# Patient Record
Sex: Female | Born: 1960
Health system: Southern US, Community
[De-identification: ages and names within clinical notes are randomized; demographics above are authoritative.]

## PROBLEM LIST (undated history)

## (undated) DIAGNOSIS — Z87898 Personal history of other specified conditions: Secondary | ICD-10-CM

## (undated) DIAGNOSIS — I10 Essential (primary) hypertension: Secondary | ICD-10-CM

## (undated) DIAGNOSIS — F32A Depression, unspecified: Secondary | ICD-10-CM

## (undated) DIAGNOSIS — F329 Major depressive disorder, single episode, unspecified: Secondary | ICD-10-CM

## (undated) DIAGNOSIS — D649 Anemia, unspecified: Secondary | ICD-10-CM

## (undated) DIAGNOSIS — Y843 Shock therapy as the cause of abnormal reaction of the patient, or of later complication, without mention of misadventure at the time of the procedure: Secondary | ICD-10-CM

## (undated) DIAGNOSIS — T7840XA Allergy, unspecified, initial encounter: Secondary | ICD-10-CM

## (undated) DIAGNOSIS — G709 Myoneural disorder, unspecified: Secondary | ICD-10-CM

## (undated) DIAGNOSIS — F419 Anxiety disorder, unspecified: Secondary | ICD-10-CM

## (undated) DIAGNOSIS — K219 Gastro-esophageal reflux disease without esophagitis: Secondary | ICD-10-CM

## (undated) DIAGNOSIS — J45909 Unspecified asthma, uncomplicated: Secondary | ICD-10-CM

## (undated) DIAGNOSIS — E785 Hyperlipidemia, unspecified: Secondary | ICD-10-CM

## (undated) DIAGNOSIS — F4310 Post-traumatic stress disorder, unspecified: Secondary | ICD-10-CM

## (undated) DIAGNOSIS — K5792 Diverticulitis of intestine, part unspecified, without perforation or abscess without bleeding: Secondary | ICD-10-CM

## (undated) HISTORY — PX: OTHER SURGICAL HISTORY: SHX169

## (undated) HISTORY — DX: Essential (primary) hypertension: I10

## (undated) HISTORY — DX: Myoneural disorder, unspecified: G70.9

## (undated) HISTORY — DX: Personal history of other specified conditions: Z87.898

## (undated) HISTORY — DX: Diverticulitis of intestine, part unspecified, without perforation or abscess without bleeding: K57.92

## (undated) HISTORY — PX: CHOLECYSTECTOMY: SHX55

## (undated) HISTORY — DX: Anemia, unspecified: D64.9

## (undated) HISTORY — DX: Gastro-esophageal reflux disease without esophagitis: K21.9

## (undated) HISTORY — DX: Depression, unspecified: F32.A

## (undated) HISTORY — PX: ABDOMINAL HYSTERECTOMY: SHX81

## (undated) HISTORY — PX: COLONOSCOPY: SHX174

## (undated) HISTORY — DX: Hyperlipidemia, unspecified: E78.5

## (undated) HISTORY — DX: Anxiety disorder, unspecified: F41.9

## (undated) HISTORY — DX: Shock therapy as the cause of abnormal reaction of the patient, or of later complication, without mention of misadventure at the time of the procedure: Y84.3

## (undated) HISTORY — PX: UTERINE FIBROID SURGERY: SHX826

## (undated) HISTORY — PX: COLECTOMY: SHX59

## (undated) HISTORY — DX: Post-traumatic stress disorder, unspecified: F43.10

## (undated) HISTORY — DX: Unspecified asthma, uncomplicated: J45.909

## (undated) HISTORY — DX: Allergy, unspecified, initial encounter: T78.40XA

## (undated) HISTORY — DX: Major depressive disorder, single episode, unspecified: F32.9

## (undated) HISTORY — PX: UPPER GASTROINTESTINAL ENDOSCOPY: SHX188

## (undated) HISTORY — PX: TUBAL LIGATION: SHX77

---

## 1995-07-30 ENCOUNTER — Encounter: Payer: Self-pay | Admitting: Gastroenterology

## 1995-08-02 ENCOUNTER — Encounter: Payer: Self-pay | Admitting: Internal Medicine

## 1997-12-28 ENCOUNTER — Other Ambulatory Visit: Admission: RE | Admit: 1997-12-28 | Discharge: 1997-12-28 | Payer: Self-pay | Admitting: Obstetrics and Gynecology

## 1998-02-16 ENCOUNTER — Other Ambulatory Visit: Admission: RE | Admit: 1998-02-16 | Discharge: 1998-02-16 | Payer: Self-pay | Admitting: *Deleted

## 1998-06-21 ENCOUNTER — Other Ambulatory Visit: Admission: RE | Admit: 1998-06-21 | Discharge: 1998-06-21 | Payer: Self-pay | Admitting: Obstetrics and Gynecology

## 1999-04-18 ENCOUNTER — Encounter: Payer: Self-pay | Admitting: Emergency Medicine

## 1999-04-18 ENCOUNTER — Emergency Department (HOSPITAL_COMMUNITY): Admission: EM | Admit: 1999-04-18 | Discharge: 1999-04-18 | Payer: Self-pay | Admitting: Emergency Medicine

## 1999-08-14 ENCOUNTER — Encounter: Payer: Self-pay | Admitting: Internal Medicine

## 1999-08-31 ENCOUNTER — Encounter: Payer: Self-pay | Admitting: Internal Medicine

## 1999-08-31 ENCOUNTER — Ambulatory Visit (HOSPITAL_COMMUNITY): Admission: RE | Admit: 1999-08-31 | Discharge: 1999-08-31 | Payer: Self-pay | Admitting: Internal Medicine

## 1999-11-06 ENCOUNTER — Encounter: Payer: Self-pay | Admitting: Internal Medicine

## 1999-11-24 ENCOUNTER — Other Ambulatory Visit: Admission: RE | Admit: 1999-11-24 | Discharge: 1999-11-24 | Payer: Self-pay | Admitting: Obstetrics and Gynecology

## 2000-04-22 ENCOUNTER — Inpatient Hospital Stay (HOSPITAL_COMMUNITY): Admission: EM | Admit: 2000-04-22 | Discharge: 2000-04-24 | Payer: Self-pay | Admitting: *Deleted

## 2000-12-19 ENCOUNTER — Other Ambulatory Visit: Admission: RE | Admit: 2000-12-19 | Discharge: 2000-12-19 | Payer: Self-pay | Admitting: *Deleted

## 2001-04-23 ENCOUNTER — Ambulatory Visit (HOSPITAL_COMMUNITY): Admission: RE | Admit: 2001-04-23 | Discharge: 2001-04-23 | Payer: Self-pay

## 2002-09-24 ENCOUNTER — Encounter: Payer: Self-pay | Admitting: Internal Medicine

## 2002-10-19 ENCOUNTER — Encounter: Payer: Self-pay | Admitting: Obstetrics and Gynecology

## 2002-10-19 ENCOUNTER — Encounter: Admission: RE | Admit: 2002-10-19 | Discharge: 2002-10-19 | Payer: Self-pay | Admitting: Obstetrics and Gynecology

## 2002-10-19 ENCOUNTER — Other Ambulatory Visit: Admission: RE | Admit: 2002-10-19 | Discharge: 2002-10-19 | Payer: Self-pay | Admitting: Obstetrics and Gynecology

## 2002-10-27 ENCOUNTER — Encounter: Payer: Self-pay | Admitting: Internal Medicine

## 2003-01-13 ENCOUNTER — Emergency Department (HOSPITAL_COMMUNITY): Admission: EM | Admit: 2003-01-13 | Discharge: 2003-01-13 | Payer: Self-pay | Admitting: Emergency Medicine

## 2003-02-17 ENCOUNTER — Inpatient Hospital Stay (HOSPITAL_COMMUNITY): Admission: AD | Admit: 2003-02-17 | Discharge: 2003-02-19 | Payer: Self-pay | Admitting: Obstetrics and Gynecology

## 2003-02-17 ENCOUNTER — Encounter (INDEPENDENT_AMBULATORY_CARE_PROVIDER_SITE_OTHER): Payer: Self-pay

## 2003-07-05 ENCOUNTER — Ambulatory Visit (HOSPITAL_COMMUNITY): Admission: RE | Admit: 2003-07-05 | Discharge: 2003-07-05 | Payer: Self-pay | Admitting: Internal Medicine

## 2003-08-12 ENCOUNTER — Encounter (INDEPENDENT_AMBULATORY_CARE_PROVIDER_SITE_OTHER): Payer: Self-pay | Admitting: *Deleted

## 2003-08-12 ENCOUNTER — Ambulatory Visit (HOSPITAL_COMMUNITY): Admission: RE | Admit: 2003-08-12 | Discharge: 2003-08-12 | Payer: Self-pay | Admitting: Internal Medicine

## 2003-08-14 ENCOUNTER — Emergency Department (HOSPITAL_COMMUNITY): Admission: EM | Admit: 2003-08-14 | Discharge: 2003-08-15 | Payer: Self-pay | Admitting: Emergency Medicine

## 2003-11-17 ENCOUNTER — Encounter: Payer: Self-pay | Admitting: Internal Medicine

## 2003-12-31 ENCOUNTER — Encounter: Admission: RE | Admit: 2003-12-31 | Discharge: 2003-12-31 | Payer: Self-pay | Admitting: Obstetrics and Gynecology

## 2004-08-09 ENCOUNTER — Ambulatory Visit: Payer: Self-pay | Admitting: Internal Medicine

## 2004-09-27 ENCOUNTER — Ambulatory Visit: Payer: Self-pay | Admitting: Internal Medicine

## 2004-10-04 ENCOUNTER — Ambulatory Visit: Payer: Self-pay | Admitting: Endocrinology

## 2004-10-16 ENCOUNTER — Ambulatory Visit: Payer: Self-pay | Admitting: Endocrinology

## 2004-11-11 ENCOUNTER — Ambulatory Visit: Payer: Self-pay | Admitting: Internal Medicine

## 2004-11-11 ENCOUNTER — Observation Stay (HOSPITAL_COMMUNITY): Admission: EM | Admit: 2004-11-11 | Discharge: 2004-11-12 | Payer: Self-pay | Admitting: Emergency Medicine

## 2004-11-12 ENCOUNTER — Ambulatory Visit: Payer: Self-pay | Admitting: Cardiology

## 2004-11-29 ENCOUNTER — Ambulatory Visit: Payer: Self-pay | Admitting: Endocrinology

## 2004-12-04 ENCOUNTER — Ambulatory Visit: Payer: Self-pay | Admitting: Endocrinology

## 2004-12-08 ENCOUNTER — Ambulatory Visit (HOSPITAL_COMMUNITY): Admission: RE | Admit: 2004-12-08 | Discharge: 2004-12-08 | Payer: Self-pay | Admitting: Endocrinology

## 2005-04-13 ENCOUNTER — Encounter: Admission: RE | Admit: 2005-04-13 | Discharge: 2005-04-13 | Payer: Self-pay | Admitting: Obstetrics and Gynecology

## 2005-07-23 ENCOUNTER — Ambulatory Visit: Payer: Self-pay | Admitting: Endocrinology

## 2005-10-12 ENCOUNTER — Encounter (INDEPENDENT_AMBULATORY_CARE_PROVIDER_SITE_OTHER): Payer: Self-pay | Admitting: *Deleted

## 2005-10-12 ENCOUNTER — Ambulatory Visit: Payer: Self-pay | Admitting: Cardiology

## 2005-10-12 ENCOUNTER — Ambulatory Visit: Payer: Self-pay | Admitting: Endocrinology

## 2006-05-07 ENCOUNTER — Ambulatory Visit: Payer: Self-pay | Admitting: *Deleted

## 2006-05-07 ENCOUNTER — Ambulatory Visit: Payer: Self-pay | Admitting: Endocrinology

## 2006-08-26 ENCOUNTER — Ambulatory Visit: Payer: Self-pay | Admitting: Endocrinology

## 2006-08-26 LAB — CONVERTED CEMR LAB
AST: 21 units/L (ref 0–37)
Albumin: 3.8 g/dL (ref 3.5–5.2)
Basophils Absolute: 0.1 10*3/uL (ref 0.0–0.1)
Bilirubin, Direct: 0.1 mg/dL (ref 0.0–0.3)
Calcium: 10.2 mg/dL (ref 8.4–10.5)
Chloride: 102 meq/L (ref 96–112)
Cholesterol: 254 mg/dL (ref 0–200)
Direct LDL: 125.9 mg/dL
Eosinophils Absolute: 0.4 10*3/uL (ref 0.0–0.6)
GFR calc Af Amer: 77 mL/min
GFR calc non Af Amer: 64 mL/min
Glucose, Bld: 96 mg/dL (ref 70–99)
HDL: 45.6 mg/dL (ref >39.0)
Hemoglobin, Urine: NEGATIVE
Ketones, ur: NEGATIVE mg/dL
Lymphocytes Relative: 27.1 % (ref 12.0–46.0)
MCHC: 35.7 g/dL (ref 30.0–36.0)
MCV: 82.2 fL (ref 78.0–100.0)
Neutro Abs: 6.6 10*3/uL (ref 1.4–7.7)
Neutrophils Relative %: 62.6 % (ref 43.0–77.0)
Platelets: 321 10*3/uL (ref 150–400)
RBC: 4.65 M/uL (ref 3.87–5.11)
Sodium: 141 meq/L (ref 135–145)
Total CHOL/HDL Ratio: 5.6
Triglycerides: 566 mg/dL (ref 0–149)
Urine Glucose: NEGATIVE mg/dL

## 2007-01-07 ENCOUNTER — Ambulatory Visit (HOSPITAL_COMMUNITY): Admission: RE | Admit: 2007-01-07 | Discharge: 2007-01-07 | Payer: Self-pay | Admitting: Obstetrics and Gynecology

## 2007-01-13 ENCOUNTER — Encounter: Admission: RE | Admit: 2007-01-13 | Discharge: 2007-01-13 | Payer: Self-pay | Admitting: Obstetrics and Gynecology

## 2007-02-04 ENCOUNTER — Ambulatory Visit: Payer: Self-pay | Admitting: Endocrinology

## 2007-02-10 ENCOUNTER — Ambulatory Visit: Payer: Self-pay | Admitting: Endocrinology

## 2007-02-10 ENCOUNTER — Encounter: Admission: RE | Admit: 2007-02-10 | Discharge: 2007-02-10 | Payer: Self-pay | Admitting: Endocrinology

## 2007-02-10 LAB — CONVERTED CEMR LAB
BUN: 11 mg/dL (ref 6–23)
Creatinine, Ser: 1 mg/dL (ref 0.4–1.2)

## 2007-02-12 ENCOUNTER — Encounter: Admission: RE | Admit: 2007-02-12 | Discharge: 2007-02-12 | Payer: Self-pay | Admitting: Endocrinology

## 2007-02-13 ENCOUNTER — Ambulatory Visit: Payer: Self-pay | Admitting: Endocrinology

## 2007-02-14 ENCOUNTER — Encounter: Payer: Self-pay | Admitting: Endocrinology

## 2007-07-22 ENCOUNTER — Encounter: Admission: RE | Admit: 2007-07-22 | Discharge: 2007-07-22 | Payer: Self-pay | Admitting: Obstetrics and Gynecology

## 2007-08-27 ENCOUNTER — Other Ambulatory Visit (HOSPITAL_COMMUNITY): Admission: RE | Admit: 2007-08-27 | Discharge: 2007-09-12 | Payer: Self-pay | Admitting: Psychiatry

## 2007-08-28 ENCOUNTER — Ambulatory Visit: Payer: Self-pay | Admitting: Psychiatry

## 2007-09-17 ENCOUNTER — Ambulatory Visit: Payer: Self-pay | Admitting: Endocrinology

## 2007-09-17 DIAGNOSIS — F329 Major depressive disorder, single episode, unspecified: Secondary | ICD-10-CM

## 2007-09-17 DIAGNOSIS — K219 Gastro-esophageal reflux disease without esophagitis: Secondary | ICD-10-CM

## 2007-09-17 DIAGNOSIS — I10 Essential (primary) hypertension: Secondary | ICD-10-CM | POA: Insufficient documentation

## 2007-09-17 DIAGNOSIS — E669 Obesity, unspecified: Secondary | ICD-10-CM

## 2007-09-17 DIAGNOSIS — F419 Anxiety disorder, unspecified: Secondary | ICD-10-CM

## 2007-09-17 DIAGNOSIS — L708 Other acne: Secondary | ICD-10-CM

## 2007-09-17 DIAGNOSIS — E785 Hyperlipidemia, unspecified: Secondary | ICD-10-CM | POA: Insufficient documentation

## 2007-09-17 LAB — CONVERTED CEMR LAB
AST: 23 units/L (ref 0–37)
Basophils Absolute: 0.1 10*3/uL (ref 0.0–0.1)
Bilirubin, Direct: 0.2 mg/dL (ref 0.0–0.3)
Chloride: 105 meq/L (ref 96–112)
Cholesterol: 181 mg/dL (ref 0–200)
Creatinine, Ser: 0.9 mg/dL (ref 0.4–1.2)
Direct LDL: 76.1 mg/dL
Eosinophils Absolute: 0.3 10*3/uL (ref 0.0–0.6)
Eosinophils Relative: 3.7 % (ref 0.0–5.0)
GFR calc non Af Amer: 72 mL/min
Glucose, Bld: 98 mg/dL (ref 70–99)
HCT: 38.1 % (ref 36.0–46.0)
Hemoglobin, Urine: NEGATIVE
Hemoglobin: 13 g/dL (ref 12.0–15.0)
Ketones, ur: NEGATIVE mg/dL
Lymphocytes Relative: 33.5 % (ref 12.0–46.0)
MCHC: 34 g/dL (ref 30.0–36.0)
MCV: 84.1 fL (ref 78.0–100.0)
Monocytes Absolute: 0.4 10*3/uL (ref 0.2–0.7)
Mucus, UA: NEGATIVE
Neutrophils Relative %: 56.5 % (ref 43.0–77.0)
Nitrite: NEGATIVE
Potassium: 4.1 meq/L (ref 3.5–5.1)
RBC: 4.54 M/uL (ref 3.87–5.11)
Sodium: 139 meq/L (ref 135–145)
TSH: 0.65 microintl units/mL (ref 0.35–5.50)
Total Bilirubin: 1.5 mg/dL — ABNORMAL HIGH (ref 0.3–1.2)
Total CHOL/HDL Ratio: 4.9
Urobilinogen, UA: 0.2 (ref 0.0–1.0)
WBC: 8.3 10*3/uL (ref 4.5–10.5)

## 2007-09-26 ENCOUNTER — Encounter: Payer: Self-pay | Admitting: Endocrinology

## 2007-10-03 ENCOUNTER — Ambulatory Visit: Payer: Self-pay | Admitting: Endocrinology

## 2007-11-05 ENCOUNTER — Telehealth (INDEPENDENT_AMBULATORY_CARE_PROVIDER_SITE_OTHER): Payer: Self-pay | Admitting: *Deleted

## 2007-11-06 ENCOUNTER — Encounter: Payer: Self-pay | Admitting: Endocrinology

## 2008-03-24 ENCOUNTER — Telehealth: Payer: Self-pay | Admitting: Internal Medicine

## 2008-04-14 ENCOUNTER — Ambulatory Visit: Payer: Self-pay | Admitting: Internal Medicine

## 2008-04-14 DIAGNOSIS — J309 Allergic rhinitis, unspecified: Secondary | ICD-10-CM | POA: Insufficient documentation

## 2008-04-14 DIAGNOSIS — H9190 Unspecified hearing loss, unspecified ear: Secondary | ICD-10-CM | POA: Insufficient documentation

## 2008-04-20 ENCOUNTER — Encounter: Admission: RE | Admit: 2008-04-20 | Discharge: 2008-04-20 | Payer: Self-pay | Admitting: Obstetrics and Gynecology

## 2008-05-03 ENCOUNTER — Telehealth: Payer: Self-pay | Admitting: Internal Medicine

## 2008-08-23 ENCOUNTER — Telehealth (INDEPENDENT_AMBULATORY_CARE_PROVIDER_SITE_OTHER): Payer: Self-pay | Admitting: *Deleted

## 2008-10-14 ENCOUNTER — Telehealth: Payer: Self-pay | Admitting: Endocrinology

## 2008-11-18 ENCOUNTER — Ambulatory Visit: Payer: Self-pay | Admitting: Endocrinology

## 2008-11-23 ENCOUNTER — Ambulatory Visit: Payer: Self-pay | Admitting: Endocrinology

## 2008-12-07 LAB — CONVERTED CEMR LAB
AST: 31 units/L (ref 0–37)
Albumin: 4.1 g/dL (ref 3.5–5.2)
BUN: 13 mg/dL (ref 6–23)
Basophils Absolute: 0.1 10*3/uL (ref 0.0–0.1)
CO2: 29 meq/L (ref 19–32)
Direct LDL: 80.2 mg/dL
Eosinophils Absolute: 0.5 10*3/uL (ref 0.0–0.7)
Glucose, Bld: 101 mg/dL — ABNORMAL HIGH (ref 70–99)
HCT: 37.5 % (ref 36.0–46.0)
Hemoglobin: 13.1 g/dL (ref 12.0–15.0)
Lymphs Abs: 3.2 10*3/uL (ref 0.7–4.0)
MCHC: 34.9 g/dL (ref 30.0–36.0)
MCV: 87.6 fL (ref 78.0–100.0)
Monocytes Absolute: 0.7 10*3/uL (ref 0.1–1.0)
Neutro Abs: 6.3 10*3/uL (ref 1.4–7.7)
Potassium: 3.2 meq/L — ABNORMAL LOW (ref 3.5–5.1)
RDW: 12.6 % (ref 11.5–14.6)
Sodium: 139 meq/L (ref 135–145)
TSH: 2.27 microintl units/mL (ref 0.35–5.50)

## 2008-12-11 LAB — CONVERTED CEMR LAB
Hemoglobin, Urine: NEGATIVE
Urine Glucose: NEGATIVE mg/dL
Urobilinogen, UA: 0.2 (ref 0.0–1.0)

## 2008-12-27 ENCOUNTER — Ambulatory Visit: Payer: Self-pay | Admitting: Endocrinology

## 2009-03-21 ENCOUNTER — Telehealth: Payer: Self-pay | Admitting: Endocrinology

## 2009-07-07 ENCOUNTER — Telehealth: Payer: Self-pay | Admitting: Internal Medicine

## 2009-10-06 ENCOUNTER — Telehealth: Payer: Self-pay | Admitting: Internal Medicine

## 2009-12-02 ENCOUNTER — Encounter: Payer: Self-pay | Admitting: Endocrinology

## 2009-12-20 ENCOUNTER — Telehealth: Payer: Self-pay | Admitting: Internal Medicine

## 2009-12-29 ENCOUNTER — Encounter: Admission: RE | Admit: 2009-12-29 | Discharge: 2009-12-29 | Payer: Self-pay | Admitting: Obstetrics and Gynecology

## 2010-01-12 ENCOUNTER — Ambulatory Visit: Payer: Self-pay | Admitting: Endocrinology

## 2010-02-16 ENCOUNTER — Ambulatory Visit: Payer: Self-pay | Admitting: Internal Medicine

## 2010-02-16 ENCOUNTER — Telehealth: Payer: Self-pay | Admitting: Internal Medicine

## 2010-02-16 LAB — CONVERTED CEMR LAB
Alkaline Phosphatase: 33 units/L — ABNORMAL LOW (ref 39–117)
BUN: 14 mg/dL (ref 6–23)
Basophils Absolute: 0.1 10*3/uL (ref 0.0–0.1)
Bilirubin, Direct: 0.3 mg/dL (ref 0.0–0.3)
CO2: 28 meq/L (ref 19–32)
Calcium: 11.6 mg/dL — ABNORMAL HIGH (ref 8.4–10.5)
Chloride: 100 meq/L (ref 96–112)
Creatinine, Ser: 1.3 mg/dL — ABNORMAL HIGH (ref 0.4–1.2)
Eosinophils Absolute: 0.2 10*3/uL (ref 0.0–0.7)
Glucose, Bld: 104 mg/dL — ABNORMAL HIGH (ref 70–99)
Hemoglobin, Urine: NEGATIVE
Lymphocytes Relative: 22.9 % (ref 12.0–46.0)
MCHC: 34.5 g/dL (ref 30.0–36.0)
MCV: 87.8 fL (ref 78.0–100.0)
Monocytes Absolute: 1 10*3/uL (ref 0.1–1.0)
Neutrophils Relative %: 69.5 % (ref 43.0–77.0)
Nitrite: NEGATIVE
Platelets: 465 10*3/uL — ABNORMAL HIGH (ref 150.0–400.0)
RDW: 13.4 % (ref 11.5–14.6)
Specific Gravity, Urine: 1.02 (ref 1.000–1.030)
Total Protein: 8 g/dL (ref 6.0–8.3)
Urine Glucose: NEGATIVE mg/dL
Urobilinogen, UA: 0.2 (ref 0.0–1.0)

## 2010-03-13 ENCOUNTER — Ambulatory Visit: Payer: Self-pay | Admitting: Endocrinology

## 2010-03-13 DIAGNOSIS — M25519 Pain in unspecified shoulder: Secondary | ICD-10-CM | POA: Insufficient documentation

## 2010-03-13 LAB — CONVERTED CEMR LAB
Basophils Absolute: 0.2 10*3/uL — ABNORMAL HIGH (ref 0.0–0.1)
Eosinophils Absolute: 0.4 10*3/uL (ref 0.0–0.7)
HCT: 37.8 % (ref 36.0–46.0)
Hemoglobin: 12.9 g/dL (ref 12.0–15.0)
Lymphs Abs: 2.1 10*3/uL (ref 0.7–4.0)
MCHC: 34.3 g/dL (ref 30.0–36.0)
Monocytes Absolute: 0.7 10*3/uL (ref 0.1–1.0)
Neutro Abs: 9 10*3/uL — ABNORMAL HIGH (ref 1.4–7.7)
Platelets: 407 10*3/uL — ABNORMAL HIGH (ref 150.0–400.0)
RDW: 12.8 % (ref 11.5–14.6)
Sed Rate: 10 mm/hr (ref 0–22)
Total CK: 61 units/L (ref 7–177)

## 2010-04-10 ENCOUNTER — Ambulatory Visit: Payer: Self-pay | Admitting: Endocrinology

## 2010-04-11 ENCOUNTER — Ambulatory Visit: Payer: Self-pay | Admitting: Endocrinology

## 2010-04-12 LAB — CONVERTED CEMR LAB
BUN: 18 mg/dL (ref 6–23)
CO2: 28 meq/L (ref 19–32)
Calcium: 10.1 mg/dL (ref 8.4–10.5)
Creatinine, Ser: 1.4 mg/dL — ABNORMAL HIGH (ref 0.4–1.2)
GFR calc non Af Amer: 43 mL/min (ref >60)
Glucose, Bld: 101 mg/dL — ABNORMAL HIGH (ref 70–99)

## 2010-04-24 ENCOUNTER — Ambulatory Visit: Payer: Self-pay | Admitting: Endocrinology

## 2010-04-28 ENCOUNTER — Telehealth: Payer: Self-pay | Admitting: Internal Medicine

## 2010-05-12 ENCOUNTER — Telehealth: Payer: Self-pay | Admitting: Endocrinology

## 2010-05-29 ENCOUNTER — Telehealth: Payer: Self-pay | Admitting: Internal Medicine

## 2010-05-29 ENCOUNTER — Encounter (INDEPENDENT_AMBULATORY_CARE_PROVIDER_SITE_OTHER): Payer: Self-pay | Admitting: *Deleted

## 2010-05-30 ENCOUNTER — Telehealth (INDEPENDENT_AMBULATORY_CARE_PROVIDER_SITE_OTHER): Payer: Self-pay | Admitting: *Deleted

## 2010-05-30 ENCOUNTER — Ambulatory Visit: Payer: Self-pay | Admitting: Gastroenterology

## 2010-05-30 DIAGNOSIS — K644 Residual hemorrhoidal skin tags: Secondary | ICD-10-CM | POA: Insufficient documentation

## 2010-05-31 ENCOUNTER — Telehealth: Payer: Self-pay | Admitting: Nurse Practitioner

## 2010-06-06 ENCOUNTER — Telehealth (INDEPENDENT_AMBULATORY_CARE_PROVIDER_SITE_OTHER): Payer: Self-pay | Admitting: *Deleted

## 2010-06-18 ENCOUNTER — Encounter: Payer: Self-pay | Admitting: Endocrinology

## 2010-06-23 ENCOUNTER — Telehealth: Payer: Self-pay | Admitting: Internal Medicine

## 2010-06-28 ENCOUNTER — Encounter (INDEPENDENT_AMBULATORY_CARE_PROVIDER_SITE_OTHER): Payer: Self-pay | Admitting: *Deleted

## 2010-06-29 ENCOUNTER — Telehealth: Payer: Self-pay | Admitting: Endocrinology

## 2010-07-05 ENCOUNTER — Telehealth: Payer: Self-pay | Admitting: Internal Medicine

## 2010-07-06 ENCOUNTER — Telehealth: Payer: Self-pay | Admitting: Internal Medicine

## 2010-07-22 ENCOUNTER — Encounter: Payer: Self-pay | Admitting: Internal Medicine

## 2010-07-23 ENCOUNTER — Encounter: Payer: Self-pay | Admitting: Endocrinology

## 2010-07-30 LAB — CONVERTED CEMR LAB
BUN: 16 mg/dL (ref 6–23)
Basophils Relative: 0.8 % (ref 0.0–3.0)
Bilirubin, Direct: 0.2 mg/dL (ref 0.0–0.3)
CO2: 28 meq/L (ref 19–32)
Chloride: 102 meq/L (ref 96–112)
Cholesterol: 218 mg/dL — ABNORMAL HIGH (ref 0–200)
Creatinine, Ser: 1.1 mg/dL (ref 0.4–1.2)
Direct LDL: 94.2 mg/dL
Eosinophils Absolute: 0.5 10*3/uL (ref 0.0–0.7)
Hemoglobin, Urine: NEGATIVE
Lymphs Abs: 3.4 10*3/uL (ref 0.7–4.0)
MCHC: 34.9 g/dL (ref 30.0–36.0)
MCV: 85.9 fL (ref 78.0–100.0)
Monocytes Absolute: 0.7 10*3/uL (ref 0.1–1.0)
Neutrophils Relative %: 59.3 % (ref 43.0–77.0)
Nitrite: NEGATIVE
Platelets: 352 10*3/uL (ref 150.0–400.0)
TSH: 1.46 microintl units/mL (ref 0.35–5.50)
Total Protein, Urine: NEGATIVE mg/dL
Total Protein: 7.5 g/dL (ref 6.0–8.3)
Urobilinogen, UA: 0.2 (ref 0.0–1.0)

## 2010-08-01 ENCOUNTER — Telehealth: Payer: Self-pay | Admitting: Endocrinology

## 2010-08-01 ENCOUNTER — Ambulatory Visit
Admission: RE | Admit: 2010-08-01 | Discharge: 2010-08-01 | Payer: Self-pay | Source: Home / Self Care | Attending: Endocrinology | Admitting: Endocrinology

## 2010-08-01 DIAGNOSIS — J45909 Unspecified asthma, uncomplicated: Secondary | ICD-10-CM | POA: Insufficient documentation

## 2010-08-01 NOTE — Assessment & Plan Note (Signed)
Summary: COLD / SAE / CD   Vital Signs:  Patient profile:   50 year old female Height:      64 inches (162.56 cm) Weight:      169.50 pounds (77.05 kg) BMI:     29.20 O2 Sat:      99 % on Room air Temp:     98.7 degrees F (37.06 degrees C) oral Pulse rate:   103 / minute BP sitting:   122 / 84  (left arm) Cuff size:   large  Vitals Entered By: Brenton Grills MA (February 16, 2010 10:56 AM)  O2 Flow:  Room air CC: runny nose/nausea, vomiting/aj, URI symptoms Comments Pt is no longer tkaing Nortriptyline   Primary Care Provider:  Corwin Levins MD  CC:  runny nose/nausea, vomiting/aj, and URI symptoms.  History of Present Illness:  URI Symptoms      This is a 50 year old woman who presents with URI symptoms.  The symptoms began 12-24 hrs ago.  The severity is described as severe.  c/o severe nausea and vomitting x 24h - no abd pain, chest pain or headache - preceeded by runny nose and nasal congestion x 12h.  The patient reports nasal congestion and clear nasal discharge, but denies sore throat, dry cough, productive cough, earache, and sick contacts.  Associated symptoms include subjective fever, dyspnea, and vomiting (but no vomitting in last 4 hours).  The patient denies rash and diarrhea.  The patient also reports itchy watery eyes and severe fatigue.  The patient denies headache and muscle aches.  Patient concerned symptoms may be side effect of new depression med started 4 days ago (stopped yest pm at advice of on-call psyc doc)  Current Medications (verified): 1)  Nexium 40 Mg  Cpdr (Esomeprazole Magnesium) .... Take 1 By Mouth Qd 2)  Restoril 15 Mg  Caps (Temazepam) .... Take 1 By Mouth At Bedtime Prn 3)  Nortriptyline Hcl 75 Mg  Caps (Nortriptyline Hcl) .... Take 1 By Mouth Qd 4)  Doxycycline Hyclate 100 Mg  Tabs (Doxycycline Hyclate) .Marland Kitchen.. 1 Bid 5)  Xanax 1 Mg Tabs (Alprazolam) .Marland Kitchen.. 1 By Mouth 2-3 Times Daily 6)  Proair Hfa 108 (90 Base) Mcg/act Aers (Albuterol Sulfate) ....  Use As Directed 7)  Buspar 10 Mg Tabs (Buspirone Hcl) .Marland Kitchen.. 1 Q Am 8)  Nefazodone Hcl 100 Mg Tabs (Nefazodone Hcl) .... 4 Qpm 9)  Triamterene-Hctz 37.5-25 Mg Tabs (Triamterene-Hctz) .Marland Kitchen.. 1po Once Daily 10)  Klor-Con M10 10 Meq Cr-Tabs (Potassium Chloride Crys Cr) .Marland Kitchen.. 1 Qd 11)  Lithium Carbonate 300 Mg Cr-Tabs (Lithium Carbonate) .Marland Kitchen.. 1 Tablet Two Times A Day 12)  Perphenazine 4 Mg Tabs (Perphenazine) .Marland Kitchen.. 1 Once Daily 13)  Fenofibrate 54 Mg Tabs (Fenofibrate) .Marland Kitchen.. 1 Once Daily 14)  Phenelzine Sulfate 15 Mg Tabs (Phenelzine Sulfate) .Marland Kitchen.. 1 Tablet Three Times A Day  Allergies (verified): 1)  ! Penicillin 2)  ! * Ivp Dye 3)  ! Sulfa 4)  ! Darvon  Past History:  Past Medical History: Anxiety Depression GERD Hyperlipidemia Hypertension  Allergic rhinitis  MD roster: psyc - plovsky  Review of Systems       The patient complains of weight loss.  The patient denies hoarseness, chest pain, syncope, headaches, and melena.    Physical Exam  General:  alert, well-developed, well-nourished, and cooperative to examination.  looks mild-mod ill  Eyes:  vision grossly intact; pupils equal, round and reactive to light. mild conjunctivitis with tearing but lids normal.  Ears:  normal pinnae bilaterally, without erythema, swelling, or tenderness to palpation. TMs clear, without effusion, or cerumen impaction. Hearing grossly normal bilaterally  Mouth:  teeth and gums in good repair; mucous membranes moist, without lesions or ulcers. oropharynx clear without exudate, no erythema.  Neck:  supple, full ROM, no masses, no thyromegaly; no thyroid nodules or tenderness. no JVD or carotid bruits.   Lungs:  normal respiratory effort, no intercostal retractions or use of accessory muscles; normal breath sounds bilaterally - no crackles and no wheezes.    Heart:  normal rate, regular rhythm, no murmur, and no rub. BLE without edema. normal DP pulses and normal cap refill in all 4 extremities      Abdomen:  soft, non-tender, normal bowel sounds, no distention; no masses and no appreciable hepatomegaly or splenomegaly.   Neurologic:  alert & oriented X3 and cranial nerves II-XII symetrically intact.  strength normal in all extremities, sensation intact to light touch, and gait normal. speech fluent without dysarthria or aphasia; follows commands with good comprehension.  Skin:  no rashes, vesicles, ulcers, or erythema. No nodules or irregularity to palpation.  Psych:  Oriented X3, memory intact for recent and remote, normally interactive, good eye contact, not anxious appearing, not depressed appearing, and not agitated.      Impression & Recommendations:  Problem # 1:  NAUSEA AND VOMITING (ICD-787.01) unclear trigger - ?viral vs med effect - no pain, afeb and HD stable - exam with normal BS, no rebound treat symptoms of nausea (IM now and by mouth med as needed - erx done) check labs r/o infx - hold emperic abx unless infx identified stop psyc med (done last 12h, not to resume at this time) push clears to avoid dehydration - to go to ER or ROV if worse or not improved next 48h - pt understands and agrees to same Orders: Promethazine up to 50mg  (J2550) Admin of Therapeutic Inj  intramuscular or subcutaneous (16109) TLB-BMP (Basic Metabolic Panel-BMET) (80048-METABOL) TLB-CBC Platelet - w/Differential (85025-CBCD) TLB-Hepatic/Liver Function Pnl (80076-HEPATIC) TLB-TSH (Thyroid Stimulating Hormone) (84443-TSH) TLB-Udip w/ Micro (81001-URINE)  Problem # 2:  WEIGHT LOSS (ICD-783.21) pt noted signif unintentional weight loss (>10lbs) since last OV (just over 1 month ag)o - ?dehydration vs other - labs to look for med illness further eval once acute issues resolved or as indicated by labs - to f/u next 2 weeks with PCP on same Orders: TLB-BMP (Basic Metabolic Panel-BMET) (80048-METABOL) TLB-CBC Platelet - w/Differential (85025-CBCD) TLB-Hepatic/Liver Function Pnl  (80076-HEPATIC) TLB-TSH (Thyroid Stimulating Hormone) (84443-TSH) TLB-Udip w/ Micro (81001-URINE)  Complete Medication List: 1)  Nexium 40 Mg Cpdr (Esomeprazole magnesium) .... Take 1 by mouth qd 2)  Restoril 15 Mg Caps (Temazepam) .... Take 1 by mouth at bedtime prn 3)  Levaquin 500 Mg Tabs (Levofloxacin) .Marland Kitchen.. 1 by mouth once daily 4)  Xanax 1 Mg Tabs (Alprazolam) .Marland Kitchen.. 1 by mouth 2-3 times daily 5)  Proair Hfa 108 (90 Base) Mcg/act Aers (Albuterol sulfate) .... Use as directed 6)  Buspar 10 Mg Tabs (Buspirone hcl) .Marland Kitchen.. 1 q am 7)  Nefazodone Hcl 100 Mg Tabs (Nefazodone hcl) .... 4 qpm 8)  Triamterene-hctz 37.5-25 Mg Tabs (Triamterene-hctz) .Marland Kitchen.. 1po once daily 9)  Klor-con M10 10 Meq Cr-tabs (Potassium chloride crys cr) .Marland Kitchen.. 1 qd 10)  Lithium Carbonate 300 Mg Cr-tabs (Lithium carbonate) .Marland Kitchen.. 1 tablet two times a day 11)  Perphenazine 4 Mg Tabs (Perphenazine) .Marland Kitchen.. 1 once daily 12)  Fenofibrate 54 Mg Tabs (Fenofibrate) .Marland Kitchen.. 1 once  daily 13)  Phenelzine Sulfate 15 Mg Tabs (Phenelzine sulfate) .Marland Kitchen.. 1 tablet three times a day - on hold 8/17 14)  Promethazine Hcl 25 Mg Tabs (Promethazine hcl) .Marland Kitchen.. 1 by mouth every 4 hours as needed for nausea  Patient Instructions: 1)  it was good to see you today. 2)  phenergan - shot today and your prescription has been electronically submitted to your pharmacy. Please take as directed. Contact our office if you believe you're having problems with the medication(s).  3)  stay off phenelzine until further notice 4)  push clear liquids like gatorade and water or ginger ale/sprite until you are feeling better 5)  test(s) ordered today - your results will be posted on the phone tree for review in 48-72 hours from the time of test completion; call [phone] and enter your 9 digit MRN (listed above on this page, just below your name); if any changes need to be made or there are abnormal results, you will be contacted directly.  6)  if your symptoms continue to  worsen (pain, fever, etc), or if you are unable take anything by mouth (pills, fluids, etc), you should go to the emergency room for further evaluation and treatment.  Prescriptions: PROMETHAZINE HCL 25 MG TABS (PROMETHAZINE HCL) 1 by mouth every 4 hours as needed for nausea  #30 x 1   Entered and Authorized by:   Newt Lukes MD   Signed by:   Newt Lukes MD on 02/16/2010   Method used:   Electronically to        [facility] [id number]* (retail)       [address] [place], [place]  [address]       Ph: [phone]       Fax: [phone]   RxID:   [id number]    Medication Administration  Injection # 1:    Medication: Promethazine up to 50mg     Diagnosis: NAUSEA AND VOMITING (ICD-787.01)    Route: IM    Site: RUOQ gluteus    Exp Date: 05/2011    Lot #: [id number]    Mfr: Baxter    Comments: gave 25mg /6ml     Patient tolerated injection without complications    Given by: Brenton Grills MA (February 16, 2010 11:25 AM)  Orders Added: 1)  Promethazine up to 50mg  [J2550] 2)  Admin of Therapeutic Inj  intramuscular or subcutaneous [96372] 3)  TLB-BMP (Basic Metabolic Panel-BMET) [80048-METABOL] 4)  TLB-CBC Platelet - w/Differential [85025-CBCD] 5)  TLB-Hepatic/Liver Function Pnl [80076-HEPATIC] 6)  TLB-TSH (Thyroid Stimulating Hormone) [84443-TSH] 7)  TLB-Udip w/ Micro [81001-URINE] 8)  Est. Patient Level IV [67341]

## 2010-08-01 NOTE — Progress Notes (Signed)
Summary: Hemorrhoids   Phone Note Call from Patient Call back at Home Phone [phone]   Caller: Patient Call For: Dr. Marina Goodell Reason for Call: Talk to Nurse Summary of Call: would like to be advised about hemorrhoids while waiting for sch'ed ov Initial call taken by: Vallarie Mare,  May 29, 2010 10:44 AM  Follow-up for Phone Call        Pt. having problem with hemorrhoids.Her gyne. dr. had given her rectal cream  and advised Sitz bath and tucks and she recentlygot her mom's rx filled forAnusol [facility].C.Given appt. with the N.P. for a.m. Follow-up by: Teryl Lucy RN,  May 29, 2010 1:58 PM

## 2010-08-01 NOTE — Assessment & Plan Note (Signed)
Summary: RASH/ ALSO A REFERRAL /NWS   Vital Signs:  Patient profile:   50 year old female Height:      64 inches (162.56 cm) Weight:      172 pounds (78.18 kg) BMI:     29.63 O2 Sat:      97 % on Room air Temp:     99.2 degrees F (37.33 degrees C) oral Pulse rate:   112 / minute BP sitting:   122 / 84  (left arm) Cuff size:   large  Vitals Entered By: Brenton Grills MA (March 13, 2010 2:10 PM)  O2 Flow:  Room air CC: rash on right arm/pain in shoulders/referral to PT/pt is no longer taking Promethazine or Levaquin/aj   Primary Provider:  Corwin Levins MD  CC:  rash on right arm/pain in shoulders/referral to PT/pt is no longer taking Promethazine or Levaquin/aj.  History of Present Illness: pt states few mos of slight irritation of the skin of the right forearm (in the context of using an abrasive cloth).   she has associated itching now, x only 1 week.   hypercalcemia pt states 6 weeks of pain at the shoulders, and proximal arms, but no assoc numbness.    Current Medications (verified): 1)  Nexium 40 Mg  Cpdr (Esomeprazole Magnesium) .... Take 1 By Mouth Qd 2)  Restoril 15 Mg  Caps (Temazepam) .... Take 2 By Mouth At Bedtime Prn 3)  Levaquin 500 Mg Tabs (Levofloxacin) .Marland Kitchen.. 1 By Mouth Once Daily 4)  Xanax 1 Mg Tabs (Alprazolam) .Marland Kitchen.. 1 By Mouth 2-3 Times Daily 5)  Proair Hfa 108 (90 Base) Mcg/act Aers (Albuterol Sulfate) .... Use As Directed 6)  Buspar 10 Mg Tabs (Buspirone Hcl) .Marland Kitchen.. 1 Q Am 7)  Nefazodone Hcl 100 Mg Tabs (Nefazodone Hcl) .... 4 Qpm 8)  Triamterene-Hctz 37.5-25 Mg Tabs (Triamterene-Hctz) .Marland Kitchen.. 1po Once Daily 9)  Klor-Con M10 10 Meq Cr-Tabs (Potassium Chloride Crys Cr) .Marland Kitchen.. 1 Qd 10)  Lithium Carbonate 300 Mg Cr-Tabs (Lithium Carbonate) .Marland Kitchen.. 1 Tablet Two Times A Day 11)  Perphenazine 4 Mg Tabs (Perphenazine) .Marland Kitchen.. 1 Once Daily 12)  Fenofibrate 54 Mg Tabs (Fenofibrate) .Marland Kitchen.. 1 Once Daily 13)  Phenelzine Sulfate 15 Mg Tabs (Phenelzine Sulfate) .Marland Kitchen.. 1 Tablet Three  Times A Day - On Hold 8/17 14)  Promethazine Hcl 25 Mg Tabs (Promethazine Hcl) .Marland Kitchen.. 1 By Mouth Every 4 Hours As Needed For Nausea 15)  Xanax Xr 1 Mg Xr24h-Tab (Alprazolam) .... 3 Tablets By Mouth At Bedtime  Allergies (verified): 1)  ! Penicillin 2)  ! * Ivp Dye 3)  ! Sulfa 4)  ! Darvon  Past History:  Past Medical History: Last updated: 02/16/2010 Anxiety Depression GERD Hyperlipidemia Hypertension  Allergic rhinitis  MD roster: psyc - plovsky  Review of Systems  The patient denies fever.         denies n/v  Physical Exam  General:  normal appearance.   Msk:  strength at the upper arms is limited by pain, but there is no muscle tenderness. Skin:  right forearm: has moderate eczematous rash.   Additional Exam:  Parathyroid Hormone       20.7 pg/mL                  14.0-72.0   Calcium              [H]  11.8 mg/dL    Sed Rate  10 mm/hr                    0-22  Tests: (2) CBC Platelet w/Diff (CBCD)   White Cell Count     [H]  12.4 K/uL                   4.5-10.5   Red Cell Count            4.34 Mil/uL                 3.87-5.11   Hemoglobin                12.9 g/dL                   09.8-11.9   Hematocrit                37.8 %      (i discussed with dr Donell Beers)   Impression & Recommendations:  Problem # 1:  rash uncertain etiology  Problem # 2:  HYPERCALCEMIA (ICD-275.42) due to lithium  Problem # 3:  SHOULDER PAIN, BILATERAL (ICD-719.41) uncertain etiology  Medications Added to Medication List This Visit: 1)  Restoril 15 Mg Caps (Temazepam) .... Take 2 by mouth at bedtime prn 2)  Xanax Xr 1 Mg Xr24h-tab (Alprazolam) .... 3 tablets by mouth at bedtime 3)  Triamcinolone Acetonide 0.1 % Crea (Triamcinolone acetonide) .... Three times a day as needed for itching  Other Orders: T-Parathyroid Hormone, Intact w/ Calcium (14782-95621) TLB-Sedimentation Rate (ESR) (85652-ESR) TLB-CBC Platelet - w/Differential (85025-CBCD) TLB-CK Total  Only(Creatine Kinase/CPK) (82550-CK) Est. Patient Level IV (30865)  Patient Instructions: 1)  triamcinolone cream three times a day as needed for itching.   2)  blood tests are being ordered for you today.  please call [phone] to hear your test results. 3)  stop pravastatin on a trial basis. 4)  if no help, call us, so i can request a test of the muscle which is done at a neurologist's office. 5)  (update: i called pt:  i left message on ans machine.  i have discussed with dr Donell Beers.  reduce lithium to 300 mg once daily.  go to lab in 1-2 weeks for lithium level (311), and bmet (275.42).  call dr Donell Beers if you feel worse on the reduced lithium dosage). Prescriptions: TRIAMCINOLONE ACETONIDE 0.1 % CREA (TRIAMCINOLONE ACETONIDE) three times a day as needed for itching  #1 med tube x 1   Entered and Authorized by:   Minus Breeding MD   Signed by:   Minus Breeding MD on 03/13/2010   Method used:   Electronically to        [facility] [id number]* (retail)       [address] [place], [place]  [address]       Ph: [phone]       Fax: [phone]   RxID:   [id number]

## 2010-08-01 NOTE — Assessment & Plan Note (Signed)
Summary: discuss supplements/#/cd   Vital Signs:  Patient profile:   50 year old female Height:      64 inches (162.56 cm) Weight:      172 pounds (78.18 kg) BMI:     29.63 O2 Sat:      97 % on Room air Temp:     99.1 degrees F (37.28 degrees C) oral Pulse rate:   84 / minute BP sitting:   138 / 96  (left arm) Cuff size:   large  Vitals Entered By: Brenton Grills MA (April 24, 2010 2:05 PM)  O2 Flow:  Room air CC: Pt here to discuss medications, discuss reoccuring bumps on legs/aj Is Patient Diabetic? No Comments Pt is no longer taking Nefazodone, Postassium Chloride, Perphenazine, Phenelzine, or using Triamcinolone Cream/aj   Primary Provider:  Corwin Levins MD  CC:  Pt here to discuss medications and discuss reoccuring bumps on legs/aj.  History of Present Illness: pt says she wants to take a different type of med for her cholesterol.   pt states maxzide causes excessive urination.     Current Medications (verified): 1)  Nexium 40 Mg  Cpdr (Esomeprazole Magnesium) .... Take 1 By Mouth Qd 2)  Restoril 15 Mg  Caps (Temazepam) .... Take 3 By Mouth At Bedtime As Needed 3)  Xanax 1 Mg Tabs (Alprazolam) .Marland Kitchen.. 1 By Mouth Once Daily 4)  Proair Hfa 108 (90 Base) Mcg/act Aers (Albuterol Sulfate) .... Use As Directed 5)  Buspirone Hcl 10 Mg Tabs (Buspirone Hcl) .... 2 Tablets By Mouth in The Am 2 Tablets By Mouth At Night 6)  Nefazodone Hcl 100 Mg Tabs (Nefazodone Hcl) .... 4 Qpm 7)  Triamterene-Hctz 37.5-25 Mg Tabs (Triamterene-Hctz) .Marland Kitchen.. 1po Once Daily 8)  Klor-Con M10 10 Meq Cr-Tabs (Potassium Chloride Crys Cr) .Marland Kitchen.. 1 Qd 9)  Lithium Carbonate 300 Mg Cr-Tabs (Lithium Carbonate) .Marland Kitchen.. 1 Tablet By Mouth Once Daily 10)  Perphenazine 4 Mg Tabs (Perphenazine) .Marland Kitchen.. 1 Once Daily 11)  Fenofibrate 54 Mg Tabs (Fenofibrate) .Marland Kitchen.. 1 Once Daily 12)  Phenelzine Sulfate 15 Mg Tabs (Phenelzine Sulfate) .Marland Kitchen.. 1 Tablet Three Times A Day - On Hold 8/17 13)  Xanax Xr 1 Mg Xr24h-Tab (Alprazolam) .... 3  Tablets By Mouth At Bedtime 14)  Triamcinolone Acetonide 0.1 % Crea (Triamcinolone Acetonide) .... Three Times A Day As Needed For Itching 15)  Doxycycline Hyclate 100 Mg Caps (Doxycycline Hyclate) .Marland Kitchen.. 1 By Mouth in Am and 1 By Mouth in Pm 16)  Viibryd 40 Mg Tabs (Vilazodone Hcl) .Marland Kitchen.. 1 By Mouth Once Daily  Allergies (verified): 1)  ! Penicillin 2)  ! * Ivp Dye 3)  ! Sulfa 4)  ! Darvon  Past History:  Past Medical History: Last updated: 02/16/2010 Anxiety Depression GERD Hyperlipidemia Hypertension  Allergic rhinitis  MD roster: psyc - plovsky  Review of Systems       right shoulder pain is improved, off the pravachol  Physical Exam  General:  obese.  no distress  Extremities:  no edema   Impression & Recommendations:  Problem # 1:  HYPERTENSION (ICD-401.9) needs increased rx  Problem # 2:  HYPERLIPIDEMIA (ICD-272.4) needs increased rx therapy is limited by perceived drug intolerance  Medications Added to Medication List This Visit: 1)  Restoril 15 Mg Caps (Temazepam) .... Take 3 by mouth at bedtime as needed 2)  Xanax 1 Mg Tabs (Alprazolam) .Marland Kitchen.. 1 by mouth once daily 3)  Buspirone Hcl 10 Mg Tabs (Buspirone hcl) .... 2 tablets by  mouth in the am 2 tablets by mouth at night 4)  Lithium Carbonate 300 Mg Cr-tabs (Lithium carbonate) .Marland Kitchen.. 1 tablet by mouth once daily 5)  Doxycycline Hyclate 100 Mg Caps (Doxycycline hyclate) .Marland Kitchen.. 1 by mouth in am and 1 by mouth in pm 6)  Viibryd 40 Mg Tabs (Vilazodone hcl) .Marland Kitchen.. 1 by mouth once daily 7)  Colestipol Hcl 1 Gm Tabs (Colestipol hcl) .... 5 tabs once daily. 8)  Losartan Potassium-hctz 50-12.5 Mg Tabs (Losartan potassium-hctz) .Marland Kitchen.. 1 tab once daily  Other Orders: Est. Patient Level III (16109)  Patient Instructions: 1)  take colestipol 5x1 gram once daily (with a meal, but not along with any other medication). 2)  change triamterene/hctz, to hyzaar, 1/day 3)  come in for a "wellness" appointment in approx 3 mos.    Prescriptions: COLESTIPOL HCL 1 GM TABS (COLESTIPOL HCL) 5 tabs once daily.  #150 x 11   Entered and Authorized by:   Minus Breeding MD   Signed by:   Minus Breeding MD on 04/24/2010   Method used:   Electronically to        [facility] [address] [id number]* (retail)       [address] [place], [place]  [address]       Ph: [phone]       Fax: [phone]   RxID:   [id number]    Orders Added: 1)  Est. Patient Level III [27253]

## 2010-08-01 NOTE — Progress Notes (Signed)
Summary: REQ FOR RX  Phone Note Call from Patient Call back at [facility] Phone [phone]   Summary of Call: Patient is requesting rx for muscle relaxer. Pt thinks she strained a muscle in her lower back while moving things in the trunk of her car.  Initial call taken by: Lamar Sprinkles, CMA,  April 28, 2010 9:25 AM  Follow-up for Phone Call        i sent rx Follow-up by: Minus Breeding MD,  April 28, 2010 10:40 AM  Additional Follow-up for Phone Call Additional follow up Details #1::        Pt informed Additional Follow-up by: Margaret Pyle, CMA,  April 28, 2010 10:47 AM    New/Updated Medications: CHLORZOXAZONE 500 MG TABS (CHLORZOXAZONE) 1 tab three times a day as needed for muscle spasms Prescriptions: CHLORZOXAZONE 500 MG TABS (CHLORZOXAZONE) 1 tab three times a day as needed for muscle spasms  #50 x 1   Entered and Authorized by:   Minus Breeding MD   Signed by:   Minus Breeding MD on 04/28/2010   Method used:   Electronically to        [facility] [id number]* (retail)       [address] [place], [place]  [address]       Ph: [phone]       Fax: [phone]   RxID:   [id number]

## 2010-08-01 NOTE — Progress Notes (Signed)
Summary: Req for change  Phone Note From Pharmacy   Caller: [facility] [phone] Summary of Call: [facility] refill of triam/hctz to be changed to tablets b/c caps are not available. [facility].  Initial call taken by: Lamar Sprinkles, CMA,  December 20, 2009 4:14 PM  Follow-up for Phone Call        ok for tabs - done escript Follow-up by: Corwin Levins MD,  December 20, 2009 4:16 PM    New/Updated Medications: TRIAMTERENE-HCTZ 37.5-25 MG TABS (TRIAMTERENE-HCTZ) 1po once daily Prescriptions: TRIAMTERENE-HCTZ 37.5-25 MG TABS (TRIAMTERENE-HCTZ) 1po once daily  #90 x 3   Entered and Authorized by:   Corwin Levins MD   Signed by:   Corwin Levins MD on 12/20/2009   Method used:   Electronically to        [facility] [address] [id number]* (retail)       [address] [place], [place]  [address]       Ph: [phone]       Fax: [phone]   RxID:   [id number]

## 2010-08-01 NOTE — Letter (Signed)
Summary: [facility]  [facility]   Imported By: Sherian Rein 12/09/2009 13:34:12  _____________________________________________________________________  External Attachment:    Type:   Image     Comment:   External Document

## 2010-08-01 NOTE — Assessment & Plan Note (Signed)
Summary: flu shot/sae/cd   Nurse Visit   Allergies: 1)  ! Penicillin 2)  ! * Ivp Dye 3)  ! Sulfa 4)  ! Darvon  Orders Added: 1)  Admin 1st Vaccine [90471] 2)  Flu Vaccine 60yrs + [16010] Flu Vaccine Consent Questions     Do you have a history of severe allergic reactions to this vaccine? no    Any prior history of allergic reactions to egg and/or gelatin? no    Do you have a sensitivity to the preservative Thimersol? no    Do you have a past history of Guillan-Barre Syndrome? no    Do you currently have an acute febrile illness? no    Have you ever had a severe reaction to latex? no    Vaccine information given and explained to patient? yes    Are you currently pregnant? no    Lot Number:[id number]   Exp Date:12/30/2010   Site Given  Left Deltoid IMu Vaccine 90yrs + [93235] .lbflu

## 2010-08-01 NOTE — Progress Notes (Signed)
Summary: Questions   Phone Note Call from Patient Call back at Home Phone [phone]   Caller: Patient Call For: Gunnar Fusi Reason for Call: Talk to Nurse Summary of Call: Pt is calling back about her hemorrhoids and the possiblity of them being banded Initial call taken by: [place] Johnson,  May 31, 2010 12:43 PM  Follow-up for Phone Call        Pt is calling back again today about her hemorrhoids Follow-up by: [place] Johnson,  June 01, 2010 9:15 AM  Additional Follow-up for Phone Call Additional follow up Details #1::        Pt just wanted [place] to know that she is going to use the Lidocaine jelly and Proctosol Creme.  She is hoping the hemorrhoids will shrink enough that she will not have to have anything done surgically. Additional Follow-up by: Joselyn Glassman,  June 01, 2010 12:02 PM     Appended Document: Questions Faxed Paula's office note to [facility] for Dr. Mignon Pine.  Pt has appt on 06-16-10.

## 2010-08-01 NOTE — Progress Notes (Signed)
Summary: rx chg  Phone Note Refill Request Message from:  Patient on May 12, 2010 3:21 PM  Refills Requested: Medication #1:  LOSARTAN POTASSIUM-HCTZ 50-12.5 MG TABS 1 tab once daily   Dosage confirmed as above?Dosage Confirmed pt called to have refill of Losartan sent to [facility] on [address]. Would like 3 mth supply.  She also wants to know if she can change from Doxycycline to Minocin because it's cheaper.  Initial call taken by: Alysia Penna,  May 12, 2010 3:23 PM  Follow-up for Phone Call        to robin Follow-up by: Corwin Levins MD,  May 12, 2010 3:33 PM  Additional Follow-up for Phone Call Additional follow up Details #1::        Need dosage insttrucitons on Minocin and how to take Additional Follow-up by: Robin Ewing CMA Duncan Dull),  May 12, 2010 3:53 PM    Additional Follow-up for Phone Call Additional follow up Details #2::    I decline as I have not seen pt since oct 2009  I believe Dr Everardo All is actually her PCP  please ask Dr Everardo All - and change PCP  Follow-up by: Corwin Levins MD,  May 12, 2010 4:15 PM  Prescriptions: Claris Gladden POTASSIUM-HCTZ 50-12.5 MG TABS (LOSARTAN POTASSIUM-HCTZ) 1 tab once daily  #90 x 1   Entered by:   Alysia Penna   Authorized by:   Corwin Levins MD   Signed by:   Alysia Penna on 05/12/2010   Method used:   Electronically to        [facility] [address] #[id number]* (retail)       [address] [place], [place]  [address]       Ph: [phone]       Fax: [phone]   RxID:   [id number]

## 2010-08-01 NOTE — Procedures (Signed)
Summary: [facility] COLON   Colonoscopy  Procedure date:  10/27/2002  Findings:      Location:  [facility].   Patient Name: Tamara, [name] MRN:  Procedure Procedures: Colonoscopy CPT: 32951.  Personnel: Endoscopist: Wilhemina Bonito. Marina Goodell, MD.  Exam Location: Exam performed in Outpatient Clinic. Outpatient  Patient Consent: Procedure, Alternatives, Risks and Benefits discussed, consent obtained, from patient. Consent was obtained by the RN.  Indications  Evaluation of: Anemia with low iron saturation. Microcytic.  History  Pre-Exam Physical: Performed Oct 27, 2002. Entire physical exam was normal.  Exam Exam: Extent of exam reached: Terminal Ileum, extent intended: Terminal Ileum.  The cecum was identified by appendiceal orifice and IC valve. Patient position: on left side. Colon retroflexion performed. Images taken. ASA Classification: II. Tolerance: excellent.  Monitoring: Pulse and BP monitoring, Oximetry used. Supplemental O2 given.  Colon Prep Used Golytely for colon prep. Prep results: excellent.  Sedation Meds: Patient assessed and found to be appropriate for moderate (conscious) sedation. Residual sedation present from prior procedure today.  Fentanyl 100 mcg. given IV. Versed 10 mg. given IV.  Findings NORMAL EXAM: Terminal Ileum to Rectum. Comments: internal hemorrhoids present.   Assessment Normal examination.  Events  Unplanned Interventions: No intervention was required.  Unplanned Events: There were no complications. Plans Medication Plan: Iron: 324mg  TID,   Disposition: After procedure patient sent to recovery. After recovery patient sent home.  Comments: Return to the care of Dr. Everardo All who will monitor blood counts  cc: Sean A. Everardo All  This report was created from the original endoscopy report, which was reviewed and signed by the above listed endoscopist.

## 2010-08-01 NOTE — Progress Notes (Signed)
Summary: Appt w/ [facility], Dr. Harvie Junior   Phone Note Outgoing Call   Call placed by: Joselyn Glassman,  May 30, 2010 11:22 AM Call placed to: Specialist Summary of Call: Called [facility] and spoke to [place].  Made pt Tamara [name] appt to see Dr. Harvie Junior on 06-16-2010. She is arrive at 9Am for a 9:20 Appt.  Called pt at [phone] to inform her.  Judy at [facility] also was not sure if the pt's ins company, [employer], is in Dietitian for [facility].  I advised pt to call her ins company to ask them. Initial call taken by: Joselyn Glassman,  May 30, 2010 11:25 AM

## 2010-08-01 NOTE — Letter (Signed)
Summary: New Patient letter  [facility]  [address] [place], [place] [address]   Phone: [phone]  Fax: [phone]       05/29/2010 MRN: [id number]  Tamara [name] [address] [place], [place]  [address]  Dear Ms. Heather,  Welcome to the Gastroenterology Division at [facility].    You are scheduled to see Dr. Marina Goodell on 07/11/2010 at 11:00AM on the 3rd floor at [facility], [address].  We ask that you try to arrive at our office 15 minutes prior to your appointment time to allow for check-in.  We would like you to complete the enclosed self-administered evaluation form prior to your visit and bring it with you on the day of your appointment.  We will review it with you.  Also, please bring a complete list of all your medications or, if you prefer, bring the medication bottles and we will list them.  Please bring your insurance card so that we may make a copy of it.  If your insurance requires a referral to see a specialist, please bring your referral form from your primary care physician.  Co-payments are due at the time of your visit and may be paid by cash, check or credit card.     Your office visit will consist of a consult with your physician (includes a physical exam), any laboratory testing he/she may order, scheduling of any necessary diagnostic testing (e.g. x-ray, ultrasound, CT-scan), and scheduling of a procedure (e.g. Endoscopy, Colonoscopy) if required.  Please allow enough time on your schedule to allow for any/all of these possibilities.    If you cannot keep your appointment, please call [phone] to cancel or reschedule prior to your appointment date.  This allows [place] the opportunity to schedule an appointment for another patient in need of care.  If you do not cancel or reschedule by 5 p.m. the business day prior to your appointment date, you will be charged a $50.00 late cancellation/no-show fee.    Thank you for choosing [facility] for your medical needs.  We appreciate the opportunity to care for you.  Please visit [place] at our website  to learn more about our practice.                     Sincerely,                                                             The Gastroenterology Division

## 2010-08-01 NOTE — Assessment & Plan Note (Signed)
Summary: hemorrhoids...as./Dr.Perry pt.    History of Present Illness Visit Type: Initial Visit Primary GI MD: Yancey Flemings MD Primary Provider: Corwin Levins MD Chief Complaint: hemorrhoids History of Present Illness:   Patient seen several years ago by Dr. Marina Goodell for history of GERD / esophageal stricture and anemia workup. Her GERD symptoms have been controlled on Nexium.     Patient is here today for evaluation of hemorrhoids. On Thanksgiving day patient, after several hours of standing patient developed rectal pain and subsequently noticed that she had a hemorrhoid. Bowel movements had been completely normal. No rectal bleeding. Has used Proctosol (mother had extra prescription) for 4 days,Tucks and taking sitz baths. She hasn't had any improvement despite these measures.    GI Review of Systems      Denies abdominal pain, acid reflux, belching, bloating, chest pain, dysphagia with liquids, dysphagia with solids, heartburn, loss of appetite, nausea, vomiting, vomiting blood, weight loss, and  weight gain.      Reports hemorrhoids and  rectal pain.     Denies anal fissure, black tarry stools, change in bowel habit, constipation, diarrhea, diverticulosis, fecal incontinence, heme positive stool, irritable bowel syndrome, jaundice, light color stool, liver problems, and  rectal bleeding.    Current Medications (verified): 1)  Nexium 40 Mg  Cpdr (Esomeprazole Magnesium) .... Take 1 By Mouth Qd 2)  Restoril 15 Mg  Caps (Temazepam) .... Take 3 By Mouth At Bedtime As Needed 3)  Xanax 1 Mg Tabs (Alprazolam) .Marland Kitchen.. 1 By Mouth Once Daily 4)  Proair Hfa 108 (90 Base) Mcg/act Aers (Albuterol Sulfate) .... Use As Directed 5)  Buspirone Hcl 10 Mg Tabs (Buspirone Hcl) .... 2 Tablets By Mouth in The Am 2 Tablets By Mouth At Night 6)  Lithium Carbonate 300 Mg Cr-Tabs (Lithium Carbonate) .Marland Kitchen.. 1 Tablet By Mouth Once Daily 7)  Fenofibrate 54 Mg Tabs (Fenofibrate) .Marland Kitchen.. 1 Once Daily 8)  Xanax Xr 1 Mg  Xr24h-Tab (Alprazolam) .... 3 Tablets By Mouth At Bedtime 9)  Doxycycline Hyclate 100 Mg Caps (Doxycycline Hyclate) .Marland Kitchen.. 1 By Mouth in Am and 1 By Mouth in Pm 10)  Viibryd 40 Mg Tabs (Vilazodone Hcl) .Marland Kitchen.. 1 By Mouth Once Daily 11)  Colestipol Hcl 1 Gm Tabs (Colestipol Hcl) .... 5 Tabs Once Daily. 12)  Losartan Potassium-Hctz 50-12.5 Mg Tabs (Losartan Potassium-Hctz) .Marland Kitchen.. 1 Tab Once Daily 13)  Chlorzoxazone 500 Mg Tabs (Chlorzoxazone) .Marland Kitchen.. 1 Tab Three Times A Day As Needed For Muscle Spasms  Allergies (verified): 1)  ! Penicillin 2)  ! * Ivp Dye 3)  ! Sulfa 4)  ! Darvon  Past History:  Past Medical History: Reviewed history from 02/16/2010 and no changes required. Anxiety Depression GERD Hyperlipidemia Hypertension  Allergic rhinitis  MD roster: psyc - plovsky  Past Surgical History: Reviewed history from 09/17/2007 and no changes required. Cholecystectomy Hysterectomy Tubal ligation  Family History: Reviewed history from 10/03/2007 and no changes required. no cancer  Social History: Reviewed history from 01/12/2010 and no changes required. disabled divorced lives with boyfriend (since 2002)  Review of Systems  The patient denies allergy/sinus, anemia, anxiety-new, arthritis/joint pain, back pain, blood in urine, breast changes/lumps, change in vision, confusion, cough, coughing up blood, depression-new, fainting, fatigue, fever, headaches-new, hearing problems, heart murmur, heart rhythm changes, itching, menstrual pain, muscle pains/cramps, night sweats, nosebleeds, pregnancy symptoms, shortness of breath, skin rash, sleeping problems, sore throat, swelling of feet/legs, swollen lymph glands, thirst - excessive , urination - excessive , urination changes/pain, urine leakage,  vision changes, and voice change.    Vital Signs:  Patient profile:   50 year old female Height:      64 inches Weight:      175 pounds BMI:     30.15 Pulse rate:   70 / minute Pulse rhythm:    regular BP sitting:   118 / 70  (left arm)  Vitals Entered By: Chales Abrahams CMA Duncan Dull) (May 30, 2010 9:04 AM)  Physical Exam  General:  Well developed, well nourished, no acute distress. Head:  Normocephalic and atraumatic. Eyes:  Conjunctiva pink, no icterus.  Lungs:  Clear throughout to auscultation. Heart:  Regular rate and rhythm; no murmurs, rubs,  or bruits. Abdomen:  Abdomen soft, nontender, nondistended. No obvious masses or hepatomegaly.Normal bowel sounds.  Rectal:  Large hemorrhoid, irreducible. It is firm but not hard.  Not blue to suggest thrombosis.  Msk:  Symmetrical with no gross deformities. Normal posture. Neurologic:  Alert and  oriented x4;  grossly normal neurologically. Skin:  Intact without significant lesions or rashes. Psych:  Alert and cooperative. Normal mood and affect.   Impression & Recommendations:  Problem # 1:  HEMORRHOIDS-EXTERNAL (ICD-455.3) Assessment New Large, irreducible (Grade IV) internal hemorrhoid. Patient has self-treated for 4 days with no improvement.  Grade IV hemorrhoids not amenable to banding. She isn't interested in surgery but agrees to surgical evaluation to discuss options.  For now she should then continue sitz baths and Proctosol. Will try Xylocaine jelly for discomfort. Constipation has not been an issue (despite psychiatric medications and Colestid) but patient understands it is a risk factor for hemorrhoids.    Patient Instructions: 1)  We sent prescriptions for Proctosol cream and zylocaine (Lidocaine) jelly for rectal pain. 2)  We will call [facility] and make you an appointment to be seen and will call you with the appointment. 3)  Continue the Sitz baths daily. 4)  Try to avoid constipation and you can use stool softners.  5)  Copy sent to : Corwin Levins, MD 6)  The medication list was reviewed and reconciled.  All changed / newly prescribed medications were explained.  A complete medication list was  provided to the patient / caregiver. Prescriptions: PROCTOSOL HC 2.5 % CREA (HYDROCORTISONE) Use rectally for rectal pain for hemorrhoids  #1 tube x 0   Entered by:   Lowry Ram NCMA   Authorized by:   Willette Cluster NP   Signed by:   Lowry Ram NCMA on 05/30/2010   Method used:   Electronically to        [facility] [address] #[id number]* (retail)       [address] [place], [place]  [address]       Ph: [phone]       Fax: [phone]   RxID:   [id number] XYLOCAINE JELLY 2 % GEL (LIDOCAINE HCL) Use rectally 3 times daily  #30 cc x 1   Entered by:   Lowry Ram NCMA   Authorized by:   Willette Cluster NP   Signed by:   Lowry Ram NCMA on 05/30/2010   Method used:   Electronically to        [facility] [address] #[id number]* (retail)       [address] [place], [place]  [address]       Ph: [phone]       Fax: [phone]   RxID:  [id number]  

## 2010-08-01 NOTE — Progress Notes (Signed)
----   Converted from flag ---- ---- 05/30/2010 11:19 AM, Lowry Ram NCMA wrote: Fax notes to Dr Mignon Pine at [facility] for Tamara [name]. Her appt with him is 06-16-10. ------------------------------  Faxed notes to Dr. Mignon Pine on 06-02-2010.

## 2010-08-01 NOTE — Progress Notes (Signed)
  Phone Note Other Incoming   Caller: dr Donell Beers Summary of Call: interaction between zocor and serzone  Follow-up for Phone Call        please call patient: stop zocor (simvastatin) needs f/u appt < 30 days Follow-up by: Minus Breeding MD,  October 06, 2009 1:18 PM  Additional Follow-up for Phone Call Additional follow up Details #1::        Pharmacy advised and message left on pt's home vm  with above information. pt asked to contact office. Additional Follow-up by: Margaret Pyle, CMA,  October 06, 2009 1:40 PM    Additional Follow-up for Phone Call Additional follow up Details #2::    pt informed of interaction Follow-up by: Margaret Pyle, CMA,  October 06, 2009 4:14 PM

## 2010-08-01 NOTE — Progress Notes (Signed)
Summary: refill   Phone Note Call from Patient Call back at Home Phone [phone]   Caller: Patient Summary of Call: pt called requesting refills of Nexium to [facility] Initial call taken by: Margaret Pyle, CMA,  July 07, 2009 11:59 AM    Prescriptions: NEXIUM 40 MG  CPDR (ESOMEPRAZOLE MAGNESIUM) TAKE 1 by mouth QD  #90 x 0   Entered by:   Margaret Pyle, CMA   Authorized by:   Minus Breeding MD   Signed by:   Margaret Pyle, CMA on 07/07/2009   Method used:   Electronically to        [facility]* (mail-order)             ,          Ph: [phone]       Fax: [phone]   RxID:   [id number]

## 2010-08-01 NOTE — Assessment & Plan Note (Signed)
Summary: f/u appt/#/cd   Vital Signs:  Patient profile:   50 year old female Height:      64 inches (162.56 cm) Weight:      185 pounds (84.09 kg) BMI:     31.87 O2 Sat:      98 % on Room air Temp:     98.2 degrees F (36.78 degrees C) oral Pulse rate:   102 / minute BP sitting:   112 / 84  (left arm) Cuff size:   large  Vitals Entered By: Brenton Grills MA (January 12, 2010 11:37 AM)  O2 Flow:  Room air CC: F/U per pt/Refiil request on Nexium and Doxcycline/pt is taking Pezphenazine 7mg  1 tablet at noon/aj Comments Pt is no longer taking Xyzal, Gabapentin or Neurontin, Simvastatin, or using the Triaminolone cream   CC:  F/U per pt/Refiil request on Nexium and Doxcycline/pt is taking Pezphenazine 7mg  1 tablet at noon/aj.  History of Present Illness: pt says her depression is only slightly better since her ect at [employer].  zocor had to be stopped due to nefazodone. here for regular wellness examination.  she's feeling pretty well in general, and does not drink or smoke.   Current Medications (verified): 1)  Nexium 40 Mg  Cpdr (Esomeprazole Magnesium) .... Take 1 By Mouth Qd 2)  Restoril 15 Mg  Caps (Temazepam) .... Take 1 By Mouth At Bedtime Prn 3)  Nortriptyline Hcl 75 Mg  Caps (Nortriptyline Hcl) .... Take 1 By Mouth Qd 4)  Neurontin 300 Mg  Caps (Gabapentin) .... Qhs 5)  Doxycycline Hyclate 100 Mg  Tabs (Doxycycline Hyclate) .Marland Kitchen.. 1 Bid 6)  Xanax 1 Mg Tabs (Alprazolam) .Marland Kitchen.. 1 By Mouth 2-3 Times Daily 7)  Xyzal 5 Mg Tabs (Levocetirizine Dihydrochloride) .Marland Kitchen.. 1po Once Daily As Needed 8)  Proair Hfa 108 (90 Base) Mcg/act Aers (Albuterol Sulfate) .... Use As Directed 9)  Buspar 10 Mg Tabs (Buspirone Hcl) .Marland Kitchen.. 1 Q Am 10)  Gabapentin 100 Mg Caps (Gabapentin) .... 2 Qam and 2 Qpm 11)  Nefazodone Hcl 100 Mg Tabs (Nefazodone Hcl) .... 4 Qpm 12)  Simvastatin 80 Mg Tabs (Simvastatin) .Marland Kitchen.. 1 Tab Qd 13)  Triamterene-Hctz 37.5-25 Mg Tabs (Triamterene-Hctz) .Marland Kitchen.. 1po Once Daily 14)  Klor-Con  M10 10 Meq Cr-Tabs (Potassium Chloride Crys Cr) .Marland Kitchen.. 1 Qd 15)  Triamcinolone Acetonide 0.025 % Crea (Triamcinolone Acetonide) .... Three Times A Day As Needed Itching 16)  Lithium Carbonate 300 Mg Cr-Tabs (Lithium Carbonate) .Marland Kitchen.. 1 Tablet Two Times A Day  Allergies (verified): 1)  ! Penicillin 2)  ! * Ivp Dye 3)  ! Sulfa 4)  ! Darvon  Past History:  Past Medical History: Last updated: 04/14/2008 Anxiety Depression GERD Hyperlipidemia Hypertension Allergic rhinitis  Family History: Reviewed history from 10/03/2007 and no changes required. no cancer  Social History: Reviewed history from 12/27/2008 and no changes required. disabled divorced lives with boyfriend (since 2002)  Review of Systems  The patient denies fever, weight loss, weight gain, decreased hearing, chest pain, syncope, dyspnea on exertion, prolonged cough, headaches, abdominal pain, melena, hematochezia, severe indigestion/heartburn, hematuria, and suspicious skin lesions.    Physical Exam  General:  obese.  no distress  Head:  head: no deformity eyes: no periorbital swelling, no proptosis external nose and ears are normal mouth: no lesion seen Neck:  Supple without thyroid enlargement or tenderness.  Breasts:  sees gyn  Lungs:  Clear to auscultation bilaterally. Normal respiratory effort.  Heart:  Regular rate and rhythm without murmurs or  gallops noted. Normal S1,S2.   Abdomen:  abdomen is soft, nontender.  no hepatosplenomegaly.   not distended.  no hernia  Rectal:  sees gyn  Genitalia:  sees gyn  Msk:  muscle bulk and strength are grossly normal.  no obvious joint swelling.  gait is normal and steady  Pulses:  dorsalis pedis intact bilat.  no carotid bruit  Extremities:  no deformity.  no ulcer on the feet.  feet are of normal color and temp.  no edema  Neurologic:  cn 2-12 grossly intact.   readily moves all 4's.   sensation is intact to touch on the feet  Skin:  normal texture and  temp.  no rash.  not diaphoretic  Cervical Nodes:  No significant adenopathy.  Psych:  Alert and cooperative; normal mood and affect; normal attention span and concentration.     Impression & Recommendations:  Problem # 1:  ROUTINE GENERAL MEDICAL EXAM@HEALTH  CARE FACL (ICD-V70.0)  Problem # 2:  HYPERLIPIDEMIA (ICD-272.4) needs increased rx  Medications Added to Medication List This Visit: 1)  Lithium Carbonate 300 Mg Cr-tabs (Lithium carbonate) .Marland Kitchen.. 1 tablet two times a day 2)  Perphenazine 4 Mg Tabs (Perphenazine) .Marland Kitchen.. 1 once daily 3)  Pravastatin Sodium 80 Mg Tabs (Pravastatin sodium) .Marland Kitchen.. 1 at bedtime 4)  Fenofibrate 54 Mg Tabs (Fenofibrate) .Marland Kitchen.. 1 once daily  Other Orders: EKG w/ Interpretation (93000) TLB-Lipid Panel (80061-LIPID) TLB-BMP (Basic Metabolic Panel-BMET) (80048-METABOL) TLB-CBC Platelet - w/Differential (85025-CBCD) TLB-Hepatic/Liver Function Pnl (80076-HEPATIC) TLB-TSH (Thyroid Stimulating Hormone) (84443-TSH) TLB-Udip w/ Micro (81001-URINE) Est. Patient 40-64 years (38182)  Preventive Care Screening     gyn is dr Rosalio Macadamia   Patient Instructions: 1)  please consider these measures for your health:  minimize alcohol.  do not use tobacco products.  have a colonoscopy at least every 10 years from age 82.  keep firearms safely stored.  always use seat belts.  have working smoke alarms in your home.  see the dentist regularly.  never drive under the influence of alcohol or drugs (including prescription drugs).  those with fair skin should take precautions against the sun. 2)  it is critically important to prevent falling down (keep floor areas well-lit, dry, and free of loose objects). 3)  blood tests are being ordered for you today.  please call [phone] to hear your test results. 4)  take pravastatin 80 mg at bedtime. 5)  in 4-6 weeks, go to lab for repeat tsh v58.69, lipid 272.0, and liver v58.69. 6)  (update: i left message on phone-tree:  change pravachol  to fenofibrate 54 mg once daily, and do labs as above). Prescriptions: FENOFIBRATE 54 MG TABS (FENOFIBRATE) 1 once daily  #30 x 11   Entered and Authorized by:   Minus Breeding MD   Signed by:   Minus Breeding MD on 01/12/2010   Method used:   Electronically to        [facility] [address] #[id number]* (retail)       [address] [place], [place]  [address]       Ph: [phone]       Fax: [phone]   RxID:   [id number] DOXYCYCLINE HYCLATE 100 MG  TABS (DOXYCYCLINE HYCLATE) 1 bid  #180 x 3   Entered and Authorized by:   Minus Breeding MD   Signed by:   Minus Breeding MD on 01/12/2010   Method used:   Electronically to  [facility] [address] #[id number]* (retail)       [address] [place], [place]  [address]       Ph: [phone]       Fax: [phone]   RxID:   [id number] NEXIUM 40 MG  CPDR (ESOMEPRAZOLE MAGNESIUM) TAKE 1 by mouth QD  #90 x 3   Entered and Authorized by:   Minus Breeding MD   Signed by:   Minus Breeding MD on 01/12/2010   Method used:   Electronically to        [facility] [address] #[id number]* (retail)       [address] [place], [place]  [address]       Ph: [phone]       Fax: [phone]   RxID:   [id number] PRAVASTATIN SODIUM 80 MG TABS (PRAVASTATIN SODIUM) 1 at bedtime  #30 x 11   Entered and Authorized by:   Minus Breeding MD   Signed by:   Minus Breeding MD on 01/12/2010   Method used:   Electronically to        [facility] #[id number]* (retail)       [address] [place], [place]  [address]       Ph: [phone]       Fax: [phone]   RxID:   [id number] PRAVASTATIN SODIUM 80 MG TABS (PRAVASTATIN SODIUM) 1 at bedtime  #30 x 11   Entered and Authorized by:   Minus Breeding MD   Signed by:   Minus Breeding MD on 01/12/2010   Method used:   Electronically to        [facility] [phone]* (retail)       [address] [place], [place]  [address]       Ph:  [phone]       Fax: [phone]   RxID:   [id number]

## 2010-08-01 NOTE — Progress Notes (Signed)
Summary: Pharmacy Change  Phone Note Call from Patient Call back at Home Phone [phone]   Initial call taken by: Margaret Pyle, CMA,  February 16, 2010 4:51 PM    Prescriptions: PROMETHAZINE HCL 25 MG TABS (PROMETHAZINE HCL) 1 by mouth every 4 hours as needed for nausea  #30 x 1   Entered by:   Margaret Pyle, CMA   Authorized by:   Newt Lukes MD   Signed by:   Margaret Pyle, CMA on 02/16/2010   Method used:   Electronically to        [id number]* (retail)       [address] [place], [place]  [address]       Ph: [phone]       Fax: [phone]   RxID:   [id number]

## 2010-08-01 NOTE — Procedures (Signed)
Summary: [facility] EGD   EGD  Procedure date:  10/27/2002  Findings:      Location: [facility]   Patient Name: Tamara [name], Tamara [name] MRN:  Procedure Procedures: Panendoscopy (EGD) CPT: 43235.    with biopsy(s)/brushing(s). CPT: D1846139.  Personnel: Endoscopist: Wilhemina Bonito. Marina Goodell, MD.  Referred By: Cleophas Dunker Everardo All, MD.  Exam Location: Exam performed in Outpatient Clinic. Outpatient  Patient Consent: Procedure, Alternatives, Risks and Benefits discussed, consent obtained, from patient. Consent was obtained by the RN.  Indications  Evaluation of: Anemia,  with low iron saturation. Microcytic.  History  Pre-Exam Physical: Performed Oct 27, 2002  Entire physical exam was normal.  Exam Exam Info: Maximum depth of insertion Duodenum, intended Duodenum. Patient position: on left side. Vocal cords visualized. Gastric retroflexion performed. Images taken. ASA Classification: II. Tolerance: excellent.  Sedation Meds: Patient assessed and found to be appropriate for moderate (conscious) sedation. Fentanyl 100 mcg. given IV. Versed 10 mg. given IV.  Monitoring: BP and pulse monitoring done. Oximetry used. Supplemental O2 given  Findings STRICTURE / STENOSIS: Stricture in Distal Esophagus.  Etiology: benign due to reflux. 37 cm from mouth. ICD9: Esophageal Stricture: 530.3.  Normal: Cardia to Duodenal 2nd Portion. Comments: Bx post bulbar duodenum taken to r/o sprue.   Assessment Abnormal examination, see findings above.  Diagnoses: 530.3: Esophageal Stricture.   Events  Unplanned Intervention: No unplanned interventions were required.  Unplanned Events: There were no complications. Plans Disposition: After procedure patient sent to recovery. After recovery patient sent home.  Comments: Proceed to colonoscopy  cc: Sean A. Everardo All  This report was created from the original endoscopy report, which was reviewed and signed by the above listed endoscopist.

## 2010-08-03 NOTE — Progress Notes (Signed)
Summary: Rx req  Phone Note Call from Patient Call back at Home Phone [phone]   Caller: Patient Summary of Call: Pt requests new Rx for Doxy. Rx written today was for #90 x 3 but pt takes medicine two times a day. Please advise Initial call taken by: Margaret Pyle, CMA,  July 06, 2010 2:10 PM  Follow-up for Phone Call        i printed Follow-up by: Minus Breeding MD,  July 06, 2010 3:07 PM  Additional Follow-up for Phone Call Additional follow up Details #1::        Rx in cabinet for pt pick up Additional Follow-up by: Margaret Pyle, CMA,  July 06, 2010 3:59 PM    Prescriptions: DOXYCYCLINE HYCLATE 100 MG CAPS (DOXYCYCLINE HYCLATE) 1 by mouth in AM and 1 by mouth in PM  #180 x 3   Entered and Authorized by:   Minus Breeding MD   Signed by:   Minus Breeding MD on 07/06/2010   Method used:   Print then Give to Patient   RxID:   [id number]

## 2010-08-03 NOTE — Progress Notes (Signed)
Summary: RFs- Mail order   Phone Note Call from Patient Call back at Home Phone [phone]   Summary of Call: Patient is requesting 90 day supply of all meds - needs hard copies to pick up.  Initial call taken by: Lamar Sprinkles, CMA,  June 23, 2010 4:52 PM  Follow-up for Phone Call        ok - to [facility] for routine Follow-up by: Corwin Levins MD,  June 23, 2010 4:58 PM  Additional Follow-up for Phone Call Additional follow up Details #1::        okay to fill Xanax, restoril and Lithium? Additional Follow-up by: Margaret Pyle, CMA,  June 27, 2010 10:08 AM    Additional Follow-up for Phone Call Additional follow up Details #2::    no, I dont see where we have done these meds here in the past, and I believe she has a psychiatrist  you may want to verify with the pt Follow-up by: Corwin Levins MD,  June 27, 2010 12:53 PM  Additional Follow-up for Phone Call Additional follow up Details #3:: Details for Additional Follow-up Action Taken: called pt informed prescriptions are ready for pickup. Patient does get other prescriptions through her psychiatrist. Additional Follow-up by: Robin Ewing CMA Duncan Dull),  June 27, 2010 4:06 PM  Prescriptions: FENOFIBRATE 54 MG TABS (FENOFIBRATE) 1 once daily  #90 x 0   Entered by:   Margaret Pyle, CMA   Authorized by:   Corwin Levins MD   Signed by:   Margaret Pyle, CMA on 06/27/2010   Method used:   Print then Give to Patient   RxID:   [id number] COLESTIPOL HCL 1 GM TABS (COLESTIPOL HCL) 5 tabs once daily.  #150 x 3   Entered by:   Margaret Pyle, CMA   Authorized by:   Corwin Levins MD   Signed by:   Margaret Pyle, CMA on 06/27/2010   Method used:   Print then Give to Patient   RxID:   [id number] LOSARTAN POTASSIUM-HCTZ 50-12.5 MG TABS (LOSARTAN POTASSIUM-HCTZ) 1 tab once daily  #90 x 0   Entered by:   Margaret Pyle, CMA   Authorized by:   Corwin Levins MD  Signed by:   Margaret Pyle, CMA on 06/27/2010   Method used:   Print then Give to Patient   RxID:   [id number] BUSPIRONE HCL 10 MG TABS (BUSPIRONE HCL) 2 tablets by mouth in the AM 2 tablets by mouth at night  #0 x 0   Entered by:   Margaret Pyle, CMA   Authorized by:   Corwin Levins MD   Signed by:   Margaret Pyle, CMA on 06/27/2010   Method used:   Print then Give to Patient   RxID:   [id number] PROAIR HFA 108 (90 BASE) MCG/ACT AERS (ALBUTEROL SULFATE) use as directed  #3 x 0   Entered by:   Margaret Pyle, CMA   Authorized by:   Corwin Levins MD   Signed by:   Margaret Pyle, CMA on 06/27/2010   Method used:   Print then Give to Patient   RxID:   [id number] NEXIUM 40 MG  CPDR (ESOMEPRAZOLE MAGNESIUM) TAKE 1 by mouth QD  #90 x 0   Entered by:   Margaret Pyle, CMA   Authorized by:   Corwin Levins MD   Signed by:   Margaret Pyle, CMA on 06/27/2010   Method used:   Print then Give to Patient  RxID:   [id number]

## 2010-08-03 NOTE — Progress Notes (Signed)
Summary: REFILL REQ/SAE PT  Phone Note Refill Request Message from:  Patient on June 23, 2010 11:27 AM  Refills Requested: Medication #1:  CHLORZOXAZONE 500 MG TABS 1 tab three times a day as needed for muscle spasms   Dosage confirmed as above?Dosage Confirmed   Supply Requested: 1 month  Method Requested: Electronic Initial call taken by: Margaret Pyle, CMA,  June 23, 2010 11:28 AM  Follow-up for Phone Call        done per emr Follow-up by: Corwin Levins MD,  June 23, 2010 11:35 AM  Additional Follow-up for Phone Call Additional follow up Details #1::        pt advised Additional Follow-up by: Margaret Pyle, CMA,  June 23, 2010 12:45 PM    New/Updated Medications: CHLORZOXAZONE 500 MG TABS (CHLORZOXAZONE) 1 tab three times a day as needed for muscle spasms Prescriptions: CHLORZOXAZONE 500 MG TABS (CHLORZOXAZONE) 1 tab three times a day as needed for muscle spasms  #50 x 0   Entered and Authorized by:   Corwin Levins MD   Signed by:   Corwin Levins MD on 06/23/2010   Method used:   Electronically to        [facility] [address] [id number] (retail)       [address] [place], [place]  [address]       Ph: [phone]       Fax: [phone]   RxID:   [id number]

## 2010-08-03 NOTE — Letter (Signed)
Summary: New Patient letter  [facility]  [address] [place], [place] [address]   Phone: [phone]  Fax: [phone]       06/28/2010 MRN: [id number]  [facility] [address] [place], [place]  [address]  Dear Tamara [name],  Welcome to the Gastroenterology Division at [facility].    You are scheduled to see Dr.  Marina Goodell on 08-08-10 at 3:30p.m. on the 3rd floor at [facility], [address]  We ask that you try to arrive at our office 15 minutes prior to your appointment time to allow for check-in.  We would like you to complete the enclosed self-administered evaluation form prior to your visit and bring it with you on the day of your appointment.  We will review it with you.  Also, please bring a complete list of all your medications or, if you prefer, bring the medication bottles and we will list them.  Please bring your insurance card so that we may make a copy of it.  If your insurance requires a referral to see a specialist, please bring your referral form from your primary care physician.  Co-payments are due at the time of your visit and may be paid by cash, check or credit card.     Your office visit will consist of a consult with your physician (includes a physical exam), any laboratory testing he/she may order, scheduling of any necessary diagnostic testing (e.g. x-ray, ultrasound, CT-scan), and scheduling of a procedure (e.g. Endoscopy, Colonoscopy) if required.  Please allow enough time on your schedule to allow for any/all of these possibilities.    If you cannot keep your appointment, please call [phone] to cancel or reschedule prior to your appointment date.  This allows [place] the opportunity to schedule an appointment for another patient in need of care.  If you do not cancel or reschedule by 5 p.m. the business day prior to your appointment date, you will be charged a $50.00 late cancellation/no-show fee.    Thank you for choosing [facility] for your medical needs.  We appreciate the opportunity to care for you.  Please visit [place] at our website  to learn more about our practice.                     Sincerely,                                                             The Gastroenterology Division

## 2010-08-03 NOTE — Progress Notes (Signed)
Summary: Rx req  Phone Note Call from Patient Call back at Home Phone [phone]   Caller: Patient VM OK Summary of Call: Pt called requesting hard copy RX for Doxy to pick up, okay to print Rx? Initial call taken by: Margaret Pyle, CMA,  June 29, 2010 8:23 AM  Follow-up for Phone Call        I'm not sure about this, as I'm not sure if Dr Everardo All does this rx, or her dermatologist, or whether it is for the acne , or even if it should be once vs twice per day  I would hold for now on the rx, and ask Dr Everardo All if we are not sure Follow-up by: Corwin Levins MD,  June 29, 2010 12:53 PM  Additional Follow-up for Phone Call Additional follow up Details #1::        Pt advised via VM, will call back once SAE has returned to office Additional Follow-up by: Margaret Pyle, CMA,  June 29, 2010 2:44 PM    Additional Follow-up for Phone Call Additional follow up Details #2::    please call patient: it looks like dermatologist rxs this.  do you need f/u there? Follow-up by: Minus Breeding MD,  July 01, 2010 10:40 AM  Additional Follow-up for Phone Call Additional follow up Details #3:: Details for Additional Follow-up Action Taken: SAE last wrote Rx for Doxy #180 x # 01/12/2010 (First Rx was 10/09/2007). Pt is requesting Rx to send to mail order company per new [employer]. Margaret Pyle, CMA  July 05, 2010 10:38 AM   Prescriptions: DOXYCYCLINE HYCLATE 100 MG CAPS (DOXYCYCLINE HYCLATE) 1 by mouth in AM and 1 by mouth in PM  #90 x 3   Entered and Authorized by:   Minus Breeding MD   Signed by:   Minus Breeding MD on 07/05/2010   Method used:   Print then Give to Patient   RxID:   [id number]  what dx is this for? i need to know if dermatologist recommended this.  does she stilll see the dermatologist?  Per pt, Dermatologist recommended this for severe acne. Pt states she has not seen Derm since SAE started Rx'ing ABX. Margaret Pyle, CMA  July 05, 2010 1:03 PM   i printed  Pt informed via VM, Rx in cabinet for pt pick up Margaret Pyle, CMA  July 06, 2010 8:08 AM

## 2010-08-03 NOTE — Progress Notes (Signed)
Summary: medication clarification  Phone Note Other Incoming   Caller: [employer] Summary of Call: The patient has indicated they may be allergic to the medication Losartan HCT. Do you want to continue to dispense this medication?  fax response to [phone] Initial call taken by: Robin Ewing CMA Duncan Dull),  July 05, 2010 3:36 PM  Follow-up for Phone Call        no hx allergy by chart and has been stable for some time; ok to cont as is for now, unless she knows of specific allergy symptoms going on at this time Follow-up by: Corwin Levins MD,  July 05, 2010 3:51 PM  Additional Follow-up for Phone Call Additional follow up Details #1::        Informed [employer] of JWJ instructions. Additional Follow-up by: Robin Ewing CMA Duncan Dull),  July 05, 2010 4:19 PM

## 2010-08-08 ENCOUNTER — Telehealth: Payer: Self-pay | Admitting: Internal Medicine

## 2010-08-08 ENCOUNTER — Ambulatory Visit: Payer: Self-pay | Admitting: Internal Medicine

## 2010-08-09 NOTE — Progress Notes (Signed)
Summary: pt?  Phone Note Call from Patient Call back at [facility] Phone [phone]   Caller: Patient Summary of Call: Pt called stating she was advised to switch from Nexium 80mg  to Omeprazole 40. Pt is concerned that 40mg  of Omeprazole will not work as well as 80mg  of Nexium, pt is requesting to take omeprazole two times a day or 2 tabs once daily, please advise. Initial call taken by: Margaret Pyle, CMA,  August 01, 2010 9:44 AM  Follow-up for Phone Call        if you require that much, you should consider seeing a specialist. Follow-up by: Minus Breeding MD,  August 01, 2010 9:58 AM  Additional Follow-up for Phone Call Additional follow up Details #1::        Pt advised Additional Follow-up by: Margaret Pyle, CMA,  August 01, 2010 10:35 AM

## 2010-08-10 ENCOUNTER — Ambulatory Visit (INDEPENDENT_AMBULATORY_CARE_PROVIDER_SITE_OTHER): Payer: Medicare Other | Admitting: Internal Medicine

## 2010-08-10 ENCOUNTER — Encounter: Payer: Self-pay | Admitting: Internal Medicine

## 2010-08-10 DIAGNOSIS — F411 Generalized anxiety disorder: Secondary | ICD-10-CM

## 2010-08-10 DIAGNOSIS — K219 Gastro-esophageal reflux disease without esophagitis: Secondary | ICD-10-CM

## 2010-08-15 ENCOUNTER — Ambulatory Visit: Payer: Self-pay | Admitting: Endocrinology

## 2010-08-17 NOTE — Letter (Signed)
Summary: EGD Instructions  [facility]  [address] [place], [place] [address]   Phone: [phone]  Fax: [phone]       Jaelene Buttram    1961/05/28    MRN: [id number]       Procedure Day /Date:Friday, 08/25/10     Arrival Time: 3:00 pm      Procedure Time:4:00 pm     Location of Procedure:                    x [facility] (4th Floor)   PREPARATION FOR ENDOSCOPY with Propoful   On Friday, 08/25/10 THE DAY OF THE PROCEDURE:  1.   No solid foods, milk or milk products are allowed after midnight the night before your procedure.  2.   Do not drink anything colored red or purple.  Avoid juices with pulp.  No orange juice.  3.  You may drink clear liquids until 2:00 pm, which is 2 hours before your procedure.                                                                                                CLEAR LIQUIDS INCLUDE: Water Jello Ice Popsicles Tea (sugar ok, no milk/cream) Powdered fruit flavored drinks Coffee (sugar ok, no milk/cream) Gatorade Juice: apple, white grape, white cranberry  Lemonade Clear bullion, consomm, broth Carbonated beverages (any kind) Strained chicken noodle soup Hard Candy   MEDICATION INSTRUCTIONS  Unless otherwise instructed, you should take regular prescription medications with a small sip of water as early as possible the morning of your procedure.         OTHER INSTRUCTIONS  You will need a responsible adult at least 49 years of age to accompany you and drive you home.   This person must remain in the waiting room during your procedure.  Wear loose fitting clothing that is easily removed.  Leave jewelry and other valuables at home.  However, you may wish to bring a book to read or an iPod/MP3 player to listen to music as you wait for your procedure to start.  Remove all body piercing jewelry and leave at home.  Total time from sign-in until discharge is approximately 2-3 hours.  You should go  home directly after your procedure and rest.  You can resume normal activities the day after your procedure.  The day of your procedure you should not:   Drive   Make legal decisions   Operate machinery   Drink alcohol   Return to work  You will receive specific instructions about eating, activities and medications before you leave.    The above instructions have been reviewed and explained to me by   _______________________    I fully understand and can verbalize these instructions _____________________________ Date _________

## 2010-08-17 NOTE — Letter (Signed)
Summary: [employer], [employer]   Imported By: Sherian Rein 08/11/2010 08:45:20  _____________________________________________________________________  External Attachment:    Type:   Image     Comment:   External Document

## 2010-08-17 NOTE — Progress Notes (Signed)
Summary: [employer] [employer]   Imported By: Sherian Rein 08/11/2010 08:42:48  _____________________________________________________________________  External Attachment:    Type:   Image     Comment:   External Document

## 2010-08-17 NOTE — Procedures (Signed)
Summary: [employer]   Imported By: Sherian Rein 08/11/2010 08:35:04  _____________________________________________________________________  External Attachment:    Type:   Image     Comment:   External Document

## 2010-08-17 NOTE — Assessment & Plan Note (Signed)
Summary: WELCOME TO MEDICARE WELLNESS--STC   Vital Signs:  Patient profile:   50 year old female Height:      64 inches (162.56 cm) Weight:      166.50 pounds (75.68 kg) BMI:     28.68 O2 Sat:      99 % on Room air Temp:     98.5 degrees F (36.94 degrees C) oral Pulse rate:   83 / minute Pulse rhythm:   regular BP sitting:   10 / 80  (left arm) Cuff size:   large  Vitals Entered By: Brenton Grills CMA (AAMA) (August 01, 2010 8:03 AM)  O2 Flow:  Room air CC: Welcome to [employer] Wellness/? about Proair/aj, Lipid Management Is Patient Diabetic? No   Primary Provider:  Corwin Levins MD  CC:  Welcome to Medicare Wellness/? about Proair/aj and Lipid Management.  History of Present Illness: the status of at least 3 ongoing medical problems is addressed today: acne: pt says doxycycline, retin-a, and bactroban work well.   asthma:  pt says is worse recently. she was seen at urgent care.  she is improved, but wheezing persists gerd: pt says nexuim works well, but is too expensive.  Lipid Management History:      Positive NCEP/ATP III risk factors include hypertension.  Negative NCEP/ATP III risk factors include female age less than 61 years old.    Current Medications (verified): 1)  Nexium 40 Mg  Cpdr (Esomeprazole Magnesium) .... Take 1 By Mouth Qd 2)  Restoril 15 Mg  Caps (Temazepam) .... Take 3 By Mouth At Bedtime As Needed 3)  Xanax 1 Mg Tabs (Alprazolam) .Marland Kitchen.. 1 By Mouth Once Daily 4)  Proair Hfa 108 (90 Base) Mcg/act Aers (Albuterol Sulfate) .... Use As Directed 5)  Buspirone Hcl 10 Mg Tabs (Buspirone Hcl) .... 2 Tablets By Mouth in The Am 2 Tablets By Mouth At Night 6)  Lithium Carbonate 300 Mg Cr-Tabs (Lithium Carbonate) .Marland Kitchen.. 1 Tablet By Mouth Once Daily 7)  Fenofibrate 54 Mg Tabs (Fenofibrate) .Marland Kitchen.. 1 Once Daily 8)  Xanax Xr 1 Mg Xr24h-Tab (Alprazolam) .... 3 Tablets By Mouth At Bedtime 9)  Doxycycline Hyclate 100 Mg Caps (Doxycycline Hyclate) .Marland Kitchen.. 1 By Mouth in Am and 1 By  Mouth in Pm 10)  Viibryd 40 Mg Tabs (Vilazodone Hcl) .... 1/2 Tablet By Mouth Once Daily (20mg  11)  Colestipol Hcl 1 Gm Tabs (Colestipol Hcl) .... 5 Tabs Once Daily. 12)  Losartan Potassium-Hctz 50-12.5 Mg Tabs (Losartan Potassium-Hctz) .Marland Kitchen.. 1 Tab Once Daily 13)  Chlorzoxazone 500 Mg Tabs (Chlorzoxazone) .Marland Kitchen.. 1 Tab Three Times A Day As Needed For Muscle Spasms 14)  Xylocaine Jelly 2 % Gel (Lidocaine Hcl) .... Use Rectally 3 Times Daily 15)  Proctosol Hc 2.5 % Crea (Hydrocortisone) .... Use Rectally For Rectal Pain For Hemorrhoids  Allergies (verified): 1)  ! Penicillin 2)  ! * Ivp Dye 3)  ! Sulfa 4)  ! Darvon  Past History:  Past Medical History: Last updated: 02/16/2010 Anxiety Depression GERD Hyperlipidemia Hypertension  Allergic rhinitis  MD roster: psyc - plovsky  Review of Systems  The patient denies dyspnea on exertion.         she also has a dry cough.    Physical Exam  General:  obese.  no distress  Head:  head: no deformity eyes: no periorbital swelling, no proptosis external nose and ears are normal mouth: no lesion seen Lungs:  Clear to auscultation bilaterally. Normal respiratory effort.    Impression &  Recommendations:  Problem # 1:  GERD (ICD-530.81) well-controlled, but nexium is too expensive.  Problem # 2:  ASTHMA (ICD-493.90) Assessment: Deteriorated  Problem # 3:  ACNE, MILD (ICD-706.1) well-controlled  Medications Added to Medication List This Visit: 1)  Viibryd 40 Mg Tabs (Vilazodone hcl) .... 1/2 tablet by mouth once daily (20mg  2)  Benzonatate 200 Mg Caps (Benzonatate) .Marland Kitchen.. 1 tab three times a day as needed for cough 3)  Omeprazole 40 Mg Cpdr (Omeprazole) .Marland Kitchen.. 1 tab once daily 4)  Bactroban 2 % Oint (Mupirocin) .... Three times a day as needed for acne 5)  Advair Diskus 100-50 Mcg/dose Aepb (Fluticasone-salmeterol) .Marland Kitchen.. 1 puff two times a day  Other Orders: Est. Patient Level IV ([address]) Est. Patient Level IV ([address])  Lipid  Assessment/Plan:      Based on NCEP/ATP III, the patient's risk factor category is "0-1 risk factors".  The patient's lipid goals are as follows: Total cholesterol goal is 200; LDL cholesterol goal is 160; HDL cholesterol goal is 40; Triglyceride goal is 150.     Patient Instructions: 1)  change cough syrup to benzonatate 200 mg three times a day as needed for cough.   2)  change nexium to omeprazole 40 mg once daily. 3)  add advair-100, 1 puff two times a day.  rinse mouth after using.   4)  you should see your dermatologist for the retin-a product.   5)  please schedule a "medicare wellness" visit.   Prescriptions: ADVAIR DISKUS 100-50 MCG/DOSE AEPB (FLUTICASONE-SALMETEROL) 1 puff two times a day  #1 device x 2   Entered and Authorized by:   Minus Breeding MD   Signed by:   Minus Breeding MD on 08/01/2010   Method used:   Electronically to        [facility] [address] #[facility]* (retail)       [address] [place], [place]  [address]       Ph: [phone]       Fax: [phone]   RxID:   [id number] LOSARTAN POTASSIUM-HCTZ 50-12.5 MG TABS (LOSARTAN POTASSIUM-HCTZ) 1 tab once daily  #90 x 3   Entered and Authorized by:   Minus Breeding MD   Signed by:   Minus Breeding MD on 08/01/2010   Method used:   Print then Give to Patient   RxID:   [id number] OMEPRAZOLE 40 MG CPDR (OMEPRAZOLE) 1 tab once daily  #30 x 11   Entered and Authorized by:   Minus Breeding MD   Signed by:   Minus Breeding MD on 08/01/2010   Method used:   Print then Give to Patient   RxID:   [id number] BACTROBAN 2 % OINT (MUPIROCIN) three times a day as needed for acne  #1 med tube x 11   Entered and Authorized by:   Minus Breeding MD   Signed by:   Minus Breeding MD on 08/01/2010   Method used:   Print then Give to Patient   RxID:   [id number] BACTROBAN 2 % OINT (MUPIROCIN) three times a day as needed for acne  #1 med tube x 11   Entered and Authorized by:   Minus Breeding MD   Signed  by:   Minus Breeding MD on 08/01/2010   Method used:   Print then Give to Patient   RxID:   [id number] LOSARTAN POTASSIUM-HCTZ 50-12.5 MG TABS (LOSARTAN POTASSIUM-HCTZ) 1 tab once  daily  #90 x 3   Entered and Authorized by:   Minus Breeding MD   Signed by:   Minus Breeding MD on 08/01/2010   Method used:   Electronically to        [facility] #[id number]* (retail)       [address] [place], [place]  [address]       Ph: [phone]       Fax: [phone]   RxID:   [id number] FENOFIBRATE 54 MG TABS (FENOFIBRATE) 1 once daily  #90 x 3   Entered and Authorized by:   Minus Breeding MD   Signed by:   Minus Breeding MD on 08/01/2010   Method used:   Electronically to        [facility] #[facility]* (retail)       [address] [place], [place]  [address]       Ph: [phone]       Fax: [phone]   RxID:   [id number] OMEPRAZOLE 40 MG CPDR (OMEPRAZOLE) 1 tab once daily  #30 x 11   Entered and Authorized by:   Minus Breeding MD   Signed by:   Minus Breeding MD on 08/01/2010   Method used:   Electronically to        [facility] #[facility]* (retail)       [address] [place], [place]  [address]       Ph: [phone]       Fax: [phone]   RxID:   [id number] DOXYCYCLINE HYCLATE 100 MG CAPS (DOXYCYCLINE HYCLATE) 1 by mouth in AM and 1 by mouth in PM  #180 x 3   Entered and Authorized by:   Minus Breeding MD   Signed by:   Minus Breeding MD on 08/01/2010   Method used:   Faxed to ...       [facility] (mail-order)             , [place]         Ph: [phone]       Fax: [phone]   RxID:   [id number] OMEPRAZOLE 40 MG CPDR (OMEPRAZOLE) 1 tab once daily  #90 x 3   Entered and Authorized by:   Minus Breeding MD   Signed by:   Minus Breeding MD on 08/01/2010   Method used:   Faxed to ...       [facility] (mail-[facility])             , [place]         Ph: [phone]       Fax: [phone]   RxID:   [id number] LOSARTAN  POTASSIUM-HCTZ 50-12.5 MG TABS (LOSARTAN POTASSIUM-HCTZ) 1 tab once daily  #90 x 3   Entered and Authorized by:   Minus Breeding MD   Signed by:   Minus Breeding MD on 08/01/2010   Method used:   Faxed to ...       [facility] (mail-order)             , [place]         Ph: [phone]       Fax: [phone]   RxID:   [id number] COLESTIPOL HCL 1 GM TABS (COLESTIPOL HCL) 5 tabs once daily.  #450 x 3   Entered  and Authorized by:   Minus Breeding MD   Signed by:   Minus Breeding MD on 08/01/2010   Method used:   Faxed to ...       [facility] (mail-order)             , [place]         Ph: [phone]       Fax: [phone]   RxID:   [id number] BENZONATATE 200 MG CAPS (BENZONATATE) 1 tab three times a day as needed for cough  #30 x 0   Entered and Authorized by:   Minus Breeding MD   Signed by:   Minus Breeding MD on 08/01/2010   Method used:   Electronically to        [facility]* (retail)       [address] [place], [place]  [address]       Ph: [phone]       Fax: [phone]   RxID:   [id number]    Orders Added: 1)  Est. Patient Level IV [10932] 2)  Est. Patient Level IV [35[address]]

## 2010-08-17 NOTE — Consult Note (Signed)
Summary[facility]   Imported By: Sherian Rein 08/11/2010 08:43:59  _____________________________________________________________________  External Attachment:    Type:   Image     Comment:   External Document

## 2010-08-17 NOTE — Progress Notes (Signed)
Summary: [facility]   Imported By: Sherian Rein 08/11/2010 08:48:39  _____________________________________________________________________  External Attachment:    Type:   Image     Comment:   External Document

## 2010-08-17 NOTE — Letter (Signed)
Summary: [facility]   Imported By: Sherian Rein 08/11/2010 08:46:50  _____________________________________________________________________  External Attachment:    Type:   Image     Comment:   External Document

## 2010-08-17 NOTE — Progress Notes (Signed)
Summary: Triage   Phone Note Call from Patient Call back at Home Phone [phone]   Caller: Patient Call For: Dr. Marina Goodell Reason for Call: Talk to Nurse Summary of Call: pt. had an appt. today for 2:30 and the letter she received said 3:30. I spoke w/Dr. Marina Goodell and was told to r/s her appt. I r/s until 09-07-10 and she is requesting sooner appt. She is having problems w/her esophagus Initial call taken by: Karna Christmas,  August 08, 2010 2:49 PM  Follow-up for Phone Call        Spoke with patient and she is having some burning in her esophagus. Appointment made for patient to see Dr. Marina Goodell on 08/10/10@9 :45am. Patient verbalized understanding. Follow-up by: Selinda Michaels RN,  August 08, 2010 3:53 PM

## 2010-08-17 NOTE — Progress Notes (Signed)
Summary: [facility]   Imported By: Sherian Rein 08/11/2010 08:39:12  _____________________________________________________________________  External Attachment:    Type:   Image     Comment:   External Document

## 2010-08-17 NOTE — Assessment & Plan Note (Signed)
Summary: "Burning in her throat"    History of Present Illness Visit Type: Initial Consult Primary GI MD: Yancey Flemings MD Primary Provider: Elvina Sidle, M.D. Requesting Provider: Bayard Males Chief Complaint: Acid Reflux History of Present Illness:   50 year old female with anxiety/depression status post electroconvulsive therapy, hypertension, hyperlipidemia, obesity, GERD, peptic stricture, and prior cholecystectomy. Has not been seen by GI since 2006. Followed previously for bloating and constipation as well as GERD. Complete colonoscopy in 2004 was normal. Upper endoscopy in 2000 for normal except for benign distal esophageal stricture. Is maintained on PPI therapy for control of GERD symptoms. Nexium works well. Other PPIs less well. Severe pyrosis off medication. No dysphagia since previous dilation. Chief complaint today is that of dry irritated throat as well as some discomfort in the throat occasionally with meals. This has been going on for months. Next, rare episodes of nocturnal regurgitation with associated pharyngeal burning, coughing, choking. Last episode in December. Less problematic when adhering to reflux precautions including elevation of head of bed and avoiding large and or late-night meals. Has had weight loss related to her psychiatric meds. No lower GI complaints. She is accompanied by her daughter. Seen a few months ago by GI regarding hemorrhoids. Problem resolved after a hemorrhoid procedure elsewhere   GI Review of Systems    Reports acid reflux, bloating, chest pain, heartburn, and  weight loss.      Denies abdominal pain, belching, dysphagia with liquids, dysphagia with solids, loss of appetite, nausea, vomiting, vomiting blood, and  weight gain.      Reports hemorrhoids.     Denies anal fissure, black tarry stools, change in bowel habit, constipation, diarrhea, diverticulosis, fecal incontinence, heme positive stool, irritable bowel syndrome, jaundice, light  color stool, liver problems, rectal bleeding, and  rectal pain. Preventive Screening-Counseling & Management  Alcohol-Tobacco     Smoking Status: never      Drug Use:  no.      Current Medications (verified): 1)  Restoril 15 Mg  Caps (Temazepam) .... Take 3 By Mouth At Bedtime As Needed 2)  Xanax 1 Mg Tabs (Alprazolam) .Marland Kitchen.. 1 By Mouth Once Daily As Needed 3)  Proair Hfa 108 (90 Base) Mcg/act Aers (Albuterol Sulfate) .... Use As Directed 4)  Buspirone Hcl 10 Mg Tabs (Buspirone Hcl) .... 2 Tablets By Mouth in The Am 2 Tablets By Mouth At Night 5)  Lithium Carbonate 300 Mg Cr-Tabs (Lithium Carbonate) .Marland Kitchen.. 1 Tablet By Mouth Once Daily 6)  Fenofibrate 54 Mg Tabs (Fenofibrate) .Marland Kitchen.. 1 Once Daily 7)  Xanax Xr 1 Mg Xr24h-Tab (Alprazolam) .... 3 Tablets By Mouth At Bedtime 8)  Doxycycline Hyclate 100 Mg Caps (Doxycycline Hyclate) .Marland Kitchen.. 1 By Mouth in Am and 1 By Mouth in Pm 9)  Viibryd 40 Mg Tabs (Vilazodone Hcl) .... 1/2 Tablet By Mouth Once Daily (20mg  10)  Colestipol Hcl 1 Gm Tabs (Colestipol Hcl) .... 5 Tabs Once Daily. 11)  Losartan Potassium-Hctz 50-12.5 Mg Tabs (Losartan Potassium-Hctz) .Marland Kitchen.. 1 Tab Once Daily 12)  Chlorzoxazone 500 Mg Tabs (Chlorzoxazone) .Marland Kitchen.. 1 Tab Three Times A Day As Needed For Muscle Spasms 13)  Xylocaine Jelly 2 % Gel (Lidocaine Hcl) .... Use Rectally 3 Times Daily 14)  Proctosol Hc 2.5 % Crea (Hydrocortisone) .... Use Rectally For Rectal Pain For Hemorrhoids 15)  Benzonatate 200 Mg Caps (Benzonatate) .Marland Kitchen.. 1 Tab Three Times A Day As Needed For Cough 16)  Nexium 40 Mg Cpdr (Esomeprazole Magnesium) .Marland Kitchen.. 1 By Mouth Once Daily 17)  Bactroban 2 % Oint (Mupirocin) .... Three Times A Day As Needed For Acne 18)  Advair Diskus 100-50 Mcg/dose Aepb (Fluticasone-Salmeterol) .Marland Kitchen.. 1 Puff Two Times A Day 19)  Dexilant 60 Mg Cpdr (Dexlansoprazole) .Marland Kitchen.. 1 By Mouth Once Daily For 2 Days Then The 3rd Day Take Nexium  Allergies (verified): 1)  ! Penicillin 2)  ! * Ivp Dye 3)  !  Sulfa 4)  ! Darvon  Past History:  Past Medical History: Anxiety Depression GERD Hyperlipidemia Hypertension  Allergic rhinitis MD roster: psyc - plovsky Anemia Chronic Headaches Congestive Heart Failure Esophageal Stricture Kidney Stones Obesity Hx of Gallstones  Past Surgical History: Reviewed history from 09/17/2007 and no changes required. Cholecystectomy Hysterectomy Tubal ligation  Family History: Family History of Colon Cancer: Uncle Family History of Breast Cancer:GGrandmother lung Cancer:GGrandmother Family History of Diabetes: Type 1 Grandmother Family History of Heart Disease: mothers siblings  Social History: disabled divorced lives with boyfriend (since 2002) 3 children Patient has never smoked.  Alcohol Use - no Illicit Drug Use - no Smoking Status:  never Drug Use:  no  Review of Systems       The patient complains of allergy/sinus, anxiety-new, confusion, cough, depression-new, headaches-new, shortness of breath, sleeping problems, and vision changes.  The patient denies anemia, arthritis/joint pain, back pain, blood in urine, breast changes/lumps, change in vision, coughing up blood, fainting, fatigue, fever, hearing problems, heart murmur, heart rhythm changes, itching, menstrual pain, muscle pains/cramps, night sweats, nosebleeds, pregnancy symptoms, skin rash, sore throat, swelling of feet/legs, swollen lymph glands, thirst - excessive , urination - excessive , urination changes/pain, urine leakage, and voice change.    Vital Signs:  Patient profile:   50 year old female Height:      64 inches Weight:      167 pounds BMI:     28.77 BSA:     1.81 Pulse rate:   80 / minute Pulse rhythm:   regular BP sitting:   108 / 82  (left arm)  Vitals Entered By: Merri Ray CMA Duncan Dull) (August 10, 2010 9:59 AM)  Physical Exam  General:  Well developed, well nourished, no acute distress. Head:  Normocephalic and atraumatic. Eyes:  PERRLA,  no icterus. Ears:  Normal auditory acuity. Nose:  No deformity, discharge,  or lesions. Mouth:  No deformity or lesions. Neck:  Supple; no masses or thyromegaly. Lungs:  Clear throughout to auscultation. Heart:  Regular rate and rhythm; no murmurs, rubs,  or bruits. Abdomen:  Soft, obese,nontender and nondistended. No masses, hepatosplenomegaly or hernias noted. Normal bowel sounds. Prior surgical incision well healed. Msk:  Symmetrical with no gross deformities. Normal posture. Pulses:  Normal pulses noted. Extremities:  No clubbing, cyanosis, edema or deformities noted. Neurologic:  Alert and  oriented x4;  grossly normal neurologically. Skin:  Intact without significant lesions or rashes. Psych:  Alert and cooperative. Normal mood and affect.   Impression & Recommendations:  Problem # 1:  GERD (ICD-23.8) 50 year old female with a history of GERD and peptic stricture. Occasional significant nocturnal regurgitation. Requiring PPI for control of pyrosis. No esophageal dysphagia. Chief complaint is chronic dryness or irritation in the throat region. Not clear to me that this is directly related to GERD. Maybe allergies, nasal regurgitation, or medications. Has not had an ENT evaluation.  Plan: #1. Continue least expensive PPI that controls symptoms #2. Reflux precautions with attention to elevation of head of bed at night, avoiding large and/or late meals, and ongoing weight loss #3. Diagnostic  upper endoscopy. The patient will need propofol sedation due to chronic benzodiazepine use #4. The pharyngeal symptoms persist post EGD, consider ENT evaluation. I will leave this to her primary provider #5. Literature on EGD provided  Problem # 2:  HEMORRHOIDS-EXTERNAL (ICD-455.3) Assessment: Improved  Problem # 3:  SCREENING COLORECTAL-CANCER (ICD-V76.51) due for routine followup in 2014  Other Orders: EGD (EGD)  Patient Instructions: 1)  EGD with [facility] 08/25/10 4:00 pm arrive at  3:00 pm on 4th floor 2)  Upper Endoscopy brochure given.  3)  Nexium samples given to take 1 by mouth once daily  4)  Copy sent to : Elnoria Howard, M.D.; Bayard Males 5)  The medication list was reviewed and reconciled.  All changed / newly prescribed medications were explained.  A complete medication list was provided to the patient / caregiver.

## 2010-08-17 NOTE — Procedures (Signed)
Summary: EGD/[place]  EGD/[place]   Imported By: Sherian Rein 08/11/2010 08:37:22  _____________________________________________________________________  External Attachment:    Type:   Image     Comment:   External Document

## 2010-08-17 NOTE — Consult Note (Signed)
Summary: [facility]   Imported By: Sherian Rein 08/11/2010 08:41:15  _____________________________________________________________________  External Attachment:    Type:   Image     Comment:   External Document

## 2010-08-25 ENCOUNTER — Encounter (AMBULATORY_SURGERY_CENTER): Payer: Medicare Other | Admitting: Internal Medicine

## 2010-08-25 ENCOUNTER — Encounter: Payer: Self-pay | Admitting: Internal Medicine

## 2010-08-25 DIAGNOSIS — K222 Esophageal obstruction: Secondary | ICD-10-CM

## 2010-08-25 DIAGNOSIS — K219 Gastro-esophageal reflux disease without esophagitis: Secondary | ICD-10-CM

## 2010-08-29 NOTE — Procedures (Addendum)
Summary: Upper Endoscopy  Patient: [name] Note: All result statuses are Final unless otherwise noted.  Tests: (1) Upper Endoscopy (EGD)   EGD Upper Endoscopy       DONE     [facility]     [address]     [place], [place]  [address]           ENDOSCOPY PROCEDURE REPORT           PATIENT:  Tamara [name], Tamara [name]  MR#:  [id number]     BIRTHDATE:  1960-10-30, 49 yrs. old  GENDER:  female           ENDOSCOPIST:  Wilhemina Bonito. Eda Keys, MD     Referred by:  Office           PROCEDURE DATE:  08/25/2010     PROCEDURE:  EGD, diagnostic 30865     ASA CLASS:  Class II     INDICATIONS:  GERD ; PHARYGEAL IRRITATION           MEDICATIONS:   MAC sedation, administered by CRNA, propofol     (Diprivan) 150 mg IV     TOPICAL ANESTHETIC:           DESCRIPTION OF PROCEDURE:   After the risks benefits and     alternatives of the procedure were thoroughly explained, informed     consent was obtained.  The [facility] [id number] endoscope was     introduced through the mouth and advanced to the second portion of     the duodenum, without limitations.  The instrument was slowly     withdrawn as the mucosa was fully examined.     <<PROCEDUREIMAGES>>           A large caliber esophageal ring was found in the distal esophagus.     Otherwise normal esophagus.  The stomach was entered and closely     examined. The antrum, angularis, and lesser curvature were well     visualized, including a retroflexed view of the cardia and fundus.     The stomach wall was normally distensable. The scope passed easily     through the pylorus into the duodenum.  The duodenal bulb was     normal in appearance, as was the postbulbar duodenum.     Retroflexed views revealed a small hiatal hernia.    The scope was     then withdrawn from the patient and the procedure completed.           COMPLICATIONS:  None           ENDOSCOPIC IMPRESSION:     1) Incidental Ring in the distal esophagus     2) Otherwise normal  esophagus     3) Normal stomach     4) Normal duodenum     5) A small hiatal hernia     6) NO FINDINGS TO EXPLAIN SYMPTOMS           RECOMMENDATIONS:     1) Anti-reflux regimen to be followed     2) Continue reflux medication to control symptoms     3) Return to primary provider. Consider ENT evaluation           ______________________________     Wilhemina Bonito. Eda Keys, MD           CC:  Elvina Sidle, MD; The Patient           n.     eSIGNED:  Wilhemina Bonito. Eda Keys at 08/25/2010 03:35 PM           Ivor Reining, [id number]  Note: An exclamation mark (!) indicates a result that was not dispersed into the flowsheet. Document Creation Date: 08/25/2010 3:35 PM _______________________________________________________________________  (1) Order result status: Final Collection or observation date-time: 08/25/2010 15:26 Requested date-time:  Receipt date-time:  Reported date-time:  Referring Physician:   Ordering Physician: Fransico Setters [phone]) Specimen Source:  Source: Tamara [name] Order Number: [id number] Lab site:

## 2010-09-07 ENCOUNTER — Ambulatory Visit: Payer: Self-pay | Admitting: Internal Medicine

## 2010-09-26 ENCOUNTER — Other Ambulatory Visit: Payer: Self-pay | Admitting: Neurology

## 2010-09-26 DIAGNOSIS — R413 Other amnesia: Secondary | ICD-10-CM

## 2010-09-26 DIAGNOSIS — G43009 Migraine without aura, not intractable, without status migrainosus: Secondary | ICD-10-CM

## 2010-09-26 DIAGNOSIS — I10 Essential (primary) hypertension: Secondary | ICD-10-CM

## 2010-09-26 DIAGNOSIS — F411 Generalized anxiety disorder: Secondary | ICD-10-CM

## 2010-09-26 DIAGNOSIS — F3289 Other specified depressive episodes: Secondary | ICD-10-CM

## 2010-09-26 DIAGNOSIS — O99810 Abnormal glucose complicating pregnancy: Secondary | ICD-10-CM

## 2010-09-26 DIAGNOSIS — F329 Major depressive disorder, single episode, unspecified: Secondary | ICD-10-CM

## 2010-09-26 DIAGNOSIS — E785 Hyperlipidemia, unspecified: Secondary | ICD-10-CM

## 2010-09-26 DIAGNOSIS — J45909 Unspecified asthma, uncomplicated: Secondary | ICD-10-CM

## 2010-09-28 ENCOUNTER — Ambulatory Visit
Admission: RE | Admit: 2010-09-28 | Discharge: 2010-09-28 | Disposition: A | Discharge status: Home / Self Care | Payer: Medicare Other | Source: Ambulatory Visit | Attending: Neurology | Admitting: Neurology

## 2010-09-28 DIAGNOSIS — F411 Generalized anxiety disorder: Secondary | ICD-10-CM

## 2010-09-28 DIAGNOSIS — F3289 Other specified depressive episodes: Secondary | ICD-10-CM

## 2010-09-28 DIAGNOSIS — O99810 Abnormal glucose complicating pregnancy: Secondary | ICD-10-CM

## 2010-09-28 DIAGNOSIS — R413 Other amnesia: Secondary | ICD-10-CM

## 2010-09-28 DIAGNOSIS — I10 Essential (primary) hypertension: Secondary | ICD-10-CM

## 2010-09-28 DIAGNOSIS — G43009 Migraine without aura, not intractable, without status migrainosus: Secondary | ICD-10-CM

## 2010-09-28 DIAGNOSIS — E785 Hyperlipidemia, unspecified: Secondary | ICD-10-CM

## 2010-09-28 DIAGNOSIS — F329 Major depressive disorder, single episode, unspecified: Secondary | ICD-10-CM

## 2010-09-28 DIAGNOSIS — J45909 Unspecified asthma, uncomplicated: Secondary | ICD-10-CM

## 2010-11-17 NOTE — Discharge Summary (Signed)
   NAME:  Tamara [name], Tamara [name]                       ACCOUNT NO.:  [id number]   MEDICAL RECORD NO.:  [id number]                   PATIENT TYPE:  INP   LOCATION:  9326                                 FACILITY:  [facility]   PHYSICIAN:  Sherry A. Rosalio Macadamia, M.D.           DATE OF BIRTH:  1961/04/16   DATE OF ADMISSION:  02/17/2003  DATE OF DISCHARGE:  02/19/2003                                 DISCHARGE SUMMARY   PROBLEM:  Fibroid uterus.   SUBJECTIVE:  The patient is a 50 year old, G3, P3-0-0-3 woman with  excessively heavy menstrual period causing significant anemia.  The patient  had an ultrasound performed showing fibroid uterus with a very thin  endometrial cavity, therefore, having no polyp or submucosal fibroid causing  her heavy bleeding.  The patient was given a choice of endometrial ablation  LAVH, or abdominal  hysterectomy.  The patient requested abdominal  hysterectomy to maintain her cervix for maximizing her sexual activity after  surgery.  Therefore, the patient is admitted for a supracervical  hysterectomy.   OBJECTIVE:   PHYSICAL EXAMINATION:  HEENT:  Within normal limits.  NECK:  Without lymphadenopathy.  Thyroid without nodule.  CHEST:  Clear to auscultation.  HEART:  Regular rhythm without murmur.  BREASTS:  Normal breast exam April 2004.  ABDOMEN:  Obese, soft, nontender, without mass.  PELVIC:  External genitalia within normal limits.  Cervix within normal  limits.  Uterus anteflexed to irregular, tender, 11-week size.  Adnexa  without mass.   HOSPITAL COURSE:  The patient was admitted and brought to the operating room  where a supracervical abdominal hysterectomy was performed for 10-week size  fibroid uterus.  There were no surgical complications.  Postoperatively, the  patient did well.  She remained afebrile.  Her vital signs remained stable.  Preoperative hematocrit was 38.0; postoperative hematocrit on first  postoperative day was 31.9.   The patient was  discharged to home on her second postoperative day.   ASSESSMENT:  Stable status post supracervical hysterectomy.   PLAN:  Follow up in the office in three weeks.  The patient will call if she  has a temperature greater than 101, heavy bleeding, or severe pain.   DISCHARGE MEDICATIONS:  1. Vicodin 1 to 2 q.4h. p.r.n.  2. Advil or Aleve.     SAD/MEDQ  D:  03/19/2003  T:  03/20/2003  Job:  [id number]

## 2010-11-17 NOTE — H&P (Signed)
NAME:  TAMIAH, DYSART NO.:  [id number]   MEDICAL RECORD NO.:  [id number]          PATIENT TYPE:  EMS   LOCATION:  MAJO                         FACILITY:  [facility]   PHYSICIAN:  Wanda Plump, MD [facility]    DATE OF BIRTH:  1961-03-20   DATE OF ADMISSION:  11/10/2004  DATE OF DISCHARGE:                                HISTORY & PHYSICAL   PRIMARY CARE PHYSICIAN:  Sean A. Everardo All, M.D. [facility]   CHIEF COMPLAINT:  Chest pain.   HISTORY OF PRESENT ILLNESS:  Ms. Magill is a 50 year old white female who  developed chest pain around 9 a.m. tonight. The patient was at home sitting  in front of the computer. The pain is described as a push in the middle of  the chest with radiation to the left shoulder and left arm. There was no  associated nausea, but she feels somehow short of breath and slightly  diaphoretic. With these symptoms she presented to the emergency room where  she received morphine and nitroglycerin with not sharp, but gradual  improvement of her pain, and at this point she is basically pain free.   PAST MEDICAL HISTORY:  1.  Hypertension.  2.  Depression and anxiety with an admission for suicidal ideation in 2001.  3.  Supracervical hysterectomy secondary fibroid tumors and anemia.  4.  Asthma.  5.  Migraines.  6.  Acne.   SOCIAL HISTORY:  Denies any alcohol or tobacco.   FAMILY HISTORY:  Positive for hypertension in father and mother, but no  coronary artery disease that she knows of.   REVIEW OF SYSTEMS:  She denies any fever, chills, or cough. Her heartburn is  well controlled with Nexium. No abdominal pain. She denies any lower  extremity edema, but admits to some occasional left leg pain. She has  headaches on and off, and they are slightly more consistent in the last two  weeks.   MEDICATIONS:  1.  Diovan 80 mg one p.o. daily.  2.  HCTZ 12.5 mg one p.o. daily.  3.  Nexium.  4.  Minocycline.  5.  Nortriptyline 75 one p.o. daily.  6.  Ativan 1 mg one  p.o. t.i.d. p.r.n.  7.  Crestor dose unknown for the last couple of weeks.   ALLERGIES:  1.  PENICILLIN.  2.  SULFA.  3.  DARVON.  4.  IVP DYE.   PHYSICAL EXAMINATION:  GENERAL: The patient is alert and oriented, and in no  apparent distress.  VITAL SIGNS: She is afebrile. Heart rate around 100, blood pressure 124/94,  respirations 20.  NECK: Normal carotid pulses.  LUNGS: Clear to auscultation bilaterally.  CARDIOVASCULAR: Regular rate and rhythm without murmur. The chest wall is  nontender to palpation.  ABDOMEN: Nondistended, soft with good bowel sounds and no organomegaly.  EXTREMITIES: No peripheral edema and the calves are symmetric.  NEUROLOGIC: Speech and motor are intact.   LABORATORY AND X-RAYS:  White count 8.9, 12.8, platelet count 325,000.  Potassium 3.2, BUN 9, creatinine 1.0, blood sugar 120. LFTs are normal.  Calcium normal. PT-INR  normal. One set of cardiac enzymes are negative.  Chest x-ray is negative.   ASSESSMENT/PLAN:  Ms. Nonaka has been admitted to my service with chest  pain. The cardiovascular risk factors include hypertension,  hypercholesterolemia, and moderate obesity. She will be admitted to the  telemetry bed. Will rule out with serial enzymes and EKGs. Will also check a  D-dimer and start her on beta blockers as well as aspirin and Lovenox.  Further plans with lab results and clinical status.      JEP/MEDQ  D:  11/11/2004  T:  11/11/2004  Job:  [id number]

## 2010-11-17 NOTE — Discharge Summary (Signed)
[facility]  Patient:    Tamara [name], Tamara [name]                          MRN: [id number] Adm. Date:  [id number] Disc. Date: [id number] Attending:  Otilio Saber                           Discharge Summary  BRIEF HISTORY:  Ms. Zachow is a 50 year old  separated white female admitted with history of depression and suicide attempt.  She had become increasingly depressed and suicidal for two weeks.  She had been summoned for divorce papers the day prior to admission and had become quite distraught. She had also lost her job in May and had marked financial difficulties.  She took five Xanax and several Tranxene along with one Effexor in a suicide attempt. She reported sleeping well but reported markedly decreased appetite.  The patient was being followed at [facility] and also saw Gypsy Balsam, M.D., for medical management.  She had a six year history of depression. She had been on several antidepressants in the past including Zoloft, Celexa, Paxil and Effexor.  She had recently been prescribed Effexor 150 mg q.d.  She was followed medically by Justine Null, M.D., in [place], [place].  She had a history of hypertension, asthma and migraines.  At the time of admission she was on Diovan, albuterol inhaler, Tranxene and Prilosec in addition to her Effexor.  ALLERGIES:  She reported being allergic to PENICILLIN and SULFA.  PHYSICAL EXAMINATION:  This was performed at [facility] Emergency Room with no significant findings.  MENTAL STATUS EXAMINATION:  This revealed a middle-age white female.  Speech was normal.  Thought processes were logical and coherent without evidence of psychosis.  She had suicidal ideation.  Mood was depressed.  Affect was flat. Oriented x 3.  Cognitive functioning intact.  ADMISSION DIAGNOSES: AXIS I.   Major depression, severe. AXIS II.  Deferred. AXIS III. 1. Hypertension.           2. Asthma.           3.  Migraines. AXIS IV.  Psychosocial stressors severe. AXIS V.   Global Assessment of Functioning current was 20, highest in the           past year was 60.  LABORATORY FINDINGS:  Admission thyroid panel was normal.  Urine pregnancy test was negative.  HOSPITAL COURSE:  The patient was admitted to the [facility] for treatment of her depression.  She was continued on Effexor.  We elected to try her on Klonopin to help with her sleep. She talked about her difficulty coping with issues at home.  Her suicidal thoughts had decreased and she felt better able to cope.  She stated that the "shock" had worn off from her recent difficulties. She was sleeping and eating and her affect was brighter.  She denied any suicidal thoughts and it was thought she could be managed on an outpatient basis.  CONDITION ON DISCHARGE:  The patient was discharged in improved condition with improvement in mood, sleep and appetite and alleviation of her suicidal ideation.  DISPOSITION:  The patient was discharged home.  She was to follow up with [facility] and was to see Dr. Donell Beers on May 08, 2000.  DISCHARGE MEDICATIONS: 1. Effexor 150 mg XR q.a.m. 2. Klonopin 0.5 mg q.h.s. 3. Diovan-hydrochlorothiazide 80/12.5 q.d. 4. Prilosec  20 mg q.d.  FINAL DIAGNOSES: AXIS I.   Major depression, recurrent, severe. AXIS II.  No diagnosis. AXIS III. 1. Hypertension.           2. Asthma.           3. Migraines. AXIS IV.  Psychosocial stressors severe. AXIS V.   Global Assessment of Functioning current was 50, highest in the           past year was 60. DD:  05/28/00 TD:  05/28/00 Job: [id number]

## 2010-11-17 NOTE — Discharge Summary (Signed)
NAME:  Tamara, [name] NO.:  [id number]   MEDICAL RECORD NO.:  [id number]          PATIENT TYPE:  INP   LOCATION:  4730                         FACILITY:  [facility]   PHYSICIAN:  Wanda Plump, MD [facility]    DATE OF BIRTH:  14-Aug-1960   DATE OF ADMISSION:  11/10/2004  DATE OF DISCHARGE:  11/12/2004                                 DISCHARGE SUMMARY   ADMITTING DIAGNOSIS:  Chest pain.   DISCHARGE DIAGNOSES:  1.  Chest pain with a negative Cardiolite.  2.  Hypertension.  3.  Headache with history of migraine.  We did a head CT during this      admission that showed no acute changes, but was not completely normal;      please see below for details.  The patient may need an MRI for further      evaluation.   BRIEF HISTORY AND PHYSICAL:  Tamara [name] is a 50 year old white female who  was admitted with atypical chest pain.  The review of systems was negative  for fever, cough, heartburn or abdominal pain.   On physical exam, her heart rate was around 100, blood pressure 124/94.  She  was afebrile.  Lungs were clear to auscultation bilaterally.  Cardiovascular  was regular rate and rhythm without a murmur.  Abdomen was benign.   LABORATORY AND X-RAYS:  White count was 8.9 with a hemoglobin of 12.8,  potassium 3.3, BUN 10, creatinine 0.8, blood sugar 116.  Liver enzymes were  normal except for a bilirubin of 1.6, which is slightly elevated.  Cardiac  enzymes were negative.  D-dimer was negative.   Chest x-ray showed no acute changes.   HOSPITAL COURSE:  PROBLEM #1 - CARDIAC:  The patient was admitted to the  hospital for further evaluation of her chest pain.  Her EKGs remained normal  x2.  We did a Cardiolite, paced, and it was negative; in light of that, her  chest pain is most likely noncardiac and she can be followed up as an  outpatient.   PROBLEM #2 - NEUROLOGICAL:  The patient has a history of migraines and while  in the hospital, she developed severe headache which  was mostly left-sided  and associated with nausea.  The neurological exam was within normal limits.  This prompted a CT of the head which is read as nonacute.  The radiologist,  however, is concerned about empty sella and also there is a caution about  low-lying cerebellar tonsils.  They suggest further evaluation if there is a  concern of a pseudotumor cerebri.  I will defer further evaluation to the  patient's primary care, Dr. Everardo All, who will get a copy of this dictation.   PROBLEM #3 - Other than that, the patient was stable while in the hospital  and she will be discharged on her routine home medicines and she already has  an appointment to see Dr. Everardo All within a few days.      JEP/MEDQ  D:  11/12/2004  T:  11/13/2004  Job:  [id number]

## 2010-11-17 NOTE — H&P (Signed)
[facility]  Patient:    Tamara [name], Tamara [name]                          MRN: [id number] Adm. Date:  [id number] Attending:  Otilio Saber Dictator:   Candi Leash. Theressa Stamps, N.P.                   Psychiatric Admission Assessment  IDENTIFYING INFORMATION:  This is a 50 year old white separated female, voluntarily admitted to [facility] on April 21, 2000 for suicide attempt, depression.  HISTORY OF PRESENT ILLNESS:  The patient reports increasing depression and suicidal ideation for the past two weeks.  She was summoned with divorce papers yesterday and it was just more than she could take.  She also lost her job in May.  She is having financial difficulties. Children are in two different households.  She started taking Xanax 5 and then a couple Tranxene at home with one Effexor as a suicide attempt.  She said she has been sleeping okay. Her appetite has been nil, although she has not lost any weight, presently feeling numb.  She denies any auditory or visual hallucinations.  PAST PSYCHIATRIC HISTORY:  She sees a Veterinary surgeon at [facility]. Her name is Britta Mccreedy.  She also see Dr. Donell Beers for medical management.  She has had a history of depression for about the past six years, has been on several antidepressants, Zoloft, Celexa, Paxil and Effexor.  She has recently been prescribed Celexa for the past few weeks 150 mg every day, has been of minor benefit so far.  SOCIAL HISTORY:  A 50 year old, married for the past eleven years.  This is her second marriage.  She says she is soon to be divorced.  She has three children, 51, 62 and 64-year-old children.  The 29 and 33-year-old are from this marriage.  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  She has been doing that since July and states she enjoys her work.  She has completed 12th grade with one year of college.  As stated earlier, she is  having some financial difficulties.  FAMILY HISTORY:  None that she is aware of.  ALCOHOL AND DRUG HISTORY:  She is a nonsmoker, no alcohol habits, denies any street drug use.  PAST MEDICAL HISTORY:  Her primary care Sosaia Pittinger is Dr. Everardo All in [place]. Medical problems include hypertension, asthma and migraines.  MEDICATIONS:  Diovan, albuterol inhaler, Tranxene, unsure of dosage, has been taking for about two weeks for sleep and Prilosec 1 q.d.  ALLERGIES:  She is allergic to PENICILLIN and SULFA.  She gets a rash.  DARVON - she hallucinates and with IVP DYE she gets swelling.  PHYSICAL EXAMINATION:  Physical examination was performed at [facility] Emergency Department on April 21, 2000.  Her urine drug screen was negative. Her alcohol level was less than 5.  Her vital signs are stable.  MENTAL STATUS EXAMINATION:  Appearance and Behavior:  An alert, middle-aged overweight white female.  She is in her pajamas.  She is cooperative, has fair eye contact.  Her speech is normal and relevant.  Her mood is depressed.  Her affect is depressed and flat.  Thought processes are intact.  There is no evidence of psychosis.  Negative auditory or visual hallucinations.  Negative delusions, negative flight f ideas.  Cognitive functioning is intact.  Memory is good.  Judgment is  poor, insight is fair, impulse control is poor.  DIAGNOSES: Axis I:    Major depressive disorder. Axis II:   Deferred. Axis III:  1. Hypertension.            2. Asthma.            3. Migraines. Axis IV:   Severe with problems related to primary support groups,.            occupational problems and economic problems.  Other psychosocial            problems related to divorce. Axis V:    Current global assessment of functioning 20.  PLAN:  Voluntary admission to [facility] for depression and suicide attempt.  Contract for safety, check every 15 minutes.  The patient is in agreement.  Will resume her  routine medications.  Goal is to have the patients depressive signs and symptoms to stabilize where she can safely be discharged and she can demonstrate an improved mood and absence of suicidal ideation.  In addition, the patient will be able to discuss options for the future, beyond her feelings of hopelessness and helplessness and the patient will be able to identify life situations that contribute to her depressed mood.  TENTATIVE LENGTH OF STAY:  Approximately three days. DD:  04/22/00 TD:  04/23/00 Job: [id number]

## 2010-11-17 NOTE — Op Note (Signed)
[facility]  Patient:    Tamara [name], Tamara [name] Visit Number: [id number] MRN: [id number]          Service Type: DSU Location: [facility] Attending Physician:  Morene Antu Dictated by:   Sherry A. Rosalio Macadamia, M.D. Proc. Date: 04/23/01 Admit Date:  04/23/2001                             Operative Report  PREOPERATIVE DIAGNOSIS:       Desire for sterilization.  POSTOPERATIVE DIAGNOSIS:      Desire for sterilization.  OPERATION:                    Open laparoscopic tubal cautery.  SURGEON:                      Sherry A. Rosalio Macadamia, M.D.  ANESTHESIA:                   General.  ESTIMATED BLOOD LOSS:         Less than 5 cc.  INDICATIONS:                  This is a 50 year old G3, P3-0-0-3 woman who requests permanent sterilization procedure. She understands the risks involved as well as the failure rate and the alternatives to the procedure.  INTRAOPERATIVE FINDINGS:      Globular 7-8 weeks sized anteflexed uterus. Left ovary with probable follicular cyst -- entire ovary approximately 5 cm in size; right tube and ovary within normal limits.  DESCRIPTION OF PROCEDURE:     The patient was brought into the operating room and given adequate general anesthesia. She was placed in the dorsal lithotomy position and her abdomen and vagina were washed with Betadine. The bladder was in and out catheterized. A speculum was placed within the vagina. The cervix was grasped with a Hulka tenaculum in an anteflexed fashion; the speculum was removed. The surgeons gown and gloves were changed and the patient was draped in a sterile fashion. The subumbilical area was infiltrated with 0.25% Marcaine. An incision was made and S retractors were placed in the incision. The subcutaneous tissue was dissected down to the fascia. The fascia was grasped with two Kocher clamps; this was incised. The fascial edges were grasped with #0 Vicryl sutures. The peritoneum was  identified with blunt dissection and it was entered bluntly. The Hasson sleeve was introduced into the abdominal cavity. It was cinched down with the #0 Vicryl stay sutures. Carbon dioxide was insufflated. A suprapubic incision was made after infiltrating with 0.25% Marcaine. Under direct visualization a suprapubic trocar was placed. The pelvis was inspected. The left fallopian tube was grasped in its isthmic ampullary portion and cauterized over approximately 3 cm. There was 1-2 cm of normal fallopian tube because the cauterized portion of the cornua. The same procedure was then performed on the right. The entire pelvis was inspected with no other abnormalities seen. The upper abdomen was inspected. There were some adhesions of the omentum to the anterior abdominal wall. The appendix was completely normal. All carbon dioxide was then allowed to escape. The subumbilical area was closed with the #0 Vicryl stay sutures. Extra suturing in a running locked stitch was taken to completely close the fascia; adequate hemostasis was present. The incisions were closed with Dermabond. The Hulka tenaculum was removed from the vagina. The patient was taken out of the dorsal  lithotomy position, she was awakened, she was extubated and she was removed from the operating room table to the stretcher in stable condition. Complications were none. Estimated blood loss was less than 5 cc. Dictated by:   Sherry A. Rosalio Macadamia, M.D. Attending Physician:  Morene Antu DD:  04/23/01 TD:  04/24/01 Job: [id number]

## 2010-11-17 NOTE — Op Note (Signed)
NAME:  Tamara [name], INTRIERI                       ACCOUNT NO.:  [id number]   MEDICAL RECORD NO.:  [id number]                   PATIENT TYPE:  INP   LOCATION:  9326                                 FACILITY:  [facility]   PHYSICIAN:  Sherry A. Rosalio Macadamia, M.D.           DATE OF BIRTH:  30-Jun-1961   DATE OF PROCEDURE:  02/17/2003  DATE OF DISCHARGE:                                 OPERATIVE REPORT   PREOPERATIVE DIAGNOSES:  1. Fibroid uterus.  2. Menorrhagia.   POSTOPERATIVE DIAGNOSES:  1. Fibroid uterus.  2. Menorrhagia.   PROCEDURE:  Supracervical hysterectomy.   SURGEON:  Sherry A. Rosalio Macadamia, M.D. and Genia Del, M.D.   ANESTHESIA:  Epidural.   INDICATIONS FOR PROCEDURE:  This is a 50 year old G3, P3-0-0-3, woman with  excessively heavy menstrual periods causing anemia. The patient had  ultrasound performed showing fibroid uterus with a very thin endometrial  cavity.  The patient was given option of endometrial ablation, LAVH, or  abdominal hysterectomy.  The patient requests abdominal hysterectomy with  supracervical procedure for maximizing her sexual activity after surgery.  Because of this, the patient was brought to the operating room for abdominal  hysterectomy, supracervical.   FINDINGS:  A 10 weeks' size anteflexed uterus.  Normal fallopian tubes and  ovaries.  Small adhesions of right ovary to side wall.   SURGICAL SPECIMEN:  Uterus.   PROCEDURE:  The patient was brought into the operating room, given adequate  epidural anesthesia.  She was placed in the frogleg position.  Her abdomen  and vagina were washed with Hibiclens.  A Foley catheter was inserted in the  bladder.  The patient was taken out of the frogleg position.  She was draped  in sterile fashion.  A Pfannenstiel incision was made and brought down  sharply to the fascia.  Bleeders were cauterized.  The fascia was incised  sharply.  The fascia was elevated off of the rectus muscles with blunt and  sharp  dissection.  The peritoneum was identified and entered bluntly.  Peritoneal incision was extended superiorly and inferiorly under direct  visualization.  A Balfour retractor was placed within the abdominal cavity.  Bowel was packed off with lap pads.  Long Kelly clamps were placed on either  side of the uterus.  The left round ligament was identified using the  LigaSure, was cauterized x3, and cut.  The utero-ovarian ligaments were  cauterized x2 in each place and cut down to just above the uterine arteries.  The same procedure was performed on the right.  The bladder flap was  developed with sharp and then blunt dissection.  Small bleeders were  cauterized.  The uterine arteries were clamped and cauterized x3.  They were  also clamped, cut, and suture ligated with 0 Vicryl ligatures.  Adequate  hemostasis was present.  The uterus was then dissected free from the cervix  with a scalpel.  The stump was grasped with  the Kocher clamps and a wedge  resection of the center was removed to assure all lower uterine segment to  be removed.  The cervical stump was then closed with 0 Vicryl in figure-of-  eight stitches at approximately the posterior portion of the cervix was  removed with this wedge resection and the vaginal edge was identified.  The  vaginal edge was closed against the cervical stump with 0 Vicryl figure-of-  eight stitches.  Once the stump was closed in this fashion and adequate  hemostasis was present, the bladder peritoneum was brought over the stump  and using 0 Vicryl mattress type stitch was attached to the posterior  peritoneum between the uterosacral ligaments.  This was done to minimize  adhesions after surgery.  Each of the utero-ovarian pedicles were sutured to  the round ligament pedicle to bring the ovaries out of the pelvis.  The  abdomen was irrigated with large amounts of warm saline.  The pelvis was  inspected and adequate hemostasis was present.  The right ovary had  to be  dissected off the right peritoneal side wall with some blunt dissection and  the edge of the ovary was cauterized as well at the base near the peritoneal  dissection area.  Adequate hemostasis was present.  All packs removed from  the abdomen.  The peritoneum and muscles were inspected for any bleeders and  any bleeders were cauterized.  The fascia was then closed with 0 Vicryl in  two running stitches laterally to the midline.  The incision was irrigated.  Bleeders were cauterized.  Skin was infiltrated with 0.5% Marcaine.  The  skin was then closed with 4-0 Monocryl in subcuticular running stitch.  Bandage was placed over the wound.  The patient was awakened.  She was  removed from the operating table to a stretcher in stable condition.  Complications were none.  Estimated blood loss less than 50mL.                                               Sherry A. Rosalio Macadamia, M.D.    SAD/MEDQ  D:  02/17/2003  T:  02/17/2003  Job:  [id number]

## 2010-12-05 ENCOUNTER — Ambulatory Visit: Payer: Self-pay

## 2010-12-05 ENCOUNTER — Ambulatory Visit: Payer: Medicare Other | Attending: Neurology

## 2010-12-05 DIAGNOSIS — R4189 Other symptoms and signs involving cognitive functions and awareness: Secondary | ICD-10-CM | POA: Insufficient documentation

## 2010-12-05 DIAGNOSIS — IMO0001 Reserved for inherently not codable concepts without codable children: Secondary | ICD-10-CM | POA: Insufficient documentation

## 2010-12-05 DIAGNOSIS — R4789 Other speech disturbances: Secondary | ICD-10-CM | POA: Insufficient documentation

## 2010-12-05 DIAGNOSIS — R413 Other amnesia: Secondary | ICD-10-CM | POA: Insufficient documentation

## 2010-12-05 DIAGNOSIS — R41840 Attention and concentration deficit: Secondary | ICD-10-CM | POA: Insufficient documentation

## 2010-12-14 ENCOUNTER — Ambulatory Visit: Payer: Medicare Other

## 2010-12-20 ENCOUNTER — Ambulatory Visit: Payer: Medicare Other

## 2010-12-22 ENCOUNTER — Ambulatory Visit: Payer: Medicare Other

## 2010-12-29 ENCOUNTER — Ambulatory Visit: Payer: Medicare Other

## 2011-01-08 ENCOUNTER — Ambulatory Visit: Payer: Medicare Other | Attending: Neurology

## 2011-01-08 DIAGNOSIS — IMO0001 Reserved for inherently not codable concepts without codable children: Secondary | ICD-10-CM | POA: Insufficient documentation

## 2011-01-08 DIAGNOSIS — R4189 Other symptoms and signs involving cognitive functions and awareness: Secondary | ICD-10-CM | POA: Insufficient documentation

## 2011-01-08 DIAGNOSIS — R41840 Attention and concentration deficit: Secondary | ICD-10-CM | POA: Insufficient documentation

## 2011-01-08 DIAGNOSIS — R4789 Other speech disturbances: Secondary | ICD-10-CM | POA: Insufficient documentation

## 2011-01-08 DIAGNOSIS — R413 Other amnesia: Secondary | ICD-10-CM | POA: Insufficient documentation

## 2011-01-11 ENCOUNTER — Ambulatory Visit: Payer: Medicare Other

## 2011-04-04 ENCOUNTER — Other Ambulatory Visit: Payer: Self-pay | Admitting: Obstetrics

## 2011-04-04 DIAGNOSIS — Z1231 Encounter for screening mammogram for malignant neoplasm of breast: Secondary | ICD-10-CM

## 2011-04-19 ENCOUNTER — Ambulatory Visit
Admission: RE | Admit: 2011-04-19 | Discharge: 2011-04-19 | Disposition: A | Discharge status: Home / Self Care | Payer: Medicare Other | Source: Ambulatory Visit | Attending: Obstetrics | Admitting: Obstetrics

## 2011-04-19 DIAGNOSIS — Z1231 Encounter for screening mammogram for malignant neoplasm of breast: Secondary | ICD-10-CM

## 2011-04-25 ENCOUNTER — Other Ambulatory Visit: Payer: Self-pay | Admitting: Obstetrics

## 2011-04-25 DIAGNOSIS — R928 Other abnormal and inconclusive findings on diagnostic imaging of breast: Secondary | ICD-10-CM

## 2011-05-03 ENCOUNTER — Ambulatory Visit
Admission: RE | Admit: 2011-05-03 | Discharge: 2011-05-03 | Disposition: A | Discharge status: Home / Self Care | Payer: Medicare Other | Source: Ambulatory Visit | Attending: Obstetrics | Admitting: Obstetrics

## 2011-05-03 DIAGNOSIS — R928 Other abnormal and inconclusive findings on diagnostic imaging of breast: Secondary | ICD-10-CM

## 2011-05-09 ENCOUNTER — Other Ambulatory Visit: Payer: Medicare Other

## 2011-07-05 DIAGNOSIS — F331 Major depressive disorder, recurrent, moderate: Secondary | ICD-10-CM | POA: Diagnosis not present

## 2011-07-09 DIAGNOSIS — F331 Major depressive disorder, recurrent, moderate: Secondary | ICD-10-CM | POA: Diagnosis not present

## 2011-07-10 DIAGNOSIS — N951 Menopausal and female climacteric states: Secondary | ICD-10-CM | POA: Diagnosis not present

## 2011-07-10 DIAGNOSIS — R454 Irritability and anger: Secondary | ICD-10-CM | POA: Diagnosis not present

## 2011-07-10 DIAGNOSIS — F39 Unspecified mood [affective] disorder: Secondary | ICD-10-CM | POA: Diagnosis not present

## 2011-07-11 DIAGNOSIS — F329 Major depressive disorder, single episode, unspecified: Secondary | ICD-10-CM | POA: Diagnosis not present

## 2011-07-12 DIAGNOSIS — F331 Major depressive disorder, recurrent, moderate: Secondary | ICD-10-CM | POA: Diagnosis not present

## 2011-07-16 DIAGNOSIS — F331 Major depressive disorder, recurrent, moderate: Secondary | ICD-10-CM | POA: Diagnosis not present

## 2011-07-18 DIAGNOSIS — F331 Major depressive disorder, recurrent, moderate: Secondary | ICD-10-CM | POA: Diagnosis not present

## 2011-07-24 DIAGNOSIS — F331 Major depressive disorder, recurrent, moderate: Secondary | ICD-10-CM | POA: Diagnosis not present

## 2011-07-26 DIAGNOSIS — F331 Major depressive disorder, recurrent, moderate: Secondary | ICD-10-CM | POA: Diagnosis not present

## 2011-07-30 DIAGNOSIS — F331 Major depressive disorder, recurrent, moderate: Secondary | ICD-10-CM | POA: Diagnosis not present

## 2011-08-01 DIAGNOSIS — F331 Major depressive disorder, recurrent, moderate: Secondary | ICD-10-CM | POA: Diagnosis not present

## 2011-08-02 DIAGNOSIS — F331 Major depressive disorder, recurrent, moderate: Secondary | ICD-10-CM | POA: Diagnosis not present

## 2011-08-06 ENCOUNTER — Telehealth: Payer: Self-pay

## 2011-08-06 DIAGNOSIS — F331 Major depressive disorder, recurrent, moderate: Secondary | ICD-10-CM | POA: Diagnosis not present

## 2011-08-06 NOTE — Telephone Encounter (Signed)
Spoke with Tamara [name] about needing OV. Tamara [name] states she has appt for CPE on 08/24/11. D/w Tamara [name] possibility that Dr. Elbert Ewings might change her RX and Tamara [name] decided best to just call in #[facility] to [facility] [address] Called in RF of Fenofibrate 160 mg. 1 tab QD #30, 0 RF.

## 2011-08-06 NOTE — Telephone Encounter (Signed)
.  [facility] PT STATES SHE NEED A REFILL ON HER FENOSIBRATE 160MG S AND IT NEED TO GO TO [email or web address] AND THE PHONE NUMBER IS [phone] WITH THE ACCT AT [phone] YOU MAY REACH PT AT [phone] IF QUESTIONS

## 2011-08-06 NOTE — Telephone Encounter (Signed)
Left message with boyfriend to have pt CB. Pt is due for CPE - hasn't been seen since Feb/2012. What is plan for f/up?

## 2011-08-09 DIAGNOSIS — F0390 Unspecified dementia without behavioral disturbance: Secondary | ICD-10-CM | POA: Diagnosis not present

## 2011-08-09 DIAGNOSIS — F05 Delirium due to known physiological condition: Secondary | ICD-10-CM | POA: Diagnosis not present

## 2011-08-09 DIAGNOSIS — F333 Major depressive disorder, recurrent, severe with psychotic symptoms: Secondary | ICD-10-CM | POA: Diagnosis not present

## 2011-08-09 DIAGNOSIS — Z79899 Other long term (current) drug therapy: Secondary | ICD-10-CM | POA: Diagnosis not present

## 2011-08-09 DIAGNOSIS — F329 Major depressive disorder, single episode, unspecified: Secondary | ICD-10-CM | POA: Diagnosis not present

## 2011-08-10 DIAGNOSIS — F331 Major depressive disorder, recurrent, moderate: Secondary | ICD-10-CM | POA: Diagnosis not present

## 2011-08-13 DIAGNOSIS — F331 Major depressive disorder, recurrent, moderate: Secondary | ICD-10-CM | POA: Diagnosis not present

## 2011-08-17 ENCOUNTER — Telehealth: Payer: Self-pay

## 2011-08-17 NOTE — Telephone Encounter (Signed)
PT IS REQUESTING MED REFILLS 60MG  [facility] [address]

## 2011-08-18 MED ORDER — DEXLANSOPRAZOLE 60 MG PO CPDR: 60.0000 mg | DELAYED_RELEASE_CAPSULE | Freq: Every day | ORAL | Status: DC

## 2011-08-18 NOTE — Telephone Encounter (Signed)
Spoke with pt she need Dexilant 60 mg. Her appt is Feb 28 with Dr Milus Glazier and she doesn't have enough until then. Can we refill?

## 2011-08-18 NOTE — Telephone Encounter (Signed)
LMOM to CB. 

## 2011-08-18 NOTE — Telephone Encounter (Signed)
clarify

## 2011-08-20 DIAGNOSIS — F331 Major depressive disorder, recurrent, moderate: Secondary | ICD-10-CM | POA: Diagnosis not present

## 2011-08-23 DIAGNOSIS — F331 Major depressive disorder, recurrent, moderate: Secondary | ICD-10-CM | POA: Diagnosis not present

## 2011-08-27 DIAGNOSIS — F331 Major depressive disorder, recurrent, moderate: Secondary | ICD-10-CM | POA: Diagnosis not present

## 2011-08-30 ENCOUNTER — Encounter: Payer: Self-pay | Admitting: Family Medicine

## 2011-08-30 ENCOUNTER — Ambulatory Visit (INDEPENDENT_AMBULATORY_CARE_PROVIDER_SITE_OTHER): Payer: No Typology Code available for payment source | Admitting: Family Medicine

## 2011-08-30 VITALS — BP 114/81 | HR 89 | Temp 98.6°F | Resp 16 | Ht 62.0 in | Wt 189.0 lb

## 2011-08-30 DIAGNOSIS — Z0000 Encounter for general adult medical examination without abnormal findings: Secondary | ICD-10-CM

## 2011-08-30 DIAGNOSIS — R079 Chest pain, unspecified: Secondary | ICD-10-CM

## 2011-08-30 DIAGNOSIS — E785 Hyperlipidemia, unspecified: Secondary | ICD-10-CM | POA: Diagnosis not present

## 2011-08-30 DIAGNOSIS — F4310 Post-traumatic stress disorder, unspecified: Secondary | ICD-10-CM | POA: Diagnosis not present

## 2011-08-30 DIAGNOSIS — R5383 Other fatigue: Secondary | ICD-10-CM

## 2011-08-30 DIAGNOSIS — M791 Myalgia, unspecified site: Secondary | ICD-10-CM

## 2011-08-30 DIAGNOSIS — F331 Major depressive disorder, recurrent, moderate: Secondary | ICD-10-CM | POA: Diagnosis not present

## 2011-08-30 DIAGNOSIS — IMO0001 Reserved for inherently not codable concepts without codable children: Secondary | ICD-10-CM | POA: Diagnosis not present

## 2011-08-30 LAB — COMPREHENSIVE METABOLIC PANEL
ALT: 27 U/L (ref 0–35)
AST: 23 U/L (ref 0–37)
Albumin: 4.4 g/dL (ref 3.5–5.2)
Alkaline Phosphatase: 31 U/L — ABNORMAL LOW (ref 39–117)
BUN: 20 mg/dL (ref 6–23)
CO2: 24 mEq/L (ref 19–32)
Calcium: 10.1 mg/dL (ref 8.4–10.5)
Chloride: 105 mEq/L (ref 96–112)
Creat: 1.08 mg/dL (ref 0.50–1.10)
Glucose, Bld: 89 mg/dL (ref 70–99)
Potassium: 3.9 mEq/L (ref 3.5–5.3)
Sodium: 138 mEq/L (ref 135–145)
Total Bilirubin: 0.6 mg/dL (ref 0.3–1.2)
Total Protein: 7.3 g/dL (ref 6.0–8.3)

## 2011-08-30 LAB — CBC
HCT: 39.4 % (ref 36.0–46.0)
Hemoglobin: 13.5 g/dL (ref 12.0–15.0)
MCH: 29.7 pg (ref 26.0–34.0)
MCHC: 34.3 g/dL (ref 30.0–36.0)
MCV: 86.6 fL (ref 78.0–100.0)
Platelets: 357 10*3/uL (ref 150–400)
RBC: 4.55 MIL/uL (ref 3.87–5.11)
RDW: 12.8 % (ref 11.5–15.5)
WBC: 6.4 10*3/uL (ref 4.0–10.5)

## 2011-08-30 LAB — LIPID PANEL
Cholesterol: 268 mg/dL — ABNORMAL HIGH (ref 0–200)
HDL: 46 mg/dL (ref >39)
LDL Cholesterol: 171 mg/dL — ABNORMAL HIGH (ref 0–99)
Total CHOL/HDL Ratio: 5.8 Ratio
Triglycerides: 256 mg/dL — ABNORMAL HIGH (ref <150)
VLDL: 51 mg/dL — ABNORMAL HIGH (ref 0–40)

## 2011-08-30 LAB — TSH: TSH: 0.132 u[IU]/mL — ABNORMAL LOW (ref 0.350–4.500)

## 2011-08-30 LAB — POCT URINALYSIS DIPSTICK
Bilirubin, UA: NEGATIVE
Glucose, UA: NEGATIVE
Ketones, UA: NEGATIVE
Leukocytes, UA: NEGATIVE
Nitrite, UA: NEGATIVE
Protein, UA: NEGATIVE
Spec Grav, UA: 1.025
Urobilinogen, UA: 0.2
pH, UA: 6

## 2011-08-30 LAB — CK: Total CK: 38 U/L (ref 7–177)

## 2011-08-30 MED ORDER — PNEUMOCOCCAL VAC POLYVALENT 25 MCG/0.5ML IJ INJ: 0.5000 mL | INJECTION | INTRAMUSCULAR | Status: AC

## 2011-08-30 NOTE — Progress Notes (Signed)
This 51 year old woman comes in for complete physical today. Has many complaints but the most overriding problem is stress and depression. Stress comes from having a 19 year old daughter who is dysfunctional. She left him today which should relieve some of the stress. She also is a 54 year old cousin named Rinaldo Ratel at the home. She's noticed that she's been missing some of her Xanax and suspects that Rinaldo Ratel is responsible. Trey Paula, doses of her medicines but he's also stressed and is having more panic attacks. Trey Paula has multiple sclerosis. Christy Sartorius is worried about Trey Paula.  Diane's been having more insomnia. She is embarking on a 12 week program at [facility] in the dialectic psychological program to improve her speech. She sees a therapist named Hilda Lias and is seeing Dr. Brendia Sacks a regular basis. She notes she freezes verbally under pressure. She's also been having left chest pains, shortness of breath and headache daily and this is been increasing in frequency over the last 6 weeks. She's also noticed increase in reflux, tearfulness, and painful thighs hips and calves. She is to see a chiropractor for the painful legs but has not seen him lately. In addition she's having soreness in each of her outside of her breasts. She had a mammogram a couple months ago and is due for followup study 7.  She had a stress test and nuclear NGO 5 years ago which were negative.  Immunization review shows that she had a DPT at [facility] in the past but she's not sure when.  Objective:: Tearful obese woman in moderate distress talking about her problems. She's alert and cooperative.  Skin warm and dry no acne at the present time  HEENT: Unremarkable  Eyes: Normal funduscopic EOM intact, pupils equal and reactive.  Chest: Clear  Heart: Regular no murmur no gallop  Abdomen: Soft nontender no HSM or masses  Extremities: No abnormalities good range of motion normal reflexes  Neurological: Back depression score 45, patient  tearful but appropriate  Patient occasionally has trouble verbalizing her feelings what's happening.  Cranial nerves III through XII intact  Patient moves his all 4 extremities  EKG normal sinus with some T wave changes in the anterior leads  Assessment: Patient seems to be decompensating a bit with respect to depression. She has made some changes which should help but very concerned about her. I will be calling Dr. Brendia Sacks.  Plan: Discussed depression without [place], we'll run laboratory testing thyroid muscle soreness  Cardiology evaluation for abnormal EKG

## 2011-08-31 ENCOUNTER — Encounter: Payer: Self-pay | Admitting: Cardiology

## 2011-08-31 ENCOUNTER — Ambulatory Visit (INDEPENDENT_AMBULATORY_CARE_PROVIDER_SITE_OTHER): Payer: Medicare Other | Admitting: Cardiology

## 2011-08-31 DIAGNOSIS — R9431 Abnormal electrocardiogram [ECG] [EKG]: Secondary | ICD-10-CM | POA: Insufficient documentation

## 2011-08-31 DIAGNOSIS — E785 Hyperlipidemia, unspecified: Secondary | ICD-10-CM | POA: Diagnosis not present

## 2011-08-31 LAB — SEDIMENTATION RATE: Sed Rate: 12 mm/hr (ref 0–22)

## 2011-08-31 NOTE — Assessment & Plan Note (Signed)
Her LDL done yesterday was 171. She's been sensitive to statins in the past and there are multiple potential for drug interactions. She needs to have this treated but complicated so I would like to send her to our lipid clinic. She consents to this.

## 2011-08-31 NOTE — Assessment & Plan Note (Signed)
The blood pressure is at target. No change in medications is indicated. We will continue with therapeutic lifestyle changes (TLC).  

## 2011-08-31 NOTE — Assessment & Plan Note (Signed)
Her EKG is as described. Symptoms are somewhat vague and however, she has significant cardiovascular risk factors. She needs to be screened with a stress test would not be a little walk on the treadmill show she will have a [employer].

## 2011-08-31 NOTE — Progress Notes (Signed)
HPI The patient presents for evaluation of multiple symptoms such as chest pressure. She has a long history of depression and panic. However, she says her usual panic attacks seem to have changed over the last couple of months and she's had problems discerning these from her previous symptoms.  I She has not been particularly active as she's been more exhausted recently. She says her symptoms have been randomly. She feels "waves" washing across her. She might have some palpitations. She intermittently gets chest pressure which may or may not be associated with these episodes. She has a constant headache which she didn't have before.  She has increasing shortness of breath and has used her inhaler more. He saw her primary provider for followup and was noted to have new T-wave inversions on her EKG in the anterior leads. She was referred here for further evaluation.  Allergies  Allergen Reactions  . Penicillins   . Propoxyphene Hcl   . Sulfonamide Derivatives     Current Outpatient Prescriptions  Medication Sig Dispense Refill  . ALPRAZolam (XANAX) 1 MG tablet Take 1 mg by mouth continuous as needed.      . ALPRAZOLAM XR 1 MG 24 hr tablet       . dexlansoprazole (DEXILANT) 60 MG capsule Take 1 capsule (60 mg total) by mouth daily.  30 capsule  0  . doxycycline (DORYX) 100 MG DR capsule Take 100 mg by mouth 2 (two) times daily.      . fenofibrate 160 MG tablet       . liothyronine (CYTOMEL) 25 MCG tablet       . losartan-hydrochlorothiazide (HYZAAR) 50-12.5 MG per tablet Take 1 tablet by mouth daily.      . NON FORMULARY Take 4 mg by mouth as needed. Trilofan 4 mg      . Oxcarbazepine (TRILEPTAL) 300 MG tablet Take 300 mg by mouth 2 (two) times daily.      . temazepam (RESTORIL) 15 MG capsule       . tretinoin (RETIN-A) 0.025 % gel Apply topically at bedtime.      . VENTOLIN HFA 108 (90 BASE) MCG/ACT inhaler       . Vilazodone HCl (VIIBRYD) 20 MG TABS Take by mouth daily.       Current  Facility-Administered Medications  Medication Dose Route Frequency Provider Last Rate Last Dose  . pneumococcal 23 valent vaccine (PNU-IMMUNE) injection 0.5 mL  0.5 mL Intramuscular Tomorrow-1000 Elvina Sidle, MD        Past Medical History  Diagnosis Date  . Depression   . HTN (hypertension)   . Hyperlipemia     Past Surgical History  Procedure Date  . Cholecystectomy   . Abdominal hysterectomy     Family History  Problem Relation Age of Onset  . Arrhythmia Father   . Coronary artery disease Maternal Grandfather 26    Died 20    History   Social History  . Marital Status: Divorced    Spouse Name: N/A    Number of Children: N/A  . Years of Education: N/A   Occupational History  . Not on file.   Social History Main Topics  . Smoking status: Never Smoker   . Smokeless tobacco: Not on file  . Alcohol Use: No  . Drug Use: No  . Sexually Active: Not on file   Other Topics Concern  . Not on file   Social History Narrative   Lives with boyfriend.    ROS:  Positive for headaches and dizziness, dyspnea, reflux, shoulder pain. Otherwise as stated in the HPI and negative for all other systems.  PHYSICAL EXAM BP 125/80  Pulse 82  Ht 5\' 2"  (1.575 m)  Wt 189 lb (85.73 kg)  BMI 34.57 kg/m2 GENERAL:  Well appearing HEENT:  Pupils equal round and reactive, fundi not visualized, oral mucosa unremarkable NECK:  No jugular venous distention, waveform within normal limits, carotid upstroke brisk and symmetric, no bruits, no thyromegaly LYMPHATICS:  No cervical, inguinal adenopathy LUNGS:  Clear to auscultation bilaterally BACK:  No CVA tenderness CHEST:  Unremarkable HEART:  PMI not displaced or sustained,S1 and S2 within normal limits, no S3, no S4, no clicks, no rubs, no murmurs ABD:  Flat, positive bowel sounds normal in frequency in pitch, no bruits, no rebound, no guarding, no midline pulsatile mass, no hepatomegaly, no splenomegaly EXT:  2 plus pulses  throughout, no edema, no cyanosis no clubbing SKIN:  No rashes no nodules NEURO:  Cranial nerves II through XII grossly intact, motor grossly intact throughout PSYCH:  Cognitively intact, oriented to person place and time  EKG:   08/30/11    NSR with T wave inversion in the anterior leads new compared with a 2010 EKG.     ASSESSMENT AND PLAN

## 2011-08-31 NOTE — Patient Instructions (Signed)
Your physician has requested that you have a lexiscan myoview. For further information please visit [email or web address] Please follow instruction sheet, as given.  You have been referred to the lipid clinic.  The current medical regimen is effective;  continue present plan and medications.

## 2011-09-03 ENCOUNTER — Ambulatory Visit (INDEPENDENT_AMBULATORY_CARE_PROVIDER_SITE_OTHER): Payer: Medicare Other | Admitting: Pharmacist

## 2011-09-03 ENCOUNTER — Ambulatory Visit (HOSPITAL_COMMUNITY): Payer: Medicare Other | Attending: Internal Medicine | Admitting: Radiology

## 2011-09-03 VITALS — BP 118/87 | Ht 62.0 in | Wt 188.0 lb

## 2011-09-03 DIAGNOSIS — R0989 Other specified symptoms and signs involving the circulatory and respiratory systems: Secondary | ICD-10-CM | POA: Diagnosis not present

## 2011-09-03 DIAGNOSIS — R5381 Other malaise: Secondary | ICD-10-CM | POA: Diagnosis not present

## 2011-09-03 DIAGNOSIS — R42 Dizziness and giddiness: Secondary | ICD-10-CM | POA: Diagnosis not present

## 2011-09-03 DIAGNOSIS — R0602 Shortness of breath: Secondary | ICD-10-CM | POA: Diagnosis not present

## 2011-09-03 DIAGNOSIS — R9431 Abnormal electrocardiogram [ECG] [EKG]: Secondary | ICD-10-CM | POA: Insufficient documentation

## 2011-09-03 DIAGNOSIS — R0789 Other chest pain: Secondary | ICD-10-CM | POA: Diagnosis not present

## 2011-09-03 DIAGNOSIS — E785 Hyperlipidemia, unspecified: Secondary | ICD-10-CM | POA: Insufficient documentation

## 2011-09-03 DIAGNOSIS — J45909 Unspecified asthma, uncomplicated: Secondary | ICD-10-CM | POA: Insufficient documentation

## 2011-09-03 DIAGNOSIS — E7800 Pure hypercholesterolemia, unspecified: Secondary | ICD-10-CM

## 2011-09-03 DIAGNOSIS — E669 Obesity, unspecified: Secondary | ICD-10-CM | POA: Diagnosis not present

## 2011-09-03 DIAGNOSIS — R079 Chest pain, unspecified: Secondary | ICD-10-CM

## 2011-09-03 DIAGNOSIS — R5383 Other fatigue: Secondary | ICD-10-CM | POA: Insufficient documentation

## 2011-09-03 DIAGNOSIS — I10 Essential (primary) hypertension: Secondary | ICD-10-CM | POA: Insufficient documentation

## 2011-09-03 DIAGNOSIS — R0609 Other forms of dyspnea: Secondary | ICD-10-CM | POA: Diagnosis not present

## 2011-09-03 MED ORDER — TECHNETIUM TC 99M TETROFOSMIN IV KIT
32.9000 | PACK | Freq: Once | INTRAVENOUS | Status: AC | PRN
  Administered 2011-09-03: 32.9 via INTRAVENOUS

## 2011-09-03 MED ORDER — AMINOPHYLLINE 25 MG/ML IV SOLN
75.0000 mg | Freq: Once | INTRAVENOUS | Status: AC
  Administered 2011-09-03: 75 mg via INTRAVENOUS

## 2011-09-03 MED ORDER — REGADENOSON 0.4 MG/5ML IV SOLN
0.4000 mg | Freq: Once | INTRAVENOUS | Status: AC
  Administered 2011-09-03: 0.4 mg via INTRAVENOUS

## 2011-09-03 MED ORDER — TECHNETIUM TC 99M TETROFOSMIN IV KIT
11.0000 | PACK | Freq: Once | INTRAVENOUS | Status: AC | PRN
  Administered 2011-09-03: 11 via INTRAVENOUS

## 2011-09-03 MED ORDER — PRAVASTATIN SODIUM 40 MG PO TABS: 40.0000 mg | ORAL_TABLET | Freq: Every evening | ORAL | Status: DC

## 2011-09-03 NOTE — Patient Instructions (Addendum)
Start pravastatin 40mg  once daily.  If you have any problems please call Kennon Rounds at [phone]  Continue to eat lean meats and fish and limit red meats   Try to walk up the hill by your house at least 5 days per week.   Recheck labs in 2 months.  We will call you for an appt.

## 2011-09-03 NOTE — Progress Notes (Signed)
50yoF presented to the clinic as a new patient for Lipid management. She reports previously trying simvastatin 80mg  (LDL at that time was ~80) which was discontinued due to drug interactions with her psychiatric medications and muscle aches. She has also previously used colestipol.  Currently, she is only taking fenofibrate 160mg  daily. Underwent stress test today, where she became symptomatic and showed some EKG changes (but no picture changes). Patient has medicare prefers generic medications to avoid the "donut hole."  Review of Exercise: Currently not exercising other than 5-10 minutes of relaxation yoga prior to bedtime. Previously enjoyed going to the gym, doing water aerobics, fitness classes; however has fallen out of routine and reports it has been hard to get back to exercise due to mood/psych issues.  Review of Diet: Avoids processed "chemical" foods. Drinks a lot of water and decaffeinated drinks; does not use artificial sweeteners. Tries to purchase hormone-free foods. Says she uses real butter, but not in excess. She denies eating sweets because they make her feel sick while on her current antipsychotics. Complains of feeling hungry every 2-3 hours for the last 3-6 months and is not sure why this is occuring.  Breakfast: 1-2 scrambled eggs with toast, oatmeal, or occasional biscuit. Lunch: PBJ sandwich with glass of milk; occasional egg salad. Dinner: grilled pork chops, beef, chicken, fish with veggies like green beans, peas, brussel sprouts, carrots. Snacks: 1/2 PBJ sandwich with milk.  Assessment/Plan: 50yoF with uncontrolled hyperlipidemia based on TChol 268 (goal <200), Trig 256 (goal < 150), LDL 171 (goal <70), HDL 46 (goal 45). Will start Pravastatin 40mg  daily in addition to current fenofibrate 160mg  daily. Recommended patient to continue to choose lean meats and healthier food options. Recommended patient to increase walking as able. Follow-up in 2 months with fasting labs and clinic  visit.

## 2011-09-03 NOTE — Progress Notes (Signed)
[facility] SITE 3 NUCLEAR MED [address] [place] [address] [phone]  Cardiology Nuclear Med Study  Tamara [name] is a 51 y.o. female [id number] Apr 21, 1961   Nuclear Med Background Indication for Stress Test:  Evaluation for Ischemia and Abnormal EKG History:  Asthma and '06 Myocardial Perfusion Study:Normal, EF=87% Cardiac Risk Factors: Hypertension, Lipids and Obesity  Symptoms:  Chest Pressure.  (last episode of chest discomfort was while in waiting area today, 2/10; none now), Dizziness, DOE/SOB and Fatigue    Nuclear Pre-Procedure Caffeine/Decaff Intake:  None NPO After: 5:00am   Lungs:  Clear.  O2 SAT 98% on RA. IV 0.9% NS with Angio Cath:  20g  IV Site: R Antecubital  IV Started by:  Stanton Kidney, EMT-P  Chest Size (in):  36 Cup Size: D  Height: 5\' 2"  (1.575 m)  Weight:  188 lb (85.276 kg)  BMI:  Body mass index is 34.39 kg/(m^2). Tech Comments:  NA    Nuclear Med Study 1 or 2 day study: 1 day  Stress Test Type:  Lexiscan  Reading MD: Dietrich Pates, MD  Order Authorizing Provider:  Rollene Rotunda, MD  Resting Radionuclide: Technetium 59m Tetrofosmin  Resting Radionuclide Dose: 11.0 mCi   Stress Radionuclide:  Technetium 52m Tetrofosmin  Stress Radionuclide Dose: 32.9 mCi           Stress Protocol Rest HR: 79 Stress HR: 118  Rest BP: 118/87 Stress BP: 126/94  Exercise Time (min): n/a METS: n/a   Predicted Max HR: 171 bpm % Max HR: 69.01 bpm Rate Pressure Product: 44034   Dose of Adenosine (mg):  n/a Dose of Lexiscan: 0.4 mg Dose of Aminophylline:75 mg  Dose of Atropine (mg): n/a Dose of Dobutamine: n/a mcg/kg/min (at max HR)  Stress Test Technologist: Smiley Houseman, CMA-N  Nuclear Technologist:  Domenic Polite, CNMT     Rest Procedure:  Myocardial perfusion imaging was performed at rest 45 minutes following the intravenous administration of Technetium 33m Tetrofosmin.  Rest ECG: Nonspecific T-wave changes.  Stress Procedure:   The patient received IV Lexiscan 0.4 mg over 15-seconds.  Technetium 4m Tetrofosmin injected at 30-seconds.  There were marked T-wave changes with Lexiscan.  She c/o chest, neck and jaw tightness with Lexiscan.  She was given NTG without relief and then Aminophylline 75 mg IV with some relief.  Quantitative spect images were obtained after a 45 minute delay.  EKG's, images and symptoms were discussed with Dr. Antoine Poche  and he felt it was safe for the patient to be discharged.  Stress ECG: No significant change from baseline ECG  QPS Raw Data Images:  Soft tissue (diaphragm, breast) surround heart. Stress Images:  Normal homogeneous uptake in all areas of the myocardium. Rest Images:  Normal homogeneous uptake in all areas of the myocardium. Subtraction (SDS):  No evidence of ischemia. Transient Ischemic Dilatation (Normal <1.22):  1.02 Lung/Heart Ratio (Normal <0.45):  0.29  Quantitative Gated Spect Images QGS EDV:  42 ml QGS ESV:  5 ml QGS cine images:  NL LV Function; NL Wall Motion QGS EF: 89%  Impression Exercise Capacity:  Lexiscan with no exercise. BP Response:  Normal blood pressure response. Clinical Symptoms:  Patient with significant jaw, neck, L arm and chest pain.  Given NTG and aminophylline with relief.  CLinically positive. ECG Impression:  No significant ST segment change suggestive of ischemia. Comparison with Prior Nuclear Study: No significant change from previous perfusion report.  Overall Impression:  CLinically positive, electrically  negative.  Myoview with normal perfusion.  LV appears small.  LVEF greater than 80%

## 2011-09-06 ENCOUNTER — Telehealth: Payer: Self-pay | Admitting: Cardiology

## 2011-09-06 DIAGNOSIS — F331 Major depressive disorder, recurrent, moderate: Secondary | ICD-10-CM | POA: Diagnosis not present

## 2011-09-06 NOTE — Telephone Encounter (Signed)
Reviewed further the results of pts stress testing.  She was concerned because when she was in the lipid clinic she was told that her pain "was cardiac related".  Nuclear stress testing demonstrates normal EF and no ischemia.  Pt was referred back to her primary care for further evaluation.

## 2011-09-06 NOTE — Telephone Encounter (Signed)
Fu call Pt had some more questions about stress test she had done

## 2011-09-07 ENCOUNTER — Telehealth: Payer: Self-pay

## 2011-09-07 NOTE — Telephone Encounter (Signed)
Vikki Ports from [employer] order calling to ask questions about patients doxy rx please call and clarify rx

## 2011-09-11 DIAGNOSIS — F331 Major depressive disorder, recurrent, moderate: Secondary | ICD-10-CM | POA: Diagnosis not present

## 2011-09-11 NOTE — Telephone Encounter (Signed)
LMOM for Tamara [name] to [facility] with her ? About Rx

## 2011-09-12 NOTE — Telephone Encounter (Signed)
Gaye from [employer] called and LM on my VM asking for [place] to call and do prior Arth for pts Dexilant. Call [employer] options [phone]

## 2011-09-13 DIAGNOSIS — F331 Major depressive disorder, recurrent, moderate: Secondary | ICD-10-CM | POA: Diagnosis not present

## 2011-09-14 DIAGNOSIS — M9981 Other biomechanical lesions of cervical region: Secondary | ICD-10-CM | POA: Diagnosis not present

## 2011-09-14 DIAGNOSIS — S139XXA Sprain of joints and ligaments of unspecified parts of neck, initial encounter: Secondary | ICD-10-CM | POA: Diagnosis not present

## 2011-09-14 NOTE — Telephone Encounter (Signed)
Called ins back and told them we have not received fax from yesterday and asked for re-fax of form. Was told they had not gotten paperwork done yet to send, but operator I was speaking to verified info and said she would do it herself and fax form

## 2011-09-14 NOTE — Telephone Encounter (Signed)
Prior Auth form for Doxy (for pt's cystic acne) was received and filled out, faxing back.

## 2011-09-24 DIAGNOSIS — F331 Major depressive disorder, recurrent, moderate: Secondary | ICD-10-CM | POA: Diagnosis not present

## 2011-09-25 ENCOUNTER — Other Ambulatory Visit: Payer: Self-pay | Admitting: Obstetrics & Gynecology

## 2011-09-25 DIAGNOSIS — N63 Unspecified lump in unspecified breast: Secondary | ICD-10-CM

## 2011-09-26 DIAGNOSIS — F331 Major depressive disorder, recurrent, moderate: Secondary | ICD-10-CM | POA: Diagnosis not present

## 2011-09-27 DIAGNOSIS — F331 Major depressive disorder, recurrent, moderate: Secondary | ICD-10-CM | POA: Diagnosis not present

## 2011-10-01 DIAGNOSIS — F331 Major depressive disorder, recurrent, moderate: Secondary | ICD-10-CM | POA: Diagnosis not present

## 2011-10-04 ENCOUNTER — Encounter: Payer: Self-pay | Admitting: Family Medicine

## 2011-10-04 ENCOUNTER — Ambulatory Visit (INDEPENDENT_AMBULATORY_CARE_PROVIDER_SITE_OTHER): Payer: No Typology Code available for payment source | Admitting: Family Medicine

## 2011-10-04 VITALS — BP 129/90 | HR 102 | Temp 98.8°F | Resp 16 | Ht 62.0 in | Wt 189.0 lb

## 2011-10-04 DIAGNOSIS — IMO0001 Reserved for inherently not codable concepts without codable children: Secondary | ICD-10-CM

## 2011-10-04 DIAGNOSIS — E789 Disorder of lipoprotein metabolism, unspecified: Secondary | ICD-10-CM | POA: Diagnosis not present

## 2011-10-04 DIAGNOSIS — E039 Hypothyroidism, unspecified: Secondary | ICD-10-CM

## 2011-10-04 DIAGNOSIS — M791 Myalgia, unspecified site: Secondary | ICD-10-CM

## 2011-10-04 DIAGNOSIS — F331 Major depressive disorder, recurrent, moderate: Secondary | ICD-10-CM | POA: Diagnosis not present

## 2011-10-04 DIAGNOSIS — E041 Nontoxic single thyroid nodule: Secondary | ICD-10-CM | POA: Diagnosis not present

## 2011-10-04 DIAGNOSIS — E785 Hyperlipidemia, unspecified: Secondary | ICD-10-CM | POA: Diagnosis not present

## 2011-10-04 DIAGNOSIS — I10 Essential (primary) hypertension: Secondary | ICD-10-CM

## 2011-10-04 LAB — LIPID PANEL
Cholesterol: 209 mg/dL — ABNORMAL HIGH (ref 0–200)
HDL: 46 mg/dL (ref >39)
LDL Cholesterol: 120 mg/dL — ABNORMAL HIGH (ref 0–99)
Total CHOL/HDL Ratio: 4.5 Ratio
Triglycerides: 217 mg/dL — ABNORMAL HIGH (ref <150)
VLDL: 43 mg/dL — ABNORMAL HIGH (ref 0–40)

## 2011-10-04 LAB — COMPREHENSIVE METABOLIC PANEL
ALT: 35 U/L (ref 0–35)
AST: 33 U/L (ref 0–37)
Albumin: 4.5 g/dL (ref 3.5–5.2)
Alkaline Phosphatase: 31 U/L — ABNORMAL LOW (ref 39–117)
BUN: 20 mg/dL (ref 6–23)
CO2: 26 mEq/L (ref 19–32)
Calcium: 9.9 mg/dL (ref 8.4–10.5)
Chloride: 104 mEq/L (ref 96–112)
Creat: 1.04 mg/dL (ref 0.50–1.10)
Glucose, Bld: 113 mg/dL — ABNORMAL HIGH (ref 70–99)
Potassium: 3.7 mEq/L (ref 3.5–5.3)
Sodium: 139 mEq/L (ref 135–145)
Total Bilirubin: 0.7 mg/dL (ref 0.3–1.2)
Total Protein: 7.1 g/dL (ref 6.0–8.3)

## 2011-10-04 LAB — T4, FREE: Free T4: 1.09 ng/dL (ref 0.80–1.80)

## 2011-10-04 LAB — POCT SEDIMENTATION RATE: POCT SED RATE: 14 mm/hr (ref 0–22)

## 2011-10-04 LAB — CK: Total CK: 40 U/L (ref 7–177)

## 2011-10-04 LAB — TSH: TSH: 0.316 u[IU]/mL — ABNORMAL LOW (ref 0.350–4.500)

## 2011-10-04 MED ORDER — LOSARTAN POTASSIUM-HCTZ 50-12.5 MG PO TABS: 1.0000 | ORAL_TABLET | Freq: Every day | ORAL | Status: DC

## 2011-10-04 MED ORDER — LEVOTHYROXINE SODIUM 25 MCG PO TABS: 25.0000 ug | ORAL_TABLET | Freq: Every day | ORAL | Status: DC

## 2011-10-04 MED ORDER — GEMFIBROZIL 600 MG PO TABS: ORAL_TABLET | ORAL | Status: DC

## 2011-10-04 NOTE — Progress Notes (Signed)
51 yo woman who returns from cardiologist.  She has some questions about having been given nitroglycerine during stress test.  Also, she was confused about her cholesterol results.  On pravastatin since March 1st. Needs to change meds due to expense Persistent thigh and now hip aching in the morning Getting dialectical training at [facility] and seeing Dr. Donell Beers Doing yoga with medication at hs. No new supplements. Whitney had moved out, had episode of DKA which landed her in ED.  Diane took her back home.  Whitney went back to [place] to work things out with her husband.  Now Alphonzo Lemmings is calling for help.  O:  BP 130/90 Results for orders placed in visit on 08/30/11  COMPREHENSIVE METABOLIC PANEL      Component Value Range   Sodium 138  135 - 145 (mEq/L)   Potassium 3.9  3.5 - 5.3 (mEq/L)   Chloride 105  96 - 112 (mEq/L)   CO2 24  19 - 32 (mEq/L)   Glucose, Bld 89  70 - 99 (mg/dL)   BUN 20  6 - 23 (mg/dL)   Creat 0.98  1.19 - 1.47 (mg/dL)   Total Bilirubin 0.6  0.3 - 1.2 (mg/dL)   Alkaline Phosphatase 31 (*) 39 - 117 (U/L)   AST 23  0 - 37 (U/L)   ALT 27  0 - 35 (U/L)   Total Protein 7.3  6.0 - 8.3 (g/dL)   Albumin 4.4  3.5 - 5.2 (g/dL)   Calcium 82.9  8.4 - 10.5 (mg/dL)  TSH      Component Value Range   TSH 0.132 (*) 0.350 - 4.500 (uIU/mL)  LIPID PANEL      Component Value Range   Cholesterol 268 (*) 0 - 200 (mg/dL)   Triglycerides 562 (*) <150 (mg/dL)   HDL 46  >13 (mg/dL)   Total CHOL/HDL Ratio 5.8     VLDL 51 (*) 0 - 40 (mg/dL)   LDL Cholesterol 086 (*) 0 - 99 (mg/dL)  CBC      Component Value Range   WBC 6.4  4.0 - 10.5 (K/uL)   RBC 4.55  3.87 - 5.11 (MIL/uL)   Hemoglobin 13.5  12.0 - 15.0 (g/dL)   HCT 57.8  46.9 - 62.9 (%)   MCV 86.6  78.0 - 100.0 (fL)   MCH 29.7  26.0 - 34.0 (pg)   MCHC 34.3  30.0 - 36.0 (g/dL)   RDW 52.8  41.3 - 24.4 (%)   Platelets 357  150 - 400 (K/uL)  SEDIMENTATION RATE      Component Value Range   Sed Rate 12  0 - 22 (mm/hr)  CK   Component Value Range   Total CK 38  7 - 177 (U/L)  POCT URINALYSIS DIPSTICK      Component Value Range   Color, UA yellow     Clarity, UA clear     Glucose, UA neg     Bilirubin, UA neg     Ketones, UA neg     Spec Grav, UA 1.025     Blood, UA trace     pH, UA 6.0     Protein, UA neg     Urobilinogen, UA 0.2     Nitrite, UA neg     Leukocytes, UA Negative     Neck: barely palpable thyroid fullness  Chest:  Clear Heart:  Reg, no gallop I spent 45 minutes with patient listening to her many problems:  Confusion about heart,  myalgias, stress with Whitney, unaffordability of meds, possible thyroid nodule, inability to lose weight despite nutrition counseling. A:  Hyperlipidemia; unexplained chest pains;  Leg aching which is unexplained; need to change meds because of unaffordability of meds (doughnut hole).   P:  Check cpk, lipids I will discuss situation with her specialists I changed her more expensive medicine to generic I will order a thyroid u/s.

## 2011-10-06 ENCOUNTER — Other Ambulatory Visit: Payer: Self-pay | Admitting: Family Medicine

## 2011-10-06 MED ORDER — ALBUTEROL SULFATE HFA 108 (90 BASE) MCG/ACT IN AERS: 1.0000 | INHALATION_SPRAY | Freq: Four times a day (QID) | RESPIRATORY_TRACT | Status: DC | PRN

## 2011-10-08 ENCOUNTER — Telehealth: Payer: Self-pay

## 2011-10-08 DIAGNOSIS — F331 Major depressive disorder, recurrent, moderate: Secondary | ICD-10-CM | POA: Diagnosis not present

## 2011-10-08 DIAGNOSIS — I10 Essential (primary) hypertension: Secondary | ICD-10-CM

## 2011-10-08 DIAGNOSIS — E785 Hyperlipidemia, unspecified: Secondary | ICD-10-CM

## 2011-10-08 DIAGNOSIS — E039 Hypothyroidism, unspecified: Secondary | ICD-10-CM

## 2011-10-08 MED ORDER — GEMFIBROZIL 600 MG PO TABS: 600.0000 mg | ORAL_TABLET | Freq: Every day | ORAL | Status: DC

## 2011-10-08 MED ORDER — DOXYCYCLINE HYCLATE 100 MG PO CAPS: 100.0000 mg | ORAL_CAPSULE | Freq: Two times a day (BID) | ORAL | Status: AC

## 2011-10-08 MED ORDER — LEVOTHYROXINE SODIUM 25 MCG PO TABS: 25.0000 ug | ORAL_TABLET | Freq: Every day | ORAL | Status: DC

## 2011-10-08 MED ORDER — LOSARTAN POTASSIUM-HCTZ 50-12.5 MG PO TABS: 1.0000 | ORAL_TABLET | Freq: Every day | ORAL | Status: DC

## 2011-10-08 NOTE — Telephone Encounter (Signed)
Pt called to make sure that the Rxs Dr L had sent in for her at 10/04/11 OV went to [facility] mail order. Also talked with [facility] bc of having to keep cost down (pt in donut hole) about her Doxy Rx and was told that the capsule costs a lot less than the tablet that Rx was written and PA had been done for. Pt stated that she has always taken the capsule anyway. Can we change this Rx to capsule? Checked Rx sent on 4/4 and they were sent to the local pharmacy. I will d/c Rxs locally and ERx all 4 prescriptions to [facility]

## 2011-10-09 ENCOUNTER — Other Ambulatory Visit: Payer: Self-pay | Admitting: Family Medicine

## 2011-10-09 NOTE — Telephone Encounter (Signed)
Dr. Milus Glazier - in OV you stated that you wanted to speak with the cardiologist before you changed her medications - so could you please decide about this medication.

## 2011-10-10 ENCOUNTER — Ambulatory Visit
Admission: RE | Admit: 2011-10-10 | Discharge: 2011-10-10 | Disposition: A | Discharge status: Home / Self Care | Payer: Medicare Other | Source: Ambulatory Visit | Attending: Family Medicine | Admitting: Family Medicine

## 2011-10-10 DIAGNOSIS — M9981 Other biomechanical lesions of cervical region: Secondary | ICD-10-CM | POA: Diagnosis not present

## 2011-10-10 DIAGNOSIS — S139XXA Sprain of joints and ligaments of unspecified parts of neck, initial encounter: Secondary | ICD-10-CM | POA: Diagnosis not present

## 2011-10-10 DIAGNOSIS — E041 Nontoxic single thyroid nodule: Secondary | ICD-10-CM

## 2011-10-10 DIAGNOSIS — E042 Nontoxic multinodular goiter: Secondary | ICD-10-CM | POA: Diagnosis not present

## 2011-10-11 ENCOUNTER — Other Ambulatory Visit: Payer: Self-pay | Admitting: Family Medicine

## 2011-10-11 DIAGNOSIS — F331 Major depressive disorder, recurrent, moderate: Secondary | ICD-10-CM | POA: Diagnosis not present

## 2011-10-11 DIAGNOSIS — E049 Nontoxic goiter, unspecified: Secondary | ICD-10-CM

## 2011-10-11 NOTE — Telephone Encounter (Signed)
I spoke with Dr. Antoine Poche.  He really does not think there is any significant heart disease despite the atypical nature of the pain and the use of nitroglycerine during the stress test.  He also feels the generic lipid lowering meds are safe and justified.  We should follow up with patient in 6 weeks.  Also, I referred patient to endocrine specialist because of the goiter, which appears benign, to see if there is any medicine to mitigate the possible enlarging nature of the problem

## 2011-10-12 ENCOUNTER — Telehealth: Payer: Self-pay

## 2011-10-12 DIAGNOSIS — F0390 Unspecified dementia without behavioral disturbance: Secondary | ICD-10-CM | POA: Diagnosis not present

## 2011-10-12 NOTE — Telephone Encounter (Signed)
Patient request ppw to be filled out and signed by Dr. Milus Glazier. Paperwork in [employer].

## 2011-10-15 DIAGNOSIS — F331 Major depressive disorder, recurrent, moderate: Secondary | ICD-10-CM | POA: Diagnosis not present

## 2011-10-15 NOTE — Telephone Encounter (Signed)
LMOM that form is ready for p/up. 

## 2011-10-17 DIAGNOSIS — M9981 Other biomechanical lesions of cervical region: Secondary | ICD-10-CM | POA: Diagnosis not present

## 2011-10-17 DIAGNOSIS — S139XXA Sprain of joints and ligaments of unspecified parts of neck, initial encounter: Secondary | ICD-10-CM | POA: Diagnosis not present

## 2011-10-18 DIAGNOSIS — F331 Major depressive disorder, recurrent, moderate: Secondary | ICD-10-CM | POA: Diagnosis not present

## 2011-10-29 DIAGNOSIS — F331 Major depressive disorder, recurrent, moderate: Secondary | ICD-10-CM | POA: Diagnosis not present

## 2011-10-30 ENCOUNTER — Ambulatory Visit (INDEPENDENT_AMBULATORY_CARE_PROVIDER_SITE_OTHER): Payer: No Typology Code available for payment source | Admitting: Internal Medicine

## 2011-10-30 VITALS — BP 108/72 | HR 80 | Temp 98.2°F | Resp 16 | Ht 62.5 in | Wt 191.6 lb

## 2011-10-30 DIAGNOSIS — R102 Pelvic and perineal pain: Secondary | ICD-10-CM

## 2011-10-30 DIAGNOSIS — N949 Unspecified condition associated with female genital organs and menstrual cycle: Secondary | ICD-10-CM | POA: Diagnosis not present

## 2011-10-30 DIAGNOSIS — R51 Headache: Secondary | ICD-10-CM

## 2011-10-30 DIAGNOSIS — R079 Chest pain, unspecified: Secondary | ICD-10-CM

## 2011-10-30 DIAGNOSIS — R61 Generalized hyperhidrosis: Secondary | ICD-10-CM | POA: Diagnosis not present

## 2011-10-30 DIAGNOSIS — R0789 Other chest pain: Secondary | ICD-10-CM

## 2011-10-30 LAB — POCT URINALYSIS DIPSTICK
Ketones, UA: NEGATIVE
Protein, UA: NEGATIVE
Spec Grav, UA: 1.01
pH, UA: 6.5

## 2011-10-30 LAB — POCT GLYCOSYLATED HEMOGLOBIN (HGB A1C): Hemoglobin A1C: 4.8

## 2011-10-30 LAB — POCT UA - MICROSCOPIC ONLY
Casts, Ur, LPF, POC: NEGATIVE
Crystals, Ur, HPF, POC: NEGATIVE
Mucus, UA: NEGATIVE
Yeast, UA: NEGATIVE

## 2011-10-30 LAB — POCT CBC
Granulocyte percent: 53 %G (ref 37–80)
HCT, POC: 40.3 % (ref 37.7–47.9)
Hemoglobin: 13.4 g/dL (ref 12.2–16.2)
MCV: 87.2 fL (ref 80–97)
Platelet Count, POC: 369 10*3/uL (ref 142–424)
RBC: 4.62 M/uL (ref 4.04–5.48)

## 2011-10-30 LAB — GLUCOSE, POCT (MANUAL RESULT ENTRY): POC Glucose: 76

## 2011-10-30 MED ORDER — HYDROCODONE-ACETAMINOPHEN 5-500 MG PO TABS: 1.0000 | ORAL_TABLET | Freq: Three times a day (TID) | ORAL | Status: AC | PRN

## 2011-10-30 NOTE — Progress Notes (Signed)
Subjective:    Patient ID: Tamara [name], female    DOB: [birth year], 51 y.o.   MRN: [id number]  HPI Has severe HA for 3d, minor HA for months, Has chest pain to left arm and diaphoresis. Home blood pressures are 113--127/75--92, basically normal. Has freq. Of urination, and bladder pain.  Has gained lots of weight and on many CNS meds ande new pravastatin. Serotonin syndrome discussed and will discuss with him. Had stress test with Dr. Antoine Poche March. Report reviewed and was normal myoview. Chest pain is positional and not assoc with exercise. No chest pain with Yoga!   Review of Systems Hx migrains    Objective:   Physical Exam  Constitutional: She is oriented to person, place, and time. She appears well-nourished. No distress.  HENT:  Right Ear: External ear normal.  Left Ear: External ear normal.  Nose: Nose normal.  Mouth/Throat: Oropharynx is clear and moist.  Eyes: EOM are normal. Pupils are equal, round, and reactive to light.  Neck: Normal range of motion. Neck supple. No thyromegaly present.  Cardiovascular: Normal rate, regular rhythm, normal heart sounds and intact distal pulses.   Pulmonary/Chest: Effort normal and breath sounds normal.  Abdominal: Soft. Bowel sounds are normal. There is tenderness.       Tender over bladder  Lymphadenopathy:    She has no cervical adenopathy.  Neurological: She is alert and oriented to person, place, and time. She has normal reflexes. She is not disoriented. She displays normal reflexes. No cranial nerve deficit or sensory deficit. She exhibits normal muscle tone. She displays a negative Romberg sign. Coordination and gait normal. She displays no Babinski's sign on the right side. She displays no Babinski's sign on the left side.       Good balance , no drift  Skin: Skin is warm. She is diaphoretic.  Psychiatric: She has a normal mood and affect. Her behavior is normal. Judgment and thought content normal.   Labs  Results for  orders placed in visit on 10/30/11  POCT URINALYSIS DIPSTICK      Component Value Range   Color, UA yellow     Clarity, UA sl. cloudy     Glucose, UA neg     Bilirubin, UA neg     Ketones, UA neg     Spec Grav, UA 1.010     Blood, UA neg     pH, UA 6.5     Protein, UA neg     Urobilinogen, UA 0.2     Nitrite, UA neg     Leukocytes, UA Negative    POCT UA - MICROSCOPIC ONLY      Component Value Range   WBC, Ur, HPF, POC 0-1     RBC, urine, microscopic neg     Bacteria, U Microscopic 3+     Mucus, UA neg     Epithelial cells, urine per micros 0-5     Crystals, Ur, HPF, POC neg     Casts, Ur, LPF, POC neg     Yeast, UA neg    POCT CBC      Component Value Range   WBC 9.6  4.6 - 10.2 (K/uL)   Lymph, poc 3.8 (*) 0.6 - 3.4    POC LYMPH PERCENT 40.1  10 - 50 (%L)   MID (cbc) 0.7  0 - 0.9    POC MID % 6.9  0 - 12 (%M)   POC Granulocyte 5.1  2 - 6.9  Granulocyte percent 53.0  37 - 80 (%G)   RBC 4.62  4.04 - 5.48 (M/uL)   Hemoglobin 13.4  12.2 - 16.2 (g/dL)   HCT, POC 78.2  95.6 - 47.9 (%)   MCV 87.2  80 - 97 (fL)   MCH, POC 29.0  27 - 31.2 (pg)   MCHC 33.3  31.8 - 35.4 (g/dL)   RDW, POC 21.3     Platelet Count, POC 369  142 - 424 (K/uL)   MPV 9.1  0 - 99.8 (fL)  GLUCOSE, POCT (MANUAL RESULT ENTRY)      Component Value Range   POC Glucose 76    POCT GLYCOSYLATED HEMOGLOBIN (HGB A1C)      Component Value Range   Hemoglobin A1C 4.8      EKG same as last ekg       Assessment & Plan:  HA probable Migraine possibly due to insomnia Chest pain probably not cardiac Diaphoresis cause unclear, could be anxiety See Drs Donell Beers and Lauenstein Hold new med pravastatin trial Vicodin for pain

## 2011-10-30 NOTE — Patient Instructions (Signed)

## 2011-10-31 DIAGNOSIS — S139XXA Sprain of joints and ligaments of unspecified parts of neck, initial encounter: Secondary | ICD-10-CM | POA: Diagnosis not present

## 2011-10-31 DIAGNOSIS — M9981 Other biomechanical lesions of cervical region: Secondary | ICD-10-CM | POA: Diagnosis not present

## 2011-11-01 ENCOUNTER — Ambulatory Visit
Admission: RE | Admit: 2011-11-01 | Discharge: 2011-11-01 | Disposition: A | Discharge status: Home / Self Care | Payer: Medicare Other | Source: Ambulatory Visit | Attending: Obstetrics & Gynecology | Admitting: Obstetrics & Gynecology

## 2011-11-01 DIAGNOSIS — R928 Other abnormal and inconclusive findings on diagnostic imaging of breast: Secondary | ICD-10-CM | POA: Diagnosis not present

## 2011-11-01 DIAGNOSIS — N63 Unspecified lump in unspecified breast: Secondary | ICD-10-CM

## 2011-11-01 DIAGNOSIS — F331 Major depressive disorder, recurrent, moderate: Secondary | ICD-10-CM | POA: Diagnosis not present

## 2011-11-05 DIAGNOSIS — F331 Major depressive disorder, recurrent, moderate: Secondary | ICD-10-CM | POA: Diagnosis not present

## 2011-11-07 DIAGNOSIS — S139XXA Sprain of joints and ligaments of unspecified parts of neck, initial encounter: Secondary | ICD-10-CM | POA: Diagnosis not present

## 2011-11-07 DIAGNOSIS — M9981 Other biomechanical lesions of cervical region: Secondary | ICD-10-CM | POA: Diagnosis not present

## 2011-11-12 DIAGNOSIS — F331 Major depressive disorder, recurrent, moderate: Secondary | ICD-10-CM | POA: Diagnosis not present

## 2011-11-13 ENCOUNTER — Other Ambulatory Visit: Payer: Self-pay | Admitting: Pharmacist

## 2011-11-13 DIAGNOSIS — E785 Hyperlipidemia, unspecified: Secondary | ICD-10-CM

## 2011-11-15 ENCOUNTER — Encounter: Payer: Self-pay | Admitting: Cardiology

## 2011-11-15 DIAGNOSIS — R946 Abnormal results of thyroid function studies: Secondary | ICD-10-CM | POA: Diagnosis not present

## 2011-11-15 DIAGNOSIS — F331 Major depressive disorder, recurrent, moderate: Secondary | ICD-10-CM | POA: Diagnosis not present

## 2011-11-15 DIAGNOSIS — Z8632 Personal history of gestational diabetes: Secondary | ICD-10-CM | POA: Diagnosis not present

## 2011-11-15 DIAGNOSIS — Z833 Family history of diabetes mellitus: Secondary | ICD-10-CM | POA: Diagnosis not present

## 2011-11-15 DIAGNOSIS — E042 Nontoxic multinodular goiter: Secondary | ICD-10-CM | POA: Diagnosis not present

## 2011-11-15 DIAGNOSIS — Z8349 Family history of other endocrine, nutritional and metabolic diseases: Secondary | ICD-10-CM | POA: Diagnosis not present

## 2011-11-15 DIAGNOSIS — E785 Hyperlipidemia, unspecified: Secondary | ICD-10-CM | POA: Diagnosis not present

## 2011-11-20 ENCOUNTER — Ambulatory Visit (INDEPENDENT_AMBULATORY_CARE_PROVIDER_SITE_OTHER): Payer: Medicare Other | Admitting: Family Medicine

## 2011-11-20 ENCOUNTER — Ambulatory Visit (INDEPENDENT_AMBULATORY_CARE_PROVIDER_SITE_OTHER): Payer: Medicare Other | Admitting: Pharmacist

## 2011-11-20 VITALS — BP 112/78 | HR 97 | Temp 98.1°F | Resp 18 | Ht 62.0 in | Wt 188.0 lb

## 2011-11-20 DIAGNOSIS — E785 Hyperlipidemia, unspecified: Secondary | ICD-10-CM

## 2011-11-20 DIAGNOSIS — R202 Paresthesia of skin: Secondary | ICD-10-CM

## 2011-11-20 DIAGNOSIS — R209 Unspecified disturbances of skin sensation: Secondary | ICD-10-CM

## 2011-11-20 DIAGNOSIS — R519 Headache, unspecified: Secondary | ICD-10-CM

## 2011-11-20 MED ORDER — KETOPROFEN 50 MG PO CAPS: 50.0000 mg | ORAL_CAPSULE | Freq: Four times a day (QID) | ORAL | Status: DC | PRN

## 2011-11-20 NOTE — Patient Instructions (Addendum)
When you change to gemfibrozil, try to take 2 tablets daily.   Start Niacin 500mg  once daily.    Try to exercise at least 30 minutes 3-4 times per week.   Recheck labs in 2 months.

## 2011-11-20 NOTE — Progress Notes (Signed)
This 51 year old woman comes in with a couple different complaints. First of all she has had an increasing frequency of occipital headaches. These are not incapacitating and not like her past migraines. They seem to come on the morning, he's off during the day and then return in the evening.  Patient's also concerned about paresthesias in her left side of her body burning from her left arm and left side down to her left leg. She's been having these intermittently for the last several weeks. It's gotten a little bit worse and so she wanted make sure that this was not something serious. She's had a variety of medical tests in the last few weeks including thyroid and electrolyte tests which are been normal.  This afternoon she has an appointment to review her cholesterol strategies. She recently taking pravastatin and this caused severe headaches. Dr. Product/process development scientist at the pravastatin gave her some hydrocodone. He does serious headaches have resolved but she is very reluctant to take further statins, particularly since he's had problems with Crestor in the past although this was when she was taking lithium.   Results for orders placed in visit on 10/30/11  POCT URINALYSIS DIPSTICK      Component Value Range   Color, UA yellow     Clarity, UA sl. cloudy     Glucose, UA neg     Bilirubin, UA neg     Ketones, UA neg     Spec Grav, UA 1.010     Blood, UA neg     pH, UA 6.5     Protein, UA neg     Urobilinogen, UA 0.2     Nitrite, UA neg     Leukocytes, UA Negative    POCT UA - MICROSCOPIC ONLY      Component Value Range   WBC, Ur, HPF, POC 0-1     RBC, urine, microscopic neg     Bacteria, U Microscopic 3+     Mucus, UA neg     Epithelial cells, urine per micros 0-5     Crystals, Ur, HPF, POC neg     Casts, Ur, LPF, POC neg     Yeast, UA neg    POCT CBC      Component Value Range   WBC 9.6  4.6 - 10.2 (K/uL)   Lymph, poc 3.8 (*) 0.6 - 3.4    POC LYMPH PERCENT 40.1  10 - 50 (%L)   MID (cbc)  0.7  0 - 0.9    POC MID % 6.9  0 - 12 (%M)   POC Granulocyte 5.1  2 - 6.9    Granulocyte percent 53.0  37 - 80 (%G)   RBC 4.62  4.04 - 5.48 (M/uL)   Hemoglobin 13.4  12.2 - 16.2 (g/dL)   HCT, POC 40.9  81.1 - 47.9 (%)   MCV 87.2  80 - 97 (fL)   MCH, POC 29.0  27 - 31.2 (pg)   MCHC 33.3  31.8 - 35.4 (g/dL)   RDW, POC 91.4     Platelet Count, POC 369  142 - 424 (K/uL)   MPV 9.1  0 - 99.8 (fL)  POCT SEDIMENTATION RATE      Component Value Range   POCT SED RATE 7  0 - 22 (mm/hr)  GLUCOSE, POCT (MANUAL RESULT ENTRY)      Component Value Range   POC Glucose 76    POCT GLYCOSYLATED HEMOGLOBIN (HGB A1C)      Component  Value Range   Hemoglobin A1C 4.8     Patient is alert, pleasant and very talkative  HEENT: Unremarkable  Neck: No bruits, supple, no adenopathy or thyromegaly  Chest: Clear to auscultation  Heart: Regular, no gallop or murmur  Abdomen: Soft nontender without HSM, masses or tenderness  Neurologically: Normal mental status, normal cranial nerves III through XII, normal motor without muscle wasting or sensory changes, normal reflexes in all 4 extremities.  Assessment: Ms. Losier has had multiple tests 3 years. There is no evidence at this time that the paresthesias or headaches represent a serious problem. Because of this I reassured her that no further testing is necessary and that the symptoms are probably clear over time perhaps one or 2 months.  I do not think patient will do well on statins. She seems to have reactions to just about everything and to offer her statins now are probably from a monkey wrench into her well-being. Encourage her to continue with Dr. Brendia Sacks to manage her psychological issues and she is continuing with her dialectic therapy as well.  Plan: I've urged her to hold off on any further testing or medications, although I have prescribed some ketoprofen for her to take when necessary for her headaches. Also encouraged her not to take any new statin  medicines because I am sure she'll have her reaction to them for one reason or another. Instead I suggested that she discuss natural alternatives to cholesterol management when she sees her cholesterol seen this afternoon.  I spent 45 minutes with patient

## 2011-11-20 NOTE — Assessment & Plan Note (Addendum)
TC 262 (goal<200), TG 192 (goal<150), HDL 42 (goal>45) and LDL 181 (goal<70).  LFTs are WNL.  Lipid levels have increased since discontinuing pravastatin with mild decrease in TGs.  PCP has asked [place] not to start any statin at this time.  Will comply with this request although not positive migraines related to statin as patient has continued to have problems off statin.  Gemfibrozil was substituted for fenofibrate for cost but only prescribed once daily versus twice daily.  Patient advised that when starting the gemfibrozil that she start taking once daily and if no muscle pains, then increase to twice daily.  Encouraged patient to take time for herself and place emphasis on exercise through walking and yoga videos ( 3-4x/week).  Also encouraged patient to eat more green vegetables.  Patient expressed interest in trying niacin therapy, provided her with Niaspan 500mg  and advised to take once daily.  Follow-up in lipid clinic in 2 months.

## 2011-11-20 NOTE — Patient Instructions (Signed)
The lab tests you have had and the physical exam do not suggest a serious problem.  I suspect these symptoms will continue for awhile and then gradually subside.

## 2011-11-20 NOTE — Progress Notes (Signed)
HPI: Patient presenting to clinic today for follow-up on lipids.  Patient treated with fenofibrate 160mg  and was taking pravastatin 40mg  but discontinued by PCP due to increase in migraines.  PCP also changed fenfibrate to gemfibrozil due to cheaper cost of medication, however patient has not started taking this medication yet.  Physician recommended no changes in medications or labs at this time.  Patient expressed concern with the tingling/numbing in her arm, will be following up with chiropractor about arm.   Diet: Reports no change in her diet.  Has switched to whole wheat bread, no processed foods and no smoked foods.  Patient reports decrease in eggs, white foods (potatoes and rice) and sugars/sweets.   Breakfast: greek yogurt with granola, egg with cheese, grits or french toast Lunch: spinach salad with peppers, tomatoes, onions, cucumbers and yogurt dressing.  Reports occasional [employer] chicken supremes, egg rolls, roast beef, slaw or hot dogs with chili.   Dinner: pizza, sirloin hamburger steak, sweet potatoes, green beans, peas, lima beans, brussel sprouts.  Will sometimes have pork chops, roast, filet or chicken.    Exercise: Only reports bedtime yoga.  Patient working on walking more with her boyfriend that has MS.  Has had troubles starting to walk because the weather is not ideal to walk with her boyfriend.  Reports having yoga videos but has been too busy taking care of her father (stroke and carotid artery surgery this month) and hasn't had time to start doing the videos.    Current Outpatient Prescriptions  Medication Sig Dispense Refill  . albuterol (PROVENTIL HFA;VENTOLIN HFA) 108 (90 BASE) MCG/ACT inhaler Inhale 1 puff into the lungs as needed.      . ALPRAZolam (XANAX) 1 MG tablet Take 1 mg by mouth continuous as needed.      . ALPRAZOLAM XR 1 MG 24 hr tablet Take 3 mg by mouth at bedtime.       Marland Kitchen dexlansoprazole (DEXILANT) 60 MG capsule Take 1 capsule (60 mg total) by mouth  daily.  30 capsule  0  . fenofibrate 160 MG tablet TAKE 1 TABLET EVERY DAY  30 tablet  0  . gemfibrozil (LOPID) 600 MG tablet Take 1 tablet (600 mg total) by mouth daily. One daily  90 tablet  3  . ketoprofen (ORUDIS) 50 MG capsule Take 1 capsule (50 mg total) by mouth 4 (four) times daily as needed for pain.  30 capsule  0  . levothyroxine (LEVOTHROID) 25 MCG tablet Take 1 tablet (25 mcg total) by mouth daily.  90 tablet  3  . losartan-hydrochlorothiazide (HYZAAR) 50-12.5 MG per tablet Take 1 tablet by mouth daily.  90 tablet  3  . NON FORMULARY Take 4 mg by mouth as needed. Trilofan 4 mg      . Oxcarbazepine (TRILEPTAL) 300 MG tablet Take 300 mg by mouth 2 (two) times daily.      . pravastatin (PRAVACHOL) 40 MG tablet Take 1 tablet (40 mg total) by mouth every evening.  30 tablet  11  . temazepam (RESTORIL) 15 MG capsule Take 45 mg by mouth at bedtime as needed.       . Vilazodone HCl (VIIBRYD) 20 MG TABS Take by mouth daily.        Allergies  Allergen Reactions  . Gabapentin     Unknown reaction  . Iodine     Per patient: IVP dye  . Propoxyphene Hcl Other (See Comments)    hallucination  . Seroquel (Quetiapine Fumerate)  Unknown reaction  . Penicillins Rash  . Sulfonamide Derivatives Rash

## 2011-11-22 DIAGNOSIS — F331 Major depressive disorder, recurrent, moderate: Secondary | ICD-10-CM | POA: Diagnosis not present

## 2011-11-27 DIAGNOSIS — F331 Major depressive disorder, recurrent, moderate: Secondary | ICD-10-CM | POA: Diagnosis not present

## 2011-11-28 MED ORDER — NIACIN ER (ANTIHYPERLIPIDEMIC) 500 MG PO TBCR: 500.0000 mg | EXTENDED_RELEASE_TABLET | Freq: Every day | ORAL | Status: DC

## 2011-11-29 DIAGNOSIS — F331 Major depressive disorder, recurrent, moderate: Secondary | ICD-10-CM | POA: Diagnosis not present

## 2011-12-06 DIAGNOSIS — F331 Major depressive disorder, recurrent, moderate: Secondary | ICD-10-CM | POA: Diagnosis not present

## 2011-12-08 ENCOUNTER — Telehealth: Payer: Self-pay

## 2011-12-08 MED ORDER — HYDROCODONE-ACETAMINOPHEN 5-500 MG PO TABS: 1.0000 | ORAL_TABLET | Freq: Three times a day (TID) | ORAL | Status: DC | PRN

## 2011-12-08 NOTE — Telephone Encounter (Signed)
Patient notified and rx faxed in 

## 2011-12-08 NOTE — Telephone Encounter (Signed)
Pt called would like to get refill on prescription given by Dr Perrin Maltese - Hydrocodon. Pt stated this one  Helps more than any other given. Please let pt know. [phone] best [facility] [address]

## 2011-12-08 NOTE — Telephone Encounter (Signed)
Done and printed

## 2011-12-08 NOTE — Telephone Encounter (Signed)
Patient given Vicodin rx for HAs on 4/30, then received Ketoprofen on 5/21 for HA... Would like refill on Vicodin since it works better... Can we rx?

## 2011-12-10 DIAGNOSIS — F331 Major depressive disorder, recurrent, moderate: Secondary | ICD-10-CM | POA: Diagnosis not present

## 2011-12-13 DIAGNOSIS — F331 Major depressive disorder, recurrent, moderate: Secondary | ICD-10-CM | POA: Diagnosis not present

## 2011-12-17 DIAGNOSIS — F331 Major depressive disorder, recurrent, moderate: Secondary | ICD-10-CM | POA: Diagnosis not present

## 2011-12-19 DIAGNOSIS — F05 Delirium due to known physiological condition: Secondary | ICD-10-CM | POA: Diagnosis not present

## 2011-12-19 DIAGNOSIS — F0390 Unspecified dementia without behavioral disturbance: Secondary | ICD-10-CM | POA: Diagnosis not present

## 2011-12-24 ENCOUNTER — Telehealth: Payer: Self-pay

## 2011-12-24 NOTE — Telephone Encounter (Signed)
Needs to talk to Dr. Elbert Ewings about her new cholesterol medicine.  It is causing problems

## 2011-12-25 NOTE — Telephone Encounter (Signed)
Noted. Will forward to Dr. Elbert Ewings as an Lorain Childes.

## 2011-12-25 NOTE — Telephone Encounter (Signed)
Patient called back about the medicine.  She has quit taking it completely due to the problems its causing.  No one called yesterday and Dr. Elbert Ewings is out today.  Can someone please review this and call her?

## 2011-12-25 NOTE — Telephone Encounter (Signed)
Spoke with patient--she states that Dr. Elbert Ewings had changed her from Fenofibrate to Lopid due to decreased cost.  She began taking this around one week ago, and has been having severe HAs, slight lip swelling, cough, and red bumps on her stomach and legs.  Not SOB.  Patient stopped this medication this am, and doesn't feel that she is any distress.  Advised patient to RTC for eval before any medication changes could be made.  Patient plans to RTC tomorrow and see Dr. L---strongly encouraged that if symptoms worsen to come in sooner or go to ER.  Patient understood.  To PAs - FYI

## 2011-12-26 ENCOUNTER — Ambulatory Visit (INDEPENDENT_AMBULATORY_CARE_PROVIDER_SITE_OTHER): Payer: Medicare Other | Admitting: Family Medicine

## 2011-12-26 VITALS — BP 95/64 | HR 88 | Temp 98.3°F | Resp 18 | Ht 62.5 in | Wt 188.2 lb

## 2011-12-26 DIAGNOSIS — E785 Hyperlipidemia, unspecified: Secondary | ICD-10-CM

## 2011-12-26 DIAGNOSIS — M25559 Pain in unspecified hip: Secondary | ICD-10-CM

## 2011-12-26 DIAGNOSIS — R21 Rash and other nonspecific skin eruption: Secondary | ICD-10-CM | POA: Diagnosis not present

## 2011-12-26 DIAGNOSIS — M25552 Pain in left hip: Secondary | ICD-10-CM

## 2011-12-26 LAB — COMPREHENSIVE METABOLIC PANEL
ALT: 25 U/L (ref 0–35)
AST: 26 U/L (ref 0–37)
Albumin: 4.7 g/dL (ref 3.5–5.2)
Alkaline Phosphatase: 35 U/L — ABNORMAL LOW (ref 39–117)
BUN: 20 mg/dL (ref 6–23)
CO2: 26 mEq/L (ref 19–32)
Calcium: 10 mg/dL (ref 8.4–10.5)
Chloride: 102 mEq/L (ref 96–112)
Creat: 1.05 mg/dL (ref 0.50–1.10)
Glucose, Bld: 98 mg/dL (ref 70–99)
Potassium: 3.8 mEq/L (ref 3.5–5.3)
Sodium: 137 mEq/L (ref 135–145)
Total Bilirubin: 0.6 mg/dL (ref 0.3–1.2)
Total Protein: 7.4 g/dL (ref 6.0–8.3)

## 2011-12-26 LAB — LIPID PANEL
Cholesterol: 268 mg/dL — ABNORMAL HIGH (ref 0–200)
HDL: 45 mg/dL (ref >39)
LDL Cholesterol: 159 mg/dL — ABNORMAL HIGH (ref 0–99)
Total CHOL/HDL Ratio: 6 Ratio
Triglycerides: 318 mg/dL — ABNORMAL HIGH (ref <150)
VLDL: 64 mg/dL — ABNORMAL HIGH (ref 0–40)

## 2011-12-26 LAB — POCT SEDIMENTATION RATE: POCT SED RATE: 8 mm/hr (ref 0–22)

## 2011-12-26 NOTE — Progress Notes (Signed)
S:  Left hip pain over a month(2-3 months), worse with weight bearing.    Quit gemfibrozil and fenofibrate, because of insurance expense and recent rash (onset 5 days ago) on right dorsal hand and lower anterior shin.  The rash begins as a pinpoint papule.  The rash is itching.  O:  NAD Eczematous right dorsal hand rash, pinpoint papules right biceps area and bilateral anterior shins. Hip palpation: tender over greater trochanter, full range of motion, nontender SI joint  Assessment:  Allergic rash???, mild hip bursitis, intolerant of fenfibrate, chronic hyperlipidemia.  1. Rash  POCT SEDIMENTATION RATE  2. Hip pain, left    3. Hyperlipidemia  Lipid panel, Comprehensive metabolic panel

## 2011-12-27 ENCOUNTER — Other Ambulatory Visit: Payer: Self-pay | Admitting: Family Medicine

## 2011-12-27 DIAGNOSIS — F331 Major depressive disorder, recurrent, moderate: Secondary | ICD-10-CM | POA: Diagnosis not present

## 2011-12-27 DIAGNOSIS — E785 Hyperlipidemia, unspecified: Secondary | ICD-10-CM

## 2011-12-28 ENCOUNTER — Telehealth: Payer: Self-pay

## 2011-12-28 NOTE — Telephone Encounter (Signed)
Pt had LM on RN VM worried bc she had gotten a call from the endocrinologist to set up appt d/t labs that he received from [place], but hadn't heard from [place] yet. Explained to pt that we sent a letter out yesterday and D/W pt results of labs and Dr Cain Saupe note. Pt agreed to f/up w/endocrinologist.

## 2011-12-31 DIAGNOSIS — F331 Major depressive disorder, recurrent, moderate: Secondary | ICD-10-CM | POA: Diagnosis not present

## 2012-01-07 DIAGNOSIS — F331 Major depressive disorder, recurrent, moderate: Secondary | ICD-10-CM | POA: Diagnosis not present

## 2012-01-08 DIAGNOSIS — Z833 Family history of diabetes mellitus: Secondary | ICD-10-CM | POA: Diagnosis not present

## 2012-01-08 DIAGNOSIS — E042 Nontoxic multinodular goiter: Secondary | ICD-10-CM | POA: Diagnosis not present

## 2012-01-08 DIAGNOSIS — E785 Hyperlipidemia, unspecified: Secondary | ICD-10-CM | POA: Diagnosis not present

## 2012-01-08 DIAGNOSIS — Z8632 Personal history of gestational diabetes: Secondary | ICD-10-CM | POA: Diagnosis not present

## 2012-01-08 DIAGNOSIS — Z8349 Family history of other endocrine, nutritional and metabolic diseases: Secondary | ICD-10-CM | POA: Diagnosis not present

## 2012-01-08 DIAGNOSIS — R946 Abnormal results of thyroid function studies: Secondary | ICD-10-CM | POA: Diagnosis not present

## 2012-01-09 DIAGNOSIS — F331 Major depressive disorder, recurrent, moderate: Secondary | ICD-10-CM | POA: Diagnosis not present

## 2012-01-14 DIAGNOSIS — F331 Major depressive disorder, recurrent, moderate: Secondary | ICD-10-CM | POA: Diagnosis not present

## 2012-01-16 DIAGNOSIS — F331 Major depressive disorder, recurrent, moderate: Secondary | ICD-10-CM | POA: Diagnosis not present

## 2012-01-21 DIAGNOSIS — F331 Major depressive disorder, recurrent, moderate: Secondary | ICD-10-CM | POA: Diagnosis not present

## 2012-01-22 ENCOUNTER — Ambulatory Visit: Payer: Medicare Other | Admitting: Pharmacist

## 2012-01-23 DIAGNOSIS — F331 Major depressive disorder, recurrent, moderate: Secondary | ICD-10-CM | POA: Diagnosis not present

## 2012-01-25 DIAGNOSIS — M9981 Other biomechanical lesions of cervical region: Secondary | ICD-10-CM | POA: Diagnosis not present

## 2012-01-25 DIAGNOSIS — S139XXA Sprain of joints and ligaments of unspecified parts of neck, initial encounter: Secondary | ICD-10-CM | POA: Diagnosis not present

## 2012-01-28 DIAGNOSIS — F331 Major depressive disorder, recurrent, moderate: Secondary | ICD-10-CM | POA: Diagnosis not present

## 2012-02-05 DIAGNOSIS — F331 Major depressive disorder, recurrent, moderate: Secondary | ICD-10-CM | POA: Diagnosis not present

## 2012-02-07 DIAGNOSIS — F331 Major depressive disorder, recurrent, moderate: Secondary | ICD-10-CM | POA: Diagnosis not present

## 2012-03-13 ENCOUNTER — Other Ambulatory Visit: Payer: Self-pay | Admitting: Obstetrics & Gynecology

## 2012-03-13 DIAGNOSIS — Z1231 Encounter for screening mammogram for malignant neoplasm of breast: Secondary | ICD-10-CM

## 2012-03-27 DIAGNOSIS — F0390 Unspecified dementia without behavioral disturbance: Secondary | ICD-10-CM | POA: Diagnosis not present

## 2012-03-27 DIAGNOSIS — F05 Delirium due to known physiological condition: Secondary | ICD-10-CM | POA: Diagnosis not present

## 2012-04-09 DIAGNOSIS — D235 Other benign neoplasm of skin of trunk: Secondary | ICD-10-CM | POA: Diagnosis not present

## 2012-04-09 DIAGNOSIS — L259 Unspecified contact dermatitis, unspecified cause: Secondary | ICD-10-CM | POA: Diagnosis not present

## 2012-04-09 DIAGNOSIS — L905 Scar conditions and fibrosis of skin: Secondary | ICD-10-CM | POA: Diagnosis not present

## 2012-04-10 ENCOUNTER — Encounter: Payer: Self-pay | Admitting: Family Medicine

## 2012-04-10 ENCOUNTER — Ambulatory Visit: Payer: Medicare Other

## 2012-04-10 ENCOUNTER — Ambulatory Visit (INDEPENDENT_AMBULATORY_CARE_PROVIDER_SITE_OTHER): Payer: Medicare Other | Admitting: Family Medicine

## 2012-04-10 VITALS — HR 83 | Temp 98.3°F | Resp 16 | Ht 63.0 in | Wt 179.0 lb

## 2012-04-10 DIAGNOSIS — G8250 Quadriplegia, unspecified: Secondary | ICD-10-CM

## 2012-04-10 DIAGNOSIS — R8281 Pyuria: Secondary | ICD-10-CM

## 2012-04-10 DIAGNOSIS — M76899 Other specified enthesopathies of unspecified lower limb, excluding foot: Secondary | ICD-10-CM

## 2012-04-10 DIAGNOSIS — S336XXA Sprain of sacroiliac joint, initial encounter: Secondary | ICD-10-CM

## 2012-04-10 DIAGNOSIS — W19XXXA Unspecified fall, initial encounter: Secondary | ICD-10-CM

## 2012-04-10 DIAGNOSIS — M7072 Other bursitis of hip, left hip: Secondary | ICD-10-CM

## 2012-04-10 DIAGNOSIS — M25519 Pain in unspecified shoulder: Secondary | ICD-10-CM | POA: Diagnosis not present

## 2012-04-10 DIAGNOSIS — E785 Hyperlipidemia, unspecified: Secondary | ICD-10-CM

## 2012-04-10 DIAGNOSIS — M79609 Pain in unspecified limb: Secondary | ICD-10-CM

## 2012-04-10 DIAGNOSIS — R82998 Other abnormal findings in urine: Secondary | ICD-10-CM | POA: Diagnosis not present

## 2012-04-10 DIAGNOSIS — Z9189 Other specified personal risk factors, not elsewhere classified: Secondary | ICD-10-CM

## 2012-04-10 LAB — POCT URINALYSIS DIPSTICK
Bilirubin, UA: NEGATIVE
Blood, UA: NEGATIVE
Glucose, UA: NEGATIVE
Ketones, UA: NEGATIVE
Nitrite, UA: NEGATIVE
Protein, UA: NEGATIVE
Spec Grav, UA: 1.02
Urobilinogen, UA: 0.2
pH, UA: 6.5

## 2012-04-10 NOTE — Progress Notes (Signed)
51 yo woman with chronic left hip pain.  She is concerned about her bone density.  Seen at the lipid clinic at [facility], with appt due in 1 month  About three weeks ago she fell on right outstretched arm.  Initially, she iced it and was unable to write.  She had an aroma therapy class that weekend and used some oils on the injured arm and hand.  At the time of the massage/therapy she realized that her right shoulder, neck and flank were tender.  She received cortisone shot in back (right rhomboid area) yesterday.  Receiving medications through pharmaceutical assistance plans   Objective:  NAD. I spent 30 minutes reviewing patient's social and medical problems Mildly tender right ulnar styloid area without bony abnormality, swelling or ecchymosis Mildly tender subacromial area laterally on right Mildly tender right trapezius with normal neck ROM Mildly tender left trochanter with normal hip ROM Tender right SI joint, worse with right knee crossover. [facility] reading (PRIMARY) by  Dr. Milus Glazier:  Right wrist and shoulder normal. Results for orders placed in visit on 04/10/12  POCT URINALYSIS DIPSTICK      Component Value Range   Color, UA yellow     Clarity, UA clear     Glucose, UA neg     Bilirubin, UA neg     Ketones, UA neg     Spec Grav, UA 1.020     Blood, UA neg     pH, UA 6.5     Protein, UA neg     Urobilinogen, UA 0.2     Nitrite, UA neg     Leukocytes, UA small (1+)      Assessment: Left hip bursitis Fall with shoulder strain, wrist strain, trapezius strain on right.

## 2012-04-11 DIAGNOSIS — M999 Biomechanical lesion, unspecified: Secondary | ICD-10-CM | POA: Diagnosis not present

## 2012-04-11 LAB — LIPID PANEL
Cholesterol: 265 mg/dL — ABNORMAL HIGH (ref 0–200)
HDL: 42 mg/dL (ref >39)
LDL Cholesterol: 162 mg/dL — ABNORMAL HIGH (ref 0–99)
Total CHOL/HDL Ratio: 6.3 Ratio
Triglycerides: 303 mg/dL — ABNORMAL HIGH (ref <150)
VLDL: 61 mg/dL — ABNORMAL HIGH (ref 0–40)

## 2012-04-12 LAB — URINE CULTURE
Colony Count: NO GROWTH
Organism ID, Bacteria: NO GROWTH

## 2012-04-14 DIAGNOSIS — F331 Major depressive disorder, recurrent, moderate: Secondary | ICD-10-CM | POA: Diagnosis not present

## 2012-04-16 ENCOUNTER — Ambulatory Visit (INDEPENDENT_AMBULATORY_CARE_PROVIDER_SITE_OTHER): Payer: Medicare Other | Admitting: Family Medicine

## 2012-04-16 ENCOUNTER — Telehealth: Payer: Self-pay | Admitting: Radiology

## 2012-04-16 DIAGNOSIS — Z23 Encounter for immunization: Secondary | ICD-10-CM

## 2012-04-16 NOTE — Telephone Encounter (Signed)
Bone density needed, however no diagnosis to support this, please advise. The center where bone density is to be done needs dx to file with insurance, please advise. Amy   Order cancelled,will need to reorder when dx put in, I can do this.

## 2012-04-17 ENCOUNTER — Telehealth: Payer: Self-pay

## 2012-04-17 DIAGNOSIS — F331 Major depressive disorder, recurrent, moderate: Secondary | ICD-10-CM | POA: Diagnosis not present

## 2012-04-17 DIAGNOSIS — Z780 Asymptomatic menopausal state: Secondary | ICD-10-CM

## 2012-04-17 NOTE — Telephone Encounter (Signed)
cara from the breast center calling to say that the order in for this patient needs to be changed to post menopausal or menopausal in order for insurance to cover

## 2012-04-20 NOTE — Telephone Encounter (Signed)
Sent to scheduling pool in error, forwarding back to clinical

## 2012-04-21 ENCOUNTER — Ambulatory Visit
Admission: RE | Admit: 2012-04-21 | Discharge: 2012-04-21 | Disposition: A | Discharge status: Home / Self Care | Payer: Medicare Other | Source: Ambulatory Visit | Attending: Obstetrics & Gynecology | Admitting: Obstetrics & Gynecology

## 2012-04-21 DIAGNOSIS — Z1231 Encounter for screening mammogram for malignant neoplasm of breast: Secondary | ICD-10-CM

## 2012-04-21 NOTE — Addendum Note (Signed)
Addended by: Sheppard Plumber A on: 04/21/2012 02:21 PM   Modules accepted: Orders

## 2012-04-21 NOTE — Telephone Encounter (Signed)
Called [facility] to clarify what needs to be changed. I was advised that on pt's order for bone density test, we can change the Dx reason for test to "post-menopausal" since pt is 51 and has had a hysterectomy. Changed order for pt.

## 2012-04-23 DIAGNOSIS — M999 Biomechanical lesion, unspecified: Secondary | ICD-10-CM | POA: Diagnosis not present

## 2012-04-24 DIAGNOSIS — F331 Major depressive disorder, recurrent, moderate: Secondary | ICD-10-CM | POA: Diagnosis not present

## 2012-04-28 ENCOUNTER — Other Ambulatory Visit: Payer: Self-pay | Admitting: Obstetrics & Gynecology

## 2012-04-28 DIAGNOSIS — R928 Other abnormal and inconclusive findings on diagnostic imaging of breast: Secondary | ICD-10-CM

## 2012-04-30 DIAGNOSIS — F331 Major depressive disorder, recurrent, moderate: Secondary | ICD-10-CM | POA: Diagnosis not present

## 2012-04-30 DIAGNOSIS — M999 Biomechanical lesion, unspecified: Secondary | ICD-10-CM | POA: Diagnosis not present

## 2012-05-01 ENCOUNTER — Ambulatory Visit
Admission: RE | Admit: 2012-05-01 | Discharge: 2012-05-01 | Disposition: A | Discharge status: Home / Self Care | Payer: Medicare Other | Source: Ambulatory Visit | Attending: Obstetrics & Gynecology | Admitting: Obstetrics & Gynecology

## 2012-05-01 ENCOUNTER — Ambulatory Visit
Admission: RE | Admit: 2012-05-01 | Discharge: 2012-05-01 | Disposition: A | Discharge status: Home / Self Care | Payer: Medicare Other | Source: Ambulatory Visit | Attending: Family Medicine | Admitting: Family Medicine

## 2012-05-01 DIAGNOSIS — Z780 Asymptomatic menopausal state: Secondary | ICD-10-CM

## 2012-05-01 DIAGNOSIS — R928 Other abnormal and inconclusive findings on diagnostic imaging of breast: Secondary | ICD-10-CM

## 2012-05-01 DIAGNOSIS — M949 Disorder of cartilage, unspecified: Secondary | ICD-10-CM | POA: Diagnosis not present

## 2012-05-01 DIAGNOSIS — M899 Disorder of bone, unspecified: Secondary | ICD-10-CM | POA: Diagnosis not present

## 2012-05-05 ENCOUNTER — Other Ambulatory Visit: Payer: Self-pay | Admitting: Internal Medicine

## 2012-05-05 DIAGNOSIS — F331 Major depressive disorder, recurrent, moderate: Secondary | ICD-10-CM | POA: Diagnosis not present

## 2012-05-05 DIAGNOSIS — E049 Nontoxic goiter, unspecified: Secondary | ICD-10-CM

## 2012-05-12 ENCOUNTER — Telehealth: Payer: Self-pay

## 2012-05-12 NOTE — Telephone Encounter (Signed)
Patient is returning our phone call regarding her back xray and her bone density scan.   Best#: [id number]

## 2012-05-13 NOTE — Telephone Encounter (Signed)
D/W pt results of bone density and last xrays ordered by Dr L. Pt agreed w/instr's - see under result notes.

## 2012-05-19 ENCOUNTER — Other Ambulatory Visit: Payer: Medicare Other

## 2012-05-23 ENCOUNTER — Ambulatory Visit
Admission: RE | Admit: 2012-05-23 | Discharge: 2012-05-23 | Disposition: A | Discharge status: Home / Self Care | Payer: Medicare Other | Source: Ambulatory Visit | Attending: Internal Medicine | Admitting: Internal Medicine

## 2012-05-23 DIAGNOSIS — E042 Nontoxic multinodular goiter: Secondary | ICD-10-CM | POA: Diagnosis not present

## 2012-05-23 DIAGNOSIS — M999 Biomechanical lesion, unspecified: Secondary | ICD-10-CM | POA: Diagnosis not present

## 2012-05-23 DIAGNOSIS — E049 Nontoxic goiter, unspecified: Secondary | ICD-10-CM

## 2012-05-26 DIAGNOSIS — F331 Major depressive disorder, recurrent, moderate: Secondary | ICD-10-CM | POA: Diagnosis not present

## 2012-05-28 ENCOUNTER — Other Ambulatory Visit: Payer: Self-pay | Admitting: Internal Medicine

## 2012-05-28 DIAGNOSIS — Z8632 Personal history of gestational diabetes: Secondary | ICD-10-CM | POA: Diagnosis not present

## 2012-05-28 DIAGNOSIS — Z8349 Family history of other endocrine, nutritional and metabolic diseases: Secondary | ICD-10-CM | POA: Diagnosis not present

## 2012-05-28 DIAGNOSIS — R1310 Dysphagia, unspecified: Secondary | ICD-10-CM | POA: Diagnosis not present

## 2012-05-28 DIAGNOSIS — E042 Nontoxic multinodular goiter: Secondary | ICD-10-CM | POA: Diagnosis not present

## 2012-05-28 DIAGNOSIS — E785 Hyperlipidemia, unspecified: Secondary | ICD-10-CM | POA: Diagnosis not present

## 2012-05-28 DIAGNOSIS — M999 Biomechanical lesion, unspecified: Secondary | ICD-10-CM | POA: Diagnosis not present

## 2012-05-28 DIAGNOSIS — E041 Nontoxic single thyroid nodule: Secondary | ICD-10-CM

## 2012-05-28 DIAGNOSIS — Z833 Family history of diabetes mellitus: Secondary | ICD-10-CM | POA: Diagnosis not present

## 2012-05-28 DIAGNOSIS — R946 Abnormal results of thyroid function studies: Secondary | ICD-10-CM | POA: Diagnosis not present

## 2012-06-03 DIAGNOSIS — F331 Major depressive disorder, recurrent, moderate: Secondary | ICD-10-CM | POA: Diagnosis not present

## 2012-06-04 DIAGNOSIS — Z9189 Other specified personal risk factors, not elsewhere classified: Secondary | ICD-10-CM | POA: Diagnosis not present

## 2012-06-04 DIAGNOSIS — B977 Papillomavirus as the cause of diseases classified elsewhere: Secondary | ICD-10-CM | POA: Diagnosis not present

## 2012-06-04 DIAGNOSIS — Z1151 Encounter for screening for human papillomavirus (HPV): Secondary | ICD-10-CM | POA: Diagnosis not present

## 2012-06-04 DIAGNOSIS — Z124 Encounter for screening for malignant neoplasm of cervix: Secondary | ICD-10-CM | POA: Diagnosis not present

## 2012-06-09 DIAGNOSIS — F331 Major depressive disorder, recurrent, moderate: Secondary | ICD-10-CM | POA: Diagnosis not present

## 2012-06-11 ENCOUNTER — Other Ambulatory Visit (HOSPITAL_COMMUNITY)
Admission: RE | Admit: 2012-06-11 | Discharge: 2012-06-11 | Disposition: A | Discharge status: Home / Self Care | Payer: Medicare Other | Source: Ambulatory Visit | Attending: Diagnostic Radiology | Admitting: Diagnostic Radiology

## 2012-06-11 ENCOUNTER — Ambulatory Visit
Admission: RE | Admit: 2012-06-11 | Discharge: 2012-06-11 | Disposition: A | Discharge status: Home / Self Care | Payer: Medicare Other | Source: Ambulatory Visit | Attending: Internal Medicine | Admitting: Internal Medicine

## 2012-06-11 DIAGNOSIS — E049 Nontoxic goiter, unspecified: Secondary | ICD-10-CM | POA: Insufficient documentation

## 2012-06-11 DIAGNOSIS — E041 Nontoxic single thyroid nodule: Secondary | ICD-10-CM | POA: Diagnosis not present

## 2012-06-12 DIAGNOSIS — F331 Major depressive disorder, recurrent, moderate: Secondary | ICD-10-CM | POA: Diagnosis not present

## 2012-06-13 DIAGNOSIS — M999 Biomechanical lesion, unspecified: Secondary | ICD-10-CM | POA: Diagnosis not present

## 2012-06-17 DIAGNOSIS — F331 Major depressive disorder, recurrent, moderate: Secondary | ICD-10-CM | POA: Diagnosis not present

## 2012-06-19 DIAGNOSIS — F05 Delirium due to known physiological condition: Secondary | ICD-10-CM | POA: Diagnosis not present

## 2012-06-19 DIAGNOSIS — F0390 Unspecified dementia without behavioral disturbance: Secondary | ICD-10-CM | POA: Diagnosis not present

## 2012-06-23 DIAGNOSIS — F331 Major depressive disorder, recurrent, moderate: Secondary | ICD-10-CM | POA: Diagnosis not present

## 2012-07-07 DIAGNOSIS — F331 Major depressive disorder, recurrent, moderate: Secondary | ICD-10-CM | POA: Diagnosis not present

## 2012-07-09 ENCOUNTER — Telehealth: Payer: Self-pay

## 2012-07-09 DIAGNOSIS — I10 Essential (primary) hypertension: Secondary | ICD-10-CM

## 2012-07-09 DIAGNOSIS — M999 Biomechanical lesion, unspecified: Secondary | ICD-10-CM | POA: Diagnosis not present

## 2012-07-09 MED ORDER — LOSARTAN POTASSIUM-HCTZ 50-12.5 MG PO TABS: 1.0000 | ORAL_TABLET | Freq: Every day | ORAL | Status: DC

## 2012-07-09 NOTE — Telephone Encounter (Signed)
Called patient to advise [facility]

## 2012-07-09 NOTE — Telephone Encounter (Signed)
Patient needs 30-day supply of losartin hctz 50-12.5 until she gets her mail-order medications from her new mail order company.  [facility] [address]

## 2012-07-10 DIAGNOSIS — F331 Major depressive disorder, recurrent, moderate: Secondary | ICD-10-CM | POA: Diagnosis not present

## 2012-07-14 DIAGNOSIS — F331 Major depressive disorder, recurrent, moderate: Secondary | ICD-10-CM | POA: Diagnosis not present

## 2012-07-16 DIAGNOSIS — M999 Biomechanical lesion, unspecified: Secondary | ICD-10-CM | POA: Diagnosis not present

## 2012-07-19 ENCOUNTER — Ambulatory Visit (INDEPENDENT_AMBULATORY_CARE_PROVIDER_SITE_OTHER): Payer: Medicare Other | Admitting: Family Medicine

## 2012-07-19 ENCOUNTER — Ambulatory Visit: Payer: Medicare Other

## 2012-07-19 VITALS — BP 107/73 | HR 88 | Temp 98.2°F | Resp 16 | Ht 62.75 in | Wt 188.4 lb

## 2012-07-19 DIAGNOSIS — M549 Dorsalgia, unspecified: Secondary | ICD-10-CM

## 2012-07-19 DIAGNOSIS — E785 Hyperlipidemia, unspecified: Secondary | ICD-10-CM | POA: Diagnosis not present

## 2012-07-19 DIAGNOSIS — M79609 Pain in unspecified limb: Secondary | ICD-10-CM

## 2012-07-19 DIAGNOSIS — M542 Cervicalgia: Secondary | ICD-10-CM

## 2012-07-19 DIAGNOSIS — M79601 Pain in right arm: Secondary | ICD-10-CM

## 2012-07-19 LAB — POCT URINALYSIS DIPSTICK
Bilirubin, UA: NEGATIVE
Blood, UA: NEGATIVE
Glucose, UA: NEGATIVE
Leukocytes, UA: NEGATIVE
Nitrite, UA: NEGATIVE
Spec Grav, UA: 1.025
Urobilinogen, UA: 0.2
pH, UA: 6

## 2012-07-19 LAB — POCT CBC
Granulocyte percent: 58 %G (ref 37–80)
HCT, POC: 44.9 % (ref 37.7–47.9)
Hemoglobin: 14.3 g/dL (ref 12.2–16.2)
Lymph, poc: 3.4 (ref 0.6–3.4)
MCH, POC: 27.9 pg (ref 27–31.2)
MCHC: 31.8 g/dL (ref 31.8–35.4)
MCV: 87.6 fL (ref 80–97)
MID (cbc): 0.4 (ref 0–0.9)
MPV: 7.9 fL (ref 0–99.8)
POC Granulocyte: 5.3 (ref 2–6.9)
POC LYMPH PERCENT: 37.6 %L (ref 10–50)
POC MID %: 4.4 %M (ref 0–12)
Platelet Count, POC: 353 10*3/uL (ref 142–424)
RBC: 5.12 M/uL (ref 4.04–5.48)
RDW, POC: 14.7 %
WBC: 9.1 10*3/uL (ref 4.6–10.2)

## 2012-07-19 LAB — LIPID PANEL
Cholesterol: 222 mg/dL — ABNORMAL HIGH (ref 0–200)
HDL: 48 mg/dL (ref >39)
LDL Cholesterol: 110 mg/dL — ABNORMAL HIGH (ref 0–99)
Total CHOL/HDL Ratio: 4.6 Ratio
Triglycerides: 318 mg/dL — ABNORMAL HIGH (ref <150)
VLDL: 64 mg/dL — ABNORMAL HIGH (ref 0–40)

## 2012-07-19 LAB — COMPREHENSIVE METABOLIC PANEL
ALT: 13 U/L (ref 0–35)
AST: 14 U/L (ref 0–37)
Albumin: 4.3 g/dL (ref 3.5–5.2)
Alkaline Phosphatase: 91 U/L (ref 39–117)
BUN: 16 mg/dL (ref 6–23)
CO2: 27 mEq/L (ref 19–32)
Calcium: 9.5 mg/dL (ref 8.4–10.5)
Chloride: 99 mEq/L (ref 96–112)
Creat: 0.93 mg/dL (ref 0.50–1.10)
Glucose, Bld: 87 mg/dL (ref 70–99)
Potassium: 4.3 mEq/L (ref 3.5–5.3)
Sodium: 137 mEq/L (ref 135–145)
Total Bilirubin: 0.7 mg/dL (ref 0.3–1.2)
Total Protein: 7 g/dL (ref 6.0–8.3)

## 2012-07-19 LAB — POCT UA - MICROSCOPIC ONLY
Casts, Ur, LPF, POC: NEGATIVE
Crystals, Ur, HPF, POC: NEGATIVE
Mucus, UA: NEGATIVE
RBC, urine, microscopic: NEGATIVE
Yeast, UA: NEGATIVE

## 2012-07-19 LAB — POCT SEDIMENTATION RATE: POCT SED RATE: 10 mm/hr (ref 0–22)

## 2012-07-19 MED ORDER — DICLOFENAC SODIUM 1 % TD GEL: 2.0000 g | Freq: Four times a day (QID) | TRANSDERMAL | Status: DC

## 2012-07-19 NOTE — Progress Notes (Signed)
52 yo woman with right arm pain from elbow with nodule over ulna, painful since last fall.  SHe has pain with movement of right thumb and wrist as well.  She initially fell carrying vegetables and struck elbow, shoulder and face last October.  She has been seeing a [facility].  The pain in the right arm has persisted She feels the pain is getting worse and she has some weakness  Objective:  NAD Appearance of right arm is normal, palpation starts pain worsening down arm. FROM [facility] reading (PRIMARY) by  Dr. Milus Glazier:  Negative forearm and elbow.  Assessment:  Chronic right pain, unexplained by x-ray.  Plan:      Also, having dull pain left flank and low back.  No urinary symptoms.  Onset:  2 weeks.  Results for orders placed in visit on 07/19/12  POCT UA - MICROSCOPIC ONLY      Component Value Range   WBC, Ur, HPF, POC 0-3     RBC, urine, microscopic neg     Bacteria, U Microscopic 1+     Mucus, UA neg     Epithelial cells, urine per micros 1-8     Crystals, Ur, HPF, POC neg     Casts, Ur, LPF, POC neg     Yeast, UA neg    POCT URINALYSIS DIPSTICK      Component Value Range   Color, UA yellow     Clarity, UA clear     Glucose, UA neg     Bilirubin, UA neg     Ketones, UA trace     Spec Grav, UA 1.025     Blood, UA neg     pH, UA 6.0     Protein, UA trace     Urobilinogen, UA 0.2     Nitrite, UA neg     Leukocytes, UA Negative    POCT CBC      Component Value Range   WBC 9.1  4.6 - 10.2 K/uL   Lymph, poc 3.4  0.6 - 3.4   POC LYMPH PERCENT 37.6  10 - 50 %L   MID (cbc) 0.4  0 - 0.9   POC MID % 4.4  0 - 12 %M   POC Granulocyte 5.3  2 - 6.9   Granulocyte percent 58.0  37 - 80 %G   RBC 5.12  4.04 - 5.48 M/uL   Hemoglobin 14.3  12.2 - 16.2 g/dL   HCT, POC 09.8  11.9 - 47.9 %   MCV 87.6  80 - 97 fL   MCH, POC 27.9  27 - 31.2 pg   MCHC 31.8  31.8 - 35.4 g/dL   RDW, POC 14.7     Platelet Count, POC 353  142 - 424 K/uL   MPV 7.9  0 - 99.8 fL     Also, patient is  having some supraclavicular pain after her thyroid biopsy and is concerned about comments about lymph nodes.  She did not experience pain during the biopsy.  ULTRASOUND GUIDED NEEDLE ASPIRATE BIOPSY OF THE THYROID GLAND  Comparison: Ultrasound 05/23/2012  Thyroid biopsy was thoroughly discussed with the patient and  questions were answered. The benefits, risks, alternatives, and  complications were also discussed. The patient understands and  wishes to proceed with the procedure. Written consent was  obtained.  Ultrasound was performed to localize and mark an adequate site for  the biopsy. The patient was then prepped and draped in a normal  sterile fashion. Local  anesthesia was provided with 1% lidocaine.  Using direct ultrasound guidance, a 25 gauge needle was directed  into the dominant cyst and a small amount of fluid was aspirated.  Using ultrasound guidance, an 18 gauge needle was directed into the  cyst and the entire cyst was aspirated. Approximately 1.5 ml of  brown fluid was removed. Ultrasound was used to confirm needle  placements on all occasions. Specimens were sent to Pathology for  analysis.  Complications: None  Findings: There is a simple appearing cyst within the right  thyroid lobe. 1.5 ml of brown fluid was aspirated. There are small  calcifications along the posterior wall of the cyst. Again noted  are small bilateral neck lymph nodes. The largest is in the right  jugular area. This lymph node roughly measures 0.8 cm in the short  axis and may represent more than one node.  IMPRESSION:  Ultrasound guided needle aspiration of the right thyroid cyst.  Again noted are small lymph nodes in the neck. Based on the  history of breast disease, recommend close attention to these areas  on followup imaging and physical examination.  Original Report Authenticated By: Richarda Overlie, M.D.

## 2012-07-20 ENCOUNTER — Telehealth: Payer: Self-pay | Admitting: Radiology

## 2012-07-20 DIAGNOSIS — I10 Essential (primary) hypertension: Secondary | ICD-10-CM

## 2012-07-20 DIAGNOSIS — E039 Hypothyroidism, unspecified: Secondary | ICD-10-CM

## 2012-07-20 MED ORDER — LOSARTAN POTASSIUM-HCTZ 50-12.5 MG PO TABS: 1.0000 | ORAL_TABLET | Freq: Every day | ORAL | Status: DC

## 2012-07-20 MED ORDER — LEVOTHYROXINE SODIUM 25 MCG PO TABS: 25.0000 ug | ORAL_TABLET | Freq: Every day | ORAL | Status: DC

## 2012-07-20 NOTE — Telephone Encounter (Signed)
I have gotten request from [facility] order for Doxycycline for this patient, but does not look like you were the original prescriber, is it okay to send this in?

## 2012-07-21 ENCOUNTER — Telehealth: Payer: Self-pay

## 2012-07-21 ENCOUNTER — Other Ambulatory Visit: Payer: Self-pay | Admitting: Family Medicine

## 2012-07-21 DIAGNOSIS — F331 Major depressive disorder, recurrent, moderate: Secondary | ICD-10-CM | POA: Diagnosis not present

## 2012-07-21 MED ORDER — DOXYCYCLINE MONOHYDRATE 100 MG PO CAPS: 100.0000 mg | ORAL_CAPSULE | Freq: Two times a day (BID) | ORAL | Status: DC

## 2012-07-21 NOTE — Telephone Encounter (Signed)
Patient takes for cystic acne, takes bid. Would like for you to take this over, she states you are listed as her PCP

## 2012-07-21 NOTE — Telephone Encounter (Signed)
[place]:   I spoke with patient regarding making her an appointment with you, but your next available appt for a medicare physical is not until 10/09/12.  The slot is for 45 mins and patient stated to leave you a message that she couldn't get in any earlier and I explained to her that you do not have any opening until then for an physical.    Best#: [phone]

## 2012-07-21 NOTE — Telephone Encounter (Signed)
I don't know what this is for.  Can you ask patient what her current situation is.  When she last presented, she had so many issues and there was much confusion in the clinic.  She was worried she had broken her arm.  So I don't really recall a reason for doxycycline.

## 2012-07-24 DIAGNOSIS — F331 Major depressive disorder, recurrent, moderate: Secondary | ICD-10-CM | POA: Diagnosis not present

## 2012-07-28 DIAGNOSIS — F331 Major depressive disorder, recurrent, moderate: Secondary | ICD-10-CM | POA: Diagnosis not present

## 2012-07-29 ENCOUNTER — Telehealth: Payer: Self-pay

## 2012-07-29 DIAGNOSIS — I10 Essential (primary) hypertension: Secondary | ICD-10-CM

## 2012-07-29 DIAGNOSIS — E039 Hypothyroidism, unspecified: Secondary | ICD-10-CM

## 2012-07-29 MED ORDER — LEVOTHYROXINE SODIUM 25 MCG PO TABS: 25.0000 ug | ORAL_TABLET | Freq: Every day | ORAL | Status: DC

## 2012-07-29 MED ORDER — DOXYCYCLINE MONOHYDRATE 100 MG PO CAPS: 100.0000 mg | ORAL_CAPSULE | Freq: Two times a day (BID) | ORAL | Status: DC

## 2012-07-29 NOTE — Telephone Encounter (Signed)
I am opening up clinic hours in March.  Just give me 48 hours to get these in EPIC.

## 2012-07-29 NOTE — Telephone Encounter (Signed)
Patient is having trouble getting rx refilled through [facility], her pharmacy asked her to call it in using this number [phone].they are not receiving the faxes?  Needs these other refilled: dexcycline and levothyroxin. She says her pe is scheduled for April. She just needs her bp rx. Patient wants to speak to someone about this, thank you!

## 2012-07-29 NOTE — Telephone Encounter (Signed)
They were sent to [facility] order, she states they did not get the thyroid meds or doxycycline, these are resubmitted. Patient states she can not get in to see you until April, she wants to know if there is anything you can do to get her in sooner? Please advise.

## 2012-07-30 ENCOUNTER — Telehealth: Payer: Self-pay

## 2012-07-30 DIAGNOSIS — M999 Biomechanical lesion, unspecified: Secondary | ICD-10-CM | POA: Diagnosis not present

## 2012-07-30 NOTE — Telephone Encounter (Signed)
Notified pt that Rxs were resent and that Dr L is opening some clinic hours in March. Transferred her to 104 for appt.

## 2012-07-30 NOTE — Telephone Encounter (Signed)
The doxycycline qty they called in was incorrect supposed to be 180 instead 60. She wants to make sure you put this qty on file. Best#  [phone]

## 2012-07-30 NOTE — Telephone Encounter (Signed)
Patient has upcoming appt, can discuss with Dr Milus Glazier at visit. Tamara [name]

## 2012-07-31 DIAGNOSIS — F331 Major depressive disorder, recurrent, moderate: Secondary | ICD-10-CM | POA: Diagnosis not present

## 2012-07-31 NOTE — Telephone Encounter (Signed)
Notified pt we would call when March openings were completed. Tamara [name]

## 2012-08-04 DIAGNOSIS — F331 Major depressive disorder, recurrent, moderate: Secondary | ICD-10-CM | POA: Diagnosis not present

## 2012-08-06 DIAGNOSIS — M999 Biomechanical lesion, unspecified: Secondary | ICD-10-CM | POA: Diagnosis not present

## 2012-08-07 DIAGNOSIS — F331 Major depressive disorder, recurrent, moderate: Secondary | ICD-10-CM | POA: Diagnosis not present

## 2012-08-28 DIAGNOSIS — F331 Major depressive disorder, recurrent, moderate: Secondary | ICD-10-CM | POA: Diagnosis not present

## 2012-08-29 DIAGNOSIS — G44209 Tension-type headache, unspecified, not intractable: Secondary | ICD-10-CM | POA: Diagnosis not present

## 2012-08-29 DIAGNOSIS — H2510 Age-related nuclear cataract, unspecified eye: Secondary | ICD-10-CM | POA: Diagnosis not present

## 2012-08-31 DIAGNOSIS — F331 Major depressive disorder, recurrent, moderate: Secondary | ICD-10-CM | POA: Diagnosis not present

## 2012-09-03 DIAGNOSIS — M999 Biomechanical lesion, unspecified: Secondary | ICD-10-CM | POA: Diagnosis not present

## 2012-09-04 DIAGNOSIS — F331 Major depressive disorder, recurrent, moderate: Secondary | ICD-10-CM | POA: Diagnosis not present

## 2012-09-09 DIAGNOSIS — F331 Major depressive disorder, recurrent, moderate: Secondary | ICD-10-CM | POA: Diagnosis not present

## 2012-09-10 ENCOUNTER — Ambulatory Visit (INDEPENDENT_AMBULATORY_CARE_PROVIDER_SITE_OTHER): Payer: Medicare Other | Admitting: Family Medicine

## 2012-09-10 ENCOUNTER — Encounter: Payer: Self-pay | Admitting: Family Medicine

## 2012-09-10 VITALS — BP 102/80 | HR 92 | Temp 98.9°F | Resp 16 | Ht 62.0 in | Wt 186.0 lb

## 2012-09-10 DIAGNOSIS — Z23 Encounter for immunization: Secondary | ICD-10-CM | POA: Diagnosis not present

## 2012-09-10 DIAGNOSIS — E785 Hyperlipidemia, unspecified: Secondary | ICD-10-CM

## 2012-09-10 DIAGNOSIS — F32A Depression, unspecified: Secondary | ICD-10-CM

## 2012-09-10 DIAGNOSIS — I10 Essential (primary) hypertension: Secondary | ICD-10-CM | POA: Diagnosis not present

## 2012-09-10 DIAGNOSIS — R21 Rash and other nonspecific skin eruption: Secondary | ICD-10-CM

## 2012-09-10 DIAGNOSIS — F329 Major depressive disorder, single episode, unspecified: Secondary | ICD-10-CM

## 2012-09-10 DIAGNOSIS — Z0000 Encounter for general adult medical examination without abnormal findings: Secondary | ICD-10-CM | POA: Diagnosis not present

## 2012-09-10 LAB — POCT URINALYSIS DIPSTICK
Bilirubin, UA: NEGATIVE
Blood, UA: NEGATIVE
Glucose, UA: NEGATIVE
Ketones, UA: NEGATIVE
Leukocytes, UA: NEGATIVE
Nitrite, UA: NEGATIVE
Protein, UA: NEGATIVE
Spec Grav, UA: 1.02
Urobilinogen, UA: 0.2
pH, UA: 7

## 2012-09-10 LAB — CBC
HCT: 40.8 % (ref 36.0–46.0)
Hemoglobin: 14.3 g/dL (ref 12.0–15.0)
MCH: 28.5 pg (ref 26.0–34.0)
MCHC: 35 g/dL (ref 30.0–36.0)
MCV: 81.3 fL (ref 78.0–100.0)
Platelets: 304 10*3/uL (ref 150–400)
RBC: 5.02 MIL/uL (ref 3.87–5.11)
RDW: 14.4 % (ref 11.5–15.5)
WBC: 7.3 10*3/uL (ref 4.0–10.5)

## 2012-09-10 LAB — POCT UA - MICROSCOPIC ONLY
Bacteria, U Microscopic: NEGATIVE
Casts, Ur, LPF, POC: NEGATIVE
Crystals, Ur, HPF, POC: NEGATIVE
Mucus, UA: NEGATIVE
RBC, urine, microscopic: NEGATIVE
Yeast, UA: NEGATIVE

## 2012-09-10 LAB — TSH: TSH: 0.461 u[IU]/mL (ref 0.350–4.500)

## 2012-09-10 MED ORDER — DOXYCYCLINE MONOHYDRATE 100 MG PO CAPS: 100.0000 mg | ORAL_CAPSULE | Freq: Two times a day (BID) | ORAL | Status: DC

## 2012-09-10 MED ORDER — TETANUS-DIPHTH-ACELL PERTUSSIS 5-2.5-18.5 LF-MCG/0.5 IM SUSP
0.5000 mL | Freq: Once | INTRAMUSCULAR | Status: AC
  Administered 2012-09-10: 0.5 mL via INTRAMUSCULAR

## 2012-09-10 MED ORDER — LOSARTAN POTASSIUM-HCTZ 50-12.5 MG PO TABS: 1.0000 | ORAL_TABLET | Freq: Every day | ORAL | Status: DC

## 2012-09-10 MED ORDER — LEVOTHYROXINE SODIUM 25 MCG PO TABS: 25.0000 ug | ORAL_TABLET | Freq: Every day | ORAL | Status: DC

## 2012-09-10 NOTE — Progress Notes (Signed)
Patient ID: Tamara [name] MRN: [id number], DOB: 1961/02/21, 52 y.o. Date of Encounter: 09/10/2012, 2:25 PM  Primary Physician: Elvina Sidle, MD  Chief Complaint: Physical (CPE)  HPI: 52 y.o. y/o female with history of noted below here for CPE.  Doing well. No new issues/complaints.  Chronic right elbow, thumb and low back and left hip pains since fall last year.  Dr. Sharl Ma has been following thyroid Dr. Donell Beers has been following affective disorder.  MMG:  Abnormal area left breast with question of lymphadenopathy (January) Review of Systems: Consitutional: No fever, chills, fatigue, night sweats, lymphadenopathy, or weight changes. Eyes: No visual changes, eye redness, or discharge. ENT/Mouth: Ears: No otalgia, tinnitus, hearing loss, discharge. Nose: No congestion, rhinorrhea, sinus pain, or epistaxis. Throat: No sore throat, post nasal drip, or teeth pain. Cardiovascular: No CP, palpitations, diaphoresis, DOE, edema, orthopnea, PND. Respiratory: No cough, hemoptysis, SOB, or wheezing. Gastrointestinal: No anorexia, dysphagia, reflux, pain, nausea, vomiting, hematemesis, constipation, BRBPR, or melena.  Chronic diarrhea (lifelong, may be related to gluten intake and is now on gluten free diet) Breast: No discharge, pain, swelling, or mass. Genitourinary: No dysuria, frequency, urgency, hematuria, incontinence, nocturia, amenorrhea, vaginal discharge, pruritis, burning, abnormal bleeding, or pain. Musculoskeletal: No decreased ROM, myalgias, stiffness, joint swelling, or weakness except as above Skin: No rash, erythema, lesion changes, pain, warmth, jaundice, or pruritis. Neurological: No headache, dizziness, syncope, seizures, tremors, memory loss, coordination problems, or paresthesias. Psychological: No anxiety, depression, hallucinations, SI/HI. Endocrine: No fatigue, polydipsia, polyphagia, polyuria, or known diabetes. All other systems were reviewed and are otherwise  negative.  Past Medical History  Diagnosis Date  . Depression   . HTN (hypertension)   . Hyperlipemia   . Allergy   . GERD (gastroesophageal reflux disease)   . Neuromuscular disorder   . Anemia   . Anxiety      Past Surgical History  Procedure Laterality Date  . Cholecystectomy    . Abdominal hysterectomy      Home Meds:  Prior to Admission medications   Medication Sig Start Date End Date Taking? Authorizing Provider  ALPRAZolam Prudy Feeler) 1 MG tablet Take 1 mg by mouth continuous as needed.   Yes Historical Provider, MD  ALPRAZOLAM XR 1 MG 24 hr tablet Take 3 mg by mouth at bedtime.  08/10/11  Yes Historical Provider, MD  dexlansoprazole (DEXILANT) 60 MG capsule Take 60 mg by mouth daily.   Yes Historical Provider, MD  diclofenac sodium (VOLTAREN) 1 % GEL Apply 2 g topically 4 (four) times daily. 07/19/12  Yes Elvina Sidle, MD  doxycycline (MONODOX) 100 MG capsule Take 1 capsule (100 mg total) by mouth 2 (two) times daily. 07/29/12  Yes Elvina Sidle, MD  levothyroxine (LEVOTHROID) 25 MCG tablet Take 1 tablet (25 mcg total) by mouth daily. 07/29/12 07/29/13 Yes Elvina Sidle, MD  losartan-hydrochlorothiazide (HYZAAR) 50-12.5 MG per tablet Take 1 tablet by mouth daily. 07/20/12  Yes Ryan M Dunn, PA-C  NON FORMULARY Take 4 mg by mouth as needed. Trilofan 4 mg   Yes Historical Provider, MD  Oxcarbazepine (TRILEPTAL) 300 MG tablet Take 300 mg by mouth 2 (two) times daily.   Yes Historical Provider, MD  temazepam (RESTORIL) 15 MG capsule Take 45 mg by mouth at bedtime as needed.  08/10/11  Yes Historical Provider, MD  Vilazodone HCl (VIIBRYD) 20 MG TABS Take by mouth daily.   Yes Historical Provider, MD  albuterol (PROVENTIL HFA;VENTOLIN HFA) 108 (90 BASE) MCG/ACT inhaler Inhale 1 puff into the lungs as needed.  10/06/11   Rickard Patience, PA-C  dexlansoprazole (DEXILANT) 60 MG capsule Take 1 capsule (60 mg total) by mouth daily. 08/18/11 09/17/11  Ryan M Dunn, PA-C  niacin (NIASPAN) 500 MG CR  tablet Take 1 tablet (500 mg total) by mouth at bedtime. 11/28/11 11/27/12  Rollene Rotunda, MD    Allergies:  Allergies  Allergen Reactions  . Gabapentin     Unknown reaction  . Ivp Dye (Iodinated Diagnostic Agents)   . Propoxyphene Hcl Other (See Comments)    hallucination  . Seroquel (Quetiapine Fumerate)     Unknown reaction  . Penicillins Rash  . Sulfonamide Derivatives Rash    History   Social History  . Marital Status: Divorced    Spouse Name: N/A    Number of Children: N/A  . Years of Education: N/A   Occupational History  . Not on file.   Social History Main Topics  . Smoking status: Never Smoker   . Smokeless tobacco: Not on file  . Alcohol Use: No  . Drug Use: No  . Sexually Active: Yes   Other Topics Concern  . Not on file   Social History Narrative   Lives with boyfriend.    Family History  Problem Relation Age of Onset  . Arrhythmia Father   . Stroke Father   . Hyperlipidemia Father   . Coronary artery disease Maternal Grandfather 40    Died 48  . Cancer Mother   . Hyperlipidemia Mother   . Hearing loss Paternal Grandmother   . Hearing loss Paternal Grandfather     Physical Exam: Pulse 92, temperature 98.9 F (37.2 C), temperature source Oral, resp. rate 16, height 5\' 2"  (1.575 m), weight 186 lb (84.369 kg), SpO2 98.00%., Body mass index is 34.01 kg/(m^2). General: Well developed, well nourished, in no acute distress. HEENT: Normocephalic, atraumatic. Conjunctiva pink, sclera non-icteric. Pupils 2 mm constricting to 1 mm, round, regular, and equally reactive to light and accomodation. EOMI. Internal auditory canal clear. TMs with good cone of light and without pathology. Nasal mucosa pink. Nares are without discharge. No sinus tenderness. Oral mucosa pink. Dentition no acute problems. Pharynx without exudate.   Neck: Supple. Trachea midline. No thyromegaly. Full ROM. No lymphadenopathy. Lungs: Clear to auscultation bilaterally without wheezes,  rales, or rhonchi. Breathing is of normal effort and unlabored. Cardiovascular: RRR with S1 S2. No murmurs, rubs, or gallops appreciated. Distal pulses 2+ symmetrically. No carotid or abdominal bruits. Breast: Symmetrical. No masses. Nipples without discharge. Abdomen: Soft, non-tender, non-distended with normoactive bowel sounds. No hepatosplenomegaly or masses. No rebound/guarding. No CVA tenderness. Without hernias.  Genitourinary: Dr. Juliene Pina in December Musculoskeletal: Full range of motion and 5/5 strength throughout. Without swelling, atrophy, tenderness, crepitus, or warmth. Extremities without clubbing, cyanosis, or edema. Calves supple. Skin: Warm and moist without erythema, ecchymosis, wounds, or rash. Neuro: A+Ox3. CN II-XII grossly intact. Moves all extremities spontaneously. Full sensation throughout. Normal gait. DTR 2+ throughout upper and lower extremities. Finger to nose intact. Psych:  Responds to questions appropriately with a normal affect.   Studies: CBC, CMET, Lipid, TSH, Vitamin D all pending. Patient is depressed according to [employer]. UA:   Clinical Data: Thyroid goiter, follow-up November 2013 THYROID ULTRASOUND  Technique: Ultrasound examination of the thyroid gland and adjacent  soft tissues was performed.  Comparison: Ultrasound of the thyroid of 10/10/2011  Findings:  Right thyroid lobe: 6.9 x 1.8 x 2.2 cm. (Previously 6.2 x 2.2 x  2.2 cm).  Left thyroid lobe:  7.2 x 1.7 x 1.7 cm. (Previously 5.7 x 1.9 x  2.2 cm).  Isthmus: 5 mm in thickness.  Focal nodules: The echogenicity of the thyroid parenchyma is  relatively homogeneous. There are multiple nodules bilaterally,  many of which are cystic. The largest cystic nodule is in the  lower pole of the right lobe measuring 1.7 x 1.8 x 1.1 cm. The  largest solid nodule is in the lower pole of the left lobe  measuring 1.0 x 0.6 x 0.8 cm. Previously this nodule measured 0.9  x 0.7 x 0.9 cm. Smaller nodules  are present bilaterally of no more  than 6 mm in diameter.  Lymphadenopathy: None visualized.  IMPRESSION:  Stable thyroid nodules bilaterally. The largest solid nodule  measures 10 mm in maximum diameter in the lower pole of the left  lobe and appears stable.  ULTRASOUND GUIDED NEEDLE ASPIRATE BIOPSY OF THE THYROID GLAND  Comparison: Ultrasound 05/23/2012  Thyroid biopsy was thoroughly discussed with the patient and  questions were answered. The benefits, risks, alternatives, and  complications were also discussed. The patient understands and  wishes to proceed with the procedure. Written consent was  obtained.  Ultrasound was performed to localize and mark an adequate site for  the biopsy. The patient was then prepped and draped in a normal  sterile fashion. Local anesthesia was provided with 1% lidocaine.  Using direct ultrasound guidance, a 25 gauge needle was directed  into the dominant cyst and a small amount of fluid was aspirated.  Using ultrasound guidance, an 18 gauge needle was directed into the  cyst and the entire cyst was aspirated. Approximately 1.5 ml of  brown fluid was removed. Ultrasound was used to confirm needle  placements on all occasions. Specimens were sent to Pathology for  analysis.  Complications: None  Findings: There is a simple appearing cyst within the right  thyroid lobe. 1.5 ml of brown fluid was aspirated. There are small  calcifications along the posterior wall of the cyst. Again noted  are small bilateral neck lymph nodes. The largest is in the right  jugular area. This lymph node roughly measures 0.8 cm in the short  axis and may represent more than one node.  IMPRESSION:  Ultrasound guided needle aspiration of the right thyroid cyst.  Again noted are small lymph nodes in the neck. Based on the  history of breast disease, recommend close attention to these areas  on followup imaging and physical examination.  Original Report Authenticated By:  Richarda Overlie, M.D.     Clinical Data: Patient presents for additional views of the left  breast as follow-up to a recent screening exam suggesting a  possible mass.  DIGITAL DIAGNOSTIC LEFT MAMMOGRAM AND LEFT BREAST ULTRASOUND:  Comparison: 04/19/2011, 12/29/2009, 04/20/2008 and 01/07/2007  Findings: Spot compression images demonstrate persistence of a  small nodular density over the central left breast on the CC image  difficult to localize on the compression MLO view although likely  within the upper breast when compared to the tomographic images.  Ultrasound is performed, showing a well defined ovoid hypoechoic  mass with septations at the 12 o'clock position 3 cm from the  nipple measuring 3 x 4 x 6 mm likely benign such as clustered  microcysts. This may correspond to the mammographic abnormality.  Incidentally noted is a small cyst at the 11 o'clock position 5-6  cm from the nipple measuring 2 x 4 x 4 mm.  IMPRESSION:  Probable benign findings at the  12 o'clock position of the left  breast.  RECOMMENDATION:  Recommend 69-month follow-up diagnostic left breast mammogram and  ultrasound to document stability.  I have discussed the findings and recommendations with the patient.  Results were also provided in writing at the conclusion of the  visit.  BI-RADS CATEGORY 3: Probably benign finding(s) - short interval  follow-up suggested.  Original Report Authenticated By: Elberta Fortis, M.D.  Results for orders placed in visit on 09/10/12  POCT URINALYSIS DIPSTICK      Result Value Range   Color, UA yellow     Clarity, UA clear     Glucose, UA neg     Bilirubin, UA neg     Ketones, UA neg     Spec Grav, UA 1.020     Blood, UA neg     pH, UA 7.0     Protein, UA neg     Urobilinogen, UA 0.2     Nitrite, UA neg     Leukocytes, UA Negative    POCT UA - MICROSCOPIC ONLY      Result Value Range   WBC, Ur, HPF, POC 0-2     RBC, urine, microscopic neg     Bacteria, U Microscopic  neg     Mucus, UA neg     Epithelial cells, urine per micros 0-1     Crystals, Ur, HPF, POC neg     Casts, Ur, LPF, POC neg     Yeast, UA neg       Assessment/Plan:  52 y.o. y/o female here for CPE Other and unspecified hyperlipidemia - Plan: CBC, POCT urinalysis dipstick, TSH  Hypertension - Plan: losartan-hydrochlorothiazide (HYZAAR) 50-12.5 MG per tablet  Rash and nonspecific skin eruption - Plan: doxycycline (MONODOX) 100 MG capsule  Depression - Plan: levothyroxine (LEVOTHROID) 25 MCG tablet  Routine general medical examination at a health care facility - Plan: TDaP (BOOSTRIX) injection 0.5 mL, POCT UA - Microscopic Only    Follow up of mammogram abnormality sched for May. -  Signed, Elvina Sidle, MD 09/10/2012 2:25 PM

## 2012-09-10 NOTE — Progress Notes (Deleted)
  Subjective:    Patient ID: Tamara [name], female    DOB: [birth year], 53 y.o.   MRN: [id number]  HPI    Review of Systems     Objective:   Physical Exam        Assessment & Plan:

## 2012-09-17 DIAGNOSIS — F331 Major depressive disorder, recurrent, moderate: Secondary | ICD-10-CM | POA: Diagnosis not present

## 2012-09-18 DIAGNOSIS — F05 Delirium due to known physiological condition: Secondary | ICD-10-CM | POA: Diagnosis not present

## 2012-09-18 DIAGNOSIS — F0390 Unspecified dementia without behavioral disturbance: Secondary | ICD-10-CM | POA: Diagnosis not present

## 2012-09-19 DIAGNOSIS — M999 Biomechanical lesion, unspecified: Secondary | ICD-10-CM | POA: Diagnosis not present

## 2012-09-22 DIAGNOSIS — F331 Major depressive disorder, recurrent, moderate: Secondary | ICD-10-CM | POA: Diagnosis not present

## 2012-09-24 ENCOUNTER — Encounter: Payer: Self-pay | Admitting: Internal Medicine

## 2012-09-25 ENCOUNTER — Other Ambulatory Visit: Payer: Self-pay | Admitting: Family Medicine

## 2012-09-25 DIAGNOSIS — F331 Major depressive disorder, recurrent, moderate: Secondary | ICD-10-CM | POA: Diagnosis not present

## 2012-09-25 NOTE — Telephone Encounter (Signed)
Pt stated she does not want the Rx sent to [facility], she gets it through mail order. I will refuse. Pt also wanted her recent labs mailed to her, and I have done this.

## 2012-09-25 NOTE — Telephone Encounter (Signed)
LMOM to CB. Rx for Levothyroxine was just sent to [facility] mail order on 09/10/12. Does pt want this locally instead?

## 2012-09-26 DIAGNOSIS — M999 Biomechanical lesion, unspecified: Secondary | ICD-10-CM | POA: Diagnosis not present

## 2012-09-26 DIAGNOSIS — S336XXA Sprain of sacroiliac joint, initial encounter: Secondary | ICD-10-CM | POA: Diagnosis not present

## 2012-09-29 DIAGNOSIS — F331 Major depressive disorder, recurrent, moderate: Secondary | ICD-10-CM | POA: Diagnosis not present

## 2012-09-30 DIAGNOSIS — F331 Major depressive disorder, recurrent, moderate: Secondary | ICD-10-CM | POA: Diagnosis not present

## 2012-10-09 ENCOUNTER — Encounter: Payer: Medicare Other | Admitting: Family Medicine

## 2012-10-09 DIAGNOSIS — F331 Major depressive disorder, recurrent, moderate: Secondary | ICD-10-CM | POA: Diagnosis not present

## 2012-10-13 ENCOUNTER — Other Ambulatory Visit: Payer: Self-pay | Admitting: Obstetrics & Gynecology

## 2012-10-13 ENCOUNTER — Encounter: Payer: Self-pay | Admitting: Internal Medicine

## 2012-10-13 DIAGNOSIS — F331 Major depressive disorder, recurrent, moderate: Secondary | ICD-10-CM | POA: Diagnosis not present

## 2012-10-13 DIAGNOSIS — N6320 Unspecified lump in the left breast, unspecified quadrant: Secondary | ICD-10-CM

## 2012-10-15 DIAGNOSIS — M999 Biomechanical lesion, unspecified: Secondary | ICD-10-CM | POA: Diagnosis not present

## 2012-10-15 DIAGNOSIS — S336XXA Sprain of sacroiliac joint, initial encounter: Secondary | ICD-10-CM | POA: Diagnosis not present

## 2012-10-16 DIAGNOSIS — F331 Major depressive disorder, recurrent, moderate: Secondary | ICD-10-CM | POA: Diagnosis not present

## 2012-10-20 DIAGNOSIS — F331 Major depressive disorder, recurrent, moderate: Secondary | ICD-10-CM | POA: Diagnosis not present

## 2012-10-22 DIAGNOSIS — S336XXA Sprain of sacroiliac joint, initial encounter: Secondary | ICD-10-CM | POA: Diagnosis not present

## 2012-10-22 DIAGNOSIS — M999 Biomechanical lesion, unspecified: Secondary | ICD-10-CM | POA: Diagnosis not present

## 2012-10-23 DIAGNOSIS — F331 Major depressive disorder, recurrent, moderate: Secondary | ICD-10-CM | POA: Diagnosis not present

## 2012-10-28 DIAGNOSIS — F331 Major depressive disorder, recurrent, moderate: Secondary | ICD-10-CM | POA: Diagnosis not present

## 2012-10-29 DIAGNOSIS — S336XXA Sprain of sacroiliac joint, initial encounter: Secondary | ICD-10-CM | POA: Diagnosis not present

## 2012-10-29 DIAGNOSIS — M999 Biomechanical lesion, unspecified: Secondary | ICD-10-CM | POA: Diagnosis not present

## 2012-10-30 DIAGNOSIS — F0390 Unspecified dementia without behavioral disturbance: Secondary | ICD-10-CM | POA: Diagnosis not present

## 2012-10-31 DIAGNOSIS — S336XXA Sprain of sacroiliac joint, initial encounter: Secondary | ICD-10-CM | POA: Diagnosis not present

## 2012-10-31 DIAGNOSIS — M999 Biomechanical lesion, unspecified: Secondary | ICD-10-CM | POA: Diagnosis not present

## 2012-11-03 DIAGNOSIS — F331 Major depressive disorder, recurrent, moderate: Secondary | ICD-10-CM | POA: Diagnosis not present

## 2012-11-05 DIAGNOSIS — S336XXA Sprain of sacroiliac joint, initial encounter: Secondary | ICD-10-CM | POA: Diagnosis not present

## 2012-11-05 DIAGNOSIS — M999 Biomechanical lesion, unspecified: Secondary | ICD-10-CM | POA: Diagnosis not present

## 2012-11-06 DIAGNOSIS — F331 Major depressive disorder, recurrent, moderate: Secondary | ICD-10-CM | POA: Diagnosis not present

## 2012-11-10 DIAGNOSIS — F331 Major depressive disorder, recurrent, moderate: Secondary | ICD-10-CM | POA: Diagnosis not present

## 2012-11-11 ENCOUNTER — Ambulatory Visit (AMBULATORY_SURGERY_CENTER): Payer: Medicare Other

## 2012-11-11 VITALS — Ht 62.0 in | Wt 181.4 lb

## 2012-11-11 DIAGNOSIS — Z800 Family history of malignant neoplasm of digestive organs: Secondary | ICD-10-CM

## 2012-11-11 DIAGNOSIS — Z1211 Encounter for screening for malignant neoplasm of colon: Secondary | ICD-10-CM

## 2012-11-11 MED ORDER — MOVIPREP 100 G PO SOLR: ORAL | Status: DC

## 2012-11-12 DIAGNOSIS — S336XXA Sprain of sacroiliac joint, initial encounter: Secondary | ICD-10-CM | POA: Diagnosis not present

## 2012-11-12 DIAGNOSIS — F331 Major depressive disorder, recurrent, moderate: Secondary | ICD-10-CM | POA: Diagnosis not present

## 2012-11-12 DIAGNOSIS — M999 Biomechanical lesion, unspecified: Secondary | ICD-10-CM | POA: Diagnosis not present

## 2012-11-13 ENCOUNTER — Ambulatory Visit
Admission: RE | Admit: 2012-11-13 | Discharge: 2012-11-13 | Disposition: A | Discharge status: Home / Self Care | Payer: Medicare Other | Source: Ambulatory Visit | Attending: Obstetrics & Gynecology | Admitting: Obstetrics & Gynecology

## 2012-11-13 DIAGNOSIS — F331 Major depressive disorder, recurrent, moderate: Secondary | ICD-10-CM | POA: Diagnosis not present

## 2012-11-13 DIAGNOSIS — N6019 Diffuse cystic mastopathy of unspecified breast: Secondary | ICD-10-CM | POA: Diagnosis not present

## 2012-11-13 DIAGNOSIS — N6320 Unspecified lump in the left breast, unspecified quadrant: Secondary | ICD-10-CM

## 2012-11-14 ENCOUNTER — Other Ambulatory Visit: Payer: Self-pay | Admitting: Physician Assistant

## 2012-11-14 NOTE — Telephone Encounter (Signed)
Patient is calling about her refill on proctasall? Please call patient at [phone]

## 2012-11-14 NOTE — Telephone Encounter (Signed)
Dr L, do you want to give pt any RFs?

## 2012-11-17 DIAGNOSIS — F331 Major depressive disorder, recurrent, moderate: Secondary | ICD-10-CM | POA: Diagnosis not present

## 2012-11-18 DIAGNOSIS — F331 Major depressive disorder, recurrent, moderate: Secondary | ICD-10-CM | POA: Diagnosis not present

## 2012-11-19 DIAGNOSIS — F331 Major depressive disorder, recurrent, moderate: Secondary | ICD-10-CM | POA: Diagnosis not present

## 2012-11-20 DIAGNOSIS — F331 Major depressive disorder, recurrent, moderate: Secondary | ICD-10-CM | POA: Diagnosis not present

## 2012-11-25 DIAGNOSIS — R946 Abnormal results of thyroid function studies: Secondary | ICD-10-CM | POA: Diagnosis not present

## 2012-11-25 DIAGNOSIS — R1310 Dysphagia, unspecified: Secondary | ICD-10-CM | POA: Diagnosis not present

## 2012-11-25 DIAGNOSIS — E785 Hyperlipidemia, unspecified: Secondary | ICD-10-CM | POA: Diagnosis not present

## 2012-11-25 DIAGNOSIS — E042 Nontoxic multinodular goiter: Secondary | ICD-10-CM | POA: Diagnosis not present

## 2012-11-25 DIAGNOSIS — Z8349 Family history of other endocrine, nutritional and metabolic diseases: Secondary | ICD-10-CM | POA: Diagnosis not present

## 2012-11-25 DIAGNOSIS — Z833 Family history of diabetes mellitus: Secondary | ICD-10-CM | POA: Diagnosis not present

## 2012-11-25 DIAGNOSIS — Z8632 Personal history of gestational diabetes: Secondary | ICD-10-CM | POA: Diagnosis not present

## 2012-11-26 DIAGNOSIS — H00029 Hordeolum internum unspecified eye, unspecified eyelid: Secondary | ICD-10-CM | POA: Diagnosis not present

## 2012-11-26 DIAGNOSIS — F331 Major depressive disorder, recurrent, moderate: Secondary | ICD-10-CM | POA: Diagnosis not present

## 2012-11-27 ENCOUNTER — Ambulatory Visit (AMBULATORY_SURGERY_CENTER): Payer: Medicare Other | Admitting: Internal Medicine

## 2012-11-27 ENCOUNTER — Encounter: Payer: Self-pay | Admitting: Internal Medicine

## 2012-11-27 VITALS — BP 132/85 | HR 80 | Temp 97.5°F | Resp 16 | Ht 62.0 in | Wt 181.0 lb

## 2012-11-27 DIAGNOSIS — Z1211 Encounter for screening for malignant neoplasm of colon: Secondary | ICD-10-CM | POA: Diagnosis not present

## 2012-11-27 DIAGNOSIS — J45909 Unspecified asthma, uncomplicated: Secondary | ICD-10-CM | POA: Diagnosis not present

## 2012-11-27 DIAGNOSIS — K5730 Diverticulosis of large intestine without perforation or abscess without bleeding: Secondary | ICD-10-CM | POA: Diagnosis not present

## 2012-11-27 DIAGNOSIS — I10 Essential (primary) hypertension: Secondary | ICD-10-CM | POA: Diagnosis not present

## 2012-11-27 DIAGNOSIS — E669 Obesity, unspecified: Secondary | ICD-10-CM | POA: Diagnosis not present

## 2012-11-27 DIAGNOSIS — Z800 Family history of malignant neoplasm of digestive organs: Secondary | ICD-10-CM

## 2012-11-27 MED ORDER — SODIUM CHLORIDE 0.9 % IV SOLN: 500.0000 mL | INTRAVENOUS | Status: DC

## 2012-11-27 NOTE — Op Note (Signed)
[facility] [address] [place], [address]   COLONOSCOPY PROCEDURE REPORT  PATIENT: Tamara [name], Tamara [name]  MR#: [id number] BIRTHDATE: 1960/09/04 , 51  yrs. old GENDER: Female ENDOSCOPIST: Roxy Cedar, MD REFERRED [id number] Recall PROCEDURE DATE:  11/27/2012 PROCEDURE:   Colonoscopy, screening First Screening Colonoscopy? (Avg.  risk and 50 yrs.  old or older) Yes.  Prior Negative Screening? (Now for repeat screening, >10 yrs) Yes.      Polyps Removed Today? No.  Recommend repeat exam, <10 yrs? No. ASA CLASS:   Class II INDICATIONS:Average risk patient for colon cancer.   Negative exam 10-2002 (iron deficiency anemia) MEDICATIONS: MAC sedation, administered by CRNA and propofol (Diprivan) 300mg  IV  DESCRIPTION OF PROCEDURE:   After the risks benefits and alternatives of the procedure were thoroughly explained, informed consent was obtained.  A digital rectal exam revealed no abnormalities of the rectum.   The [id number]  endoscope was introduced through the anus and advanced to the cecum, which was identified by both the appendix and ileocecal valve. No adverse events experienced.   The quality of the prep was excellent, using MoviPrep  The instrument was then slowly withdrawn as the colon was fully examined.      COLON FINDINGS: Mild diverticulosis was noted throughout the entire examined colon.   The colon mucosa was otherwise normal. Retroflexed views revealed internal hemorrhoids. The time to cecum=5 minutes 50 seconds.  Withdrawal time=8 minutes 43 seconds. The scope was withdrawn and the procedure completed. COMPLICATIONS: There were no complications.  ENDOSCOPIC IMPRESSION: 1. Mild diverticulosis was noted throughout the entire examined colon  RECOMMENDATIONS: 1. Continue current colorectal screening recommendations for "routine risk" patients with a repeat colonoscopy in 10 years.   eSigned:  Roxy Cedar, MD 11/27/2012 11:16  AM   cc: Elvina Sidle, MD and The Patient

## 2012-11-27 NOTE — Patient Instructions (Signed)
YOU HAD AN ENDOSCOPIC PROCEDURE TODAY AT THE [facility]: Refer to the procedure report that was given to you for any specific questions about what was found during the examination.  If the procedure report does not answer your questions, please call your gastroenterologist to clarify.  If you requested that your care partner not be given the details of your procedure findings, then the procedure report has been included in a sealed envelope for you to review at your convenience later.  YOU SHOULD EXPECT: Some feelings of bloating in the abdomen. Passage of more gas than usual.  Walking can help get rid of the air that was put into your GI tract during the procedure and reduce the bloating. If you had a lower endoscopy (such as a colonoscopy or flexible sigmoidoscopy) you may notice spotting of blood in your stool or on the toilet paper. If you underwent a bowel prep for your procedure, then you may not have a normal bowel movement for a few days.  DIET: Your first meal following the procedure should be a light meal and then it is ok to progress to your normal diet.  A half-sandwich or bowl of soup is an example of a good first meal.  Heavy or fried foods are harder to digest and may make you feel nauseous or bloated.  Likewise meals heavy in dairy and vegetables can cause extra gas to form and this can also increase the bloating.  Drink plenty of fluids but you should avoid alcoholic beverages for 24 hours.  ACTIVITY: Your care partner should take you home directly after the procedure.  You should plan to take it easy, moving slowly for the rest of the day.  You can resume normal activity the day after the procedure however you should NOT DRIVE or use heavy machinery for 24 hours (because of the sedation medicines used during the test).    SYMPTOMS TO REPORT IMMEDIATELY: A gastroenterologist can be reached at any hour.  During normal business hours, 8:30 AM to 5:00 PM Monday through Friday,  call [phone].  After hours and on weekends, please call the GI answering service at [phone] who will take a message and have the physician on call contact you.   Following lower endoscopy (colonoscopy or flexible sigmoidoscopy):  Excessive amounts of blood in the stool  Significant tenderness or worsening of abdominal pains  Swelling of the abdomen that is new, acute  Fever of 100F or higher    FOLLOW UP: If any biopsies were taken you will be contacted by phone or by letter within the next 1-3 weeks.  Call your gastroenterologist if you have not heard about the biopsies in 3 weeks.  Our staff will call the home number listed on your records the next business day following your procedure to check on you and address any questions or concerns that you may have at that time regarding the information given to you following your procedure. This is a courtesy call and so if there is no answer at the home number and we have not heard from you through the emergency physician on call, we will assume that you have returned to your regular daily activities without incident.  SIGNATURES/CONFIDENTIALITY: You and/or your care partner have signed paperwork which will be entered into your electronic medical record.  These signatures attest to the fact that that the information above on your After Visit Summary has been reviewed and is understood.  Full responsibility of the confidentiality   of this discharge information lies with you and/or your care-partner.     

## 2012-11-27 NOTE — Progress Notes (Signed)
Pt has poison ivy on right wrist , and left forearn.  Covered with gauze per pt. Request.

## 2012-11-27 NOTE — Progress Notes (Signed)
Patient did not experience any of the following events: a burn prior to discharge; a fall within the facility; wrong site/side/patient/procedure/implant event; or a hospital transfer or hospital admission upon discharge from the facility. (G8907) Patient did not have preoperative order for IV antibiotic SSI prophylaxis. (G8918)  

## 2012-11-27 NOTE — Progress Notes (Signed)
Procedure ends, to recovery awake, report given and VSS. 

## 2012-11-28 ENCOUNTER — Telehealth: Payer: Self-pay | Admitting: *Deleted

## 2012-11-28 NOTE — Telephone Encounter (Signed)
  Follow up Call-  Call back number 11/27/2012  Post procedure Call Back phone  # [phone]  Permission to leave phone message Yes     Patient questions:  Do you have a fever, pain , or abdominal swelling? no Pain Score  0 *  Have you tolerated food without any problems? yes  Have you been able to return to your normal activities? yes  Do you have any questions about your discharge instructions: Diet   no Medications  no Follow up visit  no  Do you have questions or concerns about your Care? no  Actions: * If pain score is 4 or above: No action needed, pain <4. Pt stated she has had a headache since yest. Has not tried any tylenol or motrin yet. Did eat with no problems, no vomiting. Instructed pt to try tylenol or motrin and to really hydrate herself today with water, gatorade etc and if no improvement let us know. Pt verbalized understanding.

## 2012-12-01 DIAGNOSIS — H16229 Keratoconjunctivitis sicca, not specified as Sjogren's, unspecified eye: Secondary | ICD-10-CM | POA: Diagnosis not present

## 2012-12-01 DIAGNOSIS — F331 Major depressive disorder, recurrent, moderate: Secondary | ICD-10-CM | POA: Diagnosis not present

## 2012-12-03 DIAGNOSIS — M999 Biomechanical lesion, unspecified: Secondary | ICD-10-CM | POA: Diagnosis not present

## 2012-12-03 DIAGNOSIS — S336XXA Sprain of sacroiliac joint, initial encounter: Secondary | ICD-10-CM | POA: Diagnosis not present

## 2012-12-03 DIAGNOSIS — F331 Major depressive disorder, recurrent, moderate: Secondary | ICD-10-CM | POA: Diagnosis not present

## 2012-12-08 DIAGNOSIS — H16229 Keratoconjunctivitis sicca, not specified as Sjogren's, unspecified eye: Secondary | ICD-10-CM | POA: Diagnosis not present

## 2012-12-09 DIAGNOSIS — F331 Major depressive disorder, recurrent, moderate: Secondary | ICD-10-CM | POA: Diagnosis not present

## 2012-12-11 DIAGNOSIS — F331 Major depressive disorder, recurrent, moderate: Secondary | ICD-10-CM | POA: Diagnosis not present

## 2012-12-23 DIAGNOSIS — F331 Major depressive disorder, recurrent, moderate: Secondary | ICD-10-CM | POA: Diagnosis not present

## 2012-12-24 DIAGNOSIS — F331 Major depressive disorder, recurrent, moderate: Secondary | ICD-10-CM | POA: Diagnosis not present

## 2012-12-30 DIAGNOSIS — F331 Major depressive disorder, recurrent, moderate: Secondary | ICD-10-CM | POA: Diagnosis not present

## 2012-12-31 ENCOUNTER — Other Ambulatory Visit: Payer: Self-pay | Admitting: Internal Medicine

## 2012-12-31 NOTE — Telephone Encounter (Signed)
Patient would like to know if her inhaler can be called in she is out completely the pharmacy has faxed over the request

## 2013-01-01 ENCOUNTER — Telehealth: Payer: Self-pay

## 2013-01-01 DIAGNOSIS — F331 Major depressive disorder, recurrent, moderate: Secondary | ICD-10-CM | POA: Diagnosis not present

## 2013-01-01 MED ORDER — ALBUTEROL SULFATE HFA 108 (90 BASE) MCG/ACT IN AERS: 2.0000 | INHALATION_SPRAY | Freq: Four times a day (QID) | RESPIRATORY_TRACT | Status: DC | PRN

## 2013-01-01 NOTE — Telephone Encounter (Signed)
Patient advised.

## 2013-01-01 NOTE — Telephone Encounter (Addendum)
Pt states that she needs a refill on her inhaler asap because she is having difficulty breathing, advised pt to come in but she wanted me to just get someone to fill her inhaler asap.. Pt states that the pharmacy sent over a rx request for albuterol hca but she would like to have albuteral hfa. Best#  [phone] [facility] [address]

## 2013-01-01 NOTE — Telephone Encounter (Signed)
Patient saw Dr. Milus Glazier in March and they discussed the inhaler then along with her other refills, but must have forgotten it.    She says she prefers the [facility] inhaler compared to the HCA.  Says it works better.  She is not having a full blown asthma attack, but when it is so hot, she can just tell a difference in her breathing.  Says if she was having an attack she would definitely come in.  Please call [phone]

## 2013-01-01 NOTE — Telephone Encounter (Signed)
This was sent in already

## 2013-01-01 NOTE — Telephone Encounter (Signed)
Will call in hfa inhaler

## 2013-01-01 NOTE — Telephone Encounter (Signed)
Not appropriate to do this without office visit, called her and advised her to come in. She states Dr Milus Glazier will do this without visit. Please advise.

## 2013-01-05 DIAGNOSIS — F331 Major depressive disorder, recurrent, moderate: Secondary | ICD-10-CM | POA: Diagnosis not present

## 2013-01-06 DIAGNOSIS — E042 Nontoxic multinodular goiter: Secondary | ICD-10-CM | POA: Diagnosis not present

## 2013-01-08 DIAGNOSIS — F331 Major depressive disorder, recurrent, moderate: Secondary | ICD-10-CM | POA: Diagnosis not present

## 2013-01-09 DIAGNOSIS — F05 Delirium due to known physiological condition: Secondary | ICD-10-CM | POA: Diagnosis not present

## 2013-01-09 DIAGNOSIS — F0390 Unspecified dementia without behavioral disturbance: Secondary | ICD-10-CM | POA: Diagnosis not present

## 2013-01-13 DIAGNOSIS — F331 Major depressive disorder, recurrent, moderate: Secondary | ICD-10-CM | POA: Diagnosis not present

## 2013-01-14 DIAGNOSIS — S336XXA Sprain of sacroiliac joint, initial encounter: Secondary | ICD-10-CM | POA: Diagnosis not present

## 2013-01-14 DIAGNOSIS — M999 Biomechanical lesion, unspecified: Secondary | ICD-10-CM | POA: Diagnosis not present

## 2013-01-15 DIAGNOSIS — F331 Major depressive disorder, recurrent, moderate: Secondary | ICD-10-CM | POA: Diagnosis not present

## 2013-01-26 DIAGNOSIS — F331 Major depressive disorder, recurrent, moderate: Secondary | ICD-10-CM | POA: Diagnosis not present

## 2013-01-27 DIAGNOSIS — M999 Biomechanical lesion, unspecified: Secondary | ICD-10-CM | POA: Diagnosis not present

## 2013-01-27 DIAGNOSIS — S336XXA Sprain of sacroiliac joint, initial encounter: Secondary | ICD-10-CM | POA: Diagnosis not present

## 2013-01-28 DIAGNOSIS — F331 Major depressive disorder, recurrent, moderate: Secondary | ICD-10-CM | POA: Diagnosis not present

## 2013-02-02 DIAGNOSIS — F331 Major depressive disorder, recurrent, moderate: Secondary | ICD-10-CM | POA: Diagnosis not present

## 2013-02-24 ENCOUNTER — Other Ambulatory Visit: Payer: Self-pay | Admitting: Family Medicine

## 2013-02-25 NOTE — Telephone Encounter (Signed)
Needs OV.  

## 2013-03-02 ENCOUNTER — Telehealth: Payer: Self-pay

## 2013-03-02 NOTE — Telephone Encounter (Signed)
Needs to be seen

## 2013-03-02 NOTE — Telephone Encounter (Signed)
Pt needs doxycycline called in to [facility] pharmacy line at [phone].  Call pt at [phone].

## 2013-03-02 NOTE — Telephone Encounter (Signed)
Please advise 

## 2013-03-04 NOTE — Telephone Encounter (Signed)
Patient advised on voicemail

## 2013-03-05 ENCOUNTER — Other Ambulatory Visit: Payer: Self-pay | Admitting: Family Medicine

## 2013-03-05 DIAGNOSIS — R21 Rash and other nonspecific skin eruption: Secondary | ICD-10-CM

## 2013-03-05 MED ORDER — DOXYCYCLINE MONOHYDRATE 100 MG PO CAPS: 100.0000 mg | ORAL_CAPSULE | Freq: Two times a day (BID) | ORAL | Status: DC

## 2013-03-05 NOTE — Telephone Encounter (Signed)
Pt CB and stated that she does not need doxy for an infection, Dr L Rxs it for her cystic acne. She stated that she normally just sees him for CPE/med refills once a year. Pt is currently at [employer] with her father and asked that we send request back to Dr L and see if he will fill it for the rest of the year, or at least 90 days to [employer] order. Also please advise on how often you need to see pt for check-ups.

## 2013-03-05 NOTE — Telephone Encounter (Signed)
Pt asks if we can please leave detailed message on her VM if she doesn't answer so that she will not have to try to call us back again.

## 2013-03-07 NOTE — Telephone Encounter (Signed)
Notified pt that Rx was sent for 6 mos RFs.

## 2013-03-26 DIAGNOSIS — F331 Major depressive disorder, recurrent, moderate: Secondary | ICD-10-CM | POA: Diagnosis not present

## 2013-03-31 DIAGNOSIS — F331 Major depressive disorder, recurrent, moderate: Secondary | ICD-10-CM | POA: Diagnosis not present

## 2013-04-06 ENCOUNTER — Other Ambulatory Visit: Payer: Self-pay | Admitting: Obstetrics & Gynecology

## 2013-04-06 DIAGNOSIS — N6002 Solitary cyst of left breast: Secondary | ICD-10-CM

## 2013-04-07 DIAGNOSIS — F331 Major depressive disorder, recurrent, moderate: Secondary | ICD-10-CM | POA: Diagnosis not present

## 2013-04-09 DIAGNOSIS — F0390 Unspecified dementia without behavioral disturbance: Secondary | ICD-10-CM | POA: Diagnosis not present

## 2013-04-13 DIAGNOSIS — H1045 Other chronic allergic conjunctivitis: Secondary | ICD-10-CM | POA: Diagnosis not present

## 2013-04-14 DIAGNOSIS — S336XXA Sprain of sacroiliac joint, initial encounter: Secondary | ICD-10-CM | POA: Diagnosis not present

## 2013-04-14 DIAGNOSIS — F331 Major depressive disorder, recurrent, moderate: Secondary | ICD-10-CM | POA: Diagnosis not present

## 2013-04-14 DIAGNOSIS — M999 Biomechanical lesion, unspecified: Secondary | ICD-10-CM | POA: Diagnosis not present

## 2013-04-16 DIAGNOSIS — F331 Major depressive disorder, recurrent, moderate: Secondary | ICD-10-CM | POA: Diagnosis not present

## 2013-04-17 ENCOUNTER — Other Ambulatory Visit: Payer: Self-pay

## 2013-04-17 DIAGNOSIS — F329 Major depressive disorder, single episode, unspecified: Secondary | ICD-10-CM

## 2013-04-17 MED ORDER — LEVOTHYROXINE SODIUM 25 MCG PO TABS: 25.0000 ug | ORAL_TABLET | Freq: Every day | ORAL | Status: DC

## 2013-04-23 DIAGNOSIS — F331 Major depressive disorder, recurrent, moderate: Secondary | ICD-10-CM | POA: Diagnosis not present

## 2013-04-28 DIAGNOSIS — F331 Major depressive disorder, recurrent, moderate: Secondary | ICD-10-CM | POA: Diagnosis not present

## 2013-04-30 ENCOUNTER — Ambulatory Visit (INDEPENDENT_AMBULATORY_CARE_PROVIDER_SITE_OTHER): Payer: Medicare Other | Admitting: Family Medicine

## 2013-04-30 DIAGNOSIS — Z23 Encounter for immunization: Secondary | ICD-10-CM | POA: Diagnosis not present

## 2013-05-05 ENCOUNTER — Other Ambulatory Visit: Payer: Self-pay | Admitting: Internal Medicine

## 2013-05-05 DIAGNOSIS — E049 Nontoxic goiter, unspecified: Secondary | ICD-10-CM

## 2013-05-07 ENCOUNTER — Other Ambulatory Visit: Payer: Medicare Other

## 2013-05-11 DIAGNOSIS — E042 Nontoxic multinodular goiter: Secondary | ICD-10-CM | POA: Diagnosis not present

## 2013-05-12 DIAGNOSIS — Z833 Family history of diabetes mellitus: Secondary | ICD-10-CM | POA: Diagnosis not present

## 2013-05-12 DIAGNOSIS — E042 Nontoxic multinodular goiter: Secondary | ICD-10-CM | POA: Diagnosis not present

## 2013-05-12 DIAGNOSIS — Z8632 Personal history of gestational diabetes: Secondary | ICD-10-CM | POA: Diagnosis not present

## 2013-05-12 DIAGNOSIS — Z8349 Family history of other endocrine, nutritional and metabolic diseases: Secondary | ICD-10-CM | POA: Diagnosis not present

## 2013-05-20 ENCOUNTER — Other Ambulatory Visit: Payer: Medicare Other

## 2013-06-01 ENCOUNTER — Ambulatory Visit
Admission: RE | Admit: 2013-06-01 | Discharge: 2013-06-01 | Disposition: A | Discharge status: Home / Self Care | Payer: Medicare Other | Source: Ambulatory Visit | Attending: Obstetrics & Gynecology | Admitting: Obstetrics & Gynecology

## 2013-06-01 ENCOUNTER — Other Ambulatory Visit: Payer: Self-pay | Admitting: Obstetrics & Gynecology

## 2013-06-01 DIAGNOSIS — N6002 Solitary cyst of left breast: Secondary | ICD-10-CM

## 2013-06-01 DIAGNOSIS — N6019 Diffuse cystic mastopathy of unspecified breast: Secondary | ICD-10-CM | POA: Diagnosis not present

## 2013-06-01 DIAGNOSIS — N6489 Other specified disorders of breast: Secondary | ICD-10-CM | POA: Diagnosis not present

## 2013-06-02 ENCOUNTER — Ambulatory Visit
Admission: RE | Admit: 2013-06-02 | Discharge: 2013-06-02 | Disposition: A | Discharge status: Home / Self Care | Payer: Medicare Other | Source: Ambulatory Visit | Attending: Internal Medicine | Admitting: Internal Medicine

## 2013-06-02 ENCOUNTER — Other Ambulatory Visit: Payer: Self-pay | Admitting: Internal Medicine

## 2013-06-02 DIAGNOSIS — E041 Nontoxic single thyroid nodule: Secondary | ICD-10-CM | POA: Diagnosis not present

## 2013-06-02 DIAGNOSIS — E042 Nontoxic multinodular goiter: Secondary | ICD-10-CM | POA: Diagnosis not present

## 2013-06-02 DIAGNOSIS — E049 Nontoxic goiter, unspecified: Secondary | ICD-10-CM

## 2013-06-03 ENCOUNTER — Other Ambulatory Visit (INDEPENDENT_AMBULATORY_CARE_PROVIDER_SITE_OTHER): Payer: Self-pay | Admitting: Surgery

## 2013-06-04 ENCOUNTER — Encounter: Payer: Self-pay | Admitting: Family Medicine

## 2013-06-04 ENCOUNTER — Ambulatory Visit (INDEPENDENT_AMBULATORY_CARE_PROVIDER_SITE_OTHER): Payer: Medicare Other | Admitting: Family Medicine

## 2013-06-04 VITALS — BP 98/70 | HR 98 | Temp 99.0°F | Resp 16 | Ht 62.0 in | Wt 173.0 lb

## 2013-06-04 DIAGNOSIS — M545 Low back pain, unspecified: Secondary | ICD-10-CM

## 2013-06-04 DIAGNOSIS — K219 Gastro-esophageal reflux disease without esophagitis: Secondary | ICD-10-CM

## 2013-06-04 DIAGNOSIS — R21 Rash and other nonspecific skin eruption: Secondary | ICD-10-CM | POA: Diagnosis not present

## 2013-06-04 DIAGNOSIS — L708 Other acne: Secondary | ICD-10-CM | POA: Diagnosis not present

## 2013-06-04 DIAGNOSIS — L700 Acne vulgaris: Secondary | ICD-10-CM

## 2013-06-04 LAB — POCT URINALYSIS DIPSTICK
Bilirubin, UA: NEGATIVE
Blood, UA: NEGATIVE
Glucose, UA: NEGATIVE
Ketones, UA: NEGATIVE
Leukocytes, UA: NEGATIVE
Nitrite, UA: NEGATIVE
Protein, UA: NEGATIVE
Spec Grav, UA: 1.02
Urobilinogen, UA: 0.2
pH, UA: 6

## 2013-06-04 MED ORDER — DOXYCYCLINE HYCLATE 100 MG PO TABS: 100.0000 mg | ORAL_TABLET | Freq: Two times a day (BID) | ORAL | Status: DC

## 2013-06-04 MED ORDER — DEXLANSOPRAZOLE 60 MG PO CPDR: 60.0000 mg | DELAYED_RELEASE_CAPSULE | Freq: Every day | ORAL | Status: DC

## 2013-06-04 NOTE — Progress Notes (Signed)
This is a 52 y.o.female who complains of low back pain.  Character of pain: chronic aching and back "seizes" up x 1 minute every three days often after bending over. Location of pain:  Lumbar, right side Radiation of pain:  none Onset associated with:  Sleeping in hospital couches, recliners since August; stress:  Father had cardiac arrest in August, mother has MDS needing transfusions, daughter and father living at her home now.  Parents are separated.  Dad is 'meaner than a snake'. Patient has a past history of low back pain for which   Tamara [name] denies any urinary symptoms, bowel problems, numbness in the legs, loss of motor power. Tamara [name] had no fever.  Tamara [name] Tamara [name] has tried diclofenac with small improvement.  Past Medical History  Diagnosis Date  . Depression   . HTN (hypertension)   . Hyperlipemia   . Allergy   . GERD (gastroesophageal reflux disease)   . Neuromuscular disorder   . Anemia   . Anxiety   . Asthma   . Shock therapy as cause of abnormal reaction of patient or of later complication without mention of misadventure at time of procedure   . History of short term memory loss      Past Surgical History  Procedure Laterality Date  . Cholecystectomy    . Abdominal hysterectomy      partial  . Colonoscopy      Objective:  middle-aged female in no acute distress. Blood pressure 98/70, pulse 98, temperature 99 F (37.2 C), temperature source Oral, resp. rate 16, height 5\' 2"  (1.575 m), weight 173 lb (78.472 kg), SpO2 98.00%.Body mass index is 31.63 kg/(m^2). Palpation of the back reveals no pain CVA:  nontender Abdomen: soft and nontender Peripheral pulses:  DP/PT normal Inspection of the back: Reveals no scoliosis Straight-leg raising: negative Motor exam of lower extremity: No abnormal weakness. Reflexes: Symmetric and normal Skin exam: normal  Assessment/Plan: Acute lower back pain without acute neurological findings.   Low back  pain - Plan: Ambulatory referral to Physical Therapy  Rash and nonspecific skin eruption  Cystic acne - Plan: doxycycline (VIBRA-TABS) 100 MG tablet  Signed, Tamara Sidle, MD  I spent 45 minutes face to face with patient as she described her sources of stress, including all the details of her father's recent cardiac surgery at [facility].  Results for orders placed in visit on 06/04/13  POCT URINALYSIS DIPSTICK      Result Value Range   Color, UA yellow     Clarity, UA clear     Glucose, UA neg     Bilirubin, UA neg     Ketones, UA neg     Spec Grav, UA 1.020     Blood, UA neg     pH, UA 6.0     Protein, UA neg     Urobilinogen, UA 0.2     Nitrite, UA neg     Leukocytes, UA Negative

## 2013-06-06 ENCOUNTER — Other Ambulatory Visit: Payer: Self-pay | Admitting: Family Medicine

## 2013-06-08 DIAGNOSIS — H1045 Other chronic allergic conjunctivitis: Secondary | ICD-10-CM | POA: Diagnosis not present

## 2013-06-10 DIAGNOSIS — S139XXA Sprain of joints and ligaments of unspecified parts of neck, initial encounter: Secondary | ICD-10-CM | POA: Diagnosis not present

## 2013-06-10 DIAGNOSIS — M9981 Other biomechanical lesions of cervical region: Secondary | ICD-10-CM | POA: Diagnosis not present

## 2013-06-17 DIAGNOSIS — M9981 Other biomechanical lesions of cervical region: Secondary | ICD-10-CM | POA: Diagnosis not present

## 2013-06-17 DIAGNOSIS — M545 Low back pain: Secondary | ICD-10-CM | POA: Diagnosis not present

## 2013-06-17 DIAGNOSIS — S139XXA Sprain of joints and ligaments of unspecified parts of neck, initial encounter: Secondary | ICD-10-CM | POA: Diagnosis not present

## 2013-06-18 DIAGNOSIS — F331 Major depressive disorder, recurrent, moderate: Secondary | ICD-10-CM | POA: Diagnosis not present

## 2013-06-22 DIAGNOSIS — F331 Major depressive disorder, recurrent, moderate: Secondary | ICD-10-CM | POA: Diagnosis not present

## 2013-06-23 DIAGNOSIS — F0390 Unspecified dementia without behavioral disturbance: Secondary | ICD-10-CM | POA: Diagnosis not present

## 2013-06-24 DIAGNOSIS — M9981 Other biomechanical lesions of cervical region: Secondary | ICD-10-CM | POA: Diagnosis not present

## 2013-06-24 DIAGNOSIS — S139XXA Sprain of joints and ligaments of unspecified parts of neck, initial encounter: Secondary | ICD-10-CM | POA: Diagnosis not present

## 2013-06-29 ENCOUNTER — Other Ambulatory Visit: Payer: Self-pay | Admitting: Family Medicine

## 2013-06-29 DIAGNOSIS — F331 Major depressive disorder, recurrent, moderate: Secondary | ICD-10-CM | POA: Diagnosis not present

## 2013-06-30 NOTE — Telephone Encounter (Signed)
Dr L, you saw pt this month but don't see discussion about asthma. Can we RF for pt?

## 2013-07-01 DIAGNOSIS — M545 Low back pain: Secondary | ICD-10-CM | POA: Diagnosis not present

## 2013-07-02 ENCOUNTER — Telehealth: Payer: Self-pay

## 2013-07-02 NOTE — Telephone Encounter (Signed)
Received approval from [facility] for free meds for pt through 07/01/14.  I have put ph and fax on pt Snapshot page.

## 2013-07-20 DIAGNOSIS — F331 Major depressive disorder, recurrent, moderate: Secondary | ICD-10-CM | POA: Diagnosis not present

## 2013-07-22 DIAGNOSIS — M545 Low back pain, unspecified: Secondary | ICD-10-CM | POA: Diagnosis not present

## 2013-07-22 DIAGNOSIS — F331 Major depressive disorder, recurrent, moderate: Secondary | ICD-10-CM | POA: Diagnosis not present

## 2013-07-31 DIAGNOSIS — M545 Low back pain, unspecified: Secondary | ICD-10-CM | POA: Diagnosis not present

## 2013-08-04 DIAGNOSIS — F331 Major depressive disorder, recurrent, moderate: Secondary | ICD-10-CM | POA: Diagnosis not present

## 2013-08-06 DIAGNOSIS — M545 Low back pain, unspecified: Secondary | ICD-10-CM | POA: Diagnosis not present

## 2013-08-10 DIAGNOSIS — F331 Major depressive disorder, recurrent, moderate: Secondary | ICD-10-CM | POA: Diagnosis not present

## 2013-08-11 DIAGNOSIS — M545 Low back pain, unspecified: Secondary | ICD-10-CM | POA: Diagnosis not present

## 2013-08-13 DIAGNOSIS — M545 Low back pain, unspecified: Secondary | ICD-10-CM | POA: Diagnosis not present

## 2013-08-13 DIAGNOSIS — F331 Major depressive disorder, recurrent, moderate: Secondary | ICD-10-CM | POA: Diagnosis not present

## 2013-08-17 DIAGNOSIS — M545 Low back pain, unspecified: Secondary | ICD-10-CM | POA: Diagnosis not present

## 2013-08-17 DIAGNOSIS — F0392 Unspecified dementia, unspecified severity, with psychotic disturbance: Secondary | ICD-10-CM | POA: Diagnosis not present

## 2013-08-17 DIAGNOSIS — F0390 Unspecified dementia without behavioral disturbance: Secondary | ICD-10-CM | POA: Diagnosis not present

## 2013-08-19 DIAGNOSIS — M545 Low back pain, unspecified: Secondary | ICD-10-CM | POA: Diagnosis not present

## 2013-08-20 DIAGNOSIS — F331 Major depressive disorder, recurrent, moderate: Secondary | ICD-10-CM | POA: Diagnosis not present

## 2013-09-03 ENCOUNTER — Encounter: Payer: Medicare Other | Admitting: Family Medicine

## 2013-09-03 DIAGNOSIS — M545 Low back pain, unspecified: Secondary | ICD-10-CM | POA: Diagnosis not present

## 2013-09-03 DIAGNOSIS — F331 Major depressive disorder, recurrent, moderate: Secondary | ICD-10-CM | POA: Diagnosis not present

## 2013-09-07 DIAGNOSIS — M545 Low back pain, unspecified: Secondary | ICD-10-CM | POA: Diagnosis not present

## 2013-09-08 DIAGNOSIS — F331 Major depressive disorder, recurrent, moderate: Secondary | ICD-10-CM | POA: Diagnosis not present

## 2013-09-09 DIAGNOSIS — M545 Low back pain, unspecified: Secondary | ICD-10-CM | POA: Diagnosis not present

## 2013-09-10 ENCOUNTER — Encounter: Payer: Self-pay | Admitting: Family Medicine

## 2013-09-10 ENCOUNTER — Ambulatory Visit (INDEPENDENT_AMBULATORY_CARE_PROVIDER_SITE_OTHER): Payer: Medicare Other | Admitting: Family Medicine

## 2013-09-10 VITALS — BP 110/74 | HR 91 | Temp 98.3°F | Resp 16 | Ht 62.0 in | Wt 177.8 lb

## 2013-09-10 DIAGNOSIS — L708 Other acne: Secondary | ICD-10-CM | POA: Diagnosis not present

## 2013-09-10 DIAGNOSIS — I10 Essential (primary) hypertension: Secondary | ICD-10-CM | POA: Diagnosis not present

## 2013-09-10 DIAGNOSIS — J45909 Unspecified asthma, uncomplicated: Secondary | ICD-10-CM

## 2013-09-10 DIAGNOSIS — F331 Major depressive disorder, recurrent, moderate: Secondary | ICD-10-CM | POA: Diagnosis not present

## 2013-09-10 DIAGNOSIS — L700 Acne vulgaris: Secondary | ICD-10-CM

## 2013-09-10 DIAGNOSIS — Z0000 Encounter for general adult medical examination without abnormal findings: Secondary | ICD-10-CM | POA: Diagnosis not present

## 2013-09-10 LAB — LIPID PANEL
Cholesterol: 277 mg/dL — ABNORMAL HIGH (ref 0–200)
HDL: 47 mg/dL (ref >39)
LDL Cholesterol: 165 mg/dL — ABNORMAL HIGH (ref 0–99)
Total CHOL/HDL Ratio: 5.9 Ratio
Triglycerides: 324 mg/dL — ABNORMAL HIGH (ref <150)
VLDL: 65 mg/dL — ABNORMAL HIGH (ref 0–40)

## 2013-09-10 LAB — COMPLETE METABOLIC PANEL WITH GFR
ALT: 20 U/L (ref 0–35)
AST: 21 U/L (ref 0–37)
Albumin: 4.5 g/dL (ref 3.5–5.2)
Alkaline Phosphatase: 106 U/L (ref 39–117)
BUN: 17 mg/dL (ref 6–23)
CO2: 26 mEq/L (ref 19–32)
Calcium: 10.2 mg/dL (ref 8.4–10.5)
Chloride: 100 mEq/L (ref 96–112)
Creat: 0.84 mg/dL (ref 0.50–1.10)
GFR, Est African American: >89 mL/min
GFR, Est Non African American: 81 mL/min
Glucose, Bld: 85 mg/dL (ref 70–99)
Potassium: 3.9 mEq/L (ref 3.5–5.3)
Sodium: 136 mEq/L (ref 135–145)
Total Bilirubin: 1.3 mg/dL — ABNORMAL HIGH (ref 0.2–1.2)
Total Protein: 7.5 g/dL (ref 6.0–8.3)

## 2013-09-10 LAB — POCT URINALYSIS DIPSTICK
Bilirubin, UA: NEGATIVE
Blood, UA: NEGATIVE
Glucose, UA: NEGATIVE
Ketones, UA: NEGATIVE
Leukocytes, UA: NEGATIVE
Nitrite, UA: NEGATIVE
Protein, UA: NEGATIVE
Spec Grav, UA: 1.015
Urobilinogen, UA: 0.2
pH, UA: 7

## 2013-09-10 LAB — CBC
HCT: 41.8 % (ref 36.0–46.0)
Hemoglobin: 14.8 g/dL (ref 12.0–15.0)
MCH: 29.7 pg (ref 26.0–34.0)
MCHC: 35.4 g/dL (ref 30.0–36.0)
MCV: 83.9 fL (ref 78.0–100.0)
Platelets: 314 10*3/uL (ref 150–400)
RBC: 4.98 MIL/uL (ref 3.87–5.11)
RDW: 14 % (ref 11.5–15.5)
WBC: 8.2 10*3/uL (ref 4.0–10.5)

## 2013-09-10 MED ORDER — ALBUTEROL SULFATE HFA 108 (90 BASE) MCG/ACT IN AERS: 2.0000 | INHALATION_SPRAY | Freq: Four times a day (QID) | RESPIRATORY_TRACT | Status: DC | PRN

## 2013-09-10 MED ORDER — HYDROCORTISONE 2.5 % RE CREA: TOPICAL_CREAM | Freq: Two times a day (BID) | RECTAL | Status: DC

## 2013-09-10 MED ORDER — DOXYCYCLINE HYCLATE 100 MG PO TABS: 100.0000 mg | ORAL_TABLET | Freq: Two times a day (BID) | ORAL | Status: DC

## 2013-09-10 MED ORDER — DICLOFENAC SODIUM 1 % TD GEL: 2.0000 g | TRANSDERMAL | Status: DC | PRN

## 2013-09-10 MED ORDER — LOSARTAN POTASSIUM-HCTZ 50-12.5 MG PO TABS: 1.0000 | ORAL_TABLET | Freq: Every day | ORAL | Status: DC

## 2013-09-10 NOTE — Addendum Note (Signed)
Addended by: Yvette Rack on: 09/10/2013 12:42 PM   Modules accepted: Orders

## 2013-09-10 NOTE — Progress Notes (Signed)
Subjective:    Patient ID: Tamara [name], female    DOB: [birth year], 53 y.o.   MRN: [id number]  HPI   Patient ID: Tamara [name] MRN: [id number], DOB: 02-15-1961, 53 y.o. Date of Encounter: 09/10/2013, 10:41 AM  Primary Physician: [name], MD  Chief Complaint: Physical (CPE)  HPI: 53 y.o. y/o female with history of noted below here for CPE. HPI Comments: [name] is a 53 y.o. female who presents to the [facility] for a CPE. States she has been "stress eating" due to family health problems. Reports she is taking Xanax more frequently recently secondary to family health problems.   States Father had a triple bypass last year. Mother dx with bone cancer last year. States she sees Dr. Benjie Karvonen.   YBO:[age] MMG: 2014   Review of Systems: Consitutional: No fever, chills, fatigue, night sweats, lymphadenopathy, or weight changes. Eyes: No visual changes, eye redness, or discharge. Mild eye pain (possible allergy related).  ENT/Mouth: Ears: No otalgia, tinnitus, hearing loss, discharge. Nose: No congestion, rhinorrhea, sinus pain, or epistaxis. Throat: No sore throat, post nasal drip, or teeth pain. Cardiovascular: No CP, palpitations, diaphoresis, DOE, edema, orthopnea, PND. Respiratory: No cough, hemoptysis, SOB, or wheezing. Gastrointestinal: No anorexia, dysphagia, reflux, pain, nausea, vomiting, hematemesis, diarrhea, constipation, BRBPR, or melena. Breast: No discharge, pain, swelling, or mass. Genitourinary: No dysuria, frequency, urgency, hematuria, incontinence, nocturia, amenorrhea, vaginal discharge, pruritis, burning, abnormal bleeding, or pain. Musculoskeletal: No decreased ROM, myalgias, stiffness, joint swelling, or weakness. Skin: No rash, erythema, lesion changes, pain, warmth, jaundice, or pruritis. Neurological: No headache, dizziness, syncope, seizures, tremors, memory loss, coordination problems, or paresthesias. Psychological: No depression, hallucinations,  SI/HI. Positive for mild anxiety.  Endocrine: No fatigue, polydipsia, polyphagia, polyuria, or known diabetes. All other systems were reviewed and are otherwise negative.  Past Medical History  Diagnosis Date   Depression    HTN (hypertension)    Hyperlipemia    Allergy    GERD (gastroesophageal reflux disease)    Neuromuscular disorder    Anemia    Anxiety    Asthma    Shock therapy as cause of abnormal reaction of patient or of later complication without mention of misadventure at time of procedure    History of short term memory loss      Past Surgical History  Procedure Laterality Date   Cholecystectomy     Abdominal hysterectomy      partial   Colonoscopy      Home Meds:  Prior to Admission medications   Medication Sig Start Date End Date Taking? Authorizing Provider  ALPRAZolam Duanne Moron) 1 MG tablet Take 1 mg by mouth continuous as needed.    Historical Provider, MD  ALPRAZOLAM XR 1 MG 24 hr tablet Take 3 mg by mouth at bedtime.  08/10/11   Historical Provider, MD  aspirin 81 MG tablet Take 81 mg by mouth daily.    Historical Provider, MD  dexlansoprazole (DEXILANT) 60 MG capsule Take 1 capsule (60 mg total) by mouth daily. 08/18/11 09/17/11  Ryan M Dunn, PA-C  dexlansoprazole (DEXILANT) 60 MG capsule Take 1 capsule (60 mg total) by mouth daily. 06/04/13   [name], MD  diclofenac sodium (VOLTAREN) 1 % GEL Apply 2 g topically as needed. 07/19/12   [name], MD  doxycycline (VIBRA-TABS) 100 MG tablet Take 1 tablet (100 mg total) by mouth 2 (two) times daily. 06/04/13   [name], MD  levothyroxine (LEVOTHROID) 25 MCG tablet Take 1 tablet (25 mcg total)  by mouth daily. PATIENT NEEDS OFFICE VISIT FOR ADDITIONAL REFILLS 04/17/13 04/17/14  [name], MD  losartan-hydrochlorothiazide [facility]) 50-12.5 MG per tablet TAKE 1 TABLET BY MOUTH DAILY 06/06/13   Chelle S Jeffery, PA-C  niacin (NIASPAN) 500 MG CR tablet Take 1 tablet (500 mg total) by mouth  at bedtime. 11/28/11 11/27/12  Minus Breeding, MD  NON FORMULARY Take 4 mg by mouth as needed. Trilofan 4 mg    Historical Provider, MD  Oxcarbazepine (TRILEPTAL) 300 MG tablet Take 300 mg by mouth daily.     Historical Provider, MD  PROAIR HFA 108 (90 BASE) MCG/ACT inhaler INHALE 2 PUFFS BY MOUTH INTO THE LUNGS EVERY 6 (SIX) HOURS AS NEEDED FOR WHEEZING. 06/29/13   [name], MD  PROCTOSOL HC 2.5 % rectal cream APPLY TO AFFECTED AREA TWICE DAILY 11/14/12   [name], MD  temazepam (RESTORIL) 15 MG capsule Take 45 mg by mouth at bedtime as needed.  08/10/11   Historical Provider, MD  Vilazodone HCl (VIIBRYD) 20 MG TABS Take by mouth daily.    Historical Provider, MD    Allergies:  Allergies  Allergen Reactions   Adhesive [Tape]     This is tape as well as adhesive on bandaids.   Gabapentin     Unknown reaction   Ivp Dye [Iodinated Diagnostic Agents]    Propoxyphene Hcl Other (See Comments)    hallucination   Seroquel [Quetiapine Fumerate]     Unknown reaction   Penicillins Rash   Sulfonamide Derivatives Rash    History   Social History   Marital Status: Divorced    Spouse Name: N/A    Number of Children: N/A   Years of Education: N/A   Occupational History   Not on file.   Social History Main Topics   Smoking status: Never Smoker    Smokeless tobacco: Never Used   Alcohol Use: No   Drug Use: No   Sexual Activity: Yes    Birth Control/ Protection: None   Other Topics Concern   Not on file   Social History Narrative   Lives with boyfriend. Walks daily for 20 minutes. Education: [employer].    Family History  Problem Relation Age of Onset   Arrhythmia Father    Stroke Father    Hyperlipidemia Father    Coronary artery disease Maternal Grandfather 40    Died 58   Heart disease Maternal Grandfather    Cancer Mother    Hyperlipidemia Mother    Hearing loss Paternal Grandmother    Hearing loss Paternal Grandfather    Diabetes  Paternal Grandfather    Diabetes Daughter    Colon cancer Paternal Uncle     Physical Exam: Blood pressure 110/74, pulse 91, temperature 98.3 F (36.8 C), temperature source Oral, resp. rate 16, height 5\' 2"  (1.575 m), weight 177 lb 12.8 oz (80.65 kg), SpO2 98.00%., Body mass index is 32.51 kg/(m^2). General: Well developed, well nourished, in no acute distress. HEENT: Normocephalic, atraumatic. Conjunctiva pink, sclera non-icteric. Pupils 2 mm constricting to 1 mm, round, regular, and equally reactive to light and accomodation. EOMI. Internal auditory canal clear. TMs with good cone of light and without pathology. Nasal mucosa pink. Nares are without discharge. No sinus tenderness. Oral mucosa pink. Dentition normal. Pharynx without exudate.   Neck: Supple. Trachea midline. No thyromegaly. Full ROM. No lymphadenopathy. Lungs: Clear to auscultation bilaterally without wheezes, rales, or rhonchi. Breathing is of normal effort and unlabored. Cardiovascular: RRR with S1 S2. No murmurs, rubs,  or gallops appreciated. Distal pulses 2+ symmetrically. No carotid or abdominal bruits. Breast: Symmetrical. No masses. Nipples without discharge. Abdomen: Soft, non-tender, non-distended with normoactive bowel sounds. No hepatosplenomegaly or masses. No rebound/guarding. No CVA tenderness. Without hernias.  Musculoskeletal: Full range of motion and 5/5 strength throughout. Without swelling, atrophy, tenderness, crepitus, or warmth. Extremities without clubbing, cyanosis, or edema. Calves supple. Skin: Warm and moist without erythema, ecchymosis, wounds, or rash. Neuro: A+Ox3. CN II-XII grossly intact. Moves all extremities spontaneously. Full sensation throughout. Normal gait. DTR 2+ throughout upper and lower extremities. Finger to nose intact. Psych:  Responds to questions appropriately with a normal affect.   Studies: CBC, CMET, Lipid, TSH, Vitamin D all pending.  UA:   Assessment/Plan:  53 y.o. y/o  female here for CPE Annual physical exam - Plan: CBC, Lipid panel, COMPLETE METABOLIC PANEL WITH GFR, TSH  Extrinsic asthma, unspecified - Plan: albuterol (PROVENTIL HFA;VENTOLIN HFA) 108 (90 BASE) MCG/ACT inhaler, CBC  Cystic acne - Plan: doxycycline (VIBRA-TABS) 100 MG tablet, CBC  Hypertension - Plan: losartan-hydrochlorothiazide (HYZAAR) 50-12.5 MG per tablet, CBC, Lipid panel, COMPLETE METABOLIC PANEL WITH GFR, TSH    Signed, [name], MD 09/10/2013 12:00 PM      Review of Systems     Objective:   Physical Exam        Assessment & Plan:

## 2013-09-10 NOTE — Patient Instructions (Signed)

## 2013-09-10 NOTE — Progress Notes (Signed)
   Subjective:    Patient ID: Tamara [name], female    DOB: 1961-01-12, 53 y.o.   MRN: [id number]  HPI    Review of Systems  Constitutional: Positive for appetite change and fatigue.  Eyes: Positive for itching.  Respiratory: Positive for shortness of breath.   Cardiovascular: Positive for chest pain.  Gastrointestinal: Negative.   Endocrine: Positive for cold intolerance, polydipsia and polyphagia.  Genitourinary: Negative.   Musculoskeletal: Positive for arthralgias, back pain and neck pain.  Skin: Negative.   Allergic/Immunologic: Negative.   Neurological: Positive for headaches.  Hematological: Negative.   Psychiatric/Behavioral: Positive for confusion, sleep disturbance, decreased concentration and agitation. The patient is nervous/anxious and is hyperactive.        Objective:   Physical Exam        Assessment & Plan:

## 2013-09-11 DIAGNOSIS — M545 Low back pain, unspecified: Secondary | ICD-10-CM | POA: Diagnosis not present

## 2013-09-11 LAB — TSH: TSH: 0.925 u[IU]/mL (ref 0.350–4.500)

## 2013-09-14 DIAGNOSIS — M545 Low back pain, unspecified: Secondary | ICD-10-CM | POA: Diagnosis not present

## 2013-09-16 ENCOUNTER — Telehealth: Payer: Self-pay

## 2013-09-16 MED ORDER — DOXYCYCLINE MONOHYDRATE 100 MG PO CAPS: 100.0000 mg | ORAL_CAPSULE | Freq: Two times a day (BID) | ORAL | Status: DC

## 2013-09-16 NOTE — Telephone Encounter (Signed)
Pt called back and advised her doxycycline was ordered incorrectly. She had told assistant at [facility] and also Dr L that she does not take the doxy hyclate and it needed to get changed in system. Dr L had sent in doxy (Vibra-tabs) which was shipped to her and is doxy hyclate. Looking back at her prev med list and her old Rx bottles, pt needs to get the doxy monohyclate which is (Monodox) in [facility]. I will send in new Rx for this and make sure it is changed on her med list.

## 2013-09-17 DIAGNOSIS — M545 Low back pain, unspecified: Secondary | ICD-10-CM | POA: Diagnosis not present

## 2013-09-17 DIAGNOSIS — F331 Major depressive disorder, recurrent, moderate: Secondary | ICD-10-CM | POA: Diagnosis not present

## 2013-10-02 DIAGNOSIS — M545 Low back pain, unspecified: Secondary | ICD-10-CM | POA: Diagnosis not present

## 2013-10-05 DIAGNOSIS — F331 Major depressive disorder, recurrent, moderate: Secondary | ICD-10-CM | POA: Diagnosis not present

## 2013-10-05 DIAGNOSIS — M545 Low back pain, unspecified: Secondary | ICD-10-CM | POA: Diagnosis not present

## 2013-10-07 DIAGNOSIS — M545 Low back pain, unspecified: Secondary | ICD-10-CM | POA: Diagnosis not present

## 2013-10-07 DIAGNOSIS — F333 Major depressive disorder, recurrent, severe with psychotic symptoms: Secondary | ICD-10-CM | POA: Diagnosis not present

## 2013-10-13 DIAGNOSIS — M545 Low back pain, unspecified: Secondary | ICD-10-CM | POA: Diagnosis not present

## 2013-10-15 DIAGNOSIS — F331 Major depressive disorder, recurrent, moderate: Secondary | ICD-10-CM | POA: Diagnosis not present

## 2013-10-15 DIAGNOSIS — M545 Low back pain, unspecified: Secondary | ICD-10-CM | POA: Diagnosis not present

## 2013-10-21 ENCOUNTER — Encounter (HOSPITAL_COMMUNITY): Payer: Self-pay | Admitting: Radiology

## 2013-10-21 ENCOUNTER — Inpatient Hospital Stay (HOSPITAL_COMMUNITY)
Admission: EM | Admit: 2013-10-21 | Discharge: 2013-10-22 | DRG: 074 | Disposition: A | Discharge status: Home / Self Care | Payer: Medicare Other | Attending: Family Medicine | Admitting: Family Medicine

## 2013-10-21 ENCOUNTER — Emergency Department (HOSPITAL_COMMUNITY): Payer: Medicare Other

## 2013-10-21 DIAGNOSIS — E785 Hyperlipidemia, unspecified: Secondary | ICD-10-CM | POA: Diagnosis present

## 2013-10-21 DIAGNOSIS — Z800 Family history of malignant neoplasm of digestive organs: Secondary | ICD-10-CM

## 2013-10-21 DIAGNOSIS — G589 Mononeuropathy, unspecified: Secondary | ICD-10-CM | POA: Diagnosis present

## 2013-10-21 DIAGNOSIS — I10 Essential (primary) hypertension: Secondary | ICD-10-CM | POA: Diagnosis not present

## 2013-10-21 DIAGNOSIS — R930 Abnormal findings on diagnostic imaging of skull and head, not elsewhere classified: Secondary | ICD-10-CM | POA: Diagnosis not present

## 2013-10-21 DIAGNOSIS — M5412 Radiculopathy, cervical region: Secondary | ICD-10-CM | POA: Diagnosis present

## 2013-10-21 DIAGNOSIS — R209 Unspecified disturbances of skin sensation: Secondary | ICD-10-CM

## 2013-10-21 DIAGNOSIS — F3289 Other specified depressive episodes: Secondary | ICD-10-CM | POA: Diagnosis present

## 2013-10-21 DIAGNOSIS — K219 Gastro-esophageal reflux disease without esophagitis: Secondary | ICD-10-CM | POA: Diagnosis present

## 2013-10-21 DIAGNOSIS — Z806 Family history of leukemia: Secondary | ICD-10-CM | POA: Diagnosis not present

## 2013-10-21 DIAGNOSIS — R200 Anesthesia of skin: Secondary | ICD-10-CM

## 2013-10-21 DIAGNOSIS — E876 Hypokalemia: Secondary | ICD-10-CM | POA: Diagnosis present

## 2013-10-21 DIAGNOSIS — Z91041 Radiographic dye allergy status: Secondary | ICD-10-CM

## 2013-10-21 DIAGNOSIS — IMO0001 Reserved for inherently not codable concepts without codable children: Secondary | ICD-10-CM | POA: Diagnosis not present

## 2013-10-21 DIAGNOSIS — Z8249 Family history of ischemic heart disease and other diseases of the circulatory system: Secondary | ICD-10-CM | POA: Diagnosis not present

## 2013-10-21 DIAGNOSIS — IMO0002 Reserved for concepts with insufficient information to code with codable children: Secondary | ICD-10-CM | POA: Diagnosis present

## 2013-10-21 DIAGNOSIS — Z888 Allergy status to other drugs, medicaments and biological substances status: Secondary | ICD-10-CM | POA: Diagnosis not present

## 2013-10-21 DIAGNOSIS — Z882 Allergy status to sulfonamides status: Secondary | ICD-10-CM

## 2013-10-21 DIAGNOSIS — Z823 Family history of stroke: Secondary | ICD-10-CM

## 2013-10-21 DIAGNOSIS — I639 Cerebral infarction, unspecified: Secondary | ICD-10-CM | POA: Diagnosis present

## 2013-10-21 DIAGNOSIS — F411 Generalized anxiety disorder: Secondary | ICD-10-CM | POA: Diagnosis present

## 2013-10-21 DIAGNOSIS — E669 Obesity, unspecified: Secondary | ICD-10-CM | POA: Diagnosis present

## 2013-10-21 DIAGNOSIS — Z833 Family history of diabetes mellitus: Secondary | ICD-10-CM | POA: Diagnosis not present

## 2013-10-21 DIAGNOSIS — J45909 Unspecified asthma, uncomplicated: Secondary | ICD-10-CM | POA: Diagnosis present

## 2013-10-21 DIAGNOSIS — N179 Acute kidney failure, unspecified: Secondary | ICD-10-CM | POA: Diagnosis present

## 2013-10-21 DIAGNOSIS — F329 Major depressive disorder, single episode, unspecified: Secondary | ICD-10-CM | POA: Diagnosis present

## 2013-10-21 DIAGNOSIS — Z6833 Body mass index (BMI) 33.0-33.9, adult: Secondary | ICD-10-CM | POA: Diagnosis not present

## 2013-10-21 DIAGNOSIS — Z880 Allergy status to penicillin: Secondary | ICD-10-CM | POA: Diagnosis not present

## 2013-10-21 DIAGNOSIS — F419 Anxiety disorder, unspecified: Secondary | ICD-10-CM | POA: Diagnosis present

## 2013-10-21 DIAGNOSIS — F32A Depression, unspecified: Secondary | ICD-10-CM | POA: Diagnosis present

## 2013-10-21 DIAGNOSIS — L708 Other acne: Secondary | ICD-10-CM | POA: Diagnosis present

## 2013-10-21 LAB — CBG MONITORING, ED: GLUCOSE-CAPILLARY: 135 mg/dL — AB (ref 70–99)

## 2013-10-21 LAB — I-STAT CHEM 8, ED
BUN: 16 mg/dL (ref 6–23)
CALCIUM ION: 1.25 mmol/L — AB (ref 1.12–1.23)
CREATININE: 1.2 mg/dL — AB (ref 0.50–1.10)
Chloride: 101 mEq/L (ref 96–112)
Glucose, Bld: 101 mg/dL — ABNORMAL HIGH (ref 70–99)
HCT: 38 % (ref 36.0–46.0)
Hemoglobin: 12.9 g/dL (ref 12.0–15.0)
Potassium: 3.2 mEq/L — ABNORMAL LOW (ref 3.7–5.3)
Sodium: 140 mEq/L (ref 137–147)
TCO2: 26 mmol/L (ref 0–100)

## 2013-10-21 LAB — CBC
HEMATOCRIT: 38.1 % (ref 36.0–46.0)
Hemoglobin: 13 g/dL (ref 12.0–15.0)
MCH: 29.1 pg (ref 26.0–34.0)
MCHC: 34.1 g/dL (ref 30.0–36.0)
MCV: 85.2 fL (ref 78.0–100.0)
PLATELETS: 239 10*3/uL (ref 150–400)
RBC: 4.47 MIL/uL (ref 3.87–5.11)
RDW: 13.5 % (ref 11.5–15.5)
WBC: 6.6 10*3/uL (ref 4.0–10.5)

## 2013-10-21 LAB — DIFFERENTIAL
Basophils Absolute: 0.1 10*3/uL (ref 0.0–0.1)
Basophils Relative: 1 % (ref 0–1)
Eosinophils Absolute: 0.2 10*3/uL (ref 0.0–0.7)
Eosinophils Relative: 4 % (ref 0–5)
LYMPHS PCT: 48 % — AB (ref 12–46)
Lymphs Abs: 3.2 10*3/uL (ref 0.7–4.0)
Monocytes Absolute: 0.4 10*3/uL (ref 0.1–1.0)
Monocytes Relative: 6 % (ref 3–12)
NEUTROS ABS: 2.7 10*3/uL (ref 1.7–7.7)
Neutrophils Relative %: 41 % — ABNORMAL LOW (ref 43–77)

## 2013-10-21 LAB — PROTIME-INR
INR: 1 (ref 0.00–1.49)
PROTHROMBIN TIME: 13 s (ref 11.6–15.2)

## 2013-10-21 LAB — I-STAT TROPONIN, ED: Troponin i, poc: 0 ng/mL (ref 0.00–0.08)

## 2013-10-21 LAB — APTT: APTT: 32 s (ref 24–37)

## 2013-10-21 NOTE — ED Notes (Addendum)
Pt now reports sudden onset of blurred vision in L eye that just started.  Dr. Karle Starch at triage to assess pt and Code Stroke activated.  Pt now reports LKW at 7:30pm.

## 2013-10-21 NOTE — Consult Note (Signed)
Referring Physician: Kathrynn Humble    Chief Complaint: Left sided numbness  HPI: Tamara [name] is an 53 y.o. female who reports that she has been having back pain for the past 2 days.  Was at home with her significant other this evening and on going to the bathroom noted numbness in her left foot.  Over time the numbness traveled to the arm and the left side of the face.  There is also some numbness on the right cheek as well.  With movement of numbness to the face the patient had her significant other drive her to the ED.  Code stroke was called from triage.  Initial NIHSS of 2  Date last known well: Date: 10/21/2013 Time last known well: Time: 21:00 tPA Given: No: Minimal symptoms  Past Medical History  Diagnosis Date  . Depression   . HTN (hypertension)   . Hyperlipemia   . Allergy   . GERD (gastroesophageal reflux disease)   . Neuromuscular disorder   . Anemia   . Anxiety   . Asthma   . Shock therapy as cause of abnormal reaction of patient or of later complication without mention of misadventure at time of procedure   . History of short term memory loss     Past Surgical History  Procedure Laterality Date  . Cholecystectomy    . Abdominal hysterectomy      partial  . Colonoscopy      Family History  Problem Relation Age of Onset  . Arrhythmia Father   . Stroke Father   . Hyperlipidemia Father   . Heart disease Father   . Hypertension Father   . Coronary artery disease Maternal Grandfather 40    Died 58  . Heart disease Maternal Grandfather   . Hypertension Maternal Grandfather   . Cancer Mother   . Hyperlipidemia Mother   . Hypertension Mother   . Hearing loss Paternal Grandmother   . Hearing loss Paternal Grandfather   . Diabetes Paternal Grandfather   . Diabetes Daughter   . Colon cancer Paternal Uncle   . Hyperlipidemia Brother   . Hyperlipidemia Maternal Grandmother    Social History:  reports that she has never smoked. She has never used smokeless  tobacco. She reports that she does not drink alcohol or use illicit drugs.  Allergies:  Allergies  Allergen Reactions  . Adhesive [Tape]     This is tape as well as adhesive on bandaids.  . Gabapentin     Unknown reaction  . Ivp Dye [Iodinated Diagnostic Agents]   . Propoxyphene Hcl Other (See Comments)    hallucination  . Seroquel [Quetiapine Fumerate]     Unknown reaction  . Penicillins Rash  . Sulfonamide Derivatives Rash    Medications: I have reviewed the patient's current medications. Prior to Admission:  No current facility-administered medications for this encounter. Current outpatient prescriptions: albuterol (PROVENTIL HFA;VENTOLIN HFA) 108 (90 BASE) MCG/ACT inhaler, Inhale 2 puffs into the lungs every 6 (six) hours as needed for wheezing or shortness of breath., Disp: 1 Inhaler, Rfl: 3;   ALPRAZolam (XANAX) 1 MG tablet, Take 1 mg by mouth continuous as needed., Disp: , Rfl: ;   ALPRAZOLAM XR 1 MG 24 hr tablet, Take 3 mg by mouth at bedtime. , Disp: , Rfl:  aspirin 81 MG tablet, Take 81 mg by mouth daily., Disp: , Rfl: ;   dexlansoprazole (DEXILANT) 60 MG capsule, Take 1 capsule (60 mg total) by mouth daily., Disp: 30 capsule, Rfl: 0;  dexlansoprazole (DEXILANT) 60 MG capsule, Take 1 capsule (60 mg total) by mouth daily., Disp: 90 capsule, Rfl: 3;   diclofenac sodium (VOLTAREN) 1 % GEL, Apply 2 g topically as needed., Disp: 100 g, Rfl: 3 doxycycline (MONODOX) 100 MG capsule, Take 1 capsule (100 mg total) by mouth 2 (two) times daily., Disp: 180 capsule, Rfl: 3;  hydrocortisone (PROCTOSOL HC) 2.5 % rectal cream, Place rectally 2 (two) times daily., Disp: 30 g, Rfl: 5;  losartan-hydrochlorothiazide (HYZAAR) 50-12.5 MG per tablet, Take 1 tablet by mouth daily., Disp: 90 tablet, Rfl: 3 niacin (NIASPAN) 500 MG CR tablet, Take 1 tablet (500 mg total) by mouth at bedtime., Disp: 28 tablet, Rfl: 0;  NON FORMULARY, Take 4 mg by mouth as needed.  Trilofan 4 mg, Disp: , Rfl: ;    Oxcarbazepine (TRILEPTAL) 300 MG tablet, Take 300 mg by mouth daily. , Disp: , Rfl: ;  temazepam (RESTORIL) 15 MG capsule, Take 45 mg by mouth at bedtime as needed. , Disp: , Rfl: ;   Vilazodone HCl (VIIBRYD) 20 MG TABS, Take by mouth daily., Disp: , Rfl:   ROS: History obtained from the patient  General ROS: negative for - chills, fatigue, fever, night sweats, weight gain or weight loss Psychological ROS: negative for - behavioral disorder, hallucinations, memory difficulties, mood swings or suicidal ideation Ophthalmic ROS: negative for - blurry vision, double vision, eye pain or loss of vision ENT ROS: negative for - epistaxis, nasal discharge, oral lesions, sore throat, tinnitus or vertigo Allergy and Immunology ROS: negative for - hives or itchy/watery eyes Hematological and Lymphatic ROS: negative for - bleeding problems, bruising or swollen lymph nodes Endocrine ROS: negative for - galactorrhea, hair pattern changes, polydipsia/polyuria or temperature intolerance Respiratory ROS: negative for - cough, hemoptysis, shortness of breath or wheezing Cardiovascular ROS: negative for - chest pain, dyspnea on exertion, edema or irregular heartbeat Gastrointestinal ROS: negative for - abdominal pain, diarrhea, hematemesis, nausea/vomiting or stool incontinence Genito-Urinary ROS: negative for - dysuria, hematuria, incontinence or urinary frequency/urgency Musculoskeletal ROS: back pain Neurological ROS: as noted in HPI Dermatological ROS: negative for rash and skin lesion changes  Physical Examination: Temperature 98 F (36.7 C).  Neurologic Examination: Mental Status: Alert, oriented but could not tell me the month, thought content appropriate.  Speech fluent without evidence of aphasia.  Able to follow 3 step commands without difficulty. Cranial Nerves: II: Discs flat bilaterally; Visual fields grossly normal, pupils equal, round, reactive to light and accommodation III,IV, VI:  ptosis not present, extra-ocular motions intact bilaterally V,VII: smile symmetric, facial light touch sensation decreased on the left and in the right cheek VIII: hearing normal bilaterally IX,X: gag reflex present XI: bilateral shoulder shrug XII: midline tongue extension Motor: Right : Upper extremity   5/5    Left:     Upper extremity   5/5  Lower extremity   5/5     Lower extremity   5/5 Tone and bulk:normal tone throughout; no atrophy noted Sensory: Pinprick and light touch decreased on the left upper and lower extremity Deep Tendon Reflexes: 2+ and symmetric throughout Plantars: Right: downgoing   Left: downgoing Cerebellar: normal finger-to-nose and normal heel-to-shin test Gait: Unable to test CV: pulses palpable throughout     Laboratory Studies:  Basic Metabolic Panel:  Recent Labs Lab 10/21/13 2325  NA 140  K 3.2*  CL 101  GLUCOSE 101*  BUN 16  CREATININE 1.20*    Liver Function Tests: No results found for this basename: AST,  ALT, ALKPHOS, BILITOT, PROT, ALBUMIN,  in the last 168 hours No results found for this basename: LIPASE, AMYLASE,  in the last 168 hours No results found for this basename: AMMONIA,  in the last 168 hours  CBC:  Recent Labs Lab 10/21/13 2325  HGB 12.9  HCT 38.0    Cardiac Enzymes: No results found for this basename: CKTOTAL, CKMB, CKMBINDEX, TROPONINI,  in the last 168 hours  BNP: No components found with this basename: POCBNP,   CBG:  Recent Labs Lab 10/21/13 2317  GLUCAP 135*    Microbiology: Results for orders placed in visit on 04/10/12  URINE CULTURE     Status: None   Collection Time    04/10/12  1:10 PM      Result Value Ref Range Status   Colony Count NO GROWTH   Final   Organism ID, Bacteria NO GROWTH   Final    Coagulation Studies: No results found for this basename: LABPROT, INR,  in the last 72 hours  Urinalysis: No results found for this basename: COLORURINE, APPERANCEUR, LABSPEC, PHURINE,  GLUCOSEU, HGBUR, BILIRUBINUR, KETONESUR, PROTEINUR, UROBILINOGEN, NITRITE, LEUKOCYTESUR,  in the last 168 hours  Lipid Panel:    Component Value Date/Time   CHOL 277* 09/10/2013 1215   TRIG 324* 09/10/2013 1215   HDL 47 09/10/2013 1215   CHOLHDL 5.9 09/10/2013 1215   VLDL 65* 09/10/2013 1215   LDLCALC 165* 09/10/2013 1215    HgbA1C:  Lab Results  Component Value Date   HGBA1C 4.8 10/30/2011    Urine Drug Screen:   No results found for this basename: labopia,  cocainscrnur,  labbenz,  amphetmu,  thcu,  labbarb    Alcohol Level: No results found for this basename: ETH,  in the last 168 hours  Other results: EKG: sinus rhythm at 78 bpm.  Imaging: Ct Head (brain) Wo Contrast  10/21/2013   CLINICAL DATA:  Code stroke  EXAM: CT HEAD WITHOUT CONTRAST  TECHNIQUE: Contiguous axial images were obtained from the base of the skull through the vertex without intravenous contrast.  COMPARISON:  US SOFT TISSUE HEAD/NECK dated 06/02/2013; MR HEAD W/O CM dated 09/28/2010; CT HEAD W/O CM dated 05/07/2006  FINDINGS: No mass effect, midline shift, or acute intracranial hemorrhage. Brain parenchyma and ventricular system are unremarkable.  IMPRESSION: No acute intracranial pathology.   Electronically Signed   By: Maryclare Bean M.D.   On: 10/21/2013 23:09    Assessment: 53 y.o. female presenting with acute onset left sided numbness.  No weakness involved.  Head CT reviewed and shows no acute changes.  Symptoms minimal at this time and not affecting function.  Patient with stroke risk factors.  Further work up recommended.    Stroke Risk Factors - hyperlipidemia and hypertension  Plan: 1. HgbA1c, fasting lipid panel 2. MRI, MRA  of the brain without contrast 3. PT consult, OT consult, Speech consult 4. Echocardiogram 5. Carotid dopplers 6. Prophylactic therapy-Antiplatelet med: Aspirin - dose 325mg  daily 7. Risk factor modification 8. Telemetry monitoring 9. Frequent neuro checks  Case discussed with Dr.  Arlester Marker, MD [facility] [phone] 10/21/2013, 11:29 PM

## 2013-10-21 NOTE — ED Notes (Addendum)
Pt reports back pain for past couple of days.  Tonight around 9pm she started having numbness and tingling to L side of head, L face, R side of face, nose, L arm, and L leg. Pt states nose is completely numb.  No neuro deficits noted on triage exam.  Sensation intact. Dr. Karle Starch notified of pt and no code stroke activated at this time per Dr. Karle Starch.

## 2013-10-21 NOTE — ED Notes (Signed)
Pt reports left sided face, arm, and leg numbness and tingling that started tonight around 2100, also tingling at the top of her head. No facial droop. Pt ambulatory. Speaking clear, complete sentences at this time. No other neuro deficits noted at this time.

## 2013-10-22 ENCOUNTER — Inpatient Hospital Stay (HOSPITAL_COMMUNITY): Payer: Medicare Other

## 2013-10-22 DIAGNOSIS — I639 Cerebral infarction, unspecified: Secondary | ICD-10-CM | POA: Diagnosis present

## 2013-10-22 DIAGNOSIS — R209 Unspecified disturbances of skin sensation: Secondary | ICD-10-CM | POA: Diagnosis not present

## 2013-10-22 DIAGNOSIS — IMO0001 Reserved for inherently not codable concepts without codable children: Secondary | ICD-10-CM | POA: Diagnosis not present

## 2013-10-22 DIAGNOSIS — I10 Essential (primary) hypertension: Secondary | ICD-10-CM | POA: Diagnosis not present

## 2013-10-22 LAB — BASIC METABOLIC PANEL
BUN: 15 mg/dL (ref 6–23)
CHLORIDE: 102 meq/L (ref 96–112)
CO2: 23 mEq/L (ref 19–32)
Calcium: 9.4 mg/dL (ref 8.4–10.5)
Creatinine, Ser: 0.81 mg/dL (ref 0.50–1.10)
GFR calc Af Amer: >90 mL/min (ref >90)
GFR calc non Af Amer: 83 mL/min — ABNORMAL LOW (ref >90)
GLUCOSE: 92 mg/dL (ref 70–99)
Potassium: 3.7 mEq/L (ref 3.7–5.3)
Sodium: 140 mEq/L (ref 137–147)

## 2013-10-22 LAB — CBC
HEMATOCRIT: 35.6 % — AB (ref 36.0–46.0)
HEMOGLOBIN: 12.4 g/dL (ref 12.0–15.0)
MCH: 29 pg (ref 26.0–34.0)
MCHC: 34.8 g/dL (ref 30.0–36.0)
MCV: 83.4 fL (ref 78.0–100.0)
Platelets: 221 10*3/uL (ref 150–400)
RBC: 4.27 MIL/uL (ref 3.87–5.11)
RDW: 13.4 % (ref 11.5–15.5)
WBC: 8.3 10*3/uL (ref 4.0–10.5)

## 2013-10-22 LAB — HEMOGLOBIN A1C
Hgb A1c MFr Bld: 4.9 % (ref <5.7)
Mean Plasma Glucose: 94 mg/dL (ref <117)

## 2013-10-22 LAB — COMPREHENSIVE METABOLIC PANEL
ALK PHOS: 94 U/L (ref 39–117)
ALT: 18 U/L (ref 0–35)
AST: 19 U/L (ref 0–37)
Albumin: 4 g/dL (ref 3.5–5.2)
BILIRUBIN TOTAL: 0.4 mg/dL (ref 0.3–1.2)
BUN: 17 mg/dL (ref 6–23)
CHLORIDE: 101 meq/L (ref 96–112)
CO2: 25 meq/L (ref 19–32)
Calcium: 9.7 mg/dL (ref 8.4–10.5)
Creatinine, Ser: 1.06 mg/dL (ref 0.50–1.10)
GFR calc Af Amer: 69 mL/min — ABNORMAL LOW (ref >90)
GFR, EST NON AFRICAN AMERICAN: 60 mL/min — AB (ref >90)
Glucose, Bld: 100 mg/dL — ABNORMAL HIGH (ref 70–99)
Potassium: 3.3 mEq/L — ABNORMAL LOW (ref 3.7–5.3)
SODIUM: 140 meq/L (ref 137–147)
Total Protein: 7.3 g/dL (ref 6.0–8.3)

## 2013-10-22 MED ORDER — ALPRAZOLAM 0.5 MG PO TABS
0.5000 mg | ORAL_TABLET | Freq: Every day | ORAL | Status: DC | PRN
  Administered 2013-10-22: 0.5 mg via ORAL

## 2013-10-22 MED ORDER — OXCARBAZEPINE 300 MG PO TABS: 900.0000 mg | ORAL_TABLET | Freq: Every day | ORAL | Status: DC

## 2013-10-22 MED ORDER — EPINEPHRINE 0.3 MG/0.3ML IJ SOAJ
0.3000 mg | Freq: Once | INTRAMUSCULAR | Status: AC
  Administered 2013-10-22: 0.3 mg via INTRAMUSCULAR
  Filled 2013-10-22: qty 0.3

## 2013-10-22 MED ORDER — ASPIRIN 325 MG PO TABS: 325.0000 mg | ORAL_TABLET | Freq: Every day | ORAL | Status: DC

## 2013-10-22 MED ORDER — DEXLANSOPRAZOLE 60 MG PO CPDR
60.0000 mg | DELAYED_RELEASE_CAPSULE | Freq: Every day | ORAL | Status: DC
  Administered 2013-10-22: 60 mg via ORAL
  Filled 2013-10-22: qty 1

## 2013-10-22 MED ORDER — DOXYCYCLINE HYCLATE 100 MG PO TABS
100.0000 mg | ORAL_TABLET | Freq: Every day | ORAL | Status: DC
  Administered 2013-10-22: 100 mg via ORAL
  Filled 2013-10-22: qty 1

## 2013-10-22 MED ORDER — SENNOSIDES-DOCUSATE SODIUM 8.6-50 MG PO TABS
1.0000 | ORAL_TABLET | Freq: Every evening | ORAL | Status: DC | PRN
  Filled 2013-10-22: qty 1

## 2013-10-22 MED ORDER — ALPRAZOLAM ER 1 MG PO TB24: 3.0000 mg | ORAL_TABLET | Freq: Every day | ORAL | Status: DC

## 2013-10-22 MED ORDER — OXCARBAZEPINE 300 MG PO TABS
300.0000 mg | ORAL_TABLET | Freq: Every day | ORAL | Status: DC
  Administered 2013-10-22: 300 mg via ORAL
  Filled 2013-10-22 (×2): qty 1

## 2013-10-22 MED ORDER — POTASSIUM CHLORIDE CRYS ER 20 MEQ PO TBCR
40.0000 meq | EXTENDED_RELEASE_TABLET | Freq: Two times a day (BID) | ORAL | Status: AC
  Administered 2013-10-22 (×2): 40 meq via ORAL
  Filled 2013-10-22 (×2): qty 2

## 2013-10-22 MED ORDER — ALPRAZOLAM 0.5 MG PO TABS
1.0000 mg | ORAL_TABLET | Freq: Three times a day (TID) | ORAL | Status: DC
  Filled 2013-10-22: qty 2

## 2013-10-22 MED ORDER — ALPRAZOLAM ER 1 MG PO TB24
3.0000 mg | ORAL_TABLET | Freq: Every day | ORAL | Status: DC
  Administered 2013-10-22: 3 mg via ORAL
  Filled 2013-10-22: qty 3

## 2013-10-22 MED ORDER — IPRATROPIUM-ALBUTEROL 0.5-2.5 (3) MG/3ML IN SOLN
3.0000 mL | Freq: Once | RESPIRATORY_TRACT | Status: AC
  Administered 2013-10-22: 3 mL via RESPIRATORY_TRACT
  Filled 2013-10-22: qty 3

## 2013-10-22 MED ORDER — PANTOPRAZOLE SODIUM 40 MG PO TBEC: 40.0000 mg | DELAYED_RELEASE_TABLET | Freq: Every day | ORAL | Status: DC

## 2013-10-22 MED ORDER — ACETAMINOPHEN 325 MG PO TABS
650.0000 mg | ORAL_TABLET | Freq: Four times a day (QID) | ORAL | Status: DC | PRN
  Administered 2013-10-22: 650 mg via ORAL
  Filled 2013-10-22: qty 2

## 2013-10-22 MED ORDER — STROKE: EARLY STAGES OF RECOVERY BOOK
Freq: Once | Status: AC
  Administered 2013-10-22: 03:00:00
  Filled 2013-10-22: qty 1

## 2013-10-22 MED ORDER — ASPIRIN 325 MG PO TABS
325.0000 mg | ORAL_TABLET | Freq: Every day | ORAL | Status: DC
  Filled 2013-10-22: qty 1

## 2013-10-22 MED ORDER — ATORVASTATIN CALCIUM 40 MG PO TABS
40.0000 mg | ORAL_TABLET | Freq: Every day | ORAL | Status: DC
  Filled 2013-10-22: qty 1

## 2013-10-22 MED ORDER — ENOXAPARIN SODIUM 40 MG/0.4ML ~~LOC~~ SOLN
40.0000 mg | Freq: Every day | SUBCUTANEOUS | Status: DC
Start: 2013-10-22 — End: 2013-10-22
  Filled 2013-10-22: qty 0.4

## 2013-10-22 MED ORDER — VILAZODONE HCL 20 MG PO TABS
1.0000 | ORAL_TABLET | Freq: Every day | ORAL | Status: DC
  Administered 2013-10-22: 20 mg via ORAL
  Filled 2013-10-22 (×2): qty 1

## 2013-10-22 MED ORDER — TEMAZEPAM 15 MG PO CAPS
45.0000 mg | ORAL_CAPSULE | Freq: Every day | ORAL | Status: DC
  Administered 2013-10-22: 45 mg via ORAL
  Filled 2013-10-22: qty 3

## 2013-10-22 MED ORDER — EPINEPHRINE 0.3 MG/0.3ML IJ SOAJ: 0.3000 mg | INTRAMUSCULAR | Status: DC | PRN

## 2013-10-22 MED ORDER — ALBUTEROL SULFATE (2.5 MG/3ML) 0.083% IN NEBU: 2.5000 mg | INHALATION_SOLUTION | Freq: Four times a day (QID) | RESPIRATORY_TRACT | Status: DC | PRN

## 2013-10-22 MED ORDER — ATORVASTATIN CALCIUM 40 MG PO TABS: 40.0000 mg | ORAL_TABLET | Freq: Every day | ORAL | Status: DC

## 2013-10-22 MED ORDER — OXCARBAZEPINE 300 MG PO TABS
600.0000 mg | ORAL_TABLET | Freq: Every day | ORAL | Status: DC
  Administered 2013-10-22: 600 mg via ORAL
  Filled 2013-10-22: qty 2

## 2013-10-22 NOTE — ED Notes (Signed)
Dr. Sonnenberg at bedside. 

## 2013-10-22 NOTE — ED Notes (Signed)
Admitting physician at bedside

## 2013-10-22 NOTE — Progress Notes (Signed)
NEURO HOSPITALIST PROGRESS NOTE   SUBJECTIVE:                                                                                                                        Patient is feeling better, left arm and leg still feel "funny but not numb".   OBJECTIVE:                                                                                                                           Vital signs in last 24 hours: Temp:  [97.5 F (36.4 C)-98 F (36.7 C)] 97.5 F (36.4 C) (04/23 0212) Pulse Rate:  [73-92] 91 (04/23 0611) Resp:  [16-19] 16 (04/23 0611) BP: (106-152)/(61-102) 119/67 mmHg (04/23 0611) SpO2:  [98 %-100 %] 100 % (04/23 0611) Weight:  [82.101 kg (181 lb)] 82.101 kg (181 lb) (04/23 0212)  Intake/Output from previous day: 04/22 0701 - 04/23 0700 In: 240 [P.O.:240] Out: -  Intake/Output this shift: Total I/O In: 260 [P.O.:260] Out: -  Nutritional status: General  Past Medical History  Diagnosis Date  . Depression   . HTN (hypertension)   . Hyperlipemia   . Allergy   . GERD (gastroesophageal reflux disease)   . Neuromuscular disorder   . Anemia   . Anxiety   . Asthma   . Shock therapy as cause of abnormal reaction of patient or of later complication without mention of misadventure at time of procedure   . History of short term memory loss      Neurologic Exam:  Mental Status: Alert, oriented, thought content appropriate.  Speech fluent without evidence of aphasia.  Able to follow 3 step commands without difficulty. Cranial Nerves: II: Discs flat bilaterally; Visual fields grossly normal, pupils equal, round, reactive to light and accommodation III,IV, VI: ptosis not present, extra-ocular motions intact bilaterally V,VII: smile symmetric, facial light touch sensation decreased bilaterally around the lips VIII: hearing normal bilaterally IX,X: gag reflex present XI: bilateral shoulder shrug XII: midline tongue extension without  atrophy or fasciculations  Motor: Right : Upper extremity   5/5    Left:     Upper extremity   5/5  Lower extremity   5/5     Lower extremity  5/5 Tone and bulk:normal tone throughout; no atrophy noted Sensory: Pinprick and light touch decreased on the the left arm and leg.  Deep Tendon Reflexes:  Right: Upper Extremity   Left: Upper extremity   biceps (C-5 to C-6) 2/4   biceps (C-5 to C-6) 2/4 tricep (C7) 2/4    triceps (C7) 2/4 Brachioradialis (C6) 2/4  Brachioradialis (C6) 2/4  Lower Extremity Lower Extremity  quadriceps (L-2 to L-4) 2/4   quadriceps (L-2 to L-4) 2/4 Achilles (S1) 2/4   Achilles (S1) 2/4  Plantars: Right: downgoing   Left: downgoing   Lab Results: Basic Metabolic Panel:  Recent Labs Lab 10/21/13 2313 10/21/13 2325 10/22/13 0528  NA 140 140 140  K 3.3* 3.2* 3.7  CL 101 101 102  CO2 25  --  23  GLUCOSE 100* 101* 92  BUN 17 16 15   CREATININE 1.06 1.20* 0.81  CALCIUM 9.7  --  9.4    Liver Function Tests:  Recent Labs Lab 10/21/13 2313  AST 19  ALT 18  ALKPHOS 94  BILITOT 0.4  PROT 7.3  ALBUMIN 4.0   No results found for this basename: LIPASE, AMYLASE,  in the last 168 hours No results found for this basename: AMMONIA,  in the last 168 hours  CBC:  Recent Labs Lab 10/21/13 2313 10/21/13 2325 10/22/13 0528  WBC 6.6  --  8.3  NEUTROABS 2.7  --   --   HGB 13.0 12.9 12.4  HCT 38.1 38.0 35.6*  MCV 85.2  --  83.4  PLT 239  --  221    Cardiac Enzymes: No results found for this basename: CKTOTAL, CKMB, CKMBINDEX, TROPONINI,  in the last 168 hours  Lipid Panel: No results found for this basename: CHOL, TRIG, HDL, CHOLHDL, VLDL, LDLCALC,  in the last 168 hours  CBG:  Recent Labs Lab 10/21/13 2317  [place] 135*    Microbiology: Results for orders placed in visit on 04/10/12  URINE CULTURE     Status: None   Collection Time    04/10/12  1:10 PM      Result Value Ref Range Status   Colony Count NO GROWTH   Final   Organism  ID, Bacteria NO GROWTH   Final    Coagulation Studies:  Recent Labs  10/21/13 2313  LABPROT 13.0  INR 1.00    Imaging: Ct Head (brain) Wo Contrast  10/21/2013   CLINICAL DATA:  Code stroke  EXAM: CT HEAD WITHOUT CONTRAST  TECHNIQUE: Contiguous axial images were obtained from the base of the skull through the vertex without intravenous contrast.  COMPARISON:  US SOFT TISSUE HEAD/NECK dated 06/02/2013; MR HEAD W/O CM dated 09/28/2010; CT HEAD W/O CM dated 05/07/2006  FINDINGS: No mass effect, midline shift, or acute intracranial hemorrhage. Brain parenchyma and ventricular system are unremarkable.  IMPRESSION: No acute intracranial pathology.   Electronically Signed   By: Maryclare Bean M.D.   On: 10/21/2013 23:09   Mr Brain Wo Contrast  10/22/2013   CLINICAL DATA:  Acute onset of numbness involving the left lower and upper extremities as well as left greater than right face. Evaluate for stroke.  EXAM: MRI HEAD WITHOUT CONTRAST  MRA HEAD WITHOUT CONTRAST  TECHNIQUE: Multiplanar, multiecho pulse sequences of the brain and surrounding structures were obtained without intravenous contrast. Angiographic images of the head were obtained using MRA technique without contrast.  COMPARISON:  Head CT 10/21/2013 and MRI 09/28/2010  FINDINGS: MRI HEAD FINDINGS  There is no  acute infarct. Ventricles and sulci are normal for age. There is no evidence of intracranial hemorrhage, mass, midline shift, or extra-axial fluid collection. No brain parenchymal signal abnormality is identified.  Orbits are unremarkable. Paranasal sinuses are clear. There is a small left mastoid effusion. Major intracranial vascular flow voids are preserved. Calvarium and scalp soft tissues are unremarkable.  MRA HEAD FINDINGS  The visualized distal vertebral arteries are patent with the right being dominant. The left vertebral artery is hypoplastic and ends in PICA. Right PICA origin is patent. Right AICA is dominant. Basilar artery is patent  without stenosis. SCA origins are patent. PCAs are unremarkable. There is a patent left posterior communicating artery.  The internal carotid arteries are patent from skullbase to carotid termini. ACAs and MCAs are unremarkable. No intracranial aneurysm is identified.  IMPRESSION: 1. Unremarkable appearance of the brain.  No evidence of infarct. 2. Unremarkable head MRA.   Electronically Signed   By: Logan Bores   On: 10/22/2013 08:48   Mr Jodene Nam Head/brain Wo Cm  10/22/2013   CLINICAL DATA:  Acute onset of numbness involving the left lower and upper extremities as well as left greater than right face. Evaluate for stroke.  EXAM: MRI HEAD WITHOUT CONTRAST  MRA HEAD WITHOUT CONTRAST  TECHNIQUE: Multiplanar, multiecho pulse sequences of the brain and surrounding structures were obtained without intravenous contrast. Angiographic images of the head were obtained using MRA technique without contrast.  COMPARISON:  Head CT 10/21/2013 and MRI 09/28/2010  FINDINGS: MRI HEAD FINDINGS  There is no acute infarct. Ventricles and sulci are normal for age. There is no evidence of intracranial hemorrhage, mass, midline shift, or extra-axial fluid collection. No brain parenchymal signal abnormality is identified.  Orbits are unremarkable. Paranasal sinuses are clear. There is a small left mastoid effusion. Major intracranial vascular flow voids are preserved. Calvarium and scalp soft tissues are unremarkable.  MRA HEAD FINDINGS  The visualized distal vertebral arteries are patent with the right being dominant. The left vertebral artery is hypoplastic and ends in PICA. Right PICA origin is patent. Right AICA is dominant. Basilar artery is patent without stenosis. SCA origins are patent. PCAs are unremarkable. There is a patent left posterior communicating artery.  The internal carotid arteries are patent from skullbase to carotid termini. ACAs and MCAs are unremarkable. No intracranial aneurysm is identified.  IMPRESSION: 1.  Unremarkable appearance of the brain.  No evidence of infarct. 2. Unremarkable head MRA.   Electronically Signed   By: Logan Bores   On: 10/22/2013 08:48    A1c pending Recent LDL 165 Echo reading pending Carotid doppler pending  MEDICATIONS                                                                                                                        Scheduled: . ALPRAZolam  3 mg Oral QHS  . dexlansoprazole  60 mg Oral Q0600  . doxycycline  100 mg Oral Daily  . enoxaparin (LOVENOX) injection  40 mg Subcutaneous Daily  . Oxcarbazepine  300 mg Oral QHS  . OXcarbazepine  600 mg Oral Daily  . potassium chloride  40 mEq Oral BID  . temazepam  45 mg Oral QHS  . Vilazodone HCl  1 tablet Oral Daily    ASSESSMENT/PLAN:                                                                                                            53 YO female presenting with acute left sided numbness.  MRI /MRA negative for CVA. patient continues to have "funny feeling around her mouth and on the left arm and leg".  Long discussion was had with patient about TIA and need to lower her cholesterol.     Plan:  1. HgbA1c, fasting lipid panel  4. Echocardiogram  5. Carotid dopplers  6. Prophylactic therapy-Antiplatelet med: Aspirin - dose 325mg  daily  7. Risk factor modification  8. Telemetry monitoring  9. Frequent neuro checks  If Echo and Dopplers negative would do no further neurological diagnostic testing and neurology will S/O  Assessment and plan discussed with with attending physician and they are in agreement.    Etta Quill PA-C [facility] [phone]  10/22/2013, 10:01 AM   Patient seen and examined together with physician assistant and I concur with the assessment and plan.  Dorian Pod, MD

## 2013-10-22 NOTE — H&P (Signed)
[facility] Admission History and Physical Service Pager: [phone]  Patient name: Tamara [name] Medical record number: [id number] Date of birth: 24-Jun-1961 Age: 53 y.o. Gender: female  Primary Care Provider: [name], MD Consultants: neuro Code Status: full  Chief Complaint: left sided numbness  Assessment and Plan: Tamara [name] is a 53 y.o. female presenting with left sided numbness. PMH is significant for anxiety, depression, HTN, HLD, GERD.  # Left sided numbness: concern would be that this is related to TIA vs stroke, though this would be an odd presentation for this with numbness spanning both sides of the face and only affecting the outer edge of her leg. Given psychiatric history, must consider this as a contributing factor in the setting of recent stressors. Consider conversion disorder. Also consider neuropathy. -admit to tele, Attending Dr Andria Frames -cardiac monitoring -neuro checks -start aspirin -already had recent lipid panel that revealed LDL 167 -A1c, MRI/MRA, echo, carotid dopplers -PT/OT/SLP -if stroke ruled out will need close f/u with psychiatrist -given recent LDL will likely need to be on a statin  # Sensation of tongue swelling: no apparent swelling on evaluation, though note patient was s/p epipen and sensation had resolved. Concern would be for an anaphylactic reaction vs angioedema. At this time there is no mucosal swelling of the mouth or oropharynx to indicate angioedema, note patient on an ARB, not an ACEI. Anaphylactic reaction is possible, though no noted new exposures. Must also consider psychologic component as well. -will monitor for recurrence -epi-pen ordered prn -would hold off on treatment with H2 blocker or steroids at this time as this symptom has resolved, consider if there is a recurrence  # Abdominal pain: noted in LLQ with minimal tenderness to palpation on exam. No masses palpated. No blood in stool.  No symptoms to indicate specific cause.  -will continue to monitor -f/u cmet -consider UA if not improving, though no symptoms -discuss colonoscopy for cancer screening  # AKI: Cr 1.2 on admission. Baseline appears to be 1. -will trend Cr -allow PO intake  # Hypokalemia: 3.2 on admission -replete with kdur 40 mEq x2 -monitor BMET  # Psych: extensive anxiety and depression history. Previous ECT with associated memory deficits. On multiple medications for this issue. -will continue home xanax, trileptal, and restoril, and vilazodone -will need close f/u with outpatient psych  # Asthma: no wheezes on exam. -continue home prn albuterol  # Cystic acne: continue home doxycycline 1 tab daily  # GERD: protonix daily.  # HTN: in setting of potential TIA/stroke, will allow permissive HTN. Will additionally hold ARB given potential swelling of tongue. -hold home BP medications  FEN/GI: regular diet Prophylaxis: lovenox  Disposition: admit to tele, discharge pending completion of work-up  History of Present Illness: Tamara [name] is a 53 y.o. female presenting with left sided numbness.  Patient notes this started this past evening. She was on the computer and when she got up she had numbness in her left foot and leg. She walked around and they remained numb. She noted numbness in her left arm and the left portion of her face. Initially the numbness in her face was in V2 and V3 on the left, though spread to V1 on the left and then to V2 and V3 on the right. With this radiation she had a headache. She denies any weakness with this. She notes now the tingling is improved, though the numbness remains. She has not history of a stroke. She notes a  family history of MI. She has a history of HLD for which she takes tumeric, "lots of it."   After initial evaluation in the ED she noted a sensation of the back of her tongue and nose swelling. She was given an epipen injection and duoneb treatment  with subjective improvement. EDP note states "felt that her mouth was swelling and had some trouble with breathing. She had swallow eval - base of the tongue appears may be edematous - rest of the airway and lung assessment normal." She has had that happen previously with IVP contrast, though notes no other previous causes. She recently had her trileptal dose increased, though states this was a dose she was previously on. She is currently on losartan for HTN and states she has been on this for years. States she has not ever been on an ACEI. No recent changes in diet and no noted food allergies.  Patient does endorse significant recent stressors in the form of her father having a cardiac arrest and being on life support and her mother being diagnosed "with bone marrow cancer." All in the past year. She has an extensive psychiatric history and is treated by a psychiatrist for anxiety and depression. She has previously had ECT and has memory deficits related to that treatment.  Patient also endorses LLQ abdominal pain. This has been present for 3-4 weeks. Has been sharp on occasion (2-3 days), but is mostly a dull ache. Is intermittent. Does not radiate. Had diarrhea earlier this week, but with memory issues has no clue when last BM other than that was. Denies bloody BM, dysuria, and vaginal discharge. Notes history of ovarian cysts. Note patient brought this issue up after completion of the abdominal exam.  On arrival to the ED a code stroke was called and neuro evaluated the patient. Not a candidate for tpa. CT head revealed no abnormalities.  Review Of Systems: Per HPI with the following additions: none Otherwise 12 point review of systems was performed and was unremarkable.  Patient Active Problem List   Diagnosis Date Noted  . Stroke 10/22/2013  . Disturbance of skin sensation 10/21/2013  . Abnormal EKG 08/31/2011  . ASTHMA 08/01/2010  . HEMORRHOIDS-EXTERNAL 05/30/2010  . ALLERGIC RHINITIS  04/14/2008  . HYPERLIPIDEMIA 09/17/2007  . OBESITY 09/17/2007  . ANXIETY 09/17/2007  . DEPRESSION 09/17/2007  . HYPERTENSION 09/17/2007  . GERD 09/17/2007  . ACNE, MILD 09/17/2007   Past Medical History: Past Medical History  Diagnosis Date  . Depression   . HTN (hypertension)   . Hyperlipemia   . Allergy   . GERD (gastroesophageal reflux disease)   . Neuromuscular disorder   . Anemia   . Anxiety   . Asthma   . Shock therapy as cause of abnormal reaction of patient or of later complication without mention of misadventure at time of procedure   . History of short term memory loss    Past Surgical History: Past Surgical History  Procedure Laterality Date  . Cholecystectomy    . Abdominal hysterectomy      partial  . Colonoscopy     Social History: History  Substance Use Topics  . Smoking status: Never Smoker   . Smokeless tobacco: Never Used  . Alcohol Use: No   Additional social history: none  Please also refer to relevant sections of EMR.  Family History: Family History  Problem Relation Age of Onset  . Arrhythmia Father   . Stroke Father   . Hyperlipidemia Father   .  Heart disease Father   . Hypertension Father   . Coronary artery disease Maternal Grandfather 40    Died 58  . Heart disease Maternal Grandfather   . Hypertension Maternal Grandfather   . Cancer Mother   . Hyperlipidemia Mother   . Hypertension Mother   . Hearing loss Paternal Grandmother   . Hearing loss Paternal Grandfather   . Diabetes Paternal Grandfather   . Diabetes Daughter   . Colon cancer Paternal Uncle   . Hyperlipidemia Brother   . Hyperlipidemia Maternal Grandmother    Allergies and Medications: Allergies  Allergen Reactions  . Adhesive [Tape]     This is tape as well as adhesive on bandaids.  . Gabapentin     Unknown reaction  . Ivp Dye [Iodinated Diagnostic Agents] Swelling  . Propoxyphene Hcl Other (See Comments)    hallucination  . Seroquel [Quetiapine Fumerate]      Unknown reaction  . Penicillins Rash  . Sulfonamide Derivatives Rash   No current facility-administered medications on file prior to encounter.   Current Outpatient Prescriptions on File Prior to Encounter  Medication Sig Dispense Refill  . ALPRAZOLAM XR 1 MG 24 hr tablet Take 3 mg by mouth at bedtime.       Marland Kitchen dexlansoprazole (DEXILANT) 60 MG capsule Take 1 capsule (60 mg total) by mouth daily.  90 capsule  3  . losartan-hydrochlorothiazide (HYZAAR) 50-12.5 MG per tablet Take 1 tablet by mouth daily.  90 tablet  3  . albuterol (PROVENTIL HFA;VENTOLIN HFA) 108 (90 BASE) MCG/ACT inhaler Inhale 2 puffs into the lungs every 6 (six) hours as needed for wheezing or shortness of breath.  1 Inhaler  3    Objective: BP 106/79  Pulse 88  Temp(Src) 98 F (36.7 C)  Resp 16  SpO2 100% Exam: General: NAD, resting comfortably in bed HEENT: NCAT, PERRL, MMM, no apparent tongue or mucus membrane swelling, no lip swelling noted Cardiovascular: rrr, no murmurs appreciated Respiratory: CTAB, no wheezes or crackles  Abdomen: soft, mild tenderness to palpation in LLQ, no guarding or rebound Extremities: no edema noted Skin: no lesions noted Neuro: sensation to light touch in left V2 diminished greater than left V1 and V2 (both slightly diminished to patient), note right V2 diminished as well, otherwise, CN 3-12 intact, 5/5 strength in bilateral biceps, triceps, grip, hip flexion, quads, plantar and dorsiflexion, sensation to light touch intact on the right side, described as different on the left arm and outside of the left leg, intact on inner left leg, 2+ brachoradialis and patellar reflexes, normal heel to shin, rapid alternating movements, and finger to nose  Labs and Imaging: CBC BMET   Recent Labs Lab 10/21/13 2313 10/21/13 2325  WBC 6.6  --   HGB 13.0 12.9  HCT 38.1 38.0  PLT 239  --     Recent Labs Lab 10/21/13 2325  NA 140  K 3.2*  CL 101  BUN 16  CREATININE 1.20*  GLUCOSE  101*     istat trop neg INR 1 EKG NSR, no ischemic changes  Ct Head (brain) Wo Contrast  10/21/2013   CLINICAL DATA:  Code stroke  EXAM: CT HEAD WITHOUT CONTRAST  TECHNIQUE: Contiguous axial images were obtained from the base of the skull through the vertex without intravenous contrast.  COMPARISON:  US SOFT TISSUE HEAD/NECK dated 06/02/2013; MR HEAD W/O CM dated 09/28/2010; CT HEAD W/O CM dated 05/07/2006  FINDINGS: No mass effect, midline shift, or acute intracranial hemorrhage. Brain parenchyma  and ventricular system are unremarkable.  IMPRESSION: No acute intracranial pathology.   Electronically Signed   By: Maryclare Bean M.D.   On: 10/21/2013 23:09    Leone Haven, MD 10/22/2013, 2:05 AM PGY-2, [place] [facility] pager: [phone], text pages welcome

## 2013-10-22 NOTE — Progress Notes (Signed)
PT Cancellation Note  Patient Details Name: Tamara [name] MRN: [id number] DOB: March 08, 1961   Cancelled Treatment:    Reason Eval/Treat Not Completed: Patient not medically ready.  Order for start 2/24.  Will f/u tomorrow.     Thornton Papas Hartman Minahan 10/22/2013, 6:52 AM

## 2013-10-22 NOTE — Discharge Summary (Signed)
[facility] Discharge Summary  Patient name: Tamara [name] Medical record number: [id number] Date of birth: [birth year] Age: 53 y.o. Gender: female Date of Admission: 10/21/2013  Date of Discharge: 10/22/13 Admitting Physician: Zigmund Gottron, MD  Primary Care Provider: [name], MD Consultants: neuro  Indication for Hospitalization: left sided numbness  Discharge Diagnoses/Problem List:  Patient Active Problem List   Diagnosis Date Noted  . Disturbance of skin sensation 10/21/2013  . Abnormal EKG 08/31/2011  . ASTHMA 08/01/2010  . HYPERLIPIDEMIA 09/17/2007  . OBESITY 09/17/2007  . ANXIETY 09/17/2007  . DEPRESSION 09/17/2007  . HYPERTENSION 09/17/2007  . GERD 09/17/2007  . ACNE, MILD 09/17/2007   Disposition: home  Discharge Condition: improved  Discharge Exam:  BP 108/72  Pulse 80  Temp(Src) 97.5 F (36.4 C) (Oral)  Resp 16  Ht 5\' 2"  (1.575 m)  Wt 181 lb (82.101 kg)  BMI 33.10 kg/m2  SpO2 100% General: NAD, resting comfortably in bed  HEENT: NCAT, PERRL, MMM, no apparent tongue or mucus membrane swelling, no lip swelling noted  Cardiovascular: rrr, no murmurs appreciated  Respiratory: CTAB, no wheezes or crackles  Abdomen: soft, mild tenderness to palpation in LLQ, no guarding or rebound  Extremities: no edema noted  Skin: no lesions noted  Neuro: alert and oriented; verbose, questionable decreased sensation to light touch in left V2 otherwise neurologically intact; nml reflexes, nml strength  Brief Hospital Course:  Asal Teas is a 53 y.o. female presenting with left sided numbness. PMH is significant for anxiety, depression, HTN, HLD, GERD.   # Left sided numbness: Pt noting numbness on left foot leg, arm and face assc with some headache. No weakness or other deficits. On arrival to the ED, code stroke/neuro eval initiated. Pt not a candidate for TPA. CT head without abnormalities. MRI head obtained neg. Neuro  felt unlikely to be 2/2 intracranial deficit/CNS primary issue. Pt placed on ASA 325 and lipitor 40, given extensive family hx of vascular disease and personal history of HLD and HTN. 2D echo (EF 55-60, mildly dilated R ventricle), carotids (1-39% bilat stenosis) obtained. Significant psychiatric hx (see below) and lumbar/cervical radiculopathies felt to be largely contributory.     # Psych: Extensive anxiety and depression history. Previous ECT with associated memory deficits. On multiple medications for this issue. Continued home xanax, trileptal, and restoril, and vilazodone Rec close f/u with outpatient psych. Recent stressors include mother with leukemia and father on life support.   # Sensation of tongue swelling: No apparent swelling on evaluation, no signs of breathing compromise, no reoccurrence. Held off on H2 blocker/steroids given absence of sx on re-eval. Consider switching/d'cing ARB.  # HTN: In setting of potential TIA/stroke, pt allowed permissive HTN. Additionally held ARB given potential swelling of tongue. Was normotensive throughout admission. Pt to resume hyzaar on discharge. Would rec close outpt f/up for potential adverse reaction.    # Abdominal pain: Noted in LLQ with minimal tenderness to palpation on exam on admission. No masses palpated. No blood in stool. No symptoms to indicate specific cause. Pt should have routine colonoscopy for cancer screening as outpatient.   # AKI: Cr 1.2 on admission. Baseline appears to be 1. Pt allowed to take ample PO. Cr improved to 0.81.    # Hypokalemia: 3.2 on admission. Repleted with kdur 40 mEq x2. Repeat BMET 3.7  # Asthma: Hx of asthma but no wheezes on exam. Continued home prn albuterol   # Cystic acne: Continued on home doxycycline  1 tab daily   # GERD: Continued on protonix daily.  Issues for Follow Up:  1. F/up resolution of tongue swelling- if should reoccur, would recommend d/c-ing ARB 2. Compliance with statin and  ASA  Significant Procedures: none  Significant Labs and Imaging:   Recent Labs Lab 10/21/13 2313 10/21/13 2325 10/22/13 0528  WBC 6.6  --  8.3  HGB 13.0 12.9 12.4  HCT 38.1 38.0 35.6*  PLT 239  --  221    Recent Labs Lab 10/21/13 2313 10/21/13 2325 10/22/13 0528  NA 140 140 140  K 3.3* 3.2* 3.7  CL 101 101 102  CO2 25  --  23  GLUCOSE 100* 101* 92  BUN 17 16 15   CREATININE 1.06 1.20* 0.81  CALCIUM 9.7  --  9.4  ALKPHOS 94  --   --   AST 19  --   --   ALT 18  --   --   ALBUMIN 4.0  --   --     CT head nml MRI nml  Results/Tests Pending at Time of Discharge: final doppler report  Discharge Medications:    Medication List         albuterol 108 (90 BASE) MCG/ACT inhaler  Commonly known as:  PROVENTIL HFA;VENTOLIN HFA  Inhale 2 puffs into the lungs every 6 (six) hours as needed for wheezing or shortness of breath.     ALPRAZolam 1 MG tablet  Commonly known as:  XANAX  Take 0.5 mg by mouth daily as needed for anxiety.     ALPRAZOLAM XR 1 MG 24 hr tablet  Generic drug:  ALPRAZolam  Take 3 mg by mouth at bedtime.     aspirin 325 MG tablet  Take 1 tablet (325 mg total) by mouth daily.     atorvastatin 40 MG tablet  Commonly known as:  LIPITOR  Take 1 tablet (40 mg total) by mouth daily at 6 PM.     dexlansoprazole 60 MG capsule  Commonly known as:  DEXILANT  Take 1 capsule (60 mg total) by mouth daily.     diclofenac sodium 1 % Gel  Commonly known as:  VOLTAREN  Apply 2 g topically daily as needed (for pain).     doxycycline 100 MG tablet  Commonly known as:  VIBRA-TABS  Take 100 mg by mouth daily.     hydrocortisone 2.5 % rectal cream  Commonly known as:  ANUSOL-HC  Place 1 application rectally 2 (two) times daily as needed for hemorrhoids or itching.     losartan-hydrochlorothiazide 50-12.5 MG per tablet  Commonly known as:  HYZAAR  Take 1 tablet by mouth daily.     LYSINE PO  Take 1 tablet by mouth 2 (two) times daily.      Oxcarbazepine 300 MG tablet  Commonly known as:  TRILEPTAL  Take 300-600 mg by mouth See admin instructions. Take 2 tablets in the morning and 1 at bedtime     temazepam 15 MG capsule  Commonly known as:  RESTORIL  Take 45 mg by mouth at bedtime.     Vilazodone HCl 20 MG Tabs  Take 1 tablet by mouth daily.        Discharge Instructions: Please refer to Patient Instructions section of EMR for full details.  Patient was counseled important signs and symptoms that should prompt return to medical care, changes in medications, dietary instructions, activity restrictions, and follow up appointments.   Follow-Up Appointments:     Follow-up Information  Schedule an appointment as soon as possible for a visit with [name], MD.   Specialty:  Family Medicine   Contact information:   [place] [id number] [phone]       Langston Masker, MD 10/22/2013, 7:30 PM PGY-1, [place]

## 2013-10-22 NOTE — Discharge Instructions (Signed)
Tamara Tamara [name], [name] were seen at the hospital for numbness, weakness and tingling concerning for transient ischemic attack vs a stroke. We obtained both CT and MRI imaging of your brain and were pleased to see that these looked completely normal. We also looked at your vasculature in your neck and your heart function and were reassured that these looked good as well. It will be important when you go home to start taking aspirin as prescribed as well as the cholesterol medication. These can prevent your risk of something bad happening in the future, especially given your family history. Should you have numbness/dizziness/slurred speech/weakness or chest pain, shortness or breath please call 911 right away as these could be signs of a medical emergency   STROKE/TIA DISCHARGE INSTRUCTIONS SMOKING Cigarette smoking nearly doubles your risk of having a stroke & is the single most alterable risk factor  If you smoke or have smoked in the last 12 months, you are advised to quit smoking for your health.  Most of the excess cardiovascular risk related to smoking disappears within a year of stopping.  Ask you doctor about anti-smoking medications  [place] Quit Line: 1-800-QUIT NOW  Free Smoking Cessation Classes [phone]  CHOLESTEROL Know your levels; limit fat & cholesterol in your diet  Lipid Panel     Component Value Date/Time   CHOL 277* 09/10/2013 1215   TRIG 324* 09/10/2013 1215   HDL 47 09/10/2013 1215   CHOLHDL 5.9 09/10/2013 1215   VLDL 65* 09/10/2013 1215   LDLCALC 165* 09/10/2013 1215      Many patients benefit from treatment even if their cholesterol is at goal.  Goal: Total Cholesterol (CHOL) less than 160  Goal:  Triglycerides (TRIG) less than 150  Goal:  HDL greater than 40  Goal:  LDL (LDLCALC) less than 100   BLOOD PRESSURE American Stroke Association blood pressure target is less that 120/80 mm/Hg  Your discharge blood pressure is:  BP: 126/78 mmHg  Monitor your blood  pressure  Limit your salt and alcohol intake  Many individuals will require more than one medication for high blood pressure  DIABETES (A1c is a blood sugar average for last 3 months) Goal HGBA1c is under 7% (HBGA1c is blood sugar average for last 3 months)  Diabetes: No known diagnosis of diabetes    Lab Results  Component Value Date   HGBA1C 4.9 10/22/2013     Your HGBA1c can be lowered with medications, healthy diet, and exercise.  Check your blood sugar as directed by your physician  Call your physician if you experience unexplained or low blood sugars.  PHYSICAL ACTIVITY/REHABILITATION Goal is 30 minutes at least 4 days per week  Activity: Increase activity slowly, Therapies:  Return to work:   Activity decreases your risk of heart attack and stroke and makes your heart stronger.  It helps control your weight and blood pressure; helps you relax and can improve your mood.  Participate in a regular exercise program.  Talk with your doctor about the best form of exercise for you (dancing, walking, swimming, cycling).  DIET/WEIGHT Goal is to maintain a healthy weight  Your discharge diet is: General  liquids Your height is:  Height: 5\' 2"  (157.5 cm) Your current weight is: Weight: 82.101 kg (181 lb) Your Body Mass Index (BMI) is:  BMI (Calculated): 33.2  Following the type of diet specifically designed for you will help prevent another stroke.  Your goal weight range is:    Your goal Body  Mass Index (BMI) is 19-24.  Healthy food habits can help reduce 3 risk factors for stroke:  High cholesterol, hypertension, and excess weight.  RESOURCES Stroke/Support Group:  Call [phone]   STROKE EDUCATION PROVIDED/REVIEWED AND GIVEN TO PATIENT Stroke warning signs and symptoms How to activate emergency medical system (call 911). Medications prescribed at discharge. Need for follow-up after discharge. Personal risk factors for stroke. Pneumonia vaccine given:  Flu vaccine  given:  My questions have been answered, the writing is legible, and I understand these instructions.  I will adhere to these goals & educational materials that have been provided to me after my discharge from the hospital.

## 2013-10-22 NOTE — Progress Notes (Signed)
UR Completed Geraldine Sandberg Graves-Bigelow, RN,BSN [phone]  

## 2013-10-22 NOTE — ED Provider Notes (Signed)
CSN: [id number]     Arrival date & time 10/21/13  2232 History   First MD Initiated Contact with Patient 10/21/13 2301     Chief Complaint  Patient presents with  . Code Stroke    An emergency department physician performed an initial assessment on this suspected stroke patient at 2250. (Consider location/radiation/quality/duration/timing/severity/associated sxs/prior Treatment) HPI Comments: 53 y.o. female comes in cc of left sided numbness. Hx of HTN, HL.  Was at home with her significant other this evening and on going to the bathroom noted numbness in her left foot.  Over time the numbness traveled to the arm and the left side of the face. And currently covers her entire left side. She has no visual complains, weakness. No hx of same before. Code stroke was called from triage.     The history is provided by the patient.    Past Medical History  Diagnosis Date  . Depression   . HTN (hypertension)   . Hyperlipemia   . Allergy   . GERD (gastroesophageal reflux disease)   . Neuromuscular disorder   . Anemia   . Anxiety   . Asthma   . Shock therapy as cause of abnormal reaction of patient or of later complication without mention of misadventure at time of procedure   . History of short term memory loss    Past Surgical History  Procedure Laterality Date  . Cholecystectomy    . Abdominal hysterectomy      partial  . Colonoscopy     Family History  Problem Relation Age of Onset  . Arrhythmia Father   . Stroke Father   . Hyperlipidemia Father   . Heart disease Father   . Hypertension Father   . Coronary artery disease Maternal Grandfather 40    Died 58  . Heart disease Maternal Grandfather   . Hypertension Maternal Grandfather   . Cancer Mother   . Hyperlipidemia Mother   . Hypertension Mother   . Hearing loss Paternal Grandmother   . Hearing loss Paternal Grandfather   . Diabetes Paternal Grandfather   . Diabetes Daughter   . Colon cancer Paternal Uncle   .  Hyperlipidemia Brother   . Hyperlipidemia Maternal Grandmother    History  Substance Use Topics  . Smoking status: Never Smoker   . Smokeless tobacco: Never Used  . Alcohol Use: No   OB History   Grav Para Term Preterm Abortions TAB SAB Ect Mult Living                 Review of Systems  Constitutional: Positive for activity change.  Respiratory: Negative for shortness of breath.   Cardiovascular: Negative for chest pain.  Gastrointestinal: Negative for nausea, vomiting and abdominal pain.  Genitourinary: Negative for dysuria.  Musculoskeletal: Negative for neck pain.  Neurological: Positive for numbness. Negative for headaches.  All other systems reviewed and are negative.     Allergies  Adhesive; Gabapentin; Ivp dye; Propoxyphene hcl; Seroquel; Penicillins; and Sulfonamide derivatives  Home Medications   Prior to Admission medications   Medication Sig Start Date End Date Taking? Authorizing Provider  ALPRAZolam Duanne Moron) 1 MG tablet Take 0.5 mg by mouth daily as needed for anxiety.   Yes Historical Provider, MD  ALPRAZOLAM XR 1 MG 24 hr tablet Take 3 mg by mouth at bedtime.  08/10/11  Yes Historical Provider, MD  dexlansoprazole (DEXILANT) 60 MG capsule Take 1 capsule (60 mg total) by mouth daily. 06/04/13  Yes [name], MD  diclofenac sodium (VOLTAREN) 1 % GEL Apply 2 g topically daily as needed (for pain).   Yes Historical Provider, MD  doxycycline (VIBRA-TABS) 100 MG tablet Take 100 mg by mouth daily.   Yes Historical Provider, MD  hydrocortisone (ANUSOL-HC) 2.5 % rectal cream Place 1 application rectally 2 (two) times daily as needed for hemorrhoids or itching.   Yes Historical Provider, MD  losartan-hydrochlorothiazide (HYZAAR) 50-12.5 MG per tablet Take 1 tablet by mouth daily. 09/10/13  Yes [name], MD  LYSINE PO Take 1 tablet by mouth 2 (two) times daily.   Yes Historical Provider, MD  Oxcarbazepine (TRILEPTAL) 300 MG tablet Take 300-600 mg by mouth See  admin instructions. Take 2 tablets in the morning and 1 at bedtime   Yes Historical Provider, MD  temazepam (RESTORIL) 15 MG capsule Take 45 mg by mouth at bedtime.   Yes Historical Provider, MD  Vilazodone HCl 20 MG TABS Take 1 tablet by mouth daily.   Yes Historical Provider, MD  albuterol (PROVENTIL HFA;VENTOLIN HFA) 108 (90 BASE) MCG/ACT inhaler Inhale 2 puffs into the lungs every 6 (six) hours as needed for wheezing or shortness of breath. 09/10/13   [name], MD   BP 152/102  Pulse 73  Temp(Src) 98 F (36.7 C)  Resp 19  SpO2 100% Physical Exam  Nursing note and vitals reviewed. Constitutional: She is oriented to person, place, and time. She appears well-developed and well-nourished.  HENT:  Head: Normocephalic and atraumatic.  Eyes: EOM are normal. Pupils are equal, round, and reactive to light.  Neck: Neck supple.  Cardiovascular: Normal rate, regular rhythm and normal heart sounds.   No murmur heard. Pulmonary/Chest: Effort normal. No respiratory distress.  Abdominal: Soft. She exhibits no distension. There is no tenderness. There is no rebound and no guarding.  Neurological: She is alert and oriented to person, place, and time.  Cerebellar exam is normal (finger to nose) Sensory exam normal for bilateral upper and lower extremities - reveals subjective numbness - left side Motor exam is 4+/5 NIHSS - 2   Skin: Skin is warm and dry.    ED Course  Procedures (including critical care time) Labs Review Labs Reviewed  DIFFERENTIAL - Abnormal; Notable for the following:    Neutrophils Relative % 41 (*)    Lymphocytes Relative 48 (*)    All other components within normal limits  CBG MONITORING, ED - Abnormal; Notable for the following:    Glucose-Capillary 135 (*)    All other components within normal limits  I-STAT CHEM 8, ED - Abnormal; Notable for the following:    Potassium 3.2 (*)    Creatinine, Ser 1.20 (*)    Glucose, Bld 101 (*)    Calcium, Ion 1.25 (*)     All other components within normal limits  PROTIME-INR  APTT  CBC  COMPREHENSIVE METABOLIC PANEL  I-STAT TROPOININ, ED    Imaging Review Ct Head (brain) Wo Contrast  10/21/2013   CLINICAL DATA:  Code stroke  EXAM: CT HEAD WITHOUT CONTRAST  TECHNIQUE: Contiguous axial images were obtained from the base of the skull through the vertex without intravenous contrast.  COMPARISON:  US SOFT TISSUE HEAD/NECK dated 06/02/2013; MR HEAD W/O CM dated 09/28/2010; CT HEAD W/O CM dated 05/07/2006  FINDINGS: No mass effect, midline shift, or acute intracranial hemorrhage. Brain parenchyma and ventricular system are unremarkable.  IMPRESSION: No acute intracranial pathology.   Electronically Signed   By: Maryclare Bean M.D.   On: 10/21/2013 23:09  EKG Interpretation   Date/Time:  Wednesday October 21 2013 23:11:39 EDT Ventricular Rate:  78 PR Interval:  150 QRS Duration: 80 QT Interval:  405 QTC Calculation: 461 R Axis:   76 Text Interpretation:  Sinus rhythm No STEMI    intervals are normal normal  axis Reconfirmed by Vianny Schraeder, MD, Loyda Costin ([id number]) on 10/22/2013 12:30:23 AM      MDM   Final diagnoses:  Disturbance of skin sensation    Pt comes in with left sided numbness.  DDx includes:  Stroke - ischemic vs. hemorrhagic TIA Neuropathy Myelitis Electrolyte abnormality Neuropathy Muscular disease  She also has some right sided facial numbness, and post CT scan, revealed that she felt that her mouth was swelling and had some trouble with breathing. She had swallow eval - base of the tongue appears may be edematous - rest of the airway and lung assessment normal. Epi pen given.  Admit for Stroke workup.  Varney Biles, MD 10/22/13 [phone]

## 2013-10-22 NOTE — Progress Notes (Signed)
*  PRELIMINARY RESULTS* Vascular Ultrasound Carotid Duplex (Doppler) has been completed.   Findings suggest 1-39% internal carotid artery stenosis bilaterally. Vertebral arteries are patent with antegrade flow.  10/22/2013 4:08 PM Ralene Cork, RVT

## 2013-10-22 NOTE — Progress Notes (Signed)
Echocardiogram 2D Echocardiogram has been performed.  Tamara [name] 10/22/2013, 9:33 AM

## 2013-10-22 NOTE — ED Notes (Signed)
Pt reports she thinks her tongue is swelling. Pt talking in clear complete sentences. Air-way intact. Dr. Kathrynn Humble notified.

## 2013-10-22 NOTE — H&P (Signed)
Seen and examined.  Discussed with Dr. Caryl Bis.  Agree with his admit, documentation and management.  Briefly, Tamara [name] presents with left leg, arm and facial numbness.  Has risk factors of hypertension, hypercholesterolemia and +FHx for cerebrovascular disease.  So, obviously, we have admitted for R/O stroke/TIA.  I am actually betting against this.  She has both cervical and lumbar spine disease and has a significant psychiatric history.  The work up needs to be done and I bet it turns out normal.

## 2013-10-23 NOTE — Discharge Summary (Signed)
Seen and examined on the day of DC.  Agree with DC plan as outlined by Dr. Skeet Simmer.

## 2013-10-24 ENCOUNTER — Ambulatory Visit (INDEPENDENT_AMBULATORY_CARE_PROVIDER_SITE_OTHER): Payer: Medicare Other | Admitting: Family Medicine

## 2013-10-24 ENCOUNTER — Emergency Department (HOSPITAL_COMMUNITY)
Admission: EM | Admit: 2013-10-24 | Discharge: 2013-10-24 | Disposition: A | Discharge status: Home / Self Care | Payer: Medicare Other | Attending: Emergency Medicine | Admitting: Emergency Medicine

## 2013-10-24 ENCOUNTER — Encounter (HOSPITAL_COMMUNITY): Payer: Self-pay | Admitting: Emergency Medicine

## 2013-10-24 VITALS — BP 102/70 | HR 81 | Temp 98.3°F | Resp 16 | Ht 62.0 in | Wt 181.0 lb

## 2013-10-24 DIAGNOSIS — F329 Major depressive disorder, single episode, unspecified: Secondary | ICD-10-CM | POA: Insufficient documentation

## 2013-10-24 DIAGNOSIS — H538 Other visual disturbances: Secondary | ICD-10-CM | POA: Diagnosis not present

## 2013-10-24 DIAGNOSIS — I10 Essential (primary) hypertension: Secondary | ICD-10-CM | POA: Diagnosis not present

## 2013-10-24 DIAGNOSIS — F05 Delirium due to known physiological condition: Secondary | ICD-10-CM

## 2013-10-24 DIAGNOSIS — F3289 Other specified depressive episodes: Secondary | ICD-10-CM | POA: Diagnosis not present

## 2013-10-24 DIAGNOSIS — J45909 Unspecified asthma, uncomplicated: Secondary | ICD-10-CM | POA: Insufficient documentation

## 2013-10-24 DIAGNOSIS — K219 Gastro-esophageal reflux disease without esophagitis: Secondary | ICD-10-CM | POA: Insufficient documentation

## 2013-10-24 DIAGNOSIS — R519 Headache, unspecified: Secondary | ICD-10-CM

## 2013-10-24 DIAGNOSIS — H539 Unspecified visual disturbance: Secondary | ICD-10-CM | POA: Insufficient documentation

## 2013-10-24 DIAGNOSIS — Z862 Personal history of diseases of the blood and blood-forming organs and certain disorders involving the immune mechanism: Secondary | ICD-10-CM | POA: Insufficient documentation

## 2013-10-24 DIAGNOSIS — R202 Paresthesia of skin: Secondary | ICD-10-CM

## 2013-10-24 DIAGNOSIS — R51 Headache: Secondary | ICD-10-CM | POA: Diagnosis not present

## 2013-10-24 DIAGNOSIS — Z792 Long term (current) use of antibiotics: Secondary | ICD-10-CM | POA: Insufficient documentation

## 2013-10-24 DIAGNOSIS — R410 Disorientation, unspecified: Secondary | ICD-10-CM | POA: Diagnosis not present

## 2013-10-24 DIAGNOSIS — Z79899 Other long term (current) drug therapy: Secondary | ICD-10-CM | POA: Insufficient documentation

## 2013-10-24 DIAGNOSIS — F411 Generalized anxiety disorder: Secondary | ICD-10-CM | POA: Insufficient documentation

## 2013-10-24 DIAGNOSIS — Z7982 Long term (current) use of aspirin: Secondary | ICD-10-CM | POA: Diagnosis not present

## 2013-10-24 DIAGNOSIS — R6889 Other general symptoms and signs: Secondary | ICD-10-CM | POA: Diagnosis not present

## 2013-10-24 DIAGNOSIS — Z880 Allergy status to penicillin: Secondary | ICD-10-CM | POA: Insufficient documentation

## 2013-10-24 DIAGNOSIS — R209 Unspecified disturbances of skin sensation: Secondary | ICD-10-CM

## 2013-10-24 DIAGNOSIS — E785 Hyperlipidemia, unspecified: Secondary | ICD-10-CM | POA: Diagnosis not present

## 2013-10-24 LAB — URINALYSIS, ROUTINE W REFLEX MICROSCOPIC
BILIRUBIN URINE: NEGATIVE
GLUCOSE, UA: NEGATIVE mg/dL
Hgb urine dipstick: NEGATIVE
KETONES UR: NEGATIVE mg/dL
LEUKOCYTES UA: NEGATIVE
Nitrite: NEGATIVE
PH: 6 (ref 5.0–8.0)
Protein, ur: NEGATIVE mg/dL
Specific Gravity, Urine: 1.021 (ref 1.005–1.030)
Urobilinogen, UA: 0.2 mg/dL (ref 0.0–1.0)

## 2013-10-24 LAB — I-STAT CHEM 8, ED
BUN: 16 mg/dL (ref 6–23)
CALCIUM ION: 1.21 mmol/L (ref 1.12–1.23)
CHLORIDE: 98 meq/L (ref 96–112)
Creatinine, Ser: 1 mg/dL (ref 0.50–1.10)
GLUCOSE: 94 mg/dL (ref 70–99)
HCT: 42 % (ref 36.0–46.0)
Hemoglobin: 14.3 g/dL (ref 12.0–15.0)
Potassium: 3.8 mEq/L (ref 3.7–5.3)
Sodium: 136 mEq/L — ABNORMAL LOW (ref 137–147)
TCO2: 23 mmol/L (ref 0–100)

## 2013-10-24 MED ORDER — HYDROCODONE-ACETAMINOPHEN 5-325 MG PO TABS
1.0000 | ORAL_TABLET | Freq: Once | ORAL | Status: AC
  Administered 2013-10-24: 1 via ORAL
  Filled 2013-10-24: qty 1

## 2013-10-24 NOTE — Discharge Instructions (Signed)
Followup with your doctor and your neurologist as soon as possible for further evaluation.  You are having a headache. No specific cause was found today for your headache. It may have been a migraine or other cause of headache. Stress, anxiety, fatigue, and depression are common triggers for headaches. Your headache today does not appear to be life-threatening or require hospitalization, but often the exact cause of headaches is not determined in the emergency department. Therefore, follow-up with your doctor is very important to find out what may have caused your headache, and whether or not you need any further diagnostic testing or treatment. Sometimes headaches can appear benign (not harmful), but then more serious symptoms can develop which should prompt an immediate re-evaluation by your doctor or the emergency department. SEEK MEDICAL ATTENTION IF: You develop possible problems with medications prescribed.  The medications don't resolve your headache, if it recurs , or if you have multiple episodes of vomiting or can't take fluids. You have a change from the usual headache. RETURN IMMEDIATELY IF you develop a sudden, severe headache or confusion, become poorly responsive or faint, develop a fever above 100.61F or problem breathing, have a change in speech, vision, swallowing, or understanding, or develop new weakness, numbness, tingling, incoordination, or have a seizure.

## 2013-10-24 NOTE — ED Provider Notes (Signed)
CSN: [id number]     Arrival date & time 10/24/13  1830 History   First MD Initiated Contact with Patient 10/24/13 1840     Chief Complaint  Patient presents with  . Visual Field Change  . Headache  . Arm Problem     (Consider location/radiation/quality/duration/timing/severity/associated sxs/prior Treatment) The history is provided by the patient and medical records.   history of present illness: 53 year-old female who presents with chief complaints of headache, visual changes, and left arm tingling. Symptoms have been present for the past 5 days. She initially presented to the emergency department 5 days ago where she was admitted for TIA workup. She had normal CT scan, MRI, and carotid Dopplers. She was instructed to followup with her PCP. She reports that her symptoms have been unchanged since that time. No worsening of symptoms. No new symptoms. Patient is unable to describe her visual disturbance. It is not blurred vision. She has no loss of vision. She is able to read without difficulty. She states that "my eyes just don't seem right". She went to urgent care today do to continue symptoms and they referred her to the emergency department for further evaluation.  Past Medical History  Diagnosis Date  . Depression   . HTN (hypertension)   . Hyperlipemia   . Allergy   . GERD (gastroesophageal reflux disease)   . Neuromuscular disorder   . Anemia   . Anxiety   . Asthma   . Shock therapy as cause of abnormal reaction of patient or of later complication without mention of misadventure at time of procedure   . History of short term memory loss    Past Surgical History  Procedure Laterality Date  . Cholecystectomy    . Abdominal hysterectomy      partial  . Colonoscopy     Family History  Problem Relation Age of Onset  . Arrhythmia Father   . Stroke Father   . Hyperlipidemia Father   . Heart disease Father   . Hypertension Father   . Coronary artery disease Maternal  Grandfather 40    Died 58  . Heart disease Maternal Grandfather   . Hypertension Maternal Grandfather   . Cancer Mother   . Hyperlipidemia Mother   . Hypertension Mother   . Hearing loss Paternal Grandmother   . Hearing loss Paternal Grandfather   . Diabetes Paternal Grandfather   . Diabetes Daughter   . Colon cancer Paternal Uncle   . Hyperlipidemia Brother   . Hyperlipidemia Maternal Grandmother    History  Substance Use Topics  . Smoking status: Never Smoker   . Smokeless tobacco: Never Used  . Alcohol Use: No   OB History   Grav Para Term Preterm Abortions TAB SAB Ect Mult Living                 Review of Systems  Constitutional: Negative for fever and chills.  HENT: Negative for congestion.   Eyes: Positive for visual disturbance. Negative for pain.  Respiratory: Negative for shortness of breath.   Cardiovascular: Negative for chest pain.  Gastrointestinal: Negative for nausea, vomiting, abdominal pain, diarrhea and constipation.  Genitourinary: Negative for dysuria.  Musculoskeletal: Negative for back pain.  Skin: Negative for rash and wound.  Neurological: Positive for headaches. Negative for weakness.       Paresthesias in the left arm  All other systems reviewed and are negative.     Allergies  Adhesive; Gabapentin; Ivp dye; Propoxyphene hcl; Seroquel; Statins;  Penicillins; and Sulfonamide derivatives  Home Medications   Prior to Admission medications   Medication Sig Start Date End Date Taking? Authorizing Provider  albuterol (PROVENTIL HFA;VENTOLIN HFA) 108 (90 BASE) MCG/ACT inhaler Inhale 2 puffs into the lungs every 6 (six) hours as needed for wheezing or shortness of breath. 09/10/13   [name], MD  ALPRAZolam Duanne Moron) 1 MG tablet Take 0.5 mg by mouth daily as needed for anxiety.    Historical Provider, MD  ALPRAZOLAM XR 1 MG 24 hr tablet Take 3 mg by mouth at bedtime.  08/10/11   Historical Provider, MD  aspirin 325 MG tablet Take 1 tablet (325  mg total) by mouth daily. 10/22/13   Langston Masker, MD  atorvastatin (LIPITOR) 40 MG tablet Take 1 tablet (40 mg total) by mouth daily at 6 PM. 10/22/13   Langston Masker, MD  dexlansoprazole (DEXILANT) 60 MG capsule Take 1 capsule (60 mg total) by mouth daily. 06/04/13   [name], MD  diclofenac sodium (VOLTAREN) 1 % GEL Apply 2 g topically daily as needed (for pain).    Historical Provider, MD  doxycycline (VIBRA-TABS) 100 MG tablet Take 100 mg by mouth daily.    Historical Provider, MD  hydrocortisone (ANUSOL-HC) 2.5 % rectal cream Place 1 application rectally 2 (two) times daily as needed for hemorrhoids or itching.    Historical Provider, MD  losartan-hydrochlorothiazide (HYZAAR) 50-12.5 MG per tablet Take 1 tablet by mouth daily. 09/10/13   [name], MD  LYSINE PO Take 1 tablet by mouth 2 (two) times daily.    Historical Provider, MD  Oxcarbazepine (TRILEPTAL) 300 MG tablet Take 300-600 mg by mouth daily.     Historical Provider, MD  temazepam (RESTORIL) 15 MG capsule Take 45 mg by mouth at bedtime.    Historical Provider, MD  Vilazodone HCl 20 MG TABS Take 1 tablet by mouth daily.    Historical Provider, MD   BP 120/87  Pulse 88  Temp(Src) 98 F (36.7 C) (Oral)  Resp 24  SpO2 100% Physical Exam  Nursing note and vitals reviewed. Constitutional: She is oriented to person, place, and time. She appears well-developed and well-nourished. No distress.  HENT:  Head: Normocephalic and atraumatic.  Eyes: Conjunctivae and EOM are normal. Pupils are equal, round, and reactive to light.  Visual fields intact  Neck: Neck supple.  Cardiovascular: Normal rate, regular rhythm, normal heart sounds and intact distal pulses.   Pulmonary/Chest: Effort normal and breath sounds normal. She has no wheezes. She has no rales.  Abdominal: Soft. She exhibits no distension. There is no tenderness.  Musculoskeletal: Normal range of motion.  Neurological: She is alert and oriented to person, place,  and time. She has normal strength. No cranial nerve deficit or sensory deficit. Coordination and gait normal. GCS eye subscore is 4. GCS verbal subscore is 5. GCS motor subscore is 6.  Skin: Skin is warm and dry.    ED Course  Procedures (including critical care time) Labs Review Labs Reviewed  I-STAT CHEM 8, ED - Abnormal; Notable for the following:    Sodium 136 (*)    All other components within normal limits  URINALYSIS, ROUTINE W REFLEX MICROSCOPIC    Imaging Review No results found.   EKG Interpretation None      MDM   Final diagnoses:  Paresthesias  Headache    53 year-old female with history of depression, hypertension, hyperlipidemia, GERD, anxiety who presents with chief complaints of headache, visual disturbance, and paresthesias in the  left arm for the past 5 days. AF VSS. Normal neurologic exam including visual fields, cranial nerves, sensation, motor, coordination, and gait.  Patient was admitted 5 days ago with workup for possible TIA at the onset of these symptoms. She had normal CT, MRI, Dopplers. Symptoms were felt to be influenced by her significant psychiatric history.  Chemistry and UA were unremarkable.  With normal neurologic exam, recent thorough workup for TIA, and no new neurologic deficits do not feel additional imaging and workup indicated at this time. Patient appropriate for discharge home with PCP followup. Recommend optometry or ophthalmology of evaluation for visual disturbance.  Treatment plan discussed with the patient and her daughter are in agreement and return precautions were given.    Renaldo Reel, MD 10/25/13 [phone]

## 2013-10-24 NOTE — ED Notes (Signed)
Pt arrived from [facility]gent [facility] [address] by [facility]. Pt was d/c from hospital Thursday post TIA. Pt stated that yesterday she developed a h/a became more disoriented which is better today but her vision in the left eye is blurry and she continues to have h/a that is in the posterior, left and top of head. Also c/o altered sensation in left arm that has increased and stated that it is really cold feeling today. Yesterday she also had increased fatigue.

## 2013-10-24 NOTE — ED Notes (Signed)
Pt stated that yesterday she was crying and sleeping a lot as well.

## 2013-10-24 NOTE — Patient Instructions (Addendum)
We will have you evaluated at the emergency room again for the recurrence of some of the symptoms from your prior evaluation and now blurry vision and confusion.  Make sure to discuss with them the problem taking stain medicine and options. Follow up after hospitalization with [place] as needed.

## 2013-10-24 NOTE — Progress Notes (Signed)
Subjective:    Patient ID: Tamara [name], female    DOB: 12-Mar-1961, 53 y.o.   MRN: [id number]  HPI Chief Complaint  Patient presents with  . Follow-up    from ER visit--feeling worse--having increased HA's. decreased memory, confusion, blurred left eye    This chart was scribed for Merri Ray, MD by Thea Alken, ED Scribe. This patient was seen in room 4 and the patient's care was started at 5:18 PM.  HPI Comments: Tamara [name] is a 53 y.o. female who presents to the [facility] here for a follow up regarding a stroke that occurred 3 days ago.Pt seen acutely due to a nature of symptoms discharged 2 days ago from hospital for left sided numbness in leg arm and face along with HA. A code stroke/neuro eval was initiated CT head and MRI head without acute normality. Neurology eval noted TIA discussed and advised to lower cholesterol.  place on aspirin 325 mg and Lipitor 40 qd. Noted prior discharge summary with anxiety and depress hist and social stressors. Carotid dopplers 1-39% stenosis pf the r and l internal carotid arteries 2d echo results were no cardiac source of embolism identified. EF 55-60% and normal law of motion   Today pt reports that yesterday felt worse than today. She reports something is not right. She reports 1 day ago she was disoriented. She reports that she has more confusion since she left hospital. She report asking her boyfriend the same questions over again. Pt reports improvement to disorientation  Pt also reports fatigue, HA to the top, the left, and the back of head, left sided facial pain and reports left arm feels cold and heavy. Pt reports 3 nights ago before TIA she had numbness and tingling in fingers and toe. Today, she reports her entire arm is cold and painful. Pt denies weakness. Pt reports intermittent high pitch sound but denies hearing sound at this time.  Pt reports blurry vision to left eye onset a couple hours ago. Pt  states that her left eye feels as if was protruding earlier but now feels sunken in. She reports that she had blurry vision before admitted to hospital but now has returned. Pt reports HA left top and back head. Pt denies palpation. Pt  Significant other states that pt was resting for about an hour  2-3 hours ago. He reports  blurry vision started about and hour ago.   Pt reports that she usually sees Dr. Joseph Art .  Pt reports that she was put on [place] but is unable to take this medication due to side effects.  5:51 PM EMS called for transport. 5:59 PM advised charging nurses at [facility] ED.  Patient Active Problem List   Diagnosis Date Noted  . Stroke 10/22/2013  . Disturbance of skin sensation 10/21/2013  . Abnormal EKG 08/31/2011  . ASTHMA 08/01/2010  . HEMORRHOIDS-EXTERNAL 05/30/2010  . ALLERGIC RHINITIS 04/14/2008  . HYPERLIPIDEMIA 09/17/2007  . OBESITY 09/17/2007  . ANXIETY 09/17/2007  . DEPRESSION 09/17/2007  . HYPERTENSION 09/17/2007  . GERD 09/17/2007  . ACNE, MILD 09/17/2007   Past Medical History  Diagnosis Date  . Depression   . HTN (hypertension)   . Hyperlipemia   . Allergy   . GERD (gastroesophageal reflux disease)   . Neuromuscular disorder   . Anemia   . Anxiety   . Asthma   . Shock therapy as cause of abnormal reaction of patient or of later complication without mention of  misadventure at time of procedure   . History of short term memory loss    Past Surgical History  Procedure Laterality Date  . Cholecystectomy    . Abdominal hysterectomy      partial  . Colonoscopy     Allergies  Allergen Reactions  . Adhesive [Tape]     This is tape as well as adhesive on bandaids.  . Gabapentin     Unknown reaction  . Ivp Dye [Iodinated Diagnostic Agents] Swelling  . Propoxyphene Hcl Other (See Comments)    hallucination  . Seroquel [Quetiapine Fumerate]     Unknown reaction  . Penicillins Rash  . Sulfonamide Derivatives Rash   Prior to Admission  medications   Medication Sig Start Date End Date Taking? Authorizing Provider  albuterol (PROVENTIL HFA;VENTOLIN HFA) 108 (90 BASE) MCG/ACT inhaler Inhale 2 puffs into the lungs every 6 (six) hours as needed for wheezing or shortness of breath. 09/10/13  Yes [name], MD  ALPRAZolam Duanne Moron) 1 MG tablet Take 0.5 mg by mouth daily as needed for anxiety.   Yes Historical Provider, MD  ALPRAZOLAM XR 1 MG 24 hr tablet Take 3 mg by mouth at bedtime.  08/10/11  Yes Historical Provider, MD  aspirin 325 MG tablet Take 1 tablet (325 mg total) by mouth daily. 10/22/13  Yes Langston Masker, MD  atorvastatin (LIPITOR) 40 MG tablet Take 1 tablet (40 mg total) by mouth daily at 6 PM. 10/22/13  Yes Langston Masker, MD  dexlansoprazole (DEXILANT) 60 MG capsule Take 1 capsule (60 mg total) by mouth daily. 06/04/13  Yes [name], MD  diclofenac sodium (VOLTAREN) 1 % GEL Apply 2 g topically daily as needed (for pain).   Yes Historical Provider, MD  doxycycline (VIBRA-TABS) 100 MG tablet Take 100 mg by mouth daily.   Yes Historical Provider, MD  hydrocortisone (ANUSOL-HC) 2.5 % rectal cream Place 1 application rectally 2 (two) times daily as needed for hemorrhoids or itching.   Yes Historical Provider, MD  losartan-hydrochlorothiazide (HYZAAR) 50-12.5 MG per tablet Take 1 tablet by mouth daily. 09/10/13  Yes [name], MD  LYSINE PO Take 1 tablet by mouth 2 (two) times daily.   Yes Historical Provider, MD  Oxcarbazepine (TRILEPTAL) 300 MG tablet Take 300-600 mg by mouth daily.    Yes Historical Provider, MD  temazepam (RESTORIL) 15 MG capsule Take 45 mg by mouth at bedtime.   Yes Historical Provider, MD  Vilazodone HCl 20 MG TABS Take 1 tablet by mouth daily.   Yes Historical Provider, MD   History   Social History  . Marital Status: Divorced    Spouse Name: N/A    Number of Children: N/A  . Years of Education: N/A   Occupational History  . Not on file.   Social History Main Topics  . Smoking  status: Never Smoker   . Smokeless tobacco: Never Used  . Alcohol Use: No  . Drug Use: No  . Sexual Activity: Yes    Birth Control/ Protection: None   Other Topics Concern  . Not on file   Social History Narrative   Lives with boyfriend. Walks daily for 20 minutes. Education: [employer].    Review of Systems     Objective:   Physical Exam  Vitals reviewed. Constitutional: She is oriented to person, place, and time. She appears well-developed and well-nourished.  HENT:  Head: Normocephalic and atraumatic.  No rash on scalp neck or face.  Eyes: Conjunctivae are normal. Pupils are equal,  round, and reactive to light. Right eye exhibits nystagmus ( 1 beat bilaterally to right ). Right eye exhibits normal extraocular motion. Left eye exhibits nystagmus (  1 beat bilaterally to right ). Left eye exhibits normal extraocular motion.  Neck: Carotid bruit is not present.  Cardiovascular: Normal rate, regular rhythm, normal heart sounds and intact distal pulses.  Exam reveals no gallop and no friction rub.   No murmur heard. Pulmonary/Chest: Effort normal and breath sounds normal.  Abdominal: Soft. She exhibits no pulsatile midline mass. There is no tenderness.  Musculoskeletal:  Equal grip strength bilaterally. Equal facial movement No pronator drift  Arm equal temp  cap refill 1+ at finger  Radial pulse 2+ minimal unsteady with heel toe Negative Romberg's  Neurological: She is alert and oriented to person, place, and time.  Skin: Skin is warm and dry.  Psychiatric: She has a normal mood and affect. Her behavior is normal.      Assessment & Plan:  Tamara [name] is a 52 y.o. female Blurry vision, left eye  Headache(784.0)  Cold sensation of skin  Subacute confusional state  Recent hospital eval for possible TIA. Reassuring MRI, CT head, echo, and min ICA plaque on dopplers. Now with increasing L sided HA and L face pain, confusion since yesterday, cold sensation in L arm.   Most recent new symptom of blurry vision in L eye over past few hours. nonfocal neuro exam and [place] prehospital scale negative, but with above symtoms including recurrence of hospital sx's and new confusion/blurry vision by report - EMS called for transport for ER eval.  Charge nurse advised, IV placed as below. No meds given in office. Takes ASA 325mg  qd. Advised to discuss statin concern with treating provider in hospital.   5:51 PM EMS called for transport. IV placed.  5:59 PM advised charge nurses at [facility] ED. 6:07 PM: report given to EMS, transfer of care.   Patient Instructions  We will have you evaluated at the emergency room again for the recurrence of some of the symptoms from your prior evaluation and now blurry vision and confusion.  Make sure to discuss with them the problem taking stain medicine and options. Follow up after hospitalization with [place] as needed.

## 2013-10-26 DIAGNOSIS — F331 Major depressive disorder, recurrent, moderate: Secondary | ICD-10-CM | POA: Diagnosis not present

## 2013-10-26 NOTE — ED Provider Notes (Signed)
I saw and evaluated the patient, reviewed the resident's note and I agree with the findings and plan.   EKG Interpretation None      Pt with vague neurologic complaints ongoing for 5 days, admitted for same earlier this week with normal workup including TIA. Seen at [facility] today for followup and sent to the ED for recheck.   Charles B. Karle Starch, MD 10/26/13 [phone]

## 2013-10-28 ENCOUNTER — Ambulatory Visit (INDEPENDENT_AMBULATORY_CARE_PROVIDER_SITE_OTHER): Payer: Medicare Other | Admitting: Emergency Medicine

## 2013-10-28 VITALS — BP 110/80 | HR 90 | Temp 98.0°F | Resp 16 | Ht 62.2 in | Wt 180.2 lb

## 2013-10-28 DIAGNOSIS — E785 Hyperlipidemia, unspecified: Secondary | ICD-10-CM | POA: Diagnosis not present

## 2013-10-28 DIAGNOSIS — G459 Transient cerebral ischemic attack, unspecified: Secondary | ICD-10-CM | POA: Diagnosis not present

## 2013-10-28 DIAGNOSIS — M545 Low back pain, unspecified: Secondary | ICD-10-CM | POA: Diagnosis not present

## 2013-10-28 DIAGNOSIS — R413 Other amnesia: Secondary | ICD-10-CM

## 2013-10-28 MED ORDER — COLESEVELAM HCL 625 MG PO TABS: 625.0000 mg | ORAL_TABLET | Freq: Two times a day (BID) | ORAL | Status: DC

## 2013-10-28 NOTE — Progress Notes (Signed)
Subjective:    Patient ID: Tamara [name], female    DOB: [birth year], 53 y.o.   MRN: [id number] This chart was scribed for Arlyss Queen, MD by Terressa Koyanagi, ED Scribe. This patient was seen in room 9 and the patient's care was started at 9:10 AM.    PCP: [name], MD  HPI  HPI Comments: Tamara [name] is a 53 y.o. female who [facility]. Pt was last seen at the [facility] by Dr. Carlota Raspberry on 10/24/13 for follow-up of her recent hospital admittance for a stroke. Specifically, pt was admitted to the hospital on 10/21/13 for a stoke and discharged on 10/22/13. On 10/24/13 during her visit with Dr. Carlota Raspberry, pt complained of recurrence of some of her symptoms that led to the ED visit on 10/21/13; further, pt also complained of new symptoms including blurry vision and confusion. Thereafter, Dr. Carlota Raspberry sent pt to the ED for re-evaluation. Accordingly, pt presented and was treated at the ED on 10/24/13. The final diagnosis at the ED was HA and paresthesia. Specifically, the ED provider noted that no specific cause was found for pt's HA and did not appear life threatening or require hospitalization. However, pt was told by the ED provider that HAs may appear benign but then serious symptoms may develop. As such, the ED advised pt  to follow-up with her PCP and neurologist ASAP for a re-examination.  Pt now presents to the [facility] for follow-up. Pt reports she had a new medication change: an increase of her Trileptal from 300 to 900 mg. Pt reports she cut back her dosage to 300 mg this week (without consulting her physician) and reports she feels tired, and her head "does not feel quite right," and is having issues with her speech. Pt reports that she plans to see Dr. Ladona Horns in May 2015. Pt plans to see Dr. Krista Blue, a neurologist, regarding her symptoms as well. Pt reports she is not currently taking any cholesterol meds. Pt reports that  under her PCP's direction, she was taken off cholesterol meds last year. One month ago pt had a cholesterol of 277 with a LDL of 165 and triglyceride of 324.   Review of Systems  Constitutional: Negative for fatigue and unexpected weight change.  Eyes: Positive for visual disturbance.  Respiratory: Negative for chest tightness and shortness of breath.   Cardiovascular: Negative for chest pain, palpitations and leg swelling.  Gastrointestinal: Negative for abdominal pain and blood in stool.  Neurological: Positive for speech difficulty. Negative for dizziness, syncope and light-headedness.   Objective:  Physical Exam  CONSTITUTIONAL: Well developed/well nourished HEAD: Normocephalic/atraumatic EYES: EOMI/PERRL, mild, lateral nystagmus both eyes.  ENMT: Mucous membranes moist NECK: supple no meningeal signs SPINE:entire spine nontender CV: S1/S2 noted, no murmurs/rubs/gallops noted LUNGS: Lungs are clear to auscultation bilaterally, no apparent distress ABDOMEN: soft, nontender, no rebound or guarding GU:no cva tenderness NEURO: Pt is awake/alert, moves all extremitiesx4; dysarthria of speech EXTREMITIES: pulses normal, full ROM SKIN: warm, color normal PSYCH: no abnormalities of mood noted  Assessment & Plan:  DIAGNOSTIC STUDIES: Oxygen Saturation is 99% on RA, normal by my interpretation.    COORDINATION OF CARE: I have placed her on WelChol on a low dosage at one tablet twice a day. She will follow up with Dr. Gurney Maxin in 2 weeks and at that time have a repeat fasting lipid panel. She is going to notify Dr. Corinna Capra regarding her change  in medication to one Trileptal a day. She will also be referred to the neurologist for followup of her neurological symptoms..  I personally performed the services described in this documentation, which was scribed in my presence. The recorded information has been reviewed and is accurate.

## 2013-10-28 NOTE — Progress Notes (Deleted)
   Subjective:    Patient ID: Tamara [name], female    DOB: [birth year], 53 y.o.   MRN: [id number]  HPI 53 yr old female is here as follow up from hospitalization of TIA. She states that all test came back normal. She would like to discuss her medications as well. She was advised by psychiatrist to increase her Trileptal from 300 mg a day to 900 mg about three weeks ago. She dropped it back done to 300 mg a day. She states she has elevated cholesterol as well. She also needs a note regarding canceling her flight due to her health.     Review of Systems     Objective:   Physical Exam        Assessment & Plan:

## 2013-11-05 ENCOUNTER — Ambulatory Visit (INDEPENDENT_AMBULATORY_CARE_PROVIDER_SITE_OTHER): Payer: Medicare Other | Admitting: Neurology

## 2013-11-05 ENCOUNTER — Encounter (INDEPENDENT_AMBULATORY_CARE_PROVIDER_SITE_OTHER): Payer: Self-pay

## 2013-11-05 ENCOUNTER — Encounter: Payer: Self-pay | Admitting: Neurology

## 2013-11-05 VITALS — BP 117/79 | HR 102 | Ht 60.0 in | Wt 180.0 lb

## 2013-11-05 DIAGNOSIS — G43909 Migraine, unspecified, not intractable, without status migrainosus: Secondary | ICD-10-CM | POA: Diagnosis not present

## 2013-11-05 DIAGNOSIS — I6350 Cerebral infarction due to unspecified occlusion or stenosis of unspecified cerebral artery: Secondary | ICD-10-CM | POA: Diagnosis not present

## 2013-11-05 MED ORDER — RIZATRIPTAN BENZOATE 5 MG PO TBDP: 5.0000 mg | ORAL_TABLET | ORAL | Status: DC | PRN

## 2013-11-05 NOTE — Progress Notes (Signed)
PATIENT: Tamara [name] DOB: Sep 28, 1960  HISTORICAL  Alexandr Oehler is a 53 years old right-handed Caucasian female, referred by her primary care physician Dr. Ottis Stain and for evaluation of headaches, possible TIA episode  She was previously a patient of Dr. love, was evaluated in 2012, for memory trouble, formally her severe bouts of depression in 2011, which required multiple ECT treatments at [facility], she is currently under the care of her psychologist Dr. Marlou Starks, under polypharmacy treatment, including Trileptal, titrating dose from 300 mg tonight 100 mg daily also for recent worsening depression, is also taking Xanax as needed, Xanax extended-release 1 mg 3 tablets every night, temazepam 15 mg 3 tablets every night for insomnia, vybrid 20 mg daily  She had a history of migraine the past,  April 22nd 2015, she had acute onset of left facial, arm, leg numbness, feeling cold, lasting for one hour, this is followed by a gradually building up moderate to severe pounding holoacranial headaches, she was taken by her family to emergency room, MRI, MRA of the brain was normal,  Her headache got much worse the next day April 23, lasting 4 days, eventually relieved by sleeping, hydrocodone as needed  Ever since the event, she has daily mild sometimes exacerbated to a severe pounding headaches, was blurry vision,  She is tearful during today's interview, complaining of worsening depression for few months,  She denied previous history of stroke, coronary artery disease, has never tried triptan treatments,  Laboratory evaluation showed a normal CMP, CBC.   Per record, at 2012, normal TSH, CMP, B12 level, normal MRI of the brain,    REVIEW OF SYSTEMS: Full 14 system review of systems performed and notable only for weight gain, fatigue, chest pain, ringing ears, blurry vision, shortness of breath, cough, feeling cold, memory loss, confusion, headaches, numbness, weakness, slurred  speech, sleepiness, depression, anxiety, decreased energy, change in appetite, disinterested in activities, hallucinations, recent falls,  ALLERGIES: Allergies  Allergen Reactions  . Adhesive [Tape]     This is tape as well as adhesive on bandaids.  . Gabapentin     Unknown reaction  . Ivp Dye [Iodinated Diagnostic Agents] Swelling  . Propoxyphene Hcl Other (See Comments)    hallucination  . Seroquel [Quetiapine Fumerate]     Unknown reaction  . Statins     Arm pain  . Penicillins Rash  . Sulfonamide Derivatives Rash    HOME MEDICATIONS: Current Outpatient Prescriptions on File Prior to Visit  Medication Sig Dispense Refill  . albuterol (PROVENTIL HFA;VENTOLIN HFA) 108 (90 BASE) MCG/ACT inhaler Inhale 2 puffs into the lungs every 6 (six) hours as needed for wheezing or shortness of breath.  1 Inhaler  3  . ALPRAZolam (XANAX) 1 MG tablet Take 1 mg by mouth daily as needed for anxiety.       . ALPRAZOLAM XR 1 MG 24 hr tablet Take 3 mg by mouth at bedtime.       Marland Kitchen aspirin 325 MG tablet Take 1 tablet (325 mg total) by mouth daily.  30 tablet  0  . colesevelam (WELCHOL) 625 MG tablet Take 1 tablet (625 mg total) by mouth 2 (two) times daily with a meal.  60 tablet  11  . dexlansoprazole (DEXILANT) 60 MG capsule Take 1 capsule (60 mg total) by mouth daily.  90 capsule  3  . diclofenac sodium (VOLTAREN) 1 % GEL Apply 2 g topically daily as needed (for pain).      Marland Kitchen  doxycycline (VIBRA-TABS) 100 MG tablet Take 100 mg by mouth 2 (two) times daily.       Marland Kitchen losartan-hydrochlorothiazide (HYZAAR) 50-12.5 MG per tablet Take 1 tablet by mouth daily.  90 tablet  3  . LYSINE PO Take 1 tablet by mouth as needed. prn      . niacin 100 MG tablet Take 100 mg by mouth at bedtime.      . Oxcarbazepine (TRILEPTAL) 300 MG tablet Take 300 mg by mouth daily.       . temazepam (RESTORIL) 15 MG capsule Take 45 mg by mouth at bedtime.      . Vilazodone HCl 20 MG TABS Take 20 mg by mouth daily.        No  current facility-administered medications on file prior to visit.    PAST MEDICAL HISTORY: Past Medical History  Diagnosis Date  . Depression   . HTN (hypertension)   . Hyperlipemia   . Allergy   . GERD (gastroesophageal reflux disease)   . Neuromuscular disorder   . Anemia   . Anxiety   . Asthma   . Shock therapy as cause of abnormal reaction of patient or of later complication without mention of misadventure at time of procedure   . History of short term memory loss     PAST SURGICAL HISTORY: Past Surgical History  Procedure Laterality Date  . Cholecystectomy    . Abdominal hysterectomy      partial  . Colonoscopy      FAMILY HISTORY: Family History  Problem Relation Age of Onset  . Arrhythmia Father   . Stroke Father   . Hyperlipidemia Father   . Heart disease Father   . Hypertension Father   . Coronary artery disease Maternal Grandfather 40    Died 58  . Heart disease Maternal Grandfather   . Hypertension Maternal Grandfather   . Cancer Mother   . Hyperlipidemia Mother   . Hypertension Mother   . Hearing loss Paternal Grandmother   . Hearing loss Paternal Grandfather   . Diabetes Paternal Grandfather   . Diabetes Daughter   . Colon cancer Paternal Uncle   . Hyperlipidemia Brother   . Hyperlipidemia Maternal Grandmother     SOCIAL HISTORY:  History   Social History  . Marital Status: Divorced    Spouse Name: N/A    Number of Children: 3  . Years of Education: college   Occupational History    Disabled   Social History Main Topics  . Smoking status: Never Smoker   . Smokeless tobacco: Never Used  . Alcohol Use: No  . Drug Use: No  . Sexual Activity: Yes    Birth Control/ Protection: None   Other Topics Concern  . Not on file   Social History Narrative   Lives with boyfriend. Walks daily for 20 minutes. Education: [employer].     PHYSICAL EXAM   Filed Vitals:   11/05/13 1019  BP: 117/79  Pulse: 102  Height: 5' (1.524 m)  Weight:  180 lb (81.647 kg)    Not recorded    Body mass index is 35.15 kg/(m^2).   Generalized: In no acute distress  Neck: Supple, no carotid bruits   Cardiac: Regular rate rhythm  Pulmonary: Clear to auscultation bilaterally  Musculoskeletal: No deformity  Neurological examination  Mentation: Alert oriented to time, place, history taking, and causual conversation, tearful depressed-looking middle-aged female  Cranial nerve II-XII: Pupils were equal round reactive to light. Extraocular movements were full.  Visual field were full on confrontational test. Bilateral fundi were sharp.  Facial sensation and strength were normal. Hearing was intact to finger rubbing bilaterally. Uvula tongue midline.  Head turning and shoulder shrug and were normal and symmetric.Tongue protrusion into cheek strength was normal.  Motor: Normal tone, bulk and strength.  Sensory: Intact to fine touch, pinprick, preserved vibratory sensation, and proprioception at toes.  Coordination: Normal finger to nose, heel-to-shin bilaterally there was no truncal ataxia  Gait: Rising up from seated position without assistance, normal stance, without trunk ataxia, moderate stride, good arm swing, smooth turning, able to perform tiptoe, and heel walking without difficulty.   Romberg signs: Negative  Deep tendon reflexes: Brachioradialis 2/2, biceps 2/2, triceps 2/2, patellar 2/2, Achilles 2/2, plantar responses were flexor bilaterally.   DIAGNOSTIC DATA (LABS, IMAGING, TESTING) - I reviewed patient records, labs, notes, testing and imaging myself where available.  Lab Results  Component Value Date   WBC 8.3 10/22/2013   HGB 14.3 10/24/2013   HCT 42.0 10/24/2013   MCV 83.4 10/22/2013   PLT 221 10/22/2013      Component Value Date/Time   NA 136* 10/24/2013 2037   K 3.8 10/24/2013 2037   CL 98 10/24/2013 2037   CO2 23 10/22/2013 0528   GLUCOSE 94 10/24/2013 2037   BUN 16 10/24/2013 2037   CREATININE 1.00 10/24/2013 2037     CREATININE 0.84 09/10/2013 1215   CALCIUM 9.4 10/22/2013 0528   CALCIUM 11.8* 03/13/2010 2236   PROT 7.3 10/21/2013 2313   ALBUMIN 4.0 10/21/2013 2313   AST 19 10/21/2013 2313   ALT 18 10/21/2013 2313   ALKPHOS 94 10/21/2013 2313   BILITOT 0.4 10/21/2013 2313   GFRNONAA 83* 10/22/2013 0528   GFRNONAA 81 09/10/2013 1215   GFRAA >90 10/22/2013 0528   GFRAA >89 09/10/2013 1215   Lab Results  Component Value Date   CHOL 277* 09/10/2013   HDL 47 09/10/2013   LDLCALC 165* 09/10/2013   LDLDIRECT 94.2 01/12/2010   TRIG 324* 09/10/2013   CHOLHDL 5.9 09/10/2013   Lab Results  Component Value Date   HGBA1C 4.9 10/22/2013   No results found for this basename: VITAMINB12   Lab Results  Component Value Date   TSH 0.925 09/10/2013     ASSESSMENT AND PLAN  Elga Santy is a 53 y.o. female with history of major depression, now complaining worsening depression episodes, or history most consistent with complicated migraines, with normal MRI of the brain, MRA of the brain,  1, continue current medications, she is on polypharmacy already 2. Maxalt 5 mg as needed 3. Return to clinic with Hoyle Sauer in 2-3 months  Marcial Pacas, M.D. Ph.D.  [facility] [address] [place], [place] [address] [phone]

## 2013-11-09 DIAGNOSIS — F331 Major depressive disorder, recurrent, moderate: Secondary | ICD-10-CM | POA: Diagnosis not present

## 2013-11-11 DIAGNOSIS — F331 Major depressive disorder, recurrent, moderate: Secondary | ICD-10-CM | POA: Diagnosis not present

## 2013-11-16 DIAGNOSIS — F0390 Unspecified dementia without behavioral disturbance: Secondary | ICD-10-CM | POA: Diagnosis not present

## 2013-11-16 DIAGNOSIS — F0392 Unspecified dementia, unspecified severity, with psychotic disturbance: Secondary | ICD-10-CM | POA: Diagnosis not present

## 2013-11-17 DIAGNOSIS — F331 Major depressive disorder, recurrent, moderate: Secondary | ICD-10-CM | POA: Diagnosis not present

## 2013-11-19 ENCOUNTER — Other Ambulatory Visit: Payer: Self-pay | Admitting: Family Medicine

## 2013-11-19 ENCOUNTER — Encounter: Payer: Self-pay | Admitting: Family Medicine

## 2013-11-19 ENCOUNTER — Ambulatory Visit (INDEPENDENT_AMBULATORY_CARE_PROVIDER_SITE_OTHER): Payer: Medicare Other | Admitting: Family Medicine

## 2013-11-19 VITALS — BP 120/84 | HR 87 | Temp 98.2°F | Resp 16 | Ht 62.0 in | Wt 182.4 lb

## 2013-11-19 DIAGNOSIS — G459 Transient cerebral ischemic attack, unspecified: Secondary | ICD-10-CM | POA: Diagnosis not present

## 2013-11-19 DIAGNOSIS — E785 Hyperlipidemia, unspecified: Secondary | ICD-10-CM | POA: Diagnosis not present

## 2013-11-19 LAB — COMPREHENSIVE METABOLIC PANEL
ALT: 19 U/L (ref 0–35)
AST: 21 U/L (ref 0–37)
Albumin: 4.1 g/dL (ref 3.5–5.2)
Alkaline Phosphatase: 98 U/L (ref 39–117)
BUN: 15 mg/dL (ref 6–23)
CO2: 26 mEq/L (ref 19–32)
Calcium: 9.7 mg/dL (ref 8.4–10.5)
Chloride: 103 mEq/L (ref 96–112)
Creat: 0.86 mg/dL (ref 0.50–1.10)
Glucose, Bld: 92 mg/dL (ref 70–99)
Potassium: 3.9 mEq/L (ref 3.5–5.3)
Sodium: 138 mEq/L (ref 135–145)
Total Bilirubin: 1 mg/dL (ref 0.2–1.2)
Total Protein: 7.1 g/dL (ref 6.0–8.3)

## 2013-11-19 LAB — LIPID PANEL
Cholesterol: 241 mg/dL — ABNORMAL HIGH (ref 0–200)
HDL: 46 mg/dL (ref >39)
Total CHOL/HDL Ratio: 5.2 Ratio
Triglycerides: 455 mg/dL — ABNORMAL HIGH (ref <150)

## 2013-11-19 MED ORDER — FENOFIBRATE 145 MG PO TABS: 145.0000 mg | ORAL_TABLET | Freq: Every day | ORAL | Status: DC

## 2013-11-19 NOTE — Patient Instructions (Signed)
[facility], [phone]

## 2013-11-19 NOTE — Progress Notes (Addendum)
Patient ID: Tamara [name] MRN: [id number], DOB: Apr 28, 1961, 53 y.o. Date of Encounter: 11/19/2013, 9:27 AM  Primary Physician: [name], MD  Chief Complaint:  Chief Complaint  Patient presents with   Follow-up    TIA     HPI: 53 y.o. year old female with history below presents for a follow up for her TIA.   Today pt states that she was prescribed Welchol which she states that its too expensive for her so she wanted to talk to me about amending the prescription.   Pt is also complaining of associated migraines and she is requesting a referral to another neurologist because she does not agree with the medical treatment plan of her current neurologist.  Tamara [name] is still experiencing a cold feeling in her left arm. She states that recently experienced a cold feeling in her left arm that radiated down to her fingers and up the left sided of her face. Upon that occurrence she went to [facility] ED, while there she started to experience visual disturbances in her left eye. They were unable to find any pressing results on the imaging that was done. They diagnosed her with a suspected TIA. She states that she wasn't able to see her eye doctor yet.   Pt says she had a flight on 4/30 but was not able to fly due to her diagnosis. Requesting me to fill out an insurance for her previously scheduled flight.   Pt is concerned about her prescribed 100 MG Niacin.  Past Medical History  Diagnosis Date   Depression    HTN (hypertension)    Hyperlipemia    Allergy    GERD (gastroesophageal reflux disease)    Neuromuscular disorder    Anemia    Anxiety    Asthma    Shock therapy as cause of abnormal reaction of patient or of later complication without mention of misadventure at time of procedure    History of short term memory loss      Home Meds: Prior to Admission medications   Medication Sig Start Date End Date Taking? Authorizing Provider  albuterol (PROVENTIL  HFA;VENTOLIN HFA) 108 (90 BASE) MCG/ACT inhaler Inhale 2 puffs into the lungs every 6 (six) hours as needed for wheezing or shortness of breath. 09/10/13  Yes [name], MD  ALPRAZolam Duanne Moron) 1 MG tablet Take 1 mg by mouth daily as needed for anxiety.    Yes Historical Provider, MD  ALPRAZOLAM XR 1 MG 24 hr tablet Take 3 mg by mouth at bedtime.  08/10/11  Yes Historical Provider, MD  aspirin 325 MG tablet Take 1 tablet (325 mg total) by mouth daily. 10/22/13  Yes Langston Masker, MD  colesevelam [facility]) 625 MG tablet Take 1 tablet (625 mg total) by mouth 2 (two) times daily with a meal. 10/28/13  Yes Darlyne Russian, MD  dexlansoprazole (DEXILANT) 60 MG capsule Take 1 capsule (60 mg total) by mouth daily. 06/04/13  Yes [name], MD  doxycycline (VIBRA-TABS) 100 MG tablet Take 100 mg by mouth 2 (two) times daily.    Yes Historical Provider, MD  losartan-hydrochlorothiazide (HYZAAR) 50-12.5 MG per tablet Take 1 tablet by mouth daily. 09/10/13  Yes [name], MD  LYSINE PO Take 1 tablet by mouth as needed. prn   Yes Historical Provider, MD  niacin 100 MG tablet Take 100 mg by mouth at bedtime.   Yes Historical Provider, MD  Oxcarbazepine (TRILEPTAL) 300 MG tablet Take 300 mg by mouth daily.  Yes Historical Provider, MD  PRESCRIPTION MEDICATION Trilopan 4 mg prn bid taking   Yes Historical Provider, MD  PRESCRIPTION MEDICATION Trilofan 4 mg prn taking bid   Yes Historical Provider, MD  PRESCRIPTION MEDICATION Trilofan 4 mg taking bid prn   Yes Historical Provider, MD  rizatriptan (MAXALT-MLT) 5 MG disintegrating tablet Take 1 tablet (5 mg total) by mouth as needed for migraine. May repeat in 2 hours if needed 11/05/13  Yes Marcial Pacas, MD  temazepam (RESTORIL) 15 MG capsule Take 45 mg by mouth at bedtime.   Yes Historical Provider, MD  Vilazodone HCl 20 MG TABS Take 20 mg by mouth daily.    Yes Historical Provider, MD  diclofenac sodium (VOLTAREN) 1 % GEL Apply 2 g topically daily as needed  (for pain).    Historical Provider, MD    Allergies:  Allergies  Allergen Reactions   Adhesive [Tape]     This is tape as well as adhesive on bandaids.   Gabapentin     Unknown reaction   Ivp Dye [Iodinated Diagnostic Agents] Swelling   Propoxyphene Hcl Other (See Comments)    hallucination   Seroquel [Quetiapine Fumerate]     Unknown reaction   Statins     Arm pain   Penicillins Rash   Sulfonamide Derivatives Rash    History   Social History   Marital Status: Divorced    Spouse Name: N/A    Number of Children: 3   Years of Education: college   Occupational History        Disabled   Social History Main Topics   Smoking status: Never Smoker    Smokeless tobacco: Never Used   Alcohol Use: No   Drug Use: No   Sexual Activity: Yes    Museum/gallery curator: None   Other Topics Concern   Not on file   Social History Narrative   Lives with boyfriend. Walks daily for 20 minutes. Education: [employer].     Review of Systems: headaches Constitutional: negative for chills, fever, night sweats, weight changes, or fatigue  HEENT: negative for vision changes, hearing loss, congestion, rhinorrhea, ST, epistaxis, or sinus pressure Cardiovascular: negative for chest pain or palpitations Respiratory: negative for hemoptysis, wheezing, shortness of breath, or cough Abdominal: negative for abdominal pain, nausea, vomiting, diarrhea, or constipation Dermatological: negative for rash Neurologic: negative for headache, dizziness, or syncope All other systems reviewed and are otherwise negative with the exception to those above and in the HPI.   Physical Exam: Blood pressure 120/84, pulse 87, temperature 98.2 F (36.8 C), temperature source Oral, resp. rate 16, height 5\' 2"  (1.575 m), weight 182 lb 6.4 oz (82.736 kg), SpO2 98.00%., Body mass index is 33.35 kg/(m^2). General: Well developed, well nourished, in no acute distress. Head: Normocephalic, atraumatic,  eyes without discharge, sclera non-icteric, nares are without discharge. Bilateral auditory canals clear, TM's are without perforation, pearly grey and translucent with reflective cone of light bilaterally. Oral cavity moist, posterior pharynx without exudate, erythema, peritonsillar abscess, or post nasal drip.  Neck: Supple. No thyromegaly. Full ROM. No lymphadenopathy. Lungs: Clear bilaterally to auscultation without wheezes, rales, or rhonchi. Breathing is unlabored. Heart: RRR with S1 S2. No murmurs, rubs, or gallops appreciated. Abdomen: Soft, non-tender, non-distended with normoactive bowel sounds. No hepatomegaly. No rebound/guarding. No obvious abdominal masses. Msk:  Strength and tone normal for age. Extremities/Skin: Warm and dry. No clubbing or cyanosis. No edema. No rashes or suspicious lesions. Neuro: Alert and oriented X 3. Moves  all extremities spontaneously. Gait is normal. CNII-XII grossly in tact.  Reflexes are normal. Psych:  Responds to questions appropriately with a normal affect.   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 We also talked about her husband's multiple sclerosis and be best for him.  ASSESSMENT AND PLAN:  53 y.o. year old female with an atypical neurological syndrome consistent with TIA.  She has hyperlipidemia.  She's taking a subtherapeutic dose of niacin, is intolerant to statins, and has recently started [employer]. We'll find out how that's going with today's labs I think she would benefit from another neurologist evaluation. We may want to consider TriCor as an alternative WelChol since is so expensive.  I will refer her to Dr. Adair Patter and will be seeing her back in a month   I personally performed the services described in this documentation, which was scribed in my presence. The recorded information has been reviewed and is accurate.  Signed, [name], MD 11/19/2013 9:27 AM

## 2013-11-20 ENCOUNTER — Telehealth: Payer: Self-pay

## 2013-11-20 NOTE — Telephone Encounter (Signed)
Pt was recently prescribed new cholesterol medicine, but she would like to know if she can finish what she was taking previously before? Also would like to know if prescriptions can be mail ordered; please call  [phone]

## 2013-11-21 NOTE — Telephone Encounter (Signed)
Advised pt to start the Tricor and rtn to the clinic in one month per Dr. Lorita Officer. [employer] [facility] change in the system.

## 2013-11-24 DIAGNOSIS — F331 Major depressive disorder, recurrent, moderate: Secondary | ICD-10-CM | POA: Diagnosis not present

## 2013-11-30 DIAGNOSIS — F331 Major depressive disorder, recurrent, moderate: Secondary | ICD-10-CM | POA: Diagnosis not present

## 2013-12-09 DIAGNOSIS — F331 Major depressive disorder, recurrent, moderate: Secondary | ICD-10-CM | POA: Diagnosis not present

## 2013-12-24 ENCOUNTER — Ambulatory Visit: Payer: Medicare Other | Admitting: Family Medicine

## 2013-12-29 DIAGNOSIS — F331 Major depressive disorder, recurrent, moderate: Secondary | ICD-10-CM | POA: Diagnosis not present

## 2014-01-01 ENCOUNTER — Ambulatory Visit (INDEPENDENT_AMBULATORY_CARE_PROVIDER_SITE_OTHER): Payer: Medicare Other | Admitting: Family Medicine

## 2014-01-01 VITALS — BP 93/66 | HR 113 | Temp 98.5°F | Resp 18 | Ht 62.0 in | Wt 182.2 lb

## 2014-01-01 DIAGNOSIS — I6350 Cerebral infarction due to unspecified occlusion or stenosis of unspecified cerebral artery: Secondary | ICD-10-CM

## 2014-01-01 DIAGNOSIS — I10 Essential (primary) hypertension: Secondary | ICD-10-CM | POA: Diagnosis not present

## 2014-01-01 DIAGNOSIS — G459 Transient cerebral ischemic attack, unspecified: Secondary | ICD-10-CM | POA: Diagnosis not present

## 2014-01-01 DIAGNOSIS — J069 Acute upper respiratory infection, unspecified: Secondary | ICD-10-CM

## 2014-01-01 DIAGNOSIS — I639 Cerebral infarction, unspecified: Secondary | ICD-10-CM

## 2014-01-01 LAB — COMPREHENSIVE METABOLIC PANEL
ALT: 17 U/L (ref 0–35)
AST: 24 U/L (ref 0–37)
Albumin: 4.4 g/dL (ref 3.5–5.2)
Alkaline Phosphatase: 66 U/L (ref 39–117)
BILIRUBIN TOTAL: 1 mg/dL (ref 0.2–1.2)
BUN: 16 mg/dL (ref 6–23)
CO2: 23 meq/L (ref 19–32)
Calcium: 10 mg/dL (ref 8.4–10.5)
Chloride: 102 mEq/L (ref 96–112)
Creat: 1.06 mg/dL (ref 0.50–1.10)
GLUCOSE: 89 mg/dL (ref 70–99)
Potassium: 3.9 mEq/L (ref 3.5–5.3)
Sodium: 136 mEq/L (ref 135–145)
Total Protein: 7.4 g/dL (ref 6.0–8.3)

## 2014-01-01 LAB — CBC
HCT: 36.1 % (ref 36.0–46.0)
HEMOGLOBIN: 13.3 g/dL (ref 12.0–15.0)
MCH: 29.8 pg (ref 26.0–34.0)
MCHC: 36.8 g/dL — ABNORMAL HIGH (ref 30.0–36.0)
MCV: 80.8 fL (ref 78.0–100.0)
PLATELETS: 280 10*3/uL (ref 150–400)
RBC: 4.47 MIL/uL (ref 3.87–5.11)
RDW: 13.2 % (ref 11.5–15.5)
WBC: 9.8 10*3/uL (ref 4.0–10.5)

## 2014-01-01 MED ORDER — METHYLPREDNISOLONE ACETATE 80 MG/ML IJ SUSP
40.0000 mg | Freq: Once | INTRAMUSCULAR | Status: AC
  Administered 2014-01-01: 40 mg via INTRAMUSCULAR

## 2014-01-01 NOTE — Patient Instructions (Signed)
1.  CHECK BLOOD PRESSURE DAILY; HOLD LOSARTAN/HCTZ IF BLOOD PRESSURE IF BP<90/60.    Upper Respiratory Infection, Adult An upper respiratory infection (URI) is also sometimes known as the common cold. The upper respiratory tract includes the nose, sinuses, throat, trachea, and bronchi. Bronchi are the airways leading to the lungs. Most people improve within 1 week, but symptoms can last up to 2 weeks. A residual cough may last even longer.  CAUSES Many different viruses can infect the tissues lining the upper respiratory tract. The tissues become irritated and inflamed and often become very moist. Mucus production is also common. A cold is contagious. You can easily spread the virus to others by oral contact. This includes kissing, sharing a glass, coughing, or sneezing. Touching your mouth or nose and then touching a surface, which is then touched by another person, can also spread the virus. SYMPTOMS  Symptoms typically develop 1 to 3 days after you come in contact with a cold virus. Symptoms vary from person to person. They may include:  Runny nose.  Sneezing.  Nasal congestion.  Sinus irritation.  Sore throat.  Loss of voice (laryngitis).  Cough.  Fatigue.  Muscle aches.  Loss of appetite.  Headache.  Low-grade fever. DIAGNOSIS  You might diagnose your own cold based on familiar symptoms, since most people get a cold 2 to 3 times a year. Your caregiver can confirm this based on your exam. Most importantly, your caregiver can check that your symptoms are not due to another disease such as strep throat, sinusitis, pneumonia, asthma, or epiglottitis. Blood tests, throat tests, and X-rays are not necessary to diagnose a common cold, but they may sometimes be helpful in excluding other more serious diseases. Your caregiver will decide if any further tests are required. RISKS AND COMPLICATIONS  You may be at risk for a more severe case of the common cold if you smoke cigarettes, have  chronic heart disease (such as heart failure) or lung disease (such as asthma), or if you have a weakened immune system. The very young and very old are also at risk for more serious infections. Bacterial sinusitis, middle ear infections, and bacterial pneumonia can complicate the common cold. The common cold can worsen asthma and chronic obstructive pulmonary disease (COPD). Sometimes, these complications can require emergency medical care and may be life-threatening. PREVENTION  The best way to protect against getting a cold is to practice good hygiene. Avoid oral or hand contact with people with cold symptoms. Wash your hands often if contact occurs. There is no clear evidence that vitamin C, vitamin E, echinacea, or exercise reduces the chance of developing a cold. However, it is always recommended to get plenty of rest and practice good nutrition. TREATMENT  Treatment is directed at relieving symptoms. There is no cure. Antibiotics are not effective, because the infection is caused by a virus, not by bacteria. Treatment may include:  Increased fluid intake. Sports drinks offer valuable electrolytes, sugars, and fluids.  Breathing heated mist or steam (vaporizer or shower).  Eating chicken soup or other clear broths, and maintaining good nutrition.  Getting plenty of rest.  Using gargles or lozenges for comfort.  Controlling fevers with ibuprofen or acetaminophen as directed by your caregiver.  Increasing usage of your inhaler if you have asthma. Zinc gel and zinc lozenges, taken in the first 24 hours of the common cold, can shorten the duration and lessen the severity of symptoms. Pain medicines may help with fever, muscle aches, and throat  pain. A variety of non-prescription medicines are available to treat congestion and runny nose. Your caregiver can make recommendations and may suggest nasal or lung inhalers for other symptoms.  HOME CARE INSTRUCTIONS   Only take over-the-counter or  prescription medicines for pain, discomfort, or fever as directed by your caregiver.  Use a warm mist humidifier or inhale steam from a shower to increase air moisture. This may keep secretions moist and make it easier to breathe.  Drink enough water and fluids to keep your urine clear or pale yellow.  Rest as needed.  Return to work when your temperature has returned to normal or as your caregiver advises. You may need to stay home longer to avoid infecting others. You can also use a face mask and careful hand washing to prevent spread of the virus. SEEK MEDICAL CARE IF:   After the first few days, you feel you are getting worse rather than better.  You need your caregiver's advice about medicines to control symptoms.  You develop chills, worsening shortness of breath, or brown or red sputum. These may be signs of pneumonia.  You develop yellow or brown nasal discharge or pain in the face, especially when you bend forward. These may be signs of sinusitis.  You develop a fever, swollen neck glands, pain with swallowing, or white areas in the back of your throat. These may be signs of strep throat. SEEK IMMEDIATE MEDICAL CARE IF:   You have a fever.  You develop severe or persistent headache, ear pain, sinus pain, or chest pain.  You develop wheezing, a prolonged cough, cough up blood, or have a change in your usual mucus (if you have chronic lung disease).  You develop sore muscles or a stiff neck. Document Released: 12/12/2000 Document Revised: 09/10/2011 Document Reviewed: 10/20/2010 [facility] Patient Information 2015 [place], [place]. This information is not intended to replace advice given to you by your health care provider. Make sure you discuss any questions you have with your health care provider.

## 2014-01-01 NOTE — Progress Notes (Signed)
Patient ID: Tamara [name], female   DOB: 31-May-1961, 53 y.o.   MRN: [id number] This chart was scribed for Wardell Honour, MD by Jeanell Sparrow, ED Scribe. This patient was seen in Room 8 and the patient's care was started at 10:18 AM.   Subjective:    Patient ID: Tamara [name], female    DOB: 16-Jun-1961, 53 y.o.   MRN: [id number]  01/01/2014  URI   HPI HPI Comments: Tamara [name] is a 53 y.o. female who presents to the [facility] complaining of an URI that started two days ago.   She states that she has had a fever Tmax of 99 today. She states that she has had associated chills, cough, stuffy nose, and nasal congestion.   She states that she is now starting to develop a HA, but has not been having any HA recently. She denies any rhinorrhea, diaphoresis or sore throat. She states that she had a little diarrhea yesterday. She states that she takes Motrin for her chills. She also denies any nausea, or emesis.  She cannot tolerate antihistamines because they dry her out too much. Per patient, she develops these symptoms on average once per year; Dr. Joseph Art always administers Depomedrol with immediate improvement in symptoms; she is requesting DepoMedrol injection today.  Her husband recommended Mucinex but she was very reluctant to try it.   HTN: blood pressure low for patient today; she reports compliance with blood pressure medication, Losartan/HCTZ.  S/p TIA in past three months; pt denies changes to dose of antihypertensive medications during admission.  Denies fatigue, dizziness, SOB, diaphoresis.  She did start ASA during admission; she also started fenofibrate during admission.  Review of Systems  Constitutional: Positive for chills. Negative for fever, diaphoresis and fatigue.  HENT: Positive for congestion and postnasal drip. Negative for ear pain, rhinorrhea, sore throat and voice change.   Respiratory: Positive for cough. Negative for shortness of breath  and stridor.   Cardiovascular: Negative for chest pain, palpitations and leg swelling.  Gastrointestinal: Positive for diarrhea. Negative for nausea and vomiting.  Neurological: Negative for dizziness, light-headedness and headaches.    Past Medical History  Diagnosis Date   Depression    HTN (hypertension)    Hyperlipemia    Allergy    GERD (gastroesophageal reflux disease)    Neuromuscular disorder    Anemia    Anxiety    Asthma    Shock therapy as cause of abnormal reaction of patient or of later complication without mention of misadventure at time of procedure    History of short term memory loss    Past Surgical History  Procedure Laterality Date   Cholecystectomy     Abdominal hysterectomy      partial   Colonoscopy      Allergies  Allergen Reactions   Adhesive [Tape]     This is tape as well as adhesive on bandaids.   Gabapentin     Unknown reaction   Ivp Dye [Iodinated Diagnostic Agents] Swelling   Propoxyphene Hcl Other (See Comments)    hallucination   Seroquel [Quetiapine Fumerate]     Unknown reaction   Statins     Arm pain   Penicillins Rash   Sulfonamide Derivatives Rash   Current Outpatient Prescriptions  Medication Sig Dispense Refill   albuterol (PROVENTIL HFA;VENTOLIN HFA) 108 (90 BASE) MCG/ACT inhaler Inhale 2 puffs into the lungs every 6 (six) hours as needed for wheezing or shortness of breath.  1 Inhaler  3   ALPRAZolam (XANAX) 1 MG tablet Take 1 mg by mouth daily as needed for anxiety.        ALPRAZOLAM XR 1 MG 24 hr tablet Take 3 mg by mouth at bedtime.        aspirin 325 MG tablet Take 1 tablet (325 mg total) by mouth daily.  30 tablet  0   colesevelam (WELCHOL) 625 MG tablet Take 1 tablet (625 mg total) by mouth 2 (two) times daily with a meal.  60 tablet  11   dexlansoprazole (DEXILANT) 60 MG capsule Take 1 capsule (60 mg total) by mouth daily.  90 capsule  3   diclofenac sodium (VOLTAREN) 1 % GEL Apply 2 g  topically daily as needed (for pain).       doxycycline (VIBRA-TABS) 100 MG tablet Take 100 mg by mouth 2 (two) times daily.        fenofibrate (TRICOR) 145 MG tablet Take 1 tablet (145 mg total) by mouth daily.  30 tablet  3   losartan-hydrochlorothiazide (HYZAAR) 50-12.5 MG per tablet Take 1 tablet by mouth daily.  90 tablet  3   LYSINE PO Take 1 tablet by mouth as needed. prn       niacin 100 MG tablet Take 100 mg by mouth at bedtime.       Oxcarbazepine (TRILEPTAL) 300 MG tablet Take 300 mg by mouth daily.        PRESCRIPTION MEDICATION Trilopan 4 mg prn bid taking       PRESCRIPTION MEDICATION Trilofan 4 mg prn taking bid       PRESCRIPTION MEDICATION Trilofan 4 mg taking bid prn       rizatriptan (MAXALT-MLT) 5 MG disintegrating tablet Take 1 tablet (5 mg total) by mouth as needed for migraine. May repeat in 2 hours if needed  10 tablet  6   temazepam (RESTORIL) 15 MG capsule Take 45 mg by mouth at bedtime.       Vilazodone HCl 20 MG TABS Take 20 mg by mouth daily.        No current facility-administered medications for this visit.   History   Social History   Marital Status: Divorced    Spouse Name: N/A    Number of Children: 3   Years of Education: college   Occupational History        Disabled   Social History Main Topics   Smoking status: Never Smoker    Smokeless tobacco: Never Used   Alcohol Use: No   Drug Use: No   Sexual Activity: Yes    Museum/gallery curator: None   Other Topics Concern   Not on file   Social History Narrative   Lives with boyfriend. Walks daily for 20 minutes. Education: [employer].       Objective:    BP 93/66   Pulse 113   Temp(Src) 98.5 F (36.9 C) (Oral)   Resp 18   Ht 5\' 2"  (1.575 m)   Wt 182 lb 3.2 oz (82.645 kg)   BMI 33.32 kg/m2   SpO2 99% Physical Exam  Nursing note and vitals reviewed. Constitutional: She is oriented to person, place, and time. She appears well-developed and well-nourished. No distress.   HENT:  Head: Normocephalic and atraumatic.  Right Ear: External ear normal.  Left Ear: External ear normal.  Nose: Nose normal.  Mouth/Throat: Oropharynx is clear and moist. No oropharyngeal exudate.  Eyes: Conjunctivae and EOM are normal. Pupils are equal,  round, and reactive to light.  Neck: Normal range of motion. Neck supple. No tracheal deviation present.  Cardiovascular: Normal rate, regular rhythm, normal heart sounds and intact distal pulses.  Exam reveals no gallop and no friction rub.   No murmur heard. Pulmonary/Chest: Effort normal and breath sounds normal. No respiratory distress. She has no wheezes. She has no rales.  Musculoskeletal: Normal range of motion.  Lymphadenopathy:    She has cervical adenopathy.  Neurological: She is alert and oriented to person, place, and time.  Skin: Skin is warm and dry. She is not diaphoretic.  Psychiatric: She has a normal mood and affect. Her behavior is normal.   Results for orders placed in visit on 11/19/13  COMPREHENSIVE METABOLIC PANEL      Result Value Ref Range   Sodium 138  135 - 145 mEq/L   Potassium 3.9  3.5 - 5.3 mEq/L   Chloride 103  96 - 112 mEq/L   CO2 26  19 - 32 mEq/L   Glucose, Bld 92  70 - 99 mg/dL   BUN 15  6 - 23 mg/dL   Creat 0.86  0.50 - 1.10 mg/dL   Total Bilirubin 1.0  0.2 - 1.2 mg/dL   Alkaline Phosphatase 98  39 - 117 U/L   AST 21  0 - 37 U/L   ALT 19  0 - 35 U/L   Total Protein 7.1  6.0 - 8.3 g/dL   Albumin 4.1  3.5 - 5.2 g/dL   Calcium 9.7  8.4 - 10.5 mg/dL  LIPID PANEL      Result Value Ref Range   Cholesterol 241 (*) 0 - 200 mg/dL   Triglycerides 455 (*) <150 mg/dL   HDL 46  >39 mg/dL   Total CHOL/HDL Ratio 5.2     VLDL NOT CALC  0 - 40 mg/dL   LDL Cholesterol NOT CALC  0 - 99 mg/dL       Assessment & Plan:  Acute upper respiratory infections of unspecified site - Plan: CBC, Comprehensive metabolic panel, methylPREDNISolone acetate (DEPO-MEDROL) injection 40 mg  Essential hypertension,  benign  Stroke   1. URI:  New. Onset in past two days; patient requesting Depomedrol injection as this is her reported treatment with acute infections; s/p Depomedrol 40mg  IM in office.  Symptoms consistent with viral etiology; obtain CBC.  If elevated WBC, will add abx.  Recommend nasal saline spray bid to tid.  Pt declined rx for cough medication.  2.  HTN: borderline low readings in office today.  Advised to check BP daily for one week; advised to HOLD medication if BP<90/60. 3.  Recent TIA/CVA:  Stable; continue ASA therapy.    Meds ordered this encounter  Medications   methylPREDNISolone acetate (DEPO-MEDROL) injection 40 mg    Sig:     No Follow-up on file.  I personally performed the services described in this documentation, which was scribed in my presence.  The recorded information has been reviewed and is accurate.  Reginia Forts, M.D.  [facility] [address] [place], [place]  [phone] phone [phone] fax

## 2014-01-02 ENCOUNTER — Telehealth: Payer: Self-pay

## 2014-01-02 NOTE — Telephone Encounter (Signed)
PATIENT STATES SHE SAW DR. [name] FOR A COLD ON Friday. DR. [name] ASKED HER IF SHE WANTED A PRESCRIPTION FOR HER COUGH AND SHE SAID NO. NOW SHE DOES NEED SOMETHING CALLED IN FOR A PRODUCTIVE COUGH WITH YELLOW MUCOUS. BEST PHONE [phone] (CELL)  PHARMACY CHOICE IS [facility] ON [address].  [place]

## 2014-01-03 ENCOUNTER — Telehealth: Payer: Self-pay

## 2014-01-03 MED ORDER — BENZONATATE 100 MG PO CAPS: 100.0000 mg | ORAL_CAPSULE | Freq: Three times a day (TID) | ORAL | Status: DC | PRN

## 2014-01-03 NOTE — Telephone Encounter (Signed)
Spoke with Chelle. Advised pt to check with pharm to see if Tessalon Perles 200mg  might be cheaper. Otherwise, get Delsym, Mucinex DM and stay well hydrated. Pt ok with this and will CB if pharm tells her 200mg  will be cheaper.

## 2014-01-03 NOTE — Telephone Encounter (Signed)
See other phone message  

## 2014-01-03 NOTE — Telephone Encounter (Signed)
1. URI: New. Onset in past two days; patient requesting Depomedrol injection as this is her reported treatment with acute infections; s/p Depomedrol 40mg  IM in office. Symptoms consistent with viral etiology; obtain CBC. If elevated WBC, will add abx. Recommend nasal saline spray bid to tid. Pt declined rx for cough medication.   Please advise on Rx for cough meds.

## 2014-01-03 NOTE — Telephone Encounter (Signed)
Hydration.  Mucinex Maximum Strength (not -D).  Meds ordered this encounter  Medications  . benzonatate (TESSALON) 100 MG capsule    Sig: Take 1-2 capsules (100-200 mg total) by mouth 3 (three) times daily as needed for cough.    Dispense:  40 capsule    Refill:  0    Order Specific Question:  Supervising Provider    Answer:  DOOLITTLE, ROBERT P [[id number]

## 2014-01-03 NOTE — Telephone Encounter (Signed)
Pt states that the cough medication we called in yesterday is not covered on her insurance and is over 30 dollars and would like something else called in please   Best number [phone]

## 2014-01-18 ENCOUNTER — Ambulatory Visit (INDEPENDENT_AMBULATORY_CARE_PROVIDER_SITE_OTHER): Payer: Medicare Other | Admitting: Family Medicine

## 2014-01-18 ENCOUNTER — Encounter: Payer: Self-pay | Admitting: Family Medicine

## 2014-01-18 ENCOUNTER — Ambulatory Visit (INDEPENDENT_AMBULATORY_CARE_PROVIDER_SITE_OTHER): Payer: Medicare Other

## 2014-01-18 VITALS — BP 103/73 | HR 88 | Temp 98.4°F | Resp 16 | Ht 63.0 in | Wt 183.0 lb

## 2014-01-18 DIAGNOSIS — R05 Cough: Secondary | ICD-10-CM

## 2014-01-18 DIAGNOSIS — E785 Hyperlipidemia, unspecified: Secondary | ICD-10-CM

## 2014-01-18 DIAGNOSIS — G459 Transient cerebral ischemic attack, unspecified: Secondary | ICD-10-CM

## 2014-01-18 DIAGNOSIS — R059 Cough, unspecified: Secondary | ICD-10-CM | POA: Diagnosis not present

## 2014-01-18 NOTE — Progress Notes (Signed)
Patient ID: Tamara [name] MRN: [id number], DOB: [birth year], 53 y.o. Date of Encounter: 01/18/2014, 11:00 AM  This chart was scribed for Tamara Haber, MD by Cathie Hoops, ED Scribe. The patient was seen in Room 25. The patient's care was started at 11:00 AM.   Primary Physician: Tamara Haber, MD  Chief Complaint: Cough  HPI: 53 y.o. year old female with history below presents with new, intermittent moderate cough onset 3 months ago. Pt states she came in the [facility] and saw Dr. Tamala [name]. Pt states Dr. Tamala [name] stated she had low BP. Pt is currently monitoring her BP. Pt states she was having crud and was given a Depo shot.   On October 20, 2013 Pt states she was SOB and went to the ED. Pt states she received EPI shot and a breathing treatment and a cough lingering. Pt states she uses an inhaler with absolutely necessary and finds some relief. Pt states when she coughs it starts up her asthma. Pt is currently taking Doxycyline once a day for acne.   Lives with boyfriend. Walks daily for 20 minutes. Pt states she goes to [place] for vacation annually. Pt states she is gaining weight, but she is monitoring it. Pt's mother has cancer.  Past Medical History  Diagnosis Date  . Depression   . HTN (hypertension)   . Hyperlipemia   . Allergy   . GERD (gastroesophageal reflux disease)   . Neuromuscular disorder   . Anemia   . Anxiety   . Asthma   . Shock therapy as cause of abnormal reaction of patient or of later complication without mention of misadventure at time of procedure   . History of short term memory loss      Home Meds: Prior to Admission medications   Medication Sig Start Date End Date Taking? Authorizing Provider  albuterol (PROVENTIL HFA;VENTOLIN HFA) 108 (90 BASE) MCG/ACT inhaler Inhale 2 puffs into the lungs every 6 (six) hours as needed for wheezing or shortness of breath. 09/10/13   Tamara Haber, MD  ALPRAZolam Duanne Moron) 1 MG tablet Take 1 mg by mouth daily as needed  for anxiety.     Historical Provider, MD  ALPRAZOLAM XR 1 MG 24 hr tablet Take 3 mg by mouth at bedtime.  08/10/11   Historical Provider, MD  aspirin 325 MG tablet Take 1 tablet (325 mg total) by mouth daily. 10/22/13   Langston Masker, MD  benzonatate (TESSALON) 100 MG capsule Take 1-2 capsules (100-200 mg total) by mouth 3 (three) times daily as needed for cough. 01/03/14   Chelle Janalee Dane, PA-C  colesevelam (WELCHOL) 625 MG tablet Take 1 tablet (625 mg total) by mouth 2 (two) times daily with a meal. 10/28/13   Darlyne Russian, MD  dexlansoprazole (DEXILANT) 60 MG capsule Take 1 capsule (60 mg total) by mouth daily. 06/04/13   Tamara Haber, MD  diclofenac sodium (VOLTAREN) 1 % GEL Apply 2 g topically daily as needed (for pain).    Historical Provider, MD  doxycycline (VIBRA-TABS) 100 MG tablet Take 100 mg by mouth 2 (two) times daily.     Historical Provider, MD  fenofibrate (TRICOR) 145 MG tablet Take 1 tablet (145 mg total) by mouth daily. 11/19/13   Tamara Haber, MD  losartan-hydrochlorothiazide (HYZAAR) 50-12.5 MG per tablet Take 1 tablet by mouth daily. 09/10/13   Tamara Haber, MD  LYSINE PO Take 1 tablet by mouth as needed. prn    Historical Provider, MD  niacin 100 MG tablet Take  100 mg by mouth at bedtime.    Historical Provider, MD  Oxcarbazepine (TRILEPTAL) 300 MG tablet Take 300 mg by mouth daily.     Historical Provider, MD  PRESCRIPTION MEDICATION Trilopan 4 mg prn bid taking    Historical Provider, MD  PRESCRIPTION MEDICATION Trilofan 4 mg prn taking bid    Historical Provider, MD  PRESCRIPTION MEDICATION Trilofan 4 mg taking bid prn    Historical Provider, MD  rizatriptan (MAXALT-MLT) 5 MG disintegrating tablet Take 1 tablet (5 mg total) by mouth as needed for migraine. May repeat in 2 hours if needed 11/05/13   Marcial Pacas, MD  temazepam (RESTORIL) 15 MG capsule Take 45 mg by mouth at bedtime.    Historical Provider, MD  Vilazodone HCl 20 MG TABS Take 20 mg by mouth daily.      Historical Provider, MD    Allergies:  Allergies  Allergen Reactions  . Adhesive [Tape]     This is tape as well as adhesive on bandaids.  . Gabapentin     Unknown reaction  . Ivp Dye [Iodinated Diagnostic Agents] Swelling  . Propoxyphene Hcl Other (See Comments)    hallucination  . Seroquel [Quetiapine Fumerate]     Unknown reaction  . Statins     Arm pain  . Penicillins Rash  . Sulfonamide Derivatives Rash    History   Social History  . Marital Status: Divorced    Spouse Name: N/A    Number of Children: 3  . Years of Education: college   Occupational History  .      Disabled   Social History Main Topics  . Smoking status: Never Smoker   . Smokeless tobacco: Never Used  . Alcohol Use: No  . Drug Use: No  . Sexual Activity: Yes    Birth Control/ Protection: None   Other Topics Concern  . Not on file   Social History Narrative   Lives with boyfriend. Walks daily for 20 minutes. Education: [employer].     Review of Systems: Constitutional: negative for chills, fever, night sweats, or fatigue. Weight changes (gain). HEENT: negative for vision changes, hearing loss, congestion, rhinorrhea, ST, epistaxis, or sinus pressure Cardiovascular: negative for chest pain or palpitations Respiratory: negative for hemoptysis, wheezing. SOB and cough. Abdominal: negative for abdominal pain, nausea, vomiting, diarrhea, or constipation Dermatological: negative for rash Neurologic: negative for headache, dizziness, or syncope All other systems reviewed and are otherwise negative with the exception to those above and in the HPI.   Physical Exam: Triage Vitals: Blood pressure 103/73, pulse 88, temperature 98.4 F (36.9 C), temperature source Oral, resp. rate 16, height 5\' 3"  (1.6 m), weight 183 lb (83.008 kg), SpO2 98.00%., Body mass index is 32.43 kg/(m^2). General: Well developed, well nourished, in no acute distress. Head: Normocephalic, atraumatic, eyes without discharge,  sclera non-icteric, nares are without discharge. Bilateral auditory canals clear, TM's are without perforation, pearly grey and translucent with reflective cone of light bilaterally. Oral cavity moist, posterior pharynx without exudate, erythema, peritonsillar abscess, or post nasal drip.  Neck: Supple. No thyromegaly. Full ROM. No lymphadenopathy. Lungs: Clear bilaterally to auscultation without wheezes, rales, or rhonchi. Breathing is unlabored. Heart: RRR with S1 S2. No murmurs, rubs, or gallops appreciated. Abdomen: Soft, non-tender, non-distended with normoactive bowel sounds. No hepatomegaly. No rebound/guarding. No obvious abdominal masses. Msk:  Strength and tone normal for age. Extremities/Skin: Warm and dry. No clubbing or cyanosis. No edema. No rashes or suspicious lesions. Neuro: Alert and oriented X  3. Moves all extremities spontaneously. Gait is normal. CNII-XII grossly in tact. Psych:  Responds to questions appropriately with a normal affect.   [facility] reading (PRIMARY) by  Dr. Joseph Art:  CXR normal.     ASSESSMENT AND PLAN:  DIAGNOSTIC STUDIES: Oxygen Saturation is 98% on RA, normal by my interpretation.    COORDINATION OF CARE: 11:16 AM- Patient informed of current plan for treatment and evaluation and agrees with plan at this time.   53 y.o. year old female with Cough - Plan: DG Chest 2 View  Hyperlipidemia - Plan: Lipid panel, Comprehensive metabolic panel  Stop the losartan and call with symptom status in 5 days.   Signed, Tamara Haber, MD 01/18/2014 11:12 AM

## 2014-01-19 LAB — COMPREHENSIVE METABOLIC PANEL
ALT: 16 U/L (ref 0–35)
AST: 19 U/L (ref 0–37)
Albumin: 4.5 g/dL (ref 3.5–5.2)
Alkaline Phosphatase: 61 U/L (ref 39–117)
BUN: 21 mg/dL (ref 6–23)
CO2: 27 mEq/L (ref 19–32)
Calcium: 10 mg/dL (ref 8.4–10.5)
Chloride: 101 mEq/L (ref 96–112)
Creat: 1.01 mg/dL (ref 0.50–1.10)
Glucose, Bld: 91 mg/dL (ref 70–99)
Potassium: 3.8 mEq/L (ref 3.5–5.3)
Sodium: 137 mEq/L (ref 135–145)
Total Bilirubin: 0.9 mg/dL (ref 0.2–1.2)
Total Protein: 7.6 g/dL (ref 6.0–8.3)

## 2014-01-19 LAB — LIPID PANEL
Cholesterol: 240 mg/dL — ABNORMAL HIGH (ref 0–200)
HDL: 57 mg/dL (ref >39)
LDL Cholesterol: 154 mg/dL — ABNORMAL HIGH (ref 0–99)
Total CHOL/HDL Ratio: 4.2 Ratio
Triglycerides: 146 mg/dL (ref <150)
VLDL: 29 mg/dL (ref 0–40)

## 2014-01-20 ENCOUNTER — Telehealth: Payer: Self-pay

## 2014-01-20 DIAGNOSIS — L819 Disorder of pigmentation, unspecified: Secondary | ICD-10-CM | POA: Diagnosis not present

## 2014-01-20 DIAGNOSIS — D235 Other benign neoplasm of skin of trunk: Secondary | ICD-10-CM | POA: Diagnosis not present

## 2014-01-20 DIAGNOSIS — L821 Other seborrheic keratosis: Secondary | ICD-10-CM | POA: Diagnosis not present

## 2014-01-20 MED ORDER — FENOFIBRATE 145 MG PO TABS
145.0000 mg | ORAL_TABLET | Freq: Every day | ORAL | Status: DC
Start: 2014-01-20 — End: 2014-06-18

## 2014-01-20 NOTE — Telephone Encounter (Signed)
Sent in Rx RFs and notified pt.

## 2014-01-20 NOTE — Telephone Encounter (Signed)
Patient is requesting 90 day supply of fenofibrate (TRICOR) 145 MG tablet Be called into [facility]    [phone]

## 2014-01-26 DIAGNOSIS — F331 Major depressive disorder, recurrent, moderate: Secondary | ICD-10-CM | POA: Diagnosis not present

## 2014-02-02 DIAGNOSIS — F331 Major depressive disorder, recurrent, moderate: Secondary | ICD-10-CM | POA: Diagnosis not present

## 2014-02-04 DIAGNOSIS — F331 Major depressive disorder, recurrent, moderate: Secondary | ICD-10-CM | POA: Diagnosis not present

## 2014-02-05 ENCOUNTER — Ambulatory Visit: Payer: Medicare Other | Admitting: Nurse Practitioner

## 2014-02-08 ENCOUNTER — Ambulatory Visit: Payer: Medicare Other | Admitting: Family Medicine

## 2014-02-08 ENCOUNTER — Telehealth: Payer: Self-pay

## 2014-02-08 NOTE — Telephone Encounter (Signed)
PT STATES SHE WOULD LIKE TO GO BACK TO THE [facility] BECAUSE SHE IS SWELLING ALONG WITH HEADACHES, WANT TO TAKE THE [place] TO HELP WITH THE FLUIDS IN HER BODY PLEASE CALL [phone]

## 2014-02-08 NOTE — Telephone Encounter (Signed)
FYI  Pt states her BP has been running about 114/77 but she has been swelling her her feet and hands. She has been having a headache. She has not been monitoring her weight.  Advised pt to monitor BP and Weight daily and RTC on Sunday to see Dr. Joseph Art. Pt states that she has not had any change in her breathing from her normal baseline.

## 2014-02-19 ENCOUNTER — Encounter: Payer: Self-pay | Admitting: Nurse Practitioner

## 2014-02-22 DIAGNOSIS — F331 Major depressive disorder, recurrent, moderate: Secondary | ICD-10-CM | POA: Diagnosis not present

## 2014-02-28 ENCOUNTER — Telehealth: Payer: Self-pay

## 2014-02-28 DIAGNOSIS — J45909 Unspecified asthma, uncomplicated: Secondary | ICD-10-CM

## 2014-02-28 NOTE — Telephone Encounter (Signed)
The patient called about placing her Albuterol medication on [facility] mail order.  She said that her PCP is Dr. Joseph Art.  If needed, she can be reached at [phone].  Please advise.  Thank you.

## 2014-03-02 MED ORDER — ALBUTEROL SULFATE HFA 108 (90 BASE) MCG/ACT IN AERS: 2.0000 | INHALATION_SPRAY | Freq: Four times a day (QID) | RESPIRATORY_TRACT | Status: DC | PRN

## 2014-03-02 NOTE — Telephone Encounter (Signed)
Sent refill to [facility]- pt notified.

## 2014-03-10 DIAGNOSIS — F0392 Unspecified dementia, unspecified severity, with psychotic disturbance: Secondary | ICD-10-CM | POA: Diagnosis not present

## 2014-03-10 DIAGNOSIS — F0390 Unspecified dementia without behavioral disturbance: Secondary | ICD-10-CM | POA: Diagnosis not present

## 2014-03-11 DIAGNOSIS — F331 Major depressive disorder, recurrent, moderate: Secondary | ICD-10-CM | POA: Diagnosis not present

## 2014-03-15 DIAGNOSIS — F331 Major depressive disorder, recurrent, moderate: Secondary | ICD-10-CM | POA: Diagnosis not present

## 2014-03-16 ENCOUNTER — Telehealth: Payer: Self-pay

## 2014-03-16 DIAGNOSIS — G4700 Insomnia, unspecified: Secondary | ICD-10-CM

## 2014-03-16 MED ORDER — ALPRAZOLAM 1 MG PO TABS: 1.0000 mg | ORAL_TABLET | Freq: Every day | ORAL | Status: DC | PRN

## 2014-03-16 NOTE — Telephone Encounter (Signed)
Pt needs refill on ALPRAZolam (XANAX) 1 MG tablet [[id number]] , advised to allow 24-72 hours

## 2014-03-17 NOTE — Telephone Encounter (Signed)
LMOM on both #s to CB to advise which pharm she wants Rx faxed to. Rx is in my ready Rxs box.

## 2014-03-18 ENCOUNTER — Ambulatory Visit (INDEPENDENT_AMBULATORY_CARE_PROVIDER_SITE_OTHER): Payer: Medicare Other | Admitting: Emergency Medicine

## 2014-03-18 ENCOUNTER — Telehealth: Payer: Self-pay

## 2014-03-18 ENCOUNTER — Ambulatory Visit (HOSPITAL_COMMUNITY)
Admission: RE | Admit: 2014-03-18 | Discharge: 2014-03-18 | Disposition: A | Discharge status: Home / Self Care | Payer: Medicare Other | Source: Ambulatory Visit | Attending: Emergency Medicine | Admitting: Emergency Medicine

## 2014-03-18 ENCOUNTER — Other Ambulatory Visit: Payer: Self-pay | Admitting: Emergency Medicine

## 2014-03-18 ENCOUNTER — Encounter (HOSPITAL_COMMUNITY): Payer: Self-pay

## 2014-03-18 VITALS — BP 126/76 | HR 113 | Temp 99.8°F | Resp 17 | Ht 62.5 in | Wt 186.0 lb

## 2014-03-18 DIAGNOSIS — I7090 Unspecified atherosclerosis: Secondary | ICD-10-CM | POA: Diagnosis not present

## 2014-03-18 DIAGNOSIS — Z90710 Acquired absence of both cervix and uterus: Secondary | ICD-10-CM | POA: Insufficient documentation

## 2014-03-18 DIAGNOSIS — R509 Fever, unspecified: Secondary | ICD-10-CM | POA: Diagnosis not present

## 2014-03-18 DIAGNOSIS — K5730 Diverticulosis of large intestine without perforation or abscess without bleeding: Secondary | ICD-10-CM | POA: Diagnosis not present

## 2014-03-18 DIAGNOSIS — R935 Abnormal findings on diagnostic imaging of other abdominal regions, including retroperitoneum: Secondary | ICD-10-CM | POA: Insufficient documentation

## 2014-03-18 DIAGNOSIS — R1031 Right lower quadrant pain: Secondary | ICD-10-CM

## 2014-03-18 DIAGNOSIS — I6350 Cerebral infarction due to unspecified occlusion or stenosis of unspecified cerebral artery: Secondary | ICD-10-CM | POA: Diagnosis not present

## 2014-03-18 DIAGNOSIS — K449 Diaphragmatic hernia without obstruction or gangrene: Secondary | ICD-10-CM | POA: Insufficient documentation

## 2014-03-18 DIAGNOSIS — M25551 Pain in right hip: Secondary | ICD-10-CM

## 2014-03-18 DIAGNOSIS — G8929 Other chronic pain: Secondary | ICD-10-CM

## 2014-03-18 DIAGNOSIS — N200 Calculus of kidney: Secondary | ICD-10-CM | POA: Diagnosis not present

## 2014-03-18 DIAGNOSIS — K631 Perforation of intestine (nontraumatic): Secondary | ICD-10-CM

## 2014-03-18 DIAGNOSIS — M25559 Pain in unspecified hip: Secondary | ICD-10-CM | POA: Insufficient documentation

## 2014-03-18 DIAGNOSIS — Z9089 Acquired absence of other organs: Secondary | ICD-10-CM | POA: Diagnosis not present

## 2014-03-18 DIAGNOSIS — K5720 Diverticulitis of large intestine with perforation and abscess without bleeding: Secondary | ICD-10-CM

## 2014-03-18 DIAGNOSIS — K7689 Other specified diseases of liver: Secondary | ICD-10-CM | POA: Insufficient documentation

## 2014-03-18 DIAGNOSIS — K5732 Diverticulitis of large intestine without perforation or abscess without bleeding: Secondary | ICD-10-CM

## 2014-03-18 LAB — COMPREHENSIVE METABOLIC PANEL
ALT: 40 U/L — AB (ref 0–35)
AST: 49 U/L — ABNORMAL HIGH (ref 0–37)
Albumin: 4.5 g/dL (ref 3.5–5.2)
Alkaline Phosphatase: 71 U/L (ref 39–117)
BUN: 20 mg/dL (ref 6–23)
CALCIUM: 9.8 mg/dL (ref 8.4–10.5)
CHLORIDE: 101 meq/L (ref 96–112)
CO2: 24 meq/L (ref 19–32)
Creat: 1.16 mg/dL — ABNORMAL HIGH (ref 0.50–1.10)
Glucose, Bld: 106 mg/dL — ABNORMAL HIGH (ref 70–99)
Potassium: 3.9 mEq/L (ref 3.5–5.3)
SODIUM: 136 meq/L (ref 135–145)
Total Bilirubin: 1.8 mg/dL — ABNORMAL HIGH (ref 0.2–1.2)
Total Protein: 7.5 g/dL (ref 6.0–8.3)

## 2014-03-18 LAB — POCT UA - MICROSCOPIC ONLY
CRYSTALS, UR, HPF, POC: NEGATIVE
Casts, Ur, LPF, POC: NEGATIVE
Mucus, UA: NEGATIVE
Yeast, UA: NEGATIVE

## 2014-03-18 LAB — POCT CBC
GRANULOCYTE PERCENT: 80.7 % — AB (ref 37–80)
HEMATOCRIT: 42.5 % (ref 37.7–47.9)
Hemoglobin: 13.9 g/dL (ref 12.2–16.2)
Lymph, poc: 2 (ref 0.6–3.4)
MCH, POC: 27.9 pg (ref 27–31.2)
MCHC: 32.8 g/dL (ref 31.8–35.4)
MCV: 85.1 fL (ref 80–97)
MID (cbc): 1.5 — AB (ref 0–0.9)
MPV: 7.2 fL (ref 0–99.8)
POC GRANULOCYTE: 14.5 — AB (ref 2–6.9)
POC LYMPH %: 11.1 % (ref 10–50)
POC MID %: 8.2 %M (ref 0–12)
Platelet Count, POC: 288 10*3/uL (ref 142–424)
RBC: 4.99 M/uL (ref 4.04–5.48)
RDW, POC: 13 %
WBC: 18 10*3/uL — AB (ref 4.6–10.2)

## 2014-03-18 LAB — POCT URINALYSIS DIPSTICK
BILIRUBIN UA: NEGATIVE
Glucose, UA: NEGATIVE
KETONES UA: NEGATIVE
LEUKOCYTES UA: NEGATIVE
Nitrite, UA: NEGATIVE
PH UA: 5
Protein, UA: NEGATIVE
SPEC GRAV UA: 1.02
Urobilinogen, UA: 0.2

## 2014-03-18 MED ORDER — DICLOFENAC SODIUM 1 % TD GEL: 2.0000 g | Freq: Every day | TRANSDERMAL | Status: DC | PRN

## 2014-03-18 MED ORDER — METRONIDAZOLE 500 MG PO TABS: 500.0000 mg | ORAL_TABLET | Freq: Four times a day (QID) | ORAL | Status: DC

## 2014-03-18 MED ORDER — CIPROFLOXACIN HCL 500 MG PO TABS: 500.0000 mg | ORAL_TABLET | Freq: Two times a day (BID) | ORAL | Status: DC

## 2014-03-18 NOTE — Telephone Encounter (Signed)
[facility] [address]   LV informing pt that Xanax is at the pharmacy.

## 2014-03-18 NOTE — Addendum Note (Signed)
Addended by: [name] on: 03/18/2014 04:52 PM   Modules accepted: Orders

## 2014-03-18 NOTE — Telephone Encounter (Signed)
Clld pt - advsd of CT scan results. Advsd two ABx: Cipro 500 mg 1 tablet oral twice daily and Flagyl 500 mg 1 tablet Oral four times daily were electronically sent to [facility]. Pt confirmed she understood.

## 2014-03-18 NOTE — Telephone Encounter (Signed)
Per physician, clld pt - LMOVM of cell to call back regarding results of her CT scan.

## 2014-03-18 NOTE — Telephone Encounter (Signed)
Pt called and says she did not want a refill on her Xanax. She wanted a refill on her Voltaren.   Okay to refill the Voltaren?

## 2014-03-18 NOTE — Progress Notes (Signed)
[facility] [address], [place] [address] [phone]  Date:  03/18/2014   Name:  Tamara [name]   DOB:  [birth year]   MRN:  [id number]  PCP:  [name], MD    Chief Complaint: Back Pain, Leg Pain, Fatigue and Headache   History of Present Illness:  Tamara [name] is a 53 y.o. very pleasant female patient who presents with the following:  Says sudden onset yesterday of fever and chills.  Measured temp to 101.5.  Has generalized malaise and myalgias. No cough or coryza or sore throat. Has four day history of headache that is on the crown Has no dysuria.  Frequency and urgency.  Has back pain on both sides worse on right. Says has muscle aches in legs and tingling feeling. No rash.  No ticks, pets. Nausea but no vomiting.  No diarrhea.  The patient has no complaint of blood, mucous, or pus in her stools.. No improvement with over the counter medications or other home remedies. Denies other complaint or health concern today.   Patient Active Problem List   Diagnosis Date Noted  . Migraine 11/05/2013  . Stroke 10/22/2013  . Disturbance of skin sensation 10/21/2013  . Abnormal EKG 08/31/2011  . ASTHMA 08/01/2010  . HEMORRHOIDS-EXTERNAL 05/30/2010  . ALLERGIC RHINITIS 04/14/2008  . HYPERLIPIDEMIA 09/17/2007  . OBESITY 09/17/2007  . ANXIETY 09/17/2007  . DEPRESSION 09/17/2007  . HYPERTENSION 09/17/2007  . GERD 09/17/2007  . ACNE, MILD 09/17/2007    Past Medical History  Diagnosis Date  . Depression   . HTN (hypertension)   . Hyperlipemia   . Allergy   . GERD (gastroesophageal reflux disease)   . Neuromuscular disorder   . Anemia   . Anxiety   . Asthma   . Shock therapy as cause of abnormal reaction of patient or of later complication without mention of misadventure at time of procedure   . History of short term memory loss     Past Surgical History  Procedure Laterality Date  . Cholecystectomy    . Abdominal hysterectomy       partial  . Colonoscopy      History  Substance Use Topics  . Smoking status: Never Smoker   . Smokeless tobacco: Never Used  . Alcohol Use: No    Family History  Problem Relation Age of Onset  . Arrhythmia Father   . Stroke Father   . Hyperlipidemia Father   . Heart disease Father   . Hypertension Father   . Coronary artery disease Maternal Grandfather 40    Died 58  . Heart disease Maternal Grandfather   . Hypertension Maternal Grandfather   . Cancer Mother   . Hyperlipidemia Mother   . Hypertension Mother   . Hearing loss Paternal Grandmother   . Hearing loss Paternal Grandfather   . Diabetes Paternal Grandfather   . Diabetes Daughter   . Colon cancer Paternal Uncle   . Hyperlipidemia Brother   . Hyperlipidemia Maternal Grandmother     Allergies  Allergen Reactions  . Adhesive [Tape]     This is tape as well as adhesive on bandaids.  . Gabapentin     Unknown reaction  . Ivp Dye [Iodinated Diagnostic Agents] Swelling  . Propoxyphene Hcl Other (See Comments)    hallucination  . Seroquel [Quetiapine Fumerate]     Unknown reaction  . Statins     Arm pain  . Penicillins Rash  . Sulfonamide Derivatives Rash  Medication list has been reviewed and updated.  Current Outpatient Prescriptions on File Prior to Visit  Medication Sig Dispense Refill  . albuterol (PROVENTIL HFA;VENTOLIN HFA) 108 (90 BASE) MCG/ACT inhaler Inhale 2 puffs into the lungs every 6 (six) hours as needed for wheezing or shortness of breath.  1 Inhaler  3  . ALPRAZolam (XANAX) 1 MG tablet Take 1 tablet (1 mg total) by mouth daily as needed for anxiety.  30 tablet  3  . ALPRAZOLAM XR 1 MG 24 hr tablet Take 3 mg by mouth at bedtime.       Marland Kitchen aspirin 325 MG tablet Take 1 tablet (325 mg total) by mouth daily.  30 tablet  0  . dexlansoprazole (DEXILANT) 60 MG capsule Take 1 capsule (60 mg total) by mouth daily.  90 capsule  3  . diclofenac sodium (VOLTAREN) 1 % GEL Apply 2 g topically daily as  needed (for pain).      Marland Kitchen doxycycline (VIBRA-TABS) 100 MG tablet Take 100 mg by mouth 2 (two) times daily.       . fenofibrate (TRICOR) 145 MG tablet Take 1 tablet (145 mg total) by mouth daily.  90 tablet  1  . LYSINE PO Take 1 tablet by mouth as needed. prn      . temazepam (RESTORIL) 15 MG capsule Take 45 mg by mouth at bedtime.      . Vilazodone HCl 20 MG TABS Take 20 mg by mouth daily.        No current facility-administered medications on file prior to visit.    Review of Systems:  As per HPI, otherwise negative.    Physical Examination: Filed Vitals:   03/18/14 1022  BP: 126/76  Pulse: 113  Temp: 99.8 F (37.7 C)  Resp: 17   Filed Vitals:   03/18/14 1022  Height: 5' 2.5" (1.588 m)  Weight: 186 lb (84.369 kg)   Body mass index is 33.46 kg/(m^2). Ideal Body Weight: Weight in (lb) to have BMI = 25: 138.6  GEN: WDWN, NAD, Non-toxic, A & O x 3 HEENT: Atraumatic, Normocephalic. Neck supple. No masses, No LAD. Ears and Nose: No external deformity. CV: RRR, No M/G/R. No JVD. No thrill. No extra heart sounds. PULM: CTA B, no wheezes, crackles, rhonchi. No retractions. No resp. distress. No accessory muscle use. ABD: S, localized RLQ tenderness, ND, +BS. No rebound. No HSM. EXTR: No c/c/e.  Full ROM right hip.  Negative psoas NEURO Normal gait.  PSYCH: Normally interactive. Conversant. Not depressed or anxious appearing.  Calm demeanor.  SKIN  No rash  Assessment and Plan: Fever Nausea RLQ and Right hip tenderness CT   Signed,  Ellison Carwin, MD   Results for orders placed in visit on 03/18/14  POCT CBC      Result Value Ref Range   WBC 18.0 (*) 4.6 - 10.2 K/uL   Lymph, poc 2.0  0.6 - 3.4   POC LYMPH PERCENT 11.1  10 - 50 %L   MID (cbc) 1.5 (*) 0 - 0.9   POC MID % 8.2  0 - 12 %M   POC Granulocyte 14.5 (*) 2 - 6.9   Granulocyte percent 80.7 (*) 37 - 80 %G   RBC 4.99  4.04 - 5.48 M/uL   Hemoglobin 13.9  12.2 - 16.2 g/dL   HCT, POC 42.5  37.7 - 47.9 %    MCV 85.1  80 - 97 fL   MCH, POC 27.9  27 - 31.2 pg  MCHC 32.8  31.8 - 35.4 g/dL   RDW, POC 13.0     Platelet Count, POC 288  142 - 424 K/uL   MPV 7.2  0 - 99.8 fL  POCT UA - MICROSCOPIC ONLY      Result Value Ref Range   WBC, Ur, HPF, POC 0-1     RBC, urine, microscopic 1-2     Bacteria, U Microscopic small     Mucus, UA neg     Epithelial cells, urine per micros 0-1     Crystals, Ur, HPF, POC neg     Casts, Ur, LPF, POC neg     Yeast, UA neg    POCT URINALYSIS DIPSTICK      Result Value Ref Range   Color, UA orange     Clarity, UA clear     Glucose, UA neg     Bilirubin, UA neg     Ketones, UA neg     Spec Grav, UA 1.020     Blood, UA trace-lysed     pH, UA 5.0     Protein, UA neg     Urobilinogen, UA 0.2     Nitrite, UA neg     Leukocytes, UA Negative

## 2014-03-18 NOTE — Telephone Encounter (Signed)
Patient wants Pamala Hurry to know that the pharmacy she uses [facility] [address]

## 2014-03-19 ENCOUNTER — Telehealth: Payer: Self-pay | Admitting: *Deleted

## 2014-03-19 ENCOUNTER — Emergency Department (HOSPITAL_COMMUNITY)
Admission: EM | Admit: 2014-03-19 | Discharge: 2014-03-19 | Discharge status: Against Medical Advice | Payer: Medicare Other | Attending: Emergency Medicine | Admitting: Emergency Medicine

## 2014-03-19 ENCOUNTER — Encounter (HOSPITAL_COMMUNITY): Payer: Self-pay | Admitting: Emergency Medicine

## 2014-03-19 DIAGNOSIS — J45909 Unspecified asthma, uncomplicated: Secondary | ICD-10-CM | POA: Diagnosis not present

## 2014-03-19 DIAGNOSIS — R109 Unspecified abdominal pain: Secondary | ICD-10-CM | POA: Insufficient documentation

## 2014-03-19 DIAGNOSIS — R509 Fever, unspecified: Secondary | ICD-10-CM | POA: Diagnosis not present

## 2014-03-19 DIAGNOSIS — I10 Essential (primary) hypertension: Secondary | ICD-10-CM | POA: Insufficient documentation

## 2014-03-19 DIAGNOSIS — R197 Diarrhea, unspecified: Secondary | ICD-10-CM | POA: Diagnosis not present

## 2014-03-19 LAB — URINALYSIS, ROUTINE W REFLEX MICROSCOPIC
Bilirubin Urine: NEGATIVE
Glucose, UA: NEGATIVE mg/dL
Hgb urine dipstick: NEGATIVE
KETONES UR: NEGATIVE mg/dL
Leukocytes, UA: NEGATIVE
NITRITE: NEGATIVE
PROTEIN: NEGATIVE mg/dL
Specific Gravity, Urine: 1.015 (ref 1.005–1.030)
UROBILINOGEN UA: 1 mg/dL (ref 0.0–1.0)
pH: 7 (ref 5.0–8.0)

## 2014-03-19 NOTE — ED Notes (Signed)
Pt presents with c/o [facility] at [facility] and was then sent to [facility] and diagnosed with diverticulitis. Pt reports right side abdominal pain and ever since drinking the barium for the CT she has had diarrhea. Also c/o fever at this time.

## 2014-03-19 NOTE — Telephone Encounter (Signed)
It appears that this patient has checked in to the ED at [facility].  If not, she needs re-evaluation. If so, proceed with their evaluation and recommendations.

## 2014-03-19 NOTE — Telephone Encounter (Signed)
Patient called to state she has had diarrhea since yesterday after she drank the barium for her CT.  She has diverticulitis per CT.  She is also in a lot of pain and has a fever.  She has been taking tylenol every 4 hours for pain and fever and she is able to keep her Cipro down.

## 2014-03-19 NOTE — Telephone Encounter (Signed)
Sent PA for Voltaren Gel to covermymeds.

## 2014-03-20 ENCOUNTER — Ambulatory Visit (INDEPENDENT_AMBULATORY_CARE_PROVIDER_SITE_OTHER): Payer: Medicare Other | Admitting: Family Medicine

## 2014-03-20 VITALS — BP 124/72 | HR 96 | Temp 98.3°F | Resp 18 | Ht 62.5 in | Wt 183.0 lb

## 2014-03-20 DIAGNOSIS — R509 Fever, unspecified: Secondary | ICD-10-CM | POA: Diagnosis not present

## 2014-03-20 DIAGNOSIS — R1031 Right lower quadrant pain: Secondary | ICD-10-CM

## 2014-03-20 DIAGNOSIS — I6350 Cerebral infarction due to unspecified occlusion or stenosis of unspecified cerebral artery: Secondary | ICD-10-CM | POA: Diagnosis not present

## 2014-03-20 DIAGNOSIS — R112 Nausea with vomiting, unspecified: Secondary | ICD-10-CM | POA: Diagnosis not present

## 2014-03-20 DIAGNOSIS — K5732 Diverticulitis of large intestine without perforation or abscess without bleeding: Secondary | ICD-10-CM | POA: Diagnosis not present

## 2014-03-20 DIAGNOSIS — R197 Diarrhea, unspecified: Secondary | ICD-10-CM

## 2014-03-20 LAB — POCT UA - MICROSCOPIC ONLY
Bacteria, U Microscopic: NEGATIVE
CRYSTALS, UR, HPF, POC: NEGATIVE
Casts, Ur, LPF, POC: NEGATIVE
Mucus, UA: NEGATIVE
RBC, urine, microscopic: NEGATIVE
YEAST UA: NEGATIVE

## 2014-03-20 LAB — POCT CBC
Granulocyte percent: 72 %G (ref 37–80)
HCT, POC: 41.5 % (ref 37.7–47.9)
Hemoglobin: 13.3 g/dL (ref 12.2–16.2)
Lymph, poc: 2.6 (ref 0.6–3.4)
MCH: 27.7 pg (ref 27–31.2)
MCHC: 32 g/dL (ref 31.8–35.4)
MCV: 86.4 fL (ref 80–97)
MID (cbc): 0.6 (ref 0–0.9)
MPV: 7.4 fL (ref 0–99.8)
POC Granulocyte: 8.1 — AB (ref 2–6.9)
POC LYMPH PERCENT: 23.1 %L (ref 10–50)
POC MID %: 4.9 % (ref 0–12)
Platelet Count, POC: 317 10*3/uL (ref 142–424)
RBC: 4.8 M/uL (ref 4.04–5.48)
RDW, POC: 13.5 %
WBC: 11.3 10*3/uL — AB (ref 4.6–10.2)

## 2014-03-20 LAB — POCT URINALYSIS DIPSTICK
Blood, UA: NEGATIVE
Glucose, UA: NEGATIVE
Nitrite, UA: POSITIVE
SPEC GRAV UA: 1.025
Urobilinogen, UA: 0.2
pH, UA: 5.5

## 2014-03-20 MED ORDER — ONDANSETRON 4 MG PO TBDP
4.0000 mg | ORAL_TABLET | Freq: Once | ORAL | Status: AC
  Administered 2014-03-20: 4 mg via ORAL

## 2014-03-20 MED ORDER — ONDANSETRON 4 MG PO TBDP: 4.0000 mg | ORAL_TABLET | Freq: Three times a day (TID) | ORAL | Status: DC | PRN

## 2014-03-20 MED ORDER — HYDROCODONE-ACETAMINOPHEN 5-325 MG PO TABS: 1.0000 | ORAL_TABLET | ORAL | Status: DC | PRN

## 2014-03-20 NOTE — Progress Notes (Signed)
Subjective:  Patient was here 2 days ago with abdominal pain. She had a white blood count of 18,000 and was sent for a CT scan.She had acute diverticulitis with evidence of microperforation. She was treated with Cipro and Flagyl. Yesterday she felt bad with a lot of pain and went to the emergency room at [facility]. She did not stay there however because of the long wait. She contacted a pharmacy friend who advise she take some of the Flexeril and hydrocodone that they had from her husband. She felt a little bit better. She continued to run a fever yesterday. She's able very low-grade fever today. She is still hurting and she came back in here. She's been nauseated and has had vomiting 3 times a day and diarrhea number of times today. She has kept down some liquids and is urinating.  Objective: Ill-appearing lady. Chest clear. Heart regular without murmurs. Abdomen soft. Quite tender in the right lower quadrant still. No rebound essentially. Maybe minimal rebound the was closer to the actual area but not true rebound. No left CVA tenderness but she is tender in the right side. Extremities normal. Skin normal.  Results for orders placed in visit on 03/20/14  POCT UA - MICROSCOPIC ONLY      Result Value Ref Range   WBC, Ur, HPF, POC 1-5     RBC, urine, microscopic neg     Bacteria, U Microscopic neg     Mucus, UA neg     Epithelial cells, urine per micros 1-4     Crystals, Ur, HPF, POC neg     Casts, Ur, LPF, POC neg     Yeast, UA neg    POCT URINALYSIS DIPSTICK      Result Value Ref Range   Color, UA yellow     Clarity, UA clear     Glucose, UA neg     Bilirubin, UA small     Ketones, UA trace     Spec Grav, UA 1.025     Blood, UA neg     pH, UA 5.5     Protein, UA trace     Urobilinogen, UA 0.2     Nitrite, UA positive     Leukocytes, UA Trace    POCT CBC      Result Value Ref Range   WBC 11.3 (*) 4.6 - 10.2 K/uL   Lymph, poc 2.6  0.6 - 3.4   POC LYMPH PERCENT 23.1  10 - 50 %L   MID (cbc) 0.6  0 - 0.9   POC MID % 4.9  0 - 12 %M   POC Granulocyte 8.1 (*) 2 - 6.9   Granulocyte percent 72.0  37 - 80 %G   RBC 4.80  4.04 - 5.48 M/uL   Hemoglobin 13.3  12.2 - 16.2 g/dL   HCT, POC 41.5  37.7 - 47.9 %   MCV 86.4  80 - 97 fL   MCH, POC 27.7  27 - 31.2 pg   MCHC 32.0  31.8 - 35.4 g/dL   RDW, POC 13.5     Platelet Count, POC 317  142 - 424 K/uL   MPV 7.4  0 - 99.8 fL   Assessment: Acute diverticulitis, partially treated, responding to therapy Vomiting and nausea Diarrhea Dehydration  Plan: Patient was given 2 L of IV fluids. Discussed with her that she is probably doing significantly better with the big drop in her white blood count. We'll continue outpatient treatment. Patient is  improved although still tender in the right lower quadrant. I think she should do well. She was cautioned to come in or go to the emergency room at any time if in all worse, that acute diverticulitis can become a very bad problem.

## 2014-03-20 NOTE — Patient Instructions (Addendum)
If acutely worse at anytime go to the emergency room or return here in the daytime.  Try to get plenty of liquids in.  Use the Zofran for nausea  Take the hydrocodone one every 4-6 hours if needed for severe pain. Can cause constipation.  Continue the Cipro and Flagyl  I recommend you get rechecked sometime in 3 or 4 days

## 2014-03-21 NOTE — Telephone Encounter (Signed)
Pt came in yesterday for a recheck.

## 2014-03-22 ENCOUNTER — Telehealth: Payer: Self-pay | Admitting: *Deleted

## 2014-03-22 NOTE — Telephone Encounter (Signed)
PA for Volteran Gel has been approved 03/19/2014-03/20/2015 Faxed approval to pharmacy.

## 2014-03-25 DIAGNOSIS — F331 Major depressive disorder, recurrent, moderate: Secondary | ICD-10-CM | POA: Diagnosis not present

## 2014-03-29 DIAGNOSIS — F331 Major depressive disorder, recurrent, moderate: Secondary | ICD-10-CM | POA: Diagnosis not present

## 2014-04-01 DIAGNOSIS — F331 Major depressive disorder, recurrent, moderate: Secondary | ICD-10-CM | POA: Diagnosis not present

## 2014-04-01 DIAGNOSIS — H40033 Anatomical narrow angle, bilateral: Secondary | ICD-10-CM | POA: Diagnosis not present

## 2014-04-01 DIAGNOSIS — H43393 Other vitreous opacities, bilateral: Secondary | ICD-10-CM | POA: Diagnosis not present

## 2014-04-01 DIAGNOSIS — H2513 Age-related nuclear cataract, bilateral: Secondary | ICD-10-CM | POA: Diagnosis not present

## 2014-04-06 DIAGNOSIS — F331 Major depressive disorder, recurrent, moderate: Secondary | ICD-10-CM | POA: Diagnosis not present

## 2014-04-08 DIAGNOSIS — F331 Major depressive disorder, recurrent, moderate: Secondary | ICD-10-CM | POA: Diagnosis not present

## 2014-04-13 DIAGNOSIS — F331 Major depressive disorder, recurrent, moderate: Secondary | ICD-10-CM | POA: Diagnosis not present

## 2014-04-23 ENCOUNTER — Other Ambulatory Visit: Payer: Self-pay | Admitting: Obstetrics & Gynecology

## 2014-04-23 DIAGNOSIS — N6002 Solitary cyst of left breast: Secondary | ICD-10-CM

## 2014-04-26 DIAGNOSIS — F331 Major depressive disorder, recurrent, moderate: Secondary | ICD-10-CM | POA: Diagnosis not present

## 2014-05-03 DIAGNOSIS — F331 Major depressive disorder, recurrent, moderate: Secondary | ICD-10-CM | POA: Diagnosis not present

## 2014-05-11 ENCOUNTER — Telehealth: Payer: Self-pay

## 2014-05-11 DIAGNOSIS — F331 Major depressive disorder, recurrent, moderate: Secondary | ICD-10-CM | POA: Diagnosis not present

## 2014-05-11 MED ORDER — DOXYCYCLINE HYCLATE 100 MG PO TABS: 100.0000 mg | ORAL_TABLET | Freq: Two times a day (BID) | ORAL | Status: DC

## 2014-05-11 NOTE — Telephone Encounter (Signed)
Called pt who reported that she does not have RFs left at [facility] as our computer shows, bc there was a mixup between Rxs for doxy hyclate which is correct and a Rx we had sent for doxy mono in error. They cancelled the correct RFs. I am sending in RFs according to what Dr L had Rxd.

## 2014-05-11 NOTE — Telephone Encounter (Signed)
Patient calling to get a refill on "Doxycycline hyclate tablets 100 mg 2x a day". Per patient she has been taking it for awhile now and usually gets a 90 day supply. Patient requesting this to be sent [facility] mail order pharmacy. Patients call back number is [phone]

## 2014-05-12 ENCOUNTER — Ambulatory Visit (INDEPENDENT_AMBULATORY_CARE_PROVIDER_SITE_OTHER): Payer: Medicare Other | Admitting: Family Medicine

## 2014-05-12 VITALS — BP 130/90 | HR 98 | Temp 98.2°F | Resp 16 | Ht 63.0 in | Wt 182.0 lb

## 2014-05-12 DIAGNOSIS — R030 Elevated blood-pressure reading, without diagnosis of hypertension: Secondary | ICD-10-CM | POA: Diagnosis not present

## 2014-05-12 DIAGNOSIS — IMO0001 Reserved for inherently not codable concepts without codable children: Secondary | ICD-10-CM

## 2014-05-12 DIAGNOSIS — R51 Headache: Secondary | ICD-10-CM | POA: Diagnosis not present

## 2014-05-12 DIAGNOSIS — Z23 Encounter for immunization: Secondary | ICD-10-CM

## 2014-05-12 DIAGNOSIS — I639 Cerebral infarction, unspecified: Secondary | ICD-10-CM | POA: Diagnosis not present

## 2014-05-12 DIAGNOSIS — R519 Headache, unspecified: Secondary | ICD-10-CM

## 2014-05-12 MED ORDER — ZONISAMIDE 25 MG PO CAPS: 25.0000 mg | ORAL_CAPSULE | Freq: Every day | ORAL | Status: DC

## 2014-05-12 NOTE — Patient Instructions (Addendum)
Influenza Vaccine (Flu Vaccine, Inactivated or Recombinant) 2014-2015: What You Need to Know 1. Why get vaccinated? Influenza ("flu") is a contagious disease that spreads around the [place] every winter, usually between October and May. Flu is caused by influenza viruses, and is spread mainly by coughing, sneezing, and close contact. Anyone can get flu, but the risk of getting flu is highest among children. Symptoms come on suddenly and may last several days. They can include:  fever/chills  sore throat  muscle aches  fatigue  cough  headache  runny or stuffy nose Flu can make some people much sicker than others. These people include young children, people 65 and older, pregnant women, and people with certain health conditions-such as heart, lung or kidney disease, nervous system disorders, or a weakened immune system. Flu vaccination is especially important for these people, and anyone in close contact with them. Flu can also lead to pneumonia, and make existing medical conditions worse. It can cause diarrhea and seizures in children. Each year thousands of people in the [place] die from flu, and many more are hospitalized. Flu vaccine is the best protection against flu and its complications. Flu vaccine also helps prevent spreading flu from person to person. 2. Inactivated and recombinant flu vaccines You are getting an injectable flu vaccine, which is either an "inactivated" or "recombinant" vaccine. These vaccines do not contain any live influenza virus. They are given by injection with a needle, and often called the "flu shot."  A different live, attenuated (weakened) influenza vaccine is sprayed into the nostrils. This vaccine is described in a separate Vaccine Information Statement. Flu vaccination is recommended every year. Some children 6 months through 8 years of age might need two doses during one year. Flu viruses are always changing. Each year's flu vaccine is made  to protect against 3 or 4 viruses that are likely to cause disease that year. Flu vaccine cannot prevent all cases of flu, but it is the best defense against the disease.  It takes about 2 weeks for protection to develop after the vaccination, and protection lasts several months to a year. Some illnesses that are not caused by influenza virus are often mistaken for flu. Flu vaccine will not prevent these illnesses. It can only prevent influenza. Some inactivated flu vaccine contains a very small amount of a mercury-based preservative called thimerosal. Studies have shown that thimerosal in vaccines is not harmful, but flu vaccines that do not contain a preservative are available. 3. Some people should not get this vaccine Tell the person who gives you the vaccine:  If you have any severe, life-threatening allergies. If you ever had a life-threatening allergic reaction after a dose of flu vaccine, or have a severe allergy to any part of this vaccine, including (for example) an allergy to gelatin, antibiotics, or eggs, you may be advised not to get vaccinated. Most, but not all, types of flu vaccine contain a small amount of egg protein.  If you ever had Guillain-Barr Syndrome (a severe paralyzing illness, also called GBS). Some people with a history of GBS should not get this vaccine. This should be discussed with your doctor.  If you are not feeling well. It is usually okay to get flu vaccine when you have a mild illness, but you might be advised to wait until you feel better. You should come back when you are better. 4. Risks of a vaccine reaction With a vaccine, like any medicine, there is a chance of side   effects. These are usually mild and go away on their own. Problems that could happen after any vaccine:  Brief fainting spells can happen after any medical procedure, including vaccination. Sitting or lying down for about 15 minutes can help prevent fainting, and injuries caused by a fall. Tell  your doctor if you feel dizzy, or have vision changes or ringing in the ears.  Severe shoulder pain and reduced range of motion in the arm where a shot was given can happen, very rarely, after a vaccination.  Severe allergic reactions from a vaccine are very rare, estimated at less than 1 in a million doses. If one were to occur, it would usually be within a few minutes to a few hours after the vaccination. Mild problems following inactivated flu vaccine:  soreness, redness, or swelling where the shot was given  hoarseness  sore, red or itchy eyes  cough  fever  aches  headache  itching  fatigue If these problems occur, they usually begin soon after the shot and last 1 or 2 days. Moderate problems following inactivated flu vaccine:  Young children who get inactivated flu vaccine and pneumococcal vaccine (PCV13) at the same time may be at increased risk for seizures caused by fever. Ask your doctor for more information. Tell your doctor if a child who is getting flu vaccine has ever had a seizure. Inactivated flu vaccine does not contain live flu virus, so you cannot get the flu from this vaccine. As with any medicine, there is a very remote chance of a vaccine causing a serious injury or death. The safety of vaccines is always being monitored. For more information, visit: www.cdc.gov/vaccinesafety/ 5. What if there is a serious reaction? What should I look for?  Look for anything that concerns you, such as signs of a severe allergic reaction, very high fever, or behavior changes. Signs of a severe allergic reaction can include hives, swelling of the face and throat, difficulty breathing, a fast heartbeat, dizziness, and weakness. These would start a few minutes to a few hours after the vaccination. What should I do?  If you think it is a severe allergic reaction or other emergency that can't wait, call 9-1-1 and get the person to the nearest hospital. Otherwise, call your  doctor.  Afterward, the reaction should be reported to the Vaccine Adverse Event Reporting System (VAERS). Your doctor should file this report, or you can do it yourself through the VAERS web site at www.vaers.hhs.gov, or by calling 1-800-822-7967. VAERS does not give medical advice. 6. The National Vaccine Injury Compensation Program The National Vaccine Injury Compensation Program (VICP) is a federal program that was created to compensate people who may have been injured by certain vaccines. Persons who believe they may have been injured by a vaccine can learn about the program and about filing a claim by calling 1-800-338-2382 or visiting the VICP website at www.hrsa.gov/vaccinecompensation. There is a time limit to file a claim for compensation. 7. How can I learn more?  Ask your health care provider.  Call your local or state health department.  Contact the Centers for Disease Control and Prevention (CDC):  Call 1-800-232-4636 (1-800-CDC-INFO) or  Visit CDC's website at www.cdc.gov/flu CDC Vaccine Information Statement (Interim) Inactivated Influenza Vaccine (02/17/2013) Document Released: 04/12/2006 Document Revised: 11/02/2013 Document Reviewed: 06/05/2013 [employer] Patient Information 2015 [employer]. This information is not intended to replace advice given to you by your health care provider. Make sure you discuss any questions you have with your health   care provider. Tension Headache A tension headache is a feeling of pain, pressure, or aching often felt over the front and sides of the head. The pain can be dull or can feel tight (constricting). It is the most common type of headache. Tension headaches are not normally associated with nausea or vomiting and do not get worse with physical activity. Tension headaches can last 30 minutes to several days.  CAUSES  The exact cause is not known, but it may be caused by chemicals and hormones in the brain that lead to pain. Tension  headaches often begin after stress, anxiety, or depression. Other triggers may include:  Alcohol.  Caffeine (too much or withdrawal).  Respiratory infections (colds, flu, sinus infections).  Dental problems or teeth clenching.  Fatigue.  Holding your head and neck in one position too long while using a computer. SYMPTOMS   Pressure around the head.   Dull, aching head pain.   Pain felt over the front and sides of the head.   Tenderness in the muscles of the head, neck, and shoulders. DIAGNOSIS  A tension headache is often diagnosed based on:   Symptoms.   Physical examination.   A CT scan or MRI of your head. These tests may be ordered if symptoms are severe or unusual. TREATMENT  Medicines may be given to help relieve symptoms.  HOME CARE INSTRUCTIONS   Only take over-the-counter or prescription medicines for pain or discomfort as directed by your caregiver.   Lie down in a dark, quiet room when you have a headache.   Keep a journal to find out what may be triggering your headaches. For example, write down:  What you eat and drink.  How much sleep you get.  Any change to your diet or medicines.  Try massage or other relaxation techniques.   Ice packs or heat applied to the head and neck can be used. Use these 3 to 4 times per day for 15 to 20 minutes each time, or as needed.   Limit stress.   Sit up straight, and do not tense your muscles.   Quit smoking if you smoke.  Limit alcohol use.  Decrease the amount of caffeine you drink, or stop drinking caffeine.  Eat and exercise regularly.  Get 7 to 9 hours of sleep, or as recommended by your caregiver.  Avoid excessive use of pain medicine as recurrent headaches can occur.  SEEK MEDICAL CARE IF:   You have problems with the medicines you were prescribed.  Your medicines do not work.  You have a change from the usual headache.  You have nausea or vomiting. SEEK IMMEDIATE MEDICAL CARE  IF:   Your headache becomes severe.  You have a fever.  You have a stiff neck.  You have loss of vision.  You have muscular weakness or loss of muscle control.  You lose your balance or have trouble walking.  You feel faint or pass out.  You have severe symptoms that are different from your first symptoms. MAKE SURE YOU:   Understand these instructions.  Will watch your condition.  Will get help right away if you are not doing well or get worse. Document Released: 06/18/2005 Document Revised: 09/10/2011 Document Reviewed: 06/08/2011 [facility] Patient Information 2015 [place], [place]. This information is not intended to replace advice given to you by your health care provider. Make sure you discuss any questions you have with your health care provider.

## 2014-05-12 NOTE — Progress Notes (Signed)
Subjective:   This chart was scribed for [name], MD by Forrestine Him, [facility] Scribe. This patient was seen in room 14 and the patient's care was started 5:32 PM.    Patient ID: Tamara [name], female    DOB: 1961-06-15, 53 y.o.   MRN: [id number]  Chief Complaint  Patient presents with  . Headache    x 1 month  . Hypertension  . Flu Vaccine    HPI  HPI Comments: Tamara [name] is a 53 y.o. female with a PMHx of hyperlipidemia, HTN, and GERD who presents to [facility] complaining of ongoing, constant, moderate HAs x 1 month. She also reports intermittent chills. Pt attributes symptoms to recent high blood pressure readings. Ms. Derner reports BP readings of 140/90 at home. She has tried prescribed Hydrocodone with Tylenol #3 for HAs with mild temporary improvement. She has also tried ice application to areas of discomfort with mild relief. Pt denies any fever, sinus pressure, or congestion. Pt with known allergies to oxycodone, propoxyphene HCL, seroquel, sulfonamide derivatives, and penicillin. No other concerns this visit.   Patient Active Problem List   Diagnosis Date Noted  . Migraine 11/05/2013  . Stroke 10/22/2013  . Disturbance of skin sensation 10/21/2013  . Abnormal EKG 08/31/2011  . ASTHMA 08/01/2010  . HEMORRHOIDS-EXTERNAL 05/30/2010  . ALLERGIC RHINITIS 04/14/2008  . HYPERLIPIDEMIA 09/17/2007  . OBESITY 09/17/2007  . ANXIETY 09/17/2007  . DEPRESSION 09/17/2007  . HYPERTENSION 09/17/2007  . GERD 09/17/2007  . ACNE, MILD 09/17/2007   Past Medical History  Diagnosis Date  . Depression   . HTN (hypertension)   . Hyperlipemia   . Allergy   . GERD (gastroesophageal reflux disease)   . Neuromuscular disorder   . Anemia   . Anxiety   . Asthma   . Shock therapy as cause of abnormal reaction of patient or of later complication without mention of misadventure at time of procedure   . History of short term memory  loss   . Diverticulitis    Past Surgical History  Procedure Laterality Date  . Cholecystectomy    . Abdominal hysterectomy      partial  . Colonoscopy     Allergies  Allergen Reactions  . Adhesive [Tape]     This is tape as well as adhesive on bandaids.  . Gabapentin     Unknown reaction  . Ivp Dye [Iodinated Diagnostic Agents] Swelling  . Oxycodone   . Propoxyphene Hcl Other (See Comments)    hallucination  . Seroquel [Quetiapine Fumerate]     Unknown reaction  . Statins     Arm pain  . Penicillins Rash  . Sulfonamide Derivatives Rash   Prior to Admission medications   Medication Sig Start Date End Date Taking? Authorizing Provider  albuterol (PROVENTIL HFA;VENTOLIN HFA) 108 (90 BASE) MCG/ACT inhaler Inhale 2 puffs into the lungs every 6 (six) hours as needed for wheezing or shortness of breath. 03/02/14  Yes Mancel Bale, PA-C  ALPRAZolam Duanne Moron) 1 MG tablet Take 1 tablet (1 mg total) by mouth daily as needed for anxiety. 03/16/14  Yes [name], MD  ALPRAZOLAM XR 1 MG 24 hr tablet Take 3 mg by mouth at bedtime.  08/10/11  Yes Historical Provider, MD  aspirin 325 MG tablet Take 1 tablet (325 mg total) by mouth daily. 10/22/13  Yes Bernadene Bell, MD  cyclobenzaprine (FLEXERIL) 10 MG tablet Take 10 mg by mouth 3 (  three) times daily as needed for muscle spasms.   Yes Historical Provider, MD  dexlansoprazole (DEXILANT) 60 MG capsule Take 1 capsule (60 mg total) by mouth daily. 06/04/13  Yes [name], MD  diclofenac sodium (VOLTAREN) 1 % GEL Apply 2 g topically daily as needed (for pain). 03/18/14  Yes [name], MD  doxycycline (VIBRA-TABS) 100 MG tablet Take 1 tablet (100 mg total) by mouth 2 (two) times daily. 05/11/14  Yes [name], MD  fenofibrate (TRICOR) 145 MG tablet Take 1 tablet (145 mg total) by mouth daily. 01/20/14  Yes [name], MD  HYDROcodone-acetaminophen (NORCO) 5-325 MG per tablet Take 1 tablet by mouth every 4 (four) hours as needed.  03/20/14  Yes Posey Boyer, MD  HYDROcodone-acetaminophen (NORCO/VICODIN) 5-325 MG per tablet Take 1 tablet by mouth every 6 (six) hours as needed for moderate pain.   Yes Historical Provider, MD  LYSINE PO Take 1 tablet by mouth as needed. prn   Yes Historical Provider, MD  temazepam (RESTORIL) 15 MG capsule Take 45 mg by mouth at bedtime.   Yes Historical Provider, MD  Vilazodone HCl 20 MG TABS Take 20 mg by mouth daily.    Yes Historical Provider, MD  doxycycline (VIBRA-TABS) 100 MG tablet Take 100 mg by mouth 2 (two) times daily.     Historical Provider, MD    Review of Systems  Constitutional: Positive for chills. Negative for fever.  HENT: Negative for congestion and sinus pressure.   Respiratory: Negative for cough.   Neurological: Positive for headaches.    Triage Vitals: BP 130/90 mmHg  Pulse 98  Temp(Src) 98.2 F (36.8 C) (Oral)  Resp 16  Ht 5\' 3"  (1.6 m)  Wt 182 lb (82.555 kg)  BMI 32.25 kg/m2  SpO2 97%   Objective:  Physical Exam  Constitutional: She is oriented to person, place, and time. She appears well-developed and well-nourished. No distress.  HENT:  Head: Normocephalic and atraumatic.  Right Ear: Hearing, tympanic membrane, external ear and ear canal normal.  Left Ear: Hearing, tympanic membrane, external ear and ear canal normal.  Nose: No mucosal edema or sinus tenderness. Right sinus exhibits no maxillary sinus tenderness and no frontal sinus tenderness. Left sinus exhibits no maxillary sinus tenderness and no frontal sinus tenderness.  Mouth/Throat: Uvula is midline, oropharynx is clear and moist and mucous membranes are normal.  Eyes: EOM are normal.  Neck: Normal range of motion. Neck supple.  Cardiovascular: Normal rate, regular rhythm and normal heart sounds.   Blood pressure during physical examination: 120/88  Pulmonary/Chest: Effort normal and breath sounds normal.  Abdominal: Soft. She exhibits no distension. There is no tenderness.    Musculoskeletal: Normal range of motion.  Neurological: She is alert and oriented to person, place, and time.  Skin: Skin is warm and dry. She is not diaphoretic.  Psychiatric: She has a normal mood and affect. Judgment normal.  Nursing note and vitals reviewed.   Assessment & Plan:   I personally performed the services described in this documentation, which was scribed in my presence. The recorded information has been reviewed and is accurate.  Elevated BP - Plan: zonisamide (ZONEGRAN) 25 MG capsule  Headache, unspecified headache type - Plan: zonisamide (ZONEGRAN) 25 MG capsule  Needs flu shot - Plan: Flu Vaccine QUAD 36+ mos IM  Signed, [name], MD

## 2014-05-17 DIAGNOSIS — F331 Major depressive disorder, recurrent, moderate: Secondary | ICD-10-CM | POA: Diagnosis not present

## 2014-05-18 DIAGNOSIS — R300 Dysuria: Secondary | ICD-10-CM | POA: Diagnosis not present

## 2014-05-18 DIAGNOSIS — Z779 Other contact with and (suspected) exposures hazardous to health: Secondary | ICD-10-CM | POA: Diagnosis not present

## 2014-05-19 DIAGNOSIS — R51 Headache: Secondary | ICD-10-CM | POA: Diagnosis not present

## 2014-05-19 DIAGNOSIS — M9901 Segmental and somatic dysfunction of cervical region: Secondary | ICD-10-CM | POA: Diagnosis not present

## 2014-05-24 DIAGNOSIS — F331 Major depressive disorder, recurrent, moderate: Secondary | ICD-10-CM | POA: Diagnosis not present

## 2014-05-31 DIAGNOSIS — M9901 Segmental and somatic dysfunction of cervical region: Secondary | ICD-10-CM | POA: Diagnosis not present

## 2014-05-31 DIAGNOSIS — R51 Headache: Secondary | ICD-10-CM | POA: Diagnosis not present

## 2014-06-01 DIAGNOSIS — F331 Major depressive disorder, recurrent, moderate: Secondary | ICD-10-CM | POA: Diagnosis not present

## 2014-06-02 ENCOUNTER — Other Ambulatory Visit: Payer: Medicare Other

## 2014-06-02 ENCOUNTER — Telehealth: Payer: Self-pay

## 2014-06-02 NOTE — Telephone Encounter (Signed)
Call: I do not think she needs chronic pain meds.  Advise recheck if she feels like she is having sufficient pain to need this on a continued basis.  We will need to reassess her.

## 2014-06-02 NOTE — Telephone Encounter (Signed)
Pt states she is in need of her HYDROCODONE 5-325 MGS. Please call [phone]

## 2014-06-03 NOTE — Telephone Encounter (Signed)
Pt will RTC

## 2014-06-10 ENCOUNTER — Telehealth: Payer: Self-pay | Admitting: Family Medicine

## 2014-06-10 DIAGNOSIS — K219 Gastro-esophageal reflux disease without esophagitis: Secondary | ICD-10-CM

## 2014-06-10 NOTE — Telephone Encounter (Signed)
I will be out of town until Tuesday.  If someone could write a 3 ;month RX I would appreciate it.

## 2014-06-10 NOTE — Telephone Encounter (Signed)
Dr. Joseph Art:  Patient needs a hand written prescription for Dexiliant 60 mg (for prescription assistance)  Please call her to discuss [phone]. Patient had to change her January CPE appt. Due to insurance would not cover until March.

## 2014-06-11 DIAGNOSIS — R51 Headache: Secondary | ICD-10-CM | POA: Diagnosis not present

## 2014-06-11 DIAGNOSIS — M9901 Segmental and somatic dysfunction of cervical region: Secondary | ICD-10-CM | POA: Diagnosis not present

## 2014-06-11 MED ORDER — DEXLANSOPRAZOLE 60 MG PO CPDR: 60.0000 mg | DELAYED_RELEASE_CAPSULE | Freq: Every day | ORAL | Status: DC

## 2014-06-11 NOTE — Telephone Encounter (Signed)
Meds ordered this encounter  Medications  . dexlansoprazole (DEXILANT) 60 MG capsule    Sig: Take 1 capsule (60 mg total) by mouth daily.    Dispense:  90 capsule    Refill:  3    Order Specific Question:  Supervising Provider    Answer:  DOOLITTLE, ROBERT P [[id number]]

## 2014-06-11 NOTE — Telephone Encounter (Signed)
Pt notified. In pick up drawer.

## 2014-06-17 DIAGNOSIS — F331 Major depressive disorder, recurrent, moderate: Secondary | ICD-10-CM | POA: Diagnosis not present

## 2014-06-18 ENCOUNTER — Ambulatory Visit (INDEPENDENT_AMBULATORY_CARE_PROVIDER_SITE_OTHER): Payer: Medicare Other | Admitting: Family Medicine

## 2014-06-18 VITALS — BP 132/92 | HR 81 | Temp 98.2°F | Resp 16 | Ht 63.0 in | Wt 184.2 lb

## 2014-06-18 DIAGNOSIS — F329 Major depressive disorder, single episode, unspecified: Secondary | ICD-10-CM | POA: Diagnosis not present

## 2014-06-18 DIAGNOSIS — J4520 Mild intermittent asthma, uncomplicated: Secondary | ICD-10-CM | POA: Diagnosis not present

## 2014-06-18 DIAGNOSIS — G44209 Tension-type headache, unspecified, not intractable: Secondary | ICD-10-CM

## 2014-06-18 DIAGNOSIS — K219 Gastro-esophageal reflux disease without esophagitis: Secondary | ICD-10-CM

## 2014-06-18 DIAGNOSIS — I639 Cerebral infarction, unspecified: Secondary | ICD-10-CM | POA: Diagnosis not present

## 2014-06-18 DIAGNOSIS — E785 Hyperlipidemia, unspecified: Secondary | ICD-10-CM

## 2014-06-18 MED ORDER — FENOFIBRATE 145 MG PO TABS: 145.0000 mg | ORAL_TABLET | Freq: Every day | ORAL | Status: DC

## 2014-06-18 MED ORDER — ALBUTEROL SULFATE HFA 108 (90 BASE) MCG/ACT IN AERS: 2.0000 | INHALATION_SPRAY | Freq: Four times a day (QID) | RESPIRATORY_TRACT | Status: DC | PRN

## 2014-06-18 MED ORDER — DICLOFENAC SODIUM 1 % TD GEL: 2.0000 g | Freq: Every day | TRANSDERMAL | Status: DC | PRN

## 2014-06-18 MED ORDER — CYCLOBENZAPRINE HCL 10 MG PO TABS: 10.0000 mg | ORAL_TABLET | Freq: Three times a day (TID) | ORAL | Status: DC | PRN

## 2014-06-18 MED ORDER — DEXLANSOPRAZOLE 60 MG PO CPDR: 60.0000 mg | DELAYED_RELEASE_CAPSULE | Freq: Every day | ORAL | Status: DC

## 2014-06-18 MED ORDER — HYDROCODONE-ACETAMINOPHEN 5-325 MG PO TABS: 1.0000 | ORAL_TABLET | Freq: Four times a day (QID) | ORAL | Status: DC | PRN

## 2014-06-18 NOTE — Progress Notes (Signed)
° °  Subjective:    Patient ID: Tamara [name], female    DOB: Jun 25, 1961, 53 y.o.   MRN: [id number] This chart was scribed for [name], MD by Zola Button, Medical Scribe. This patient was seen in Room 9 and the patient's care was started at 5:18 PM.   HPI HPI Comments: Tamara [name] is a 53 y.o. female who presents to the [facility] complaining of gradually improving HA. She has not been taking the HA medication that I last prescribed because she has been experimenting with other methods. Patient has been going to her chiropractor, who did an occipital adjustment and told her she has some swelling to the back of her head. She has seen some improvement to her HA since going to the chiropractor; her HAs have been occurring less frequently. She saw Dr. Linna Darner months ago for a diverticulitis flare-up, and she was given hydrocodone-acetaminophen at that time and was told to avoid ibuprofen. The hydrocodone-acetaminophen has been helping with her HA, but she has not been taking it often. Patient was only given 20 doses after her visit with Dr. Linna Darner and finished up her last dose yesterday; she request a refill for this medication. Patient has also been taking magnesium once a day for the past month without any problems, which has also helped with her HA. She has also changed her pillow, which she states has also helped with her HA. Patient was taken off trileptal and put on Tegretol by Dr. Casimiro Needle due to problems with noise sensitivity. Overall, she states that her HA has been improving.  Patient takes 3 xanax at night and also another prn. She has also been taking 1 ASA per day at night. She has interest in starting the migraine Excedrin medication.  Patient is on disabilities.  Review of Systems  Neurological: Positive for headaches.       Objective:   Physical Exam CONSTITUTIONAL: Well developed/well nourished HEAD: Normocephalic/atraumatic EYES: EOM/PERRL ENMT:  Mucous membranes moist NECK: supple no meningeal signs SPINE: entire spine nontender CV: S1/S2 noted, no murmurs/rubs/gallops noted LUNGS: Lungs are clear to auscultation bilaterally, no apparent distress ABDOMEN: soft, nontender, no rebound or guarding GU: no cva tenderness NEURO: Pt is awake/alert, moves all extremitiesx4 EXTREMITIES: pulses normal, full ROM SKIN: warm, color normal PSYCH: no abnormalities of mood noted        Assessment & Plan:   This chart was scribed in my presence and reviewed by me personally.    ICD-9-CM ICD-10-CM   1. Tension-type headache, not intractable, unspecified chronicity pattern 339.10 G44.209 HYDROcodone-acetaminophen (NORCO/VICODIN) 5-325 MG per tablet     diclofenac sodium (VOLTAREN) 1 % GEL     cyclobenzaprine (FLEXERIL) 10 MG tablet     DISCONTINUED: diclofenac sodium (VOLTAREN) 1 % GEL     DISCONTINUED: cyclobenzaprine (FLEXERIL) 10 MG tablet  2. Asthma, chronic, mild intermittent, uncomplicated 536.46 O03.21 albuterol (PROVENTIL HFA;VENTOLIN HFA) 108 (90 BASE) MCG/ACT inhaler  3. Gastroesophageal reflux disease, esophagitis presence not specified 530.81 K21.9 dexlansoprazole (DEXILANT) 60 MG capsule  4. Hyperlipidemia 272.4 E78.5 fenofibrate (TRICOR) 145 MG tablet     DISCONTINUED: fenofibrate (TRICOR) 145 MG tablet     Signed, [name], MD

## 2014-06-22 DIAGNOSIS — F331 Major depressive disorder, recurrent, moderate: Secondary | ICD-10-CM | POA: Diagnosis not present

## 2014-06-28 ENCOUNTER — Telehealth: Payer: Self-pay

## 2014-06-28 DIAGNOSIS — E785 Hyperlipidemia, unspecified: Secondary | ICD-10-CM

## 2014-06-28 NOTE — Telephone Encounter (Signed)
Dr Joseph Art wanted patient to see dr Dairl Ponder in [place] [facility] and they need a referral from [place] before patient can be scheduled

## 2014-06-29 ENCOUNTER — Other Ambulatory Visit: Payer: Self-pay | Admitting: Family Medicine

## 2014-06-29 DIAGNOSIS — F331 Major depressive disorder, recurrent, moderate: Secondary | ICD-10-CM | POA: Diagnosis not present

## 2014-06-29 NOTE — Telephone Encounter (Signed)
Referral pended   Please advise

## 2014-07-01 DIAGNOSIS — M9901 Segmental and somatic dysfunction of cervical region: Secondary | ICD-10-CM | POA: Diagnosis not present

## 2014-07-01 DIAGNOSIS — R51 Headache: Secondary | ICD-10-CM | POA: Diagnosis not present

## 2014-07-05 ENCOUNTER — Ambulatory Visit (INDEPENDENT_AMBULATORY_CARE_PROVIDER_SITE_OTHER): Payer: Medicare Other | Admitting: Family Medicine

## 2014-07-05 VITALS — BP 128/84 | HR 100 | Temp 98.0°F | Resp 16 | Ht 62.25 in | Wt 182.0 lb

## 2014-07-05 DIAGNOSIS — R51 Headache: Secondary | ICD-10-CM | POA: Diagnosis not present

## 2014-07-05 DIAGNOSIS — R519 Headache, unspecified: Secondary | ICD-10-CM

## 2014-07-05 NOTE — Patient Instructions (Signed)
We will call you in the morning about your MRI. Please keep your phone by you.

## 2014-07-05 NOTE — Progress Notes (Signed)
Subjective:  This chart was scribed for [name], MD by Mercy Moore, Medial Scribe. This patient was seen in room 9 and the patient's care was started at 6:21 PM.    Patient ID: Tamara [name], female    DOB: 1960/12/06, 54 y.o.   MRN: [id number]  Chief Complaint  Patient presents with  . Headache    x 4 days    HPI HPI Comments: Tamara [name] is a 54 y.o. female with PMHx of migraine, stroke, asthma, Hyperlipidemia, depression, Hypertension, GERD who presents to the [facility] complaining of constant headache for four days now; patient locates pain at her right occipital and parietal regions. Patient describes this as the worst headache of her life and states that the headache has been wearing her out. Patient desires an MRI. Patient last seen 06/18/2014 for her headaches; patient discharged with Vicodin, Flexeril, and Voltaren. Patient states that she has an upcoming appointment in [place] with her neurologist. Patient reports treatment with prescribed medications, Biofreeze, and chiropractic therapy, but she had been unable to find relief. Patient reports mild photosensitivity, but denies visual changes, vertigo or audio changes. Patient reports recent change in medication for her hearing which initiated tinnitus.    Patient Active Problem List   Diagnosis Date Noted  . Migraine 11/05/2013  . Stroke 10/22/2013  . Disturbance of skin sensation 10/21/2013  . Abnormal EKG 08/31/2011  . ASTHMA 08/01/2010  . HEMORRHOIDS-EXTERNAL 05/30/2010  . ALLERGIC RHINITIS 04/14/2008  . HYPERLIPIDEMIA 09/17/2007  . OBESITY 09/17/2007  . ANXIETY 09/17/2007  . DEPRESSION 09/17/2007  . HYPERTENSION 09/17/2007  . GERD 09/17/2007  . ACNE, MILD 09/17/2007   Past Medical History  Diagnosis Date  . Depression   . HTN (hypertension)   . Hyperlipemia   . Allergy   . GERD (gastroesophageal reflux disease)   . Neuromuscular disorder   . Anemia   . Anxiety     . Asthma   . Shock therapy as cause of abnormal reaction of patient or of later complication without mention of misadventure at time of procedure   . History of short term memory loss   . Diverticulitis    Past Surgical History  Procedure Laterality Date  . Cholecystectomy    . Abdominal hysterectomy      partial  . Colonoscopy     Allergies  Allergen Reactions  . Adhesive [Tape]     This is tape as well as adhesive on bandaids.  . Gabapentin     Unknown reaction  . Ivp Dye [Iodinated Diagnostic Agents] Swelling  . Oxycodone   . Propoxyphene Hcl Other (See Comments)    hallucination  . Seroquel [Quetiapine Fumerate]     Unknown reaction  . Statins     Arm pain  . Penicillins Rash  . Sulfonamide Derivatives Rash   Prior to Admission medications   Medication Sig Start Date End Date Taking? Authorizing Provider  Oxcarbazepine (TRILEPTAL) 300 MG tablet Take 300 mg by mouth 2 (two) times daily.   Yes Historical Provider, MD  albuterol (PROVENTIL HFA;VENTOLIN HFA) 108 (90 BASE) MCG/ACT inhaler Inhale 2 puffs into the lungs every 6 (six) hours as needed for wheezing or shortness of breath. 06/18/14   [name], MD  ALPRAZolam Duanne Moron) 1 MG tablet Take 1 tablet (1 mg total) by mouth daily as needed for anxiety. 03/16/14   [name], MD  ALPRAZOLAM XR 1 MG 24 hr tablet Take 3 mg by mouth at bedtime.  08/10/11   Historical Provider, MD  aspirin 325 MG tablet Take 1 tablet (325 mg total) by mouth daily. 10/22/13   Bernadene Bell, MD  cyclobenzaprine (FLEXERIL) 10 MG tablet Take 1 tablet (10 mg total) by mouth 3 (three) times daily as needed for muscle spasms. 06/18/14   [name], MD  dexlansoprazole (DEXILANT) 60 MG capsule Take 1 capsule (60 mg total) by mouth daily. 06/18/14   [name], MD  diclofenac sodium (VOLTAREN) 1 % GEL Apply 2 g topically daily as needed (for pain). 06/18/14   [name], MD  fenofibrate (TRICOR) 145 MG tablet Take 1 tablet (145 mg  total) by mouth daily. 06/18/14   [name], MD  HYDROcodone-acetaminophen (NORCO/VICODIN) 5-325 MG per tablet Take 1 tablet by mouth every 6 (six) hours as needed for moderate pain. 06/18/14   [name], MD  LYSINE PO Take 1 tablet by mouth as needed. prn    Historical Provider, MD  temazepam (RESTORIL) 15 MG capsule Take 45 mg by mouth at bedtime.    Historical Provider, MD  Vilazodone HCl 20 MG TABS Take 20 mg by mouth daily.     Historical Provider, MD   History   Social History  . Marital Status: Divorced    Spouse Name: N/A    Number of Children: 3  . Years of Education: college   Occupational History  .      Disabled   Social History Main Topics  . Smoking status: Never Smoker   . Smokeless tobacco: Never Used  . Alcohol Use: No  . Drug Use: No  . Sexual Activity: Yes    Birth Control/ Protection: None   Other Topics Concern  . Not on file   Social History Narrative   Lives with boyfriend. Walks daily for 20 minutes. Education: [employer].     Review of Systems  Constitutional: Negative for fever and chills.  HENT: Positive for tinnitus.   Eyes: Positive for photophobia. Negative for visual disturbance.  Neurological: Positive for headaches.       Objective:   Physical Exam no acute distress HEENT:  Normal fundi, normal TMs, neck supple, oropharynx clear No adenopathy no thyromegaly no rash Chest clear Heart regular, no murmur Extremities: Moving 4 extremities equally Cranial nerves III through XII intact  Filed Vitals:   07/05/14 1742  BP: 128/84  Pulse: 100  Temp: 98 F (36.7 C)  TempSrc: Oral  Resp: 16  Height: 5' 2.25" (1.581 m)  Weight: 182 lb (82.555 kg)  SpO2: 100%        Assessment & Plan:    I personally performed the services described in this documentation, which was scribed in my presence. The recorded information has been reviewed and is accurate.  This chart was scribed in my presence and reviewed by me  personally.    ICD-9-CM ICD-10-CM   1. Acute nonintractable headache, unspecified headache type 784.0 R51 MR Brain Wo Contrast     CANCELED: MR Brain Wo Contrast     Signed, [name], MD

## 2014-07-06 ENCOUNTER — Ambulatory Visit (HOSPITAL_COMMUNITY)
Admission: RE | Admit: 2014-07-06 | Discharge: 2014-07-06 | Disposition: A | Discharge status: Home / Self Care | Payer: Medicare Other | Source: Ambulatory Visit | Attending: Family Medicine | Admitting: Family Medicine

## 2014-07-06 ENCOUNTER — Ambulatory Visit
Admission: RE | Admit: 2014-07-06 | Discharge: 2014-07-06 | Disposition: A | Discharge status: Home / Self Care | Payer: Medicare Other | Source: Ambulatory Visit | Attending: Obstetrics & Gynecology | Admitting: Obstetrics & Gynecology

## 2014-07-06 ENCOUNTER — Other Ambulatory Visit: Payer: Self-pay | Admitting: Obstetrics & Gynecology

## 2014-07-06 DIAGNOSIS — N6002 Solitary cyst of left breast: Secondary | ICD-10-CM

## 2014-07-06 DIAGNOSIS — R51 Headache: Secondary | ICD-10-CM | POA: Insufficient documentation

## 2014-07-06 DIAGNOSIS — N6012 Diffuse cystic mastopathy of left breast: Secondary | ICD-10-CM | POA: Diagnosis not present

## 2014-07-06 DIAGNOSIS — F331 Major depressive disorder, recurrent, moderate: Secondary | ICD-10-CM | POA: Diagnosis not present

## 2014-07-06 DIAGNOSIS — R519 Headache, unspecified: Secondary | ICD-10-CM

## 2014-07-07 DIAGNOSIS — E785 Hyperlipidemia, unspecified: Secondary | ICD-10-CM | POA: Diagnosis not present

## 2014-07-07 DIAGNOSIS — E042 Nontoxic multinodular goiter: Secondary | ICD-10-CM | POA: Diagnosis not present

## 2014-07-07 DIAGNOSIS — E039 Hypothyroidism, unspecified: Secondary | ICD-10-CM | POA: Diagnosis not present

## 2014-07-08 ENCOUNTER — Other Ambulatory Visit: Payer: Medicare Other

## 2014-07-08 ENCOUNTER — Encounter: Payer: Medicare Other | Admitting: Family Medicine

## 2014-07-08 DIAGNOSIS — M9901 Segmental and somatic dysfunction of cervical region: Secondary | ICD-10-CM | POA: Diagnosis not present

## 2014-07-08 DIAGNOSIS — R51 Headache: Secondary | ICD-10-CM | POA: Diagnosis not present

## 2014-07-09 DIAGNOSIS — M9901 Segmental and somatic dysfunction of cervical region: Secondary | ICD-10-CM | POA: Diagnosis not present

## 2014-07-09 DIAGNOSIS — R51 Headache: Secondary | ICD-10-CM | POA: Diagnosis not present

## 2014-07-14 ENCOUNTER — Institutional Professional Consult (permissible substitution): Payer: Medicare Other | Admitting: Cardiovascular Disease

## 2014-07-16 DIAGNOSIS — F329 Major depressive disorder, single episode, unspecified: Secondary | ICD-10-CM | POA: Diagnosis not present

## 2014-07-28 DIAGNOSIS — F331 Major depressive disorder, recurrent, moderate: Secondary | ICD-10-CM | POA: Diagnosis not present

## 2014-08-07 ENCOUNTER — Encounter: Payer: Self-pay | Admitting: *Deleted

## 2014-08-07 ENCOUNTER — Emergency Department (INDEPENDENT_AMBULATORY_CARE_PROVIDER_SITE_OTHER)
Admission: EM | Admit: 2014-08-07 | Discharge: 2014-08-07 | Disposition: A | Discharge status: Home / Self Care | Payer: Medicare Other | Source: Home / Self Care | Attending: Family Medicine | Admitting: Family Medicine

## 2014-08-07 DIAGNOSIS — T23202A Burn of second degree of left hand, unspecified site, initial encounter: Secondary | ICD-10-CM | POA: Diagnosis not present

## 2014-08-07 DIAGNOSIS — M545 Low back pain, unspecified: Secondary | ICD-10-CM

## 2014-08-07 DIAGNOSIS — M9901 Segmental and somatic dysfunction of cervical region: Secondary | ICD-10-CM | POA: Diagnosis not present

## 2014-08-07 DIAGNOSIS — R51 Headache: Secondary | ICD-10-CM | POA: Diagnosis not present

## 2014-08-07 MED ORDER — CYCLOBENZAPRINE HCL 10 MG PO TABS: 10.0000 mg | ORAL_TABLET | Freq: Every evening | ORAL | Status: DC | PRN

## 2014-08-07 MED ORDER — SILVER SULFADIAZINE 1 % EX CREA: TOPICAL_CREAM | Freq: Once | CUTANEOUS | Status: DC

## 2014-08-07 MED ORDER — SILVER SULFADIAZINE 1 % EX CREA: TOPICAL_CREAM | Freq: Two times a day (BID) | CUTANEOUS | Status: DC

## 2014-08-07 NOTE — ED Notes (Signed)
Pt is here for 10/10 pain in lower back.  She saw a chiropractor this morning and it is worse.  She has had some pain in the past but not liek this.  She also has burn on 3 fingers on L hand.  She  Poured hot water on her hand 1 week ago.  The burns are pink and mostly healed.

## 2014-08-07 NOTE — Discharge Instructions (Signed)
Thank you for coming in today. Apply Silvadene cream twice daily until the skin looks better. Stop if you develop a rash. Use Flexeril at bedtime as needed for muscle spasms. You can also take ibuprofen or Aleve for pain. Use a heating pad.  Return as needed.    Back Exercises These exercises may help you when beginning to rehabilitate your injury. Your symptoms may resolve with or without further involvement from your physician, physical therapist or athletic trainer. While completing these exercises, remember:   Restoring tissue flexibility helps normal motion to return to the joints. This allows healthier, less painful movement and activity.  An effective stretch should be held for at least 30 seconds.  A stretch should never be painful. You should only feel a gentle lengthening or release in the stretched tissue. STRETCH - Extension, Prone on Elbows   Lie on your stomach on the floor, a bed will be too soft. Place your palms about shoulder width apart and at the height of your head.  Place your elbows under your shoulders. If this is too painful, stack pillows under your chest.  Allow your body to relax so that your hips drop lower and make contact more completely with the floor.  Hold this position for __________ seconds.  Slowly return to lying flat on the floor. Repeat __________ times. Complete this exercise __________ times per day.  RANGE OF MOTION - Extension, Prone Press Ups   Lie on your stomach on the floor, a bed will be too soft. Place your palms about shoulder width apart and at the height of your head.  Keeping your back as relaxed as possible, slowly straighten your elbows while keeping your hips on the floor. You may adjust the placement of your hands to maximize your comfort. As you gain motion, your hands will come more underneath your shoulders.  Hold this position __________ seconds.  Slowly return to lying flat on the floor. Repeat __________ times.  Complete this exercise __________ times per day.  RANGE OF MOTION- Quadruped, Neutral Spine   Assume a hands and knees position on a firm surface. Keep your hands under your shoulders and your knees under your hips. You may place padding under your knees for comfort.  Drop your head and point your tail bone toward the ground below you. This will round out your low back like an angry cat. Hold this position for __________ seconds.  Slowly lift your head and release your tail bone so that your back sags into a large arch, like an old horse.  Hold this position for __________ seconds.  Repeat this until you feel limber in your low back.  Now, find your "sweet spot." This will be the most comfortable position somewhere between the two previous positions. This is your neutral spine. Once you have found this position, tense your stomach muscles to support your low back.  Hold this position for __________ seconds. Repeat __________ times. Complete this exercise __________ times per day.  STRETCH - Flexion, Single Knee to Chest   Lie on a firm bed or floor with both legs extended in front of you.  Keeping one leg in contact with the floor, bring your opposite knee to your chest. Hold your leg in place by either grabbing behind your thigh or at your knee.  Pull until you feel a gentle stretch in your low back. Hold __________ seconds.  Slowly release your grasp and repeat the exercise with the opposite side. Repeat __________ times. Complete  this exercise __________ times per day.  STRETCH - Hamstrings, Standing  Stand or sit and extend your right / left leg, placing your foot on a chair or foot stool  Keeping a slight arch in your low back and your hips straight forward.  Lead with your chest and lean forward at the waist until you feel a gentle stretch in the back of your right / left knee or thigh. (When done correctly, this exercise requires leaning only a small distance.)  Hold this  position for __________ seconds. Repeat __________ times. Complete this stretch __________ times per day. STRENGTHENING - Deep Abdominals, Pelvic Tilt   Lie on a firm bed or floor. Keeping your legs in front of you, bend your knees so they are both pointed toward the ceiling and your feet are flat on the floor.  Tense your lower abdominal muscles to press your low back into the floor. This motion will rotate your pelvis so that your tail bone is scooping upwards rather than pointing at your feet or into the floor.  With a gentle tension and even breathing, hold this position for __________ seconds. Repeat __________ times. Complete this exercise __________ times per day.  STRENGTHENING - Abdominals, Crunches   Lie on a firm bed or floor. Keeping your legs in front of you, bend your knees so they are both pointed toward the ceiling and your feet are flat on the floor. Cross your arms over your chest.  Slightly tip your chin down without bending your neck.  Tense your abdominals and slowly lift your trunk high enough to just clear your shoulder blades. Lifting higher can put excessive stress on the low back and does not further strengthen your abdominal muscles.  Control your return to the starting position. Repeat __________ times. Complete this exercise __________ times per day.  STRENGTHENING - Quadruped, Opposite UE/LE Lift   Assume a hands and knees position on a firm surface. Keep your hands under your shoulders and your knees under your hips. You may place padding under your knees for comfort.  Find your neutral spine and gently tense your abdominal muscles so that you can maintain this position. Your shoulders and hips should form a rectangle that is parallel with the floor and is not twisted.  Keeping your trunk steady, lift your right hand no higher than your shoulder and then your left leg no higher than your hip. Make sure you are not holding your breath. Hold this position  __________ seconds.  Continuing to keep your abdominal muscles tense and your back steady, slowly return to your starting position. Repeat with the opposite arm and leg. Repeat __________ times. Complete this exercise __________ times per day. Document Released: 07/06/2005 Document Revised: 09/10/2011 Document Reviewed: 09/30/2008 [facility] Patient Information 2015 [place], [place]. This information is not intended to replace advice given to you by your health care provider. Make sure you discuss any questions you have with your health care provider.   Burn Care Your skin is a natural barrier to infection. It is the largest organ of your body. Burns damage this natural protection. To help prevent infection, it is very important to follow your caregiver's instructions in the care of your burn. Burns are classified as:  First degree. There is only redness of the skin (erythema). No scarring is expected.  Second degree. There is blistering of the skin. Scarring may occur with deeper burns.  Third degree. All layers of the skin are injured, and scarring is expected. HOME  CARE INSTRUCTIONS   Wash your hands well before changing your bandage.  Change your bandage as often as directed by your caregiver.  Remove the old bandage. If the bandage sticks, you may soak it off with cool, clean water.  Cleanse the burn thoroughly but gently with mild soap and water.  Pat the area dry with a clean, dry cloth.  Apply a thin layer of antibacterial cream to the burn.  Apply a clean bandage as instructed by your caregiver.  Keep the bandage as clean and dry as possible.  Elevate the affected area for the first 24 hours, then as instructed by your caregiver.  Only take over-the-counter or prescription medicines for pain, discomfort, or fever as directed by your caregiver. SEEK IMMEDIATE MEDICAL CARE IF:   You develop excessive pain.  You develop redness, tenderness, swelling, or red streaks near  the burn.  The burned area develops yellowish-white fluid (pus) or a bad smell.  You have a fever. MAKE SURE YOU:   Understand these instructions.  Will watch your condition.  Will get help right away if you are not doing well or get worse. Document Released: 06/18/2005 Document Revised: 09/10/2011 Document Reviewed: 11/08/2010 [facility] Patient Information 2015 [place], [place]. This information is not intended to replace advice given to you by your health care provider. Make sure you discuss any questions you have with your health care provider.

## 2014-08-07 NOTE — ED Provider Notes (Signed)
Tamara [name] is a 54 y.o. female who presents to Urgent Care today for burn to the left hand and back pain.  1) patient suffered a burn to the dorsal aspect of her left hand about a week ago. Patient poured hot water on her hand on accident. She applied antibiotic ointment and a dressing. Her daughter is a Marine scientist and recommended that she seek treatment for this issue. Her last tetanus vaccination was less than 5 years ago. No radiating pain weakness or numbness into her hands.  2) low back pain. Patient has chronic mild intermittent low back pain. She was doing well until this morning when she developed severe onset of low back pain. The pain is 10 out of 10 and does not radiate. No weakness or numbness bowel bladder dysfunction or difficulty walking. She notes that she has been doing a lot of sitting recently caring for her sick mother and thinks this has something to do with her pain. She has tried no medications yet.   Past Medical History  Diagnosis Date  . Depression   . HTN (hypertension)   . Hyperlipemia   . Allergy   . GERD (gastroesophageal reflux disease)   . Neuromuscular disorder   . Anemia   . Anxiety   . Asthma   . Shock therapy as cause of abnormal reaction of patient or of later complication without mention of misadventure at time of procedure   . History of short term memory loss   . Diverticulitis    Past Surgical History  Procedure Laterality Date  . Cholecystectomy    . Abdominal hysterectomy      partial  . Colonoscopy     History  Substance Use Topics  . Smoking status: Never Smoker   . Smokeless tobacco: Never Used  . Alcohol Use: No   ROS as above Medications: Current Facility-Administered Medications  Medication Dose Route Frequency Provider Last Rate Last Dose  . silver sulfADIAZINE (SILVADENE) 1 % cream   Topical Once Gregor Hams, MD       Current Outpatient Prescriptions  Medication Sig Dispense Refill  . albuterol (PROVENTIL HFA;VENTOLIN  HFA) 108 (90 BASE) MCG/ACT inhaler Inhale 2 puffs into the lungs every 6 (six) hours as needed for wheezing or shortness of breath. 1 Inhaler 3  . ALPRAZolam (XANAX) 1 MG tablet Take 1 tablet (1 mg total) by mouth daily as needed for anxiety. 30 tablet 3  . ALPRAZOLAM XR 1 MG 24 hr tablet Take 3 mg by mouth at bedtime.     Marland Kitchen aspirin 325 MG tablet Take 1 tablet (325 mg total) by mouth daily. 30 tablet 0  . cyclobenzaprine (FLEXERIL) 10 MG tablet Take 1 tablet (10 mg total) by mouth at bedtime as needed for muscle spasms. 20 tablet 0  . dexlansoprazole (DEXILANT) 60 MG capsule Take 1 capsule (60 mg total) by mouth daily. 90 capsule 3  . diclofenac sodium (VOLTAREN) 1 % GEL Apply 2 g topically daily as needed (for pain). 100 g 3  . fenofibrate (TRICOR) 145 MG tablet Take 1 tablet (145 mg total) by mouth daily. 90 tablet 1  . LYSINE PO Take 1 tablet by mouth as needed. prn    . Oxcarbazepine (TRILEPTAL) 300 MG tablet Take 300 mg by mouth 2 (two) times daily.    . silver sulfADIAZINE (SILVADENE) 1 % cream Apply topically 2 (two) times daily. 50 g 0  . temazepam (RESTORIL) 15 MG capsule Take 45 mg by mouth at  bedtime.    . Vilazodone HCl 20 MG TABS Take 20 mg by mouth daily.      Allergies  Allergen Reactions  . Adhesive [Tape]     This is tape as well as adhesive on bandaids.  . Gabapentin     Unknown reaction  . Ivp Dye [Iodinated Diagnostic Agents] Swelling  . Oxycodone   . Propoxyphene Hcl Other (See Comments)    hallucination  . Seroquel [Quetiapine Fumerate]     Unknown reaction  . Statins     Arm pain  . Penicillins Rash  . Sulfonamide Derivatives Rash     Exam:  BP 118/84 mmHg  Pulse 106  Temp(Src) 97.9 F (36.6 C) (Oral)  Ht 5\' 2"  (1.575 m)  SpO2 99% Gen: Well NAD HEENT: EOMI,  MMM Lungs: Normal work of breathing. CTABL Heart: RRR no MRG Abd: NABS, Soft. Nondistended, Nontender Exts: Brisk capillary refill, warm and well perfused.  Back: Nontender to spinal midline  tenderness palpation bilateral lumbar paraspinals. Range of motion limited by pain. Negative straight leg raise test bilaterally. Positive Fabere and pretzel stretch right side. Negative left. Reflexes are equal and normal bilateral lower extremities. Strength is intact throughout. Sensation is intact throughout. Mild antalgic gait. Ex  Skin left hand dorsal aspect well-appearing new pink skin emerging. Burn is on dorsal aspect of digits 2, 3 and 4 Hand motion is normal as is sensation and capillary refill distally      No results found for this or any previous visit (from the past 24 hour(s)). No results found.  Assessment and Plan: 54 y.o. female with  1) improving partial-thickness burn to the dorsal left hand. Treat with Silvadene cream watchful waiting. Follow-up as needed. Tetanus up-to-date.   2) lumbago without sciatica. Pain due to myofascial strain. Treat with over-the-counter NSAIDs and Flexeril. Return as needed. Recommend heating pad and TENS unit as possible.  Discussed warning signs or symptoms. Please see discharge instructions. Patient expresses understanding.     Gregor Hams, MD 08/07/14 [phone]

## 2014-08-10 DIAGNOSIS — G43709 Chronic migraine without aura, not intractable, without status migrainosus: Secondary | ICD-10-CM | POA: Diagnosis not present

## 2014-08-11 DIAGNOSIS — R51 Headache: Secondary | ICD-10-CM | POA: Diagnosis not present

## 2014-08-11 DIAGNOSIS — F331 Major depressive disorder, recurrent, moderate: Secondary | ICD-10-CM | POA: Diagnosis not present

## 2014-08-11 DIAGNOSIS — M9901 Segmental and somatic dysfunction of cervical region: Secondary | ICD-10-CM | POA: Diagnosis not present

## 2014-08-17 DIAGNOSIS — F331 Major depressive disorder, recurrent, moderate: Secondary | ICD-10-CM | POA: Diagnosis not present

## 2014-08-18 ENCOUNTER — Telehealth: Payer: Self-pay

## 2014-08-18 DIAGNOSIS — F331 Major depressive disorder, recurrent, moderate: Secondary | ICD-10-CM | POA: Diagnosis not present

## 2014-08-18 NOTE — Telephone Encounter (Signed)
Patient is calling to let us know that prescriptions can be sent to a new mail order pharmacy called [place] [address].

## 2014-08-20 NOTE — Telephone Encounter (Signed)
I found the pharmacy,I changed it in her pharmacy choice.

## 2014-08-24 DIAGNOSIS — F331 Major depressive disorder, recurrent, moderate: Secondary | ICD-10-CM | POA: Diagnosis not present

## 2014-08-25 DIAGNOSIS — M9901 Segmental and somatic dysfunction of cervical region: Secondary | ICD-10-CM | POA: Diagnosis not present

## 2014-08-25 DIAGNOSIS — R51 Headache: Secondary | ICD-10-CM | POA: Diagnosis not present

## 2014-08-26 DIAGNOSIS — F331 Major depressive disorder, recurrent, moderate: Secondary | ICD-10-CM | POA: Diagnosis not present

## 2014-08-29 DIAGNOSIS — M9901 Segmental and somatic dysfunction of cervical region: Secondary | ICD-10-CM | POA: Diagnosis not present

## 2014-08-29 DIAGNOSIS — R51 Headache: Secondary | ICD-10-CM | POA: Diagnosis not present

## 2014-08-30 ENCOUNTER — Institutional Professional Consult (permissible substitution): Payer: Medicare Other | Admitting: Cardiovascular Disease

## 2014-08-31 DIAGNOSIS — F331 Major depressive disorder, recurrent, moderate: Secondary | ICD-10-CM | POA: Diagnosis not present

## 2014-09-01 ENCOUNTER — Telehealth: Payer: Self-pay

## 2014-09-01 NOTE — Telephone Encounter (Signed)
Received fax from [facility] advising that pt has been accepted into their program and will be able to receive free medication through 07/02/15. Rxs will be sent directly to pt and should be ordered in 90 day supplies.

## 2014-09-02 ENCOUNTER — Ambulatory Visit (INDEPENDENT_AMBULATORY_CARE_PROVIDER_SITE_OTHER): Payer: Medicare Other | Admitting: Family Medicine

## 2014-09-02 ENCOUNTER — Encounter: Payer: Self-pay | Admitting: Family Medicine

## 2014-09-02 VITALS — BP 164/95 | HR 88 | Temp 98.0°F | Resp 16 | Ht 62.5 in | Wt 182.0 lb

## 2014-09-02 DIAGNOSIS — E559 Vitamin D deficiency, unspecified: Secondary | ICD-10-CM

## 2014-09-02 DIAGNOSIS — E042 Nontoxic multinodular goiter: Secondary | ICD-10-CM | POA: Diagnosis not present

## 2014-09-02 DIAGNOSIS — K219 Gastro-esophageal reflux disease without esophagitis: Secondary | ICD-10-CM

## 2014-09-02 DIAGNOSIS — L710 Perioral dermatitis: Secondary | ICD-10-CM | POA: Diagnosis not present

## 2014-09-02 DIAGNOSIS — Z0000 Encounter for general adult medical examination without abnormal findings: Secondary | ICD-10-CM | POA: Diagnosis not present

## 2014-09-02 DIAGNOSIS — F331 Major depressive disorder, recurrent, moderate: Secondary | ICD-10-CM | POA: Diagnosis not present

## 2014-09-02 DIAGNOSIS — G4700 Insomnia, unspecified: Secondary | ICD-10-CM | POA: Diagnosis not present

## 2014-09-02 DIAGNOSIS — R739 Hyperglycemia, unspecified: Secondary | ICD-10-CM

## 2014-09-02 DIAGNOSIS — I10 Essential (primary) hypertension: Secondary | ICD-10-CM

## 2014-09-02 DIAGNOSIS — E785 Hyperlipidemia, unspecified: Secondary | ICD-10-CM | POA: Diagnosis not present

## 2014-09-02 DIAGNOSIS — G44209 Tension-type headache, unspecified, not intractable: Secondary | ICD-10-CM

## 2014-09-02 DIAGNOSIS — J4520 Mild intermittent asthma, uncomplicated: Secondary | ICD-10-CM

## 2014-09-02 LAB — COMPLETE METABOLIC PANEL WITH GFR
ALT: 25 U/L (ref 0–35)
AST: 25 U/L (ref 0–37)
Albumin: 4.5 g/dL (ref 3.5–5.2)
Alkaline Phosphatase: 69 U/L (ref 39–117)
BUN: 21 mg/dL (ref 6–23)
CO2: 25 mEq/L (ref 19–32)
Calcium: 10.1 mg/dL (ref 8.4–10.5)
Chloride: 102 mEq/L (ref 96–112)
Creat: 1.14 mg/dL — ABNORMAL HIGH (ref 0.50–1.10)
GFR, Est African American: 64 mL/min
GFR, Est Non African American: 55 mL/min — ABNORMAL LOW
Glucose, Bld: 87 mg/dL (ref 70–99)
Potassium: 3.9 mEq/L (ref 3.5–5.3)
Sodium: 138 mEq/L (ref 135–145)
Total Bilirubin: 0.9 mg/dL (ref 0.2–1.2)
Total Protein: 7.6 g/dL (ref 6.0–8.3)

## 2014-09-02 LAB — LIPID PANEL
Cholesterol: 256 mg/dL — ABNORMAL HIGH (ref 0–200)
HDL: 54 mg/dL (ref >=46)
LDL Cholesterol: 177 mg/dL — ABNORMAL HIGH (ref 0–99)
Total CHOL/HDL Ratio: 4.7 Ratio
Triglycerides: 124 mg/dL (ref <150)
VLDL: 25 mg/dL (ref 0–40)

## 2014-09-02 LAB — CBC WITH DIFFERENTIAL/PLATELET
Basophils Absolute: 0.1 10*3/uL (ref 0.0–0.1)
Basophils Relative: 1 % (ref 0–1)
Eosinophils Absolute: 0.2 10*3/uL (ref 0.0–0.7)
Eosinophils Relative: 3 % (ref 0–5)
HCT: 40.3 % (ref 36.0–46.0)
Hemoglobin: 14 g/dL (ref 12.0–15.0)
Lymphocytes Relative: 47 % — ABNORMAL HIGH (ref 12–46)
Lymphs Abs: 2.4 10*3/uL (ref 0.7–4.0)
MCH: 29.4 pg (ref 26.0–34.0)
MCHC: 34.7 g/dL (ref 30.0–36.0)
MCV: 84.7 fL (ref 78.0–100.0)
MPV: 9.7 fL (ref 8.6–12.4)
Monocytes Absolute: 0.4 10*3/uL (ref 0.1–1.0)
Monocytes Relative: 8 % (ref 3–12)
Neutro Abs: 2.1 10*3/uL (ref 1.7–7.7)
Neutrophils Relative %: 41 % — ABNORMAL LOW (ref 43–77)
Platelets: 318 10*3/uL (ref 150–400)
RBC: 4.76 MIL/uL (ref 3.87–5.11)
RDW: 13.2 % (ref 11.5–15.5)
WBC: 5.2 10*3/uL (ref 4.0–10.5)

## 2014-09-02 LAB — POCT URINALYSIS DIPSTICK
Bilirubin, UA: NEGATIVE
Blood, UA: NEGATIVE
Glucose, UA: NEGATIVE
Ketones, UA: NEGATIVE
Leukocytes, UA: NEGATIVE
Nitrite, UA: NEGATIVE
Protein, UA: NEGATIVE
Spec Grav, UA: 1.015
Urobilinogen, UA: 0.2
pH, UA: 7.5

## 2014-09-02 LAB — POCT GLYCOSYLATED HEMOGLOBIN (HGB A1C): Hemoglobin A1C: 5

## 2014-09-02 LAB — TSH: TSH: 0.668 u[IU]/mL (ref 0.350–4.500)

## 2014-09-02 MED ORDER — DEXLANSOPRAZOLE 60 MG PO CPDR: 60.0000 mg | DELAYED_RELEASE_CAPSULE | Freq: Every day | ORAL | Status: DC

## 2014-09-02 MED ORDER — BACLOFEN 10 MG PO TABS: 10.0000 mg | ORAL_TABLET | Freq: Three times a day (TID) | ORAL | Status: DC

## 2014-09-02 MED ORDER — ALBUTEROL SULFATE HFA 108 (90 BASE) MCG/ACT IN AERS: 2.0000 | INHALATION_SPRAY | Freq: Four times a day (QID) | RESPIRATORY_TRACT | Status: DC | PRN

## 2014-09-02 MED ORDER — ALPRAZOLAM 1 MG PO TABS: 1.0000 mg | ORAL_TABLET | Freq: Every day | ORAL | Status: DC | PRN

## 2014-09-02 MED ORDER — TEMAZEPAM 15 MG PO CAPS: 45.0000 mg | ORAL_CAPSULE | Freq: Every day | ORAL | Status: DC

## 2014-09-02 MED ORDER — ALPRAZOLAM ER 1 MG PO TB24: 3.0000 mg | ORAL_TABLET | Freq: Every day | ORAL | Status: DC

## 2014-09-02 MED ORDER — DOXYCYCLINE HYCLATE 100 MG PO CAPS: 100.0000 mg | ORAL_CAPSULE | Freq: Two times a day (BID) | ORAL | Status: DC

## 2014-09-02 MED ORDER — OXCARBAZEPINE 300 MG PO TABS: 300.0000 mg | ORAL_TABLET | Freq: Two times a day (BID) | ORAL | Status: DC

## 2014-09-02 MED ORDER — DICLOFENAC SODIUM 1 % TD GEL: 2.0000 g | Freq: Every day | TRANSDERMAL | Status: DC | PRN

## 2014-09-02 MED ORDER — FENOFIBRATE 145 MG PO TABS: 145.0000 mg | ORAL_TABLET | Freq: Every day | ORAL | Status: DC

## 2014-09-02 NOTE — Patient Instructions (Signed)

## 2014-09-02 NOTE — Progress Notes (Addendum)
Subjective:  This chart was scribed for [name], MD by Mercy Moore, Medial Scribe. This patient was seen in room 26 and the patient's care was started at 11:01 AM.    Patient ID: Tamara [name], female    DOB: 07/09/1960, 54 y.o.   MRN: [id number]  Chief Complaint  Patient presents with  . Annual Exam    no pap    HPI HPI Comments: Tamara [name] is a 54 y.o. female with an extensive PMHx who presents to the [facility] for her annual exam.  Patient with history of migraine states that she woke up with a headache this morning. Patient states that she is conservative with medications for her headaches, because of the array of medications she takes. Patient states that sees a chiropractor and masseuse for headache relief. Patient reports that her headache doctor put her on Baclofen prn; she denies effectiveness. Patient reports that the doctor suggested Botox and provided her with a list of vitamins; she refused.  After recent lab work, patient's doctor told her that her sugar was high.    Patient reports recent ultrasound of her thyroid: with multiple nodules found. Patient's doctor told her she would not need an evaluation for three years, but this was concerning for her.  Patient states that she has annual eye and dental visits.   Patient's boyfriend "Merry Proud" has MS.  Patient shares an interest in birds and bird watching.  Patient is enrolled in aromatherapy with cannibis extract, frankcense, peptogest Patient Active Problem List   Diagnosis Date Noted  . ANXIETY 09/17/2007    Priority: High  . DEPRESSION 09/17/2007    Priority: High  . Essential hypertension 09/17/2007    Priority: High  . Abnormal EKG 08/31/2011    Priority: Medium  . ASTHMA 08/01/2010    Priority: Medium  . HYPERLIPIDEMIA 09/17/2007    Priority: Medium  . OBESITY 09/17/2007    Priority: Medium  . GERD 09/17/2007    Priority: Medium  . HEMORRHOIDS-EXTERNAL 05/30/2010   Priority: Low  . ALLERGIC RHINITIS 04/14/2008    Priority: Low  . ACNE, MILD 09/17/2007    Priority: Low  . Migraine 11/05/2013  . Stroke 10/22/2013  . Disturbance of skin sensation 10/21/2013   Past Medical History  Diagnosis Date  . Depression   . HTN (hypertension)   . Hyperlipemia   . Allergy   . GERD (gastroesophageal reflux disease)   . Neuromuscular disorder   . Anemia   . Anxiety   . Asthma   . Shock therapy as cause of abnormal reaction of patient or of later complication without mention of misadventure at time of procedure   . History of short term memory loss   . Diverticulitis    Past Surgical History  Procedure Laterality Date  . Cholecystectomy    . Abdominal hysterectomy      partial  . Colonoscopy     Allergies  Allergen Reactions  . Adhesive [Tape]     This is tape as well as adhesive on bandaids.  . Gabapentin     Unknown reaction  . Ivp Dye [Iodinated Diagnostic Agents] Swelling  . Oxycodone   . Propoxyphene Hcl Other (See Comments)    hallucination  . Seroquel [Quetiapine Fumerate]     Unknown reaction  . Statins     Arm pain  . Penicillins Rash  . Sulfonamide Derivatives Rash   Prior to Admission medications   Medication Sig Start Date End  Date Taking? Authorizing Provider  albuterol (PROVENTIL HFA;VENTOLIN HFA) 108 (90 BASE) MCG/ACT inhaler Inhale 2 puffs into the lungs every 6 (six) hours as needed for wheezing or shortness of breath. 06/18/14   [name], MD  ALPRAZolam Duanne Moron) 1 MG tablet Take 1 tablet (1 mg total) by mouth daily as needed for anxiety. 03/16/14   [name], MD  ALPRAZOLAM XR 1 MG 24 hr tablet Take 3 mg by mouth at bedtime.  08/10/11   Historical Provider, MD  aspirin 325 MG tablet Take 1 tablet (325 mg total) by mouth daily. 10/22/13   Bernadene Bell, MD  cyclobenzaprine (FLEXERIL) 10 MG tablet Take 1 tablet (10 mg total) by mouth at bedtime as needed for muscle spasms. 08/07/14   Gregor Hams, MD  dexlansoprazole  (DEXILANT) 60 MG capsule Take 1 capsule (60 mg total) by mouth daily. 06/18/14   [name], MD  diclofenac sodium (VOLTAREN) 1 % GEL Apply 2 g topically daily as needed (for pain). 06/18/14   [name], MD  fenofibrate (TRICOR) 145 MG tablet Take 1 tablet (145 mg total) by mouth daily. 06/18/14   [name], MD  LYSINE PO Take 1 tablet by mouth as needed. prn    Historical Provider, MD  Oxcarbazepine (TRILEPTAL) 300 MG tablet Take 300 mg by mouth 2 (two) times daily.    Historical Provider, MD  silver sulfADIAZINE (SILVADENE) 1 % cream Apply topically 2 (two) times daily. 08/07/14   Gregor Hams, MD  temazepam (RESTORIL) 15 MG capsule Take 45 mg by mouth at bedtime.    Historical Provider, MD  Vilazodone HCl 20 MG TABS Take 20 mg by mouth daily.     Historical Provider, MD   History   Social History  . Marital Status: Divorced    Spouse Name: N/A  . Number of Children: 3  . Years of Education: college   Occupational History  .      Disabled   Social History Main Topics  . Smoking status: Never Smoker   . Smokeless tobacco: Never Used  . Alcohol Use: No  . Drug Use: No  . Sexual Activity: Yes    Birth Control/ Protection: None   Other Topics Concern  . Not on file   Social History Narrative   Lives with boyfriend. Walks daily for 20 minutes. Education: [employer].    Review of Systems     Objective:   Physical Exam  Nursing note and vitals reviewed. HEENT: normal fundi, TM's, oroph, general appearance Neck:  Supple, no palpable thyromegaly, no adenopathy Chest:  Clear Breast (done by Dr. Benjie Karvonen) Heart:  Reg, no murmur or gallop Abdomen:  Overweight, no HSM, no mass or tenderness,no guarding or rebound, good fem pulses Skin: no rash Neuro: normal CN III-XII, motor exam, reflexes are diminished in ankles and knees. Ext:  Good pedal pulses, no edema  Filed Vitals:   09/02/14 1032  BP: 164/95  Pulse: 88  Temp: 98 F (36.7 C)  TempSrc: Oral  Resp: 16    Height: 5' 2.5" (1.588 m)  Weight: 182 lb (82.555 kg)  SpO2: 98%        Assessment & Plan:   This chart was scribed in my presence and reviewed by me personally.    ICD-9-CM ICD-10-CM   1. Annual physical exam V70.0 Z00.00 TSH     COMPLETE METABOLIC PANEL WITH GFR     POCT urinalysis dipstick     POCT glycosylated hemoglobin (Hb A1C)  2. Multinodular goiter 241.1 E04.2 TSH  3. Hyperglycemia 790.29 R73.9 POCT urinalysis dipstick     POCT glycosylated hemoglobin (Hb A1C)  4. Essential hypertension 401.9 I10 CBC with Differential/Platelet  5. Hyperlipidemia 272.4 E78.5 Lipid panel     fenofibrate (TRICOR) 145 MG tablet  6. Tension-type headache, not intractable, unspecified chronicity pattern 339.10 G44.209 Oxcarbazepine (TRILEPTAL) 300 MG tablet     diclofenac sodium (VOLTAREN) 1 % GEL  7. Gastroesophageal reflux disease, esophagitis presence not specified 530.81 K21.9 dexlansoprazole (DEXILANT) 60 MG capsule  8. Insomnia 780.52 G47.00 temazepam (RESTORIL) 15 MG capsule     ALPRAZolam (XANAX) 1 MG tablet  9. Asthma, chronic, mild intermittent, uncomplicated 597.41 U38.45 albuterol (PROVENTIL HFA;VENTOLIN HFA) 108 (90 BASE) MCG/ACT inhaler  10. Vitamin D deficiency 268.9 E55.9   11. Perioral dermatitis 695.3 L71.0 doxycycline (VIBRAMYCIN) 100 MG capsule  12. Gastroesophageal reflux disease without esophagitis 530.81 K21.9      Signed, [name], MD

## 2014-09-03 DIAGNOSIS — M9901 Segmental and somatic dysfunction of cervical region: Secondary | ICD-10-CM | POA: Diagnosis not present

## 2014-09-03 DIAGNOSIS — R51 Headache: Secondary | ICD-10-CM | POA: Diagnosis not present

## 2014-09-07 DIAGNOSIS — F331 Major depressive disorder, recurrent, moderate: Secondary | ICD-10-CM | POA: Diagnosis not present

## 2014-09-09 DIAGNOSIS — F331 Major depressive disorder, recurrent, moderate: Secondary | ICD-10-CM | POA: Diagnosis not present

## 2014-09-14 DIAGNOSIS — F331 Major depressive disorder, recurrent, moderate: Secondary | ICD-10-CM | POA: Diagnosis not present

## 2014-09-17 ENCOUNTER — Telehealth: Payer: Self-pay

## 2014-09-17 DIAGNOSIS — E559 Vitamin D deficiency, unspecified: Secondary | ICD-10-CM

## 2014-09-17 NOTE — Telephone Encounter (Signed)
Pt is wondering if we could do a lab draw only for a Vit D. She says that you told her that you were going to check this, but I don't see this anywhere. Please advise. Thanks

## 2014-09-17 NOTE — Telephone Encounter (Signed)
I put this down on the encounter sheet.  Anyway, please order this.. I tried to order in EPIC but was unable to do it.

## 2014-09-17 NOTE — Telephone Encounter (Signed)
Pt will stop in for a lab only. Ordered

## 2014-09-21 ENCOUNTER — Ambulatory Visit (INDEPENDENT_AMBULATORY_CARE_PROVIDER_SITE_OTHER): Payer: Medicare Other | Admitting: Family Medicine

## 2014-09-21 ENCOUNTER — Other Ambulatory Visit: Payer: Self-pay | Admitting: Family Medicine

## 2014-09-21 VITALS — BP 118/80 | HR 95 | Temp 97.9°F | Resp 16 | Ht 62.5 in | Wt 182.5 lb

## 2014-09-21 DIAGNOSIS — E785 Hyperlipidemia, unspecified: Secondary | ICD-10-CM | POA: Diagnosis not present

## 2014-09-21 DIAGNOSIS — E559 Vitamin D deficiency, unspecified: Secondary | ICD-10-CM

## 2014-09-21 NOTE — Patient Instructions (Addendum)
I would take either ranitidine or pepcid on the day you don't take the PPI (dexilant)      *[facility]*           [address], [place] [address]            [phone]  ------------------------------------------------------------ Noninvasive Vascular Lab  Carotid Duplex Study  Patient:  Tamara [name], Tamara [name] MR #:    [id number] Study Date: 10/22/2013 Gender:   F Age:    54 Height: Weight: BSA: Pt. Status: Room:    3W11C  ADMITTING  Zigmund Gottron ATTENDING  Hensel, Rolly Pancake SONOGRAPHER Ralene Cork, RVT Reports also to:  ------------------------------------------------------------ History and indications:  Indications  781.4 Transient paralysis of limb. History  Diagnostic evaluation. Left sided numbness  ------------------------------------------------------------ Study information:  Study status: Routine. Procedure: A vascular evaluation was performed with the patient in the supine position. The right CCA, right ECA, right ICA, right vertebral, left CCA, left ECA, left ICA, and left vertebral arteries were examined.  Carotid duplex study.   Carotid Duplex exam including 2D imaging, color and spectral Doppler were performed on the extracranial carotid and vertebral arteries using standard established protocols. Location: Vascular laboratory. Patient status: Inpatient.  Arterial flow:  +--------------+--------+-------+-------------+------------+ Location   V sys  V ed  Flow analysisComment    +--------------+--------+-------+-------------+------------+ Right CCA -  94cm/s 29cm/s ------------------------- proximal                         +--------------+--------+-------+-------------+------------+ Right CCA -   59cm/s 19cm/s ------------------------- distal                          +--------------+--------+-------+-------------+------------+ Right ECA   -82cm/s -21cm/s------------------------- +--------------+--------+-------+-------------+------------+ Right ICA -  -58cm/s -18cm/s-------------Minimal    proximal                  plaque    +--------------+--------+-------+-------------+------------+ Right ICA -  -71cm/s -37cm/s------------------------- distal                          +--------------+--------+-------+-------------+------------+ Right     42cm/s 18cm/s Antegrade  ------------ vertebral           flow            +--------------+--------+-------+-------------+------------+ Left CCA -  79cm/s 24cm/s ------------------------- proximal                         +--------------+--------+-------+-------------+------------+ Left CCA -  78cm/s 33cm/s ------------------------- distal                          +--------------+--------+-------+-------------+------------+ Left ECA   -102cm/s-31cm/s------------------------- +--------------+--------+-------+-------------+------------+ Left ICA -  -58cm/s -28cm/s-------------Minimal    proximal                  plaque    +--------------+--------+-------+-------------+------------+ Left ICA -  -61cm/s -28cm/s------------------------- distal                          +--------------+--------+-------+-------------+------------+ Left vertebral31cm/s 10cm/s Antegrade  ------------                flow             +--------------+--------+-------+-------------+------------+  ------------------------------------------------------------  Summary:  - The vertebral arteries appear patent with antegrade flow. - Findings consistent with 1-39 percent stenosis involving the right internal carotid artery and the left internal carotid artery. Other specific details can be found in the table(s) above.  Prepared and Electronically Authenticated by  Antony Contras 2015-04-24T09:00:01.170  Results for orders placed or performed in visit on 09/02/14  TSH  Result Value Ref Range   TSH 0.668 0.350 - 4.500 uIU/mL  CBC with Differential/Platelet  Result Value Ref Range   WBC 5.2 4.0 - 10.5 K/uL   RBC 4.76 3.87 - 5.11 MIL/uL   Hemoglobin 14.0 12.0 - 15.0 g/dL   HCT 40.3 36.0 - 46.0 %   MCV 84.7 78.0 - 100.0 fL   MCH 29.4 26.0 - 34.0 pg   MCHC 34.7 30.0 - 36.0 g/dL   RDW 13.2 11.5 - 15.5 %   Platelets 318 150 - 400 K/uL   MPV 9.7 8.6 - 12.4 fL   Neutrophils Relative % 41 (L) 43 - 77 %   Neutro Abs 2.1 1.7 - 7.7 K/uL   Lymphocytes Relative 47 (H) 12 - 46 %   Lymphs Abs 2.4 0.7 - 4.0 K/uL   Monocytes Relative 8 3 - 12 %   Monocytes Absolute 0.4 0.1 - 1.0 K/uL   Eosinophils Relative 3 0 - 5 %   Eosinophils Absolute 0.2 0.0 - 0.7 K/uL   Basophils Relative 1 0 - 1 %   Basophils Absolute 0.1 0.0 - 0.1 K/uL   Smear Review Criteria for review not met   Lipid panel  Result Value Ref Range   Cholesterol 256 (H) 0 - 200 mg/dL   Triglycerides 124 <150 mg/dL   HDL 54 >=46 mg/dL   Total CHOL/HDL Ratio 4.7 Ratio   VLDL 25 0 - 40 mg/dL   LDL Cholesterol 177 (H) 0 - 99 mg/dL  COMPLETE METABOLIC PANEL WITH GFR  Result Value Ref Range   Sodium 138 135 - 145 mEq/L   Potassium 3.9 3.5 - 5.3 mEq/L   Chloride 102 96 - 112 mEq/L   CO2 25 19 - 32 mEq/L   Glucose, Bld 87 70 - 99 mg/dL   BUN 21 6 - 23 mg/dL   Creat 1.14 (H) 0.50 - 1.10 mg/dL   Total Bilirubin 0.9 0.2 - 1.2 mg/dL   Alkaline Phosphatase 69  39 - 117 U/L   AST 25 0 - 37 U/L   ALT 25 0 - 35 U/L   Total Protein 7.6 6.0 - 8.3 g/dL   Albumin 4.5 3.5 - 5.2 g/dL   Calcium 10.1 8.4 - 10.5 mg/dL   GFR, Est African American 64 mL/min   GFR, Est Non African American 55 (L) mL/min  POCT urinalysis dipstick  Result Value Ref Range   Color, UA yellow    Clarity, UA clear    Glucose, UA neg    Bilirubin, UA neg    Ketones, UA neg    Spec Grav, UA 1.015    Blood, UA neg    pH, UA 7.5    Protein, UA neg    Urobilinogen, UA 0.2    Nitrite, UA neg    Leukocytes, UA Negative   POCT glycosylated hemoglobin (Hb A1C)  Result Value Ref Range   Hemoglobin A1C 5.0

## 2014-09-21 NOTE — Progress Notes (Signed)
Subjective:  This chart was scribed for [name], MD by Molli Posey, Medical scribe. This patient was seen in ROOM 1 and the patient's care was started 5:39 PM.  Patient ID: Tamara [name], female    DOB: 12-Jun-1961, 54 y.o.   MRN: [id number]   Chief Complaint  Patient presents with   Gastrophageal Reflux    pt stated  that she is trying to come off some medications and wanted to speak with Dr. Carlean Jews   HPI HPI Comments: Tamara [name] is a 54 y.o. female with a history of asthma, hyperlipidemia and GERD who presents to [facility] for her gastroesophageal reflux medication management. Pt states that she is trying to decrease her dosage of dexilant and is now taking it every other day. She states that she has added a few new medications recently for her GERD symptoms. She states that she has started to exercise more and is currently on a 10 day cleanse of no sugar, no dairy and no grain to try and improve her cholesterol. She says that she would also like to have her Vitamin D checked today.   Patient Active Problem List   Diagnosis Date Noted   Migraine 11/05/2013   Stroke 10/22/2013   Disturbance of skin sensation 10/21/2013   Abnormal EKG 08/31/2011   ASTHMA 08/01/2010   HEMORRHOIDS-EXTERNAL 05/30/2010   ALLERGIC RHINITIS 04/14/2008   HYPERLIPIDEMIA 09/17/2007   OBESITY 09/17/2007   ANXIETY 09/17/2007   DEPRESSION 09/17/2007   Essential hypertension 09/17/2007   GERD 09/17/2007   ACNE, MILD 09/17/2007   Past Medical History  Diagnosis Date   Depression    HTN (hypertension)    Hyperlipemia    Allergy    GERD (gastroesophageal reflux disease)    Neuromuscular disorder    Anemia    Anxiety    Asthma    Shock therapy as cause of abnormal reaction of patient or of later complication without mention of misadventure at time of procedure    History of short term memory loss    Diverticulitis    Allergies  Allergen Reactions   Adhesive  [Tape]     This is tape as well as adhesive on bandaids.   Gabapentin     Unknown reaction   Ivp Dye [Iodinated Diagnostic Agents] Swelling   Oxycodone    Propoxyphene Hcl Other (See Comments)    hallucination   Seroquel [Quetiapine Fumerate]     Unknown reaction   Statins     Arm pain   Penicillins Rash   Sulfonamide Derivatives Rash   Current Outpatient Prescriptions on File Prior to Visit  Medication Sig Dispense Refill   albuterol (PROVENTIL HFA;VENTOLIN HFA) 108 (90 BASE) MCG/ACT inhaler Inhale 2 puffs into the lungs every 6 (six) hours as needed for wheezing or shortness of breath. 1 Inhaler 3   ALPRAZolam (ALPRAZOLAM XR) 1 MG 24 hr tablet Take 3 tablets (3 mg total) by mouth at bedtime. 90 tablet 5   ALPRAZolam (XANAX) 1 MG tablet Take 1 tablet (1 mg total) by mouth daily as needed for anxiety. 30 tablet 3   aspirin 325 MG tablet Take 1 tablet (325 mg total) by mouth daily. 30 tablet 0   baclofen (LIORESAL) 10 MG tablet Take 1 tablet (10 mg total) by mouth 3 (three) times daily. 30 each 11   cyclobenzaprine (FLEXERIL) 10 MG tablet Take 1 tablet (10 mg total) by mouth at bedtime as needed for muscle spasms. 20 tablet 0   dexlansoprazole (  DEXILANT) 60 MG capsule Take 1 capsule (60 mg total) by mouth daily. 90 capsule 3   diclofenac sodium (VOLTAREN) 1 % GEL Apply 2 g topically daily as needed (for pain). 100 g 3   doxycycline (VIBRAMYCIN) 100 MG capsule Take 1 capsule (100 mg total) by mouth 2 (two) times daily. 180 capsule 3   fenofibrate (TRICOR) 145 MG tablet Take 1 tablet (145 mg total) by mouth daily. 90 tablet 1   LYSINE PO Take 1 tablet by mouth as needed. prn     Oxcarbazepine (TRILEPTAL) 300 MG tablet Take 1 tablet (300 mg total) by mouth 2 (two) times daily. 60 tablet 5   perphenazine (TRILAFON) 4 MG tablet Take 4 mg by mouth 2 (two) times daily.     silver sulfADIAZINE (SILVADENE) 1 % cream Apply topically 2 (two) times daily. 50 g 0   temazepam  (RESTORIL) 15 MG capsule Take 3 capsules (45 mg total) by mouth at bedtime. 90 capsule 5   Vilazodone HCl 20 MG TABS Take 20 mg by mouth daily.      No current facility-administered medications on file prior to visit.   Past Surgical History  Procedure Laterality Date   Cholecystectomy     Abdominal hysterectomy      partial   Colonoscopy     Family History  Problem Relation Age of Onset   Arrhythmia Father    Stroke Father    Hyperlipidemia Father    Heart disease Father    Hypertension Father    Coronary artery disease Maternal Grandfather 40    Died 58   Heart disease Maternal Grandfather    Hypertension Maternal Grandfather    Cancer Mother    Hyperlipidemia Mother    Hypertension Mother    Hearing loss Paternal Grandmother    Hearing loss Paternal Grandfather    Diabetes Paternal Grandfather    Diabetes Daughter    Colon cancer Paternal Uncle    Hyperlipidemia Brother    Hyperlipidemia Maternal Grandmother    History   Social History   Marital Status: Divorced    Spouse Name: N/A   Number of Children: 3   Years of Education: college   Occupational History        Disabled   Social History Main Topics   Smoking status: Never Smoker    Smokeless tobacco: Never Used   Alcohol Use: No   Drug Use: No   Sexual Activity: Yes    Museum/gallery curator: None   Other Topics Concern   Not on file   Social History Narrative   Lives with boyfriend. Walks daily for 20 minutes. Education: [employer].    Review of Systems  All other systems reviewed and are negative.     Objective:   Physical Exam  Constitutional: She is oriented to person, place, and time. She appears well-developed and well-nourished.  HENT:  Head: Normocephalic and atraumatic.  Eyes: Right eye exhibits no discharge. Left eye exhibits no discharge.  Neck: Neck supple. No tracheal deviation present.  Cardiovascular: Normal rate.   Pulmonary/Chest: Effort  normal. No respiratory distress.  Abdominal: She exhibits no distension.  Neurological: She is alert and oriented to person, place, and time.  Skin: Skin is warm and dry.  Psychiatric: She has a normal mood and affect. Her behavior is normal.  Nursing note and vitals reviewed.  Filed Vitals:   09/21/14 1646  BP: 118/80  Pulse: 95  Temp: 97.9 F (36.6 C)  TempSrc: Oral  Resp: 16  Height: 5' 2.5" (1.588 m)  Weight: 182 lb 8 oz (82.781 kg)  SpO2: 98%      Assessment & Plan:   This chart was scribed in my presence and reviewed by me personally.    ICD-9-CM ICD-10-CM   1. Vitamin D deficiency 268.9 E55.9 Vit D  25 hydroxy (rtn osteoporosis monitoring)   I've asked patient to alternate Dexilant with either ranitidine or Pepcid.  We do reviewed her diet and recommended that she stay with him peppermint and acid type foods that are bothering her.  Will also go ahead and check her cholesterol. Signed, [name], MD

## 2014-09-22 LAB — LIPID PANEL
Cholesterol: 245 mg/dL — ABNORMAL HIGH (ref 0–200)
HDL: 50 mg/dL (ref >=46)
LDL Cholesterol: 135 mg/dL — ABNORMAL HIGH (ref 0–99)
Total CHOL/HDL Ratio: 4.9 Ratio
Triglycerides: 301 mg/dL — ABNORMAL HIGH (ref <150)
VLDL: 60 mg/dL — ABNORMAL HIGH (ref 0–40)

## 2014-09-23 DIAGNOSIS — F331 Major depressive disorder, recurrent, moderate: Secondary | ICD-10-CM | POA: Diagnosis not present

## 2014-09-24 LAB — VITAMIN D 25 HYDROXY (VIT D DEFICIENCY, FRACTURES): Vit D, 25-Hydroxy: 14 ng/mL — ABNORMAL LOW (ref 30–100)

## 2014-09-24 MED ORDER — VITAMIN D3 1.25 MG (50000 UT) PO CAPS: 50000.0000 [IU] | ORAL_CAPSULE | ORAL | Status: AC

## 2014-09-24 NOTE — Addendum Note (Signed)
Addended by: [name] on: 09/24/2014 07:34 PM   Modules accepted: Orders

## 2014-10-04 DIAGNOSIS — F331 Major depressive disorder, recurrent, moderate: Secondary | ICD-10-CM | POA: Diagnosis not present

## 2014-10-07 DIAGNOSIS — F331 Major depressive disorder, recurrent, moderate: Secondary | ICD-10-CM | POA: Diagnosis not present

## 2014-10-11 DIAGNOSIS — F331 Major depressive disorder, recurrent, moderate: Secondary | ICD-10-CM | POA: Diagnosis not present

## 2014-10-14 DIAGNOSIS — F331 Major depressive disorder, recurrent, moderate: Secondary | ICD-10-CM | POA: Diagnosis not present

## 2014-10-18 ENCOUNTER — Ambulatory Visit (INDEPENDENT_AMBULATORY_CARE_PROVIDER_SITE_OTHER): Payer: Medicare Other | Admitting: Cardiovascular Disease

## 2014-10-18 VITALS — BP 131/82 | HR 92 | Ht 62.5 in | Wt 178.2 lb

## 2014-10-18 DIAGNOSIS — I10 Essential (primary) hypertension: Secondary | ICD-10-CM | POA: Diagnosis not present

## 2014-10-18 DIAGNOSIS — R9431 Abnormal electrocardiogram [ECG] [EKG]: Secondary | ICD-10-CM

## 2014-10-18 DIAGNOSIS — E785 Hyperlipidemia, unspecified: Secondary | ICD-10-CM | POA: Diagnosis not present

## 2014-10-18 DIAGNOSIS — F329 Major depressive disorder, single episode, unspecified: Secondary | ICD-10-CM | POA: Diagnosis not present

## 2014-10-18 DIAGNOSIS — R0789 Other chest pain: Secondary | ICD-10-CM | POA: Diagnosis not present

## 2014-10-18 DIAGNOSIS — E042 Nontoxic multinodular goiter: Secondary | ICD-10-CM

## 2014-10-18 NOTE — Patient Instructions (Signed)
Dr Sallyanne Kuster has ordered the following test(s) to be done: 1. Exercise Treadmill stress test - For further information please visit [email or web address]. Please also follow instruction sheet, as given.  Your physician has referred you to Dr Benjiman Core at [facility].  Dr Sallyanne Kuster wants you to follow-up in 6 months. You will receive a reminder letter in the mail one months in advance. If you don't receive a letter, please call our office to schedule the follow-up appointment.

## 2014-10-19 DIAGNOSIS — F331 Major depressive disorder, recurrent, moderate: Secondary | ICD-10-CM | POA: Diagnosis not present

## 2014-10-21 ENCOUNTER — Encounter: Payer: Self-pay | Admitting: Cardiovascular Disease

## 2014-10-21 DIAGNOSIS — F331 Major depressive disorder, recurrent, moderate: Secondary | ICD-10-CM | POA: Diagnosis not present

## 2014-10-21 NOTE — Progress Notes (Signed)
Patient ID: Tamara [name], female   DOB: Dec 02, 1960, 54 y.o.   MRN: [id number]     Cardiology Office Note   Date:  10/21/2014   ID:  Tamara [name], DOB 1960/11/15, MRN [id number]  PCP:  [name], MD  Cardiologist:   Sanda Klein, MD   Chief Complaint  Patient presents with  . Annual Exam    patient reports left arm/hand goes numb      History of Present Illness: Tamara [name] is a 54 y.o. female who presents for follow-up for hypertension and mixed hyperlipidemia and an abnormal electrocardiogram. She has not seen a cardiologist since March 2013, when she had atypical chest discomfort and an abnormal electrocardiogram, followed by a normal Lexiscan Myoview.  She reports intermittent left arm and hand numbness that has somewhat positional quality, sounds most consistent with cervical radiculopathy.  In April 2015 she had a possible transient ischemic attack (left side numbness) and was monitored overnight in the hospital. Her MRI was normal. A carotid scan showed less than 40% bilateral lesions. Her echocardiogram showed normal left ventricular ejection fraction at 55-60 percent and a mildly dilated right ventricle.  She reports intolerance to multiple statins (to include simvastatin, pravastatin, rosuvastatin, atorvastatin) which all cause intolerable myalgias in her upper arms. She clearly prefers a "natural" approach to medications and is taking numerous supplements.  She has a history of severe anxiety and depression and has even undergone previous electroconvulsive therapy. She takes numerous psychotropic medications.  Questionable history of tongue swelling with angiotensin receptor blocker.  She has been diagnosed with thyroid nodules and requests a referral to an endocrinologist for management of these.   Past Medical History  Diagnosis Date  . Depression   . HTN (hypertension)   . Hyperlipemia   . Allergy   . GERD (gastroesophageal reflux disease)   .  Neuromuscular disorder   . Anemia   . Anxiety   . Asthma   . Shock therapy as cause of abnormal reaction of patient or of later complication without mention of misadventure at time of procedure   . History of short term memory loss   . Diverticulitis     Past Surgical History  Procedure Laterality Date  . Cholecystectomy    . Abdominal hysterectomy      partial  . Colonoscopy       Current Outpatient Prescriptions  Medication Sig Dispense Refill  . ASHWAGANDHA PO Take 670 mg by mouth daily.    . Biotin 5000 MCG CAPS Take 1 capsule by mouth daily.    . Chlorophyll 100 MG TABS Take 1 tablet by mouth daily.    . Cholecalciferol (VITAMIN D-3) 1000 UNITS CAPS Take 1 capsule by mouth daily.    Nyoka Cowden Tea, Camillia sinensis, (GREEN TEA EXTRACT PO) Take 400 mg by mouth daily.    Marland Kitchen HYDROcodone-acetaminophen (NORCO/VICODIN) 5-325 MG per tablet Take 1 tablet by mouth daily as needed.    Marland Kitchen MAGNESIUM GLYCINATE PLUS PO Take 100 mg by mouth daily.    Marland Kitchen OVER THE COUNTER MEDICATION R-Lipoic Acid. Take 1 tablet by mouth daily.    . Riboflavin 400 MG CAPS Take 1 capsule by mouth daily.    . Taurine 1000 MG CAPS Take 1 capsule by mouth daily.    . vitamin C (ASCORBIC ACID) 500 MG tablet Take 500 mg by mouth daily.    Marland Kitchen albuterol (PROVENTIL HFA;VENTOLIN HFA) 108 (90 BASE) MCG/ACT inhaler Inhale 2 puffs into the lungs every 6 (six) hours  as needed for wheezing or shortness of breath. 1 Inhaler 3  . ALPRAZolam (ALPRAZOLAM XR) 1 MG 24 hr tablet Take 3 tablets (3 mg total) by mouth at bedtime. 90 tablet 5  . ALPRAZolam (XANAX) 1 MG tablet Take 1 tablet (1 mg total) by mouth daily as needed for anxiety. 30 tablet 3  . aspirin 325 MG tablet Take 1 tablet (325 mg total) by mouth daily. 30 tablet 0  . baclofen (LIORESAL) 10 MG tablet Take 1 tablet (10 mg total) by mouth 3 (three) times daily. 30 each 11  . Cholecalciferol (VITAMIN D3) 50000 UNITS CAPS Take 50,000 Units by mouth once a week. 12 capsule 2  .  cyclobenzaprine (FLEXERIL) 10 MG tablet Take 1 tablet (10 mg total) by mouth at bedtime as needed for muscle spasms. 20 tablet 0  . dexlansoprazole (DEXILANT) 60 MG capsule Take 1 capsule (60 mg total) by mouth daily. 90 capsule 3  . diclofenac sodium (VOLTAREN) 1 % GEL Apply 2 g topically daily as needed (for pain). 100 g 3  . doxycycline (VIBRAMYCIN) 100 MG capsule Take 1 capsule (100 mg total) by mouth 2 (two) times daily. 180 capsule 3  . fenofibrate (TRICOR) 145 MG tablet Take 1 tablet (145 mg total) by mouth daily. 90 tablet 1  . LYSINE PO Take 1 tablet by mouth as needed. prn    . Oxcarbazepine (TRILEPTAL) 300 MG tablet Take 1 tablet (300 mg total) by mouth 2 (two) times daily. 60 tablet 5  . perphenazine (TRILAFON) 4 MG tablet Take 4 mg by mouth 2 (two) times daily.    . temazepam (RESTORIL) 15 MG capsule Take 3 capsules (45 mg total) by mouth at bedtime. 90 capsule 5   No current facility-administered medications for this visit.    Allergies:   Adhesive; Gabapentin; Ivp dye; Oxycodone; Propoxyphene hcl; Seroquel; Statins; Penicillins; and Sulfonamide derivatives    Social History:  The patient  reports that she has never smoked. She has never used smokeless tobacco. She reports that she does not drink alcohol or use illicit drugs.   Family History:  The patient's family history includes Arrhythmia in her father; Cancer in her mother; Colon cancer in her paternal uncle; Coronary artery disease (age of onset: 15) in her maternal grandfather; Diabetes in her daughter and paternal grandfather; Hearing loss in her paternal grandfather and paternal grandmother; Heart disease in her father and maternal grandfather; Hyperlipidemia in her brother, father, maternal grandmother, and mother; Hypertension in her father, maternal grandfather, and mother; Stroke in her father.    ROS:  Please see the history of present illness.    Otherwise, review of systems positive for numerous somatic complaints  including back pain, neck pain, shoulder and left arm numbness, mood problems, but denies exertional chest pain or dyspnea, palpitations, syncope or presyncope, lower extremity edema, intermittent claudication or other cardiovascular problems..   All other systems are reviewed and negative.    PHYSICAL EXAM: VS:  BP 131/82 mmHg  Ht 5' 2.5" (1.588 m)  Wt 178 lb 3.2 oz (80.831 kg)  BMI 32.05 kg/m2 , BMI Body mass index is 32.05 kg/(m^2).  General: Alert, oriented x3, no distress Head: no evidence of trauma, PERRL, EOMI, no exophtalmos or lid lag, no myxedema, no xanthelasma; normal ears, nose and oropharynx Neck: normal jugular venous pulsations and no hepatojugular reflux; brisk carotid pulses without delay and no carotid bruits Chest: clear to auscultation, no signs of consolidation by percussion or palpation, normal fremitus, symmetrical  and full respiratory excursions Cardiovascular: normal position and quality of the apical impulse, regular rhythm, normal first and second heart sounds, no murmurs, rubs or gallops Abdomen: no tenderness or distention, no masses by palpation, no abnormal pulsatility or arterial bruits, normal bowel sounds, no hepatosplenomegaly Extremities: no clubbing, cyanosis or edema; 2+ radial, ulnar and brachial pulses bilaterally; 2+ right femoral, posterior tibial and dorsalis pedis pulses; 2+ left femoral, posterior tibial and dorsalis pedis pulses; no subclavian or femoral bruits Neurological: grossly nonfocal Psych: euthymic mood, full affect   EKG:  EKG is ordered today. The ekg ordered today demonstrates normal sinus rhythm with T-wave inversion in leads V1-V3 and a very minor nonspecific intraventricular conduction delay (crochet on the down stroke of the R-wave) and borderline QT interval (QTC 455 ms). It is quite similar to electrocardiogram from February 2013 and May 2013   Recent Labs: 09/02/2014: ALT 25; BUN 21; Creatinine 1.14*; Hemoglobin 14.0; Platelets  318; Potassium 3.9; Sodium 138; TSH 0.668    Lipid Panel    Component Value Date/Time   CHOL 245* 09/21/2014 1919   TRIG 301* 09/21/2014 1919   HDL 50 09/21/2014 1919   CHOLHDL 4.9 09/21/2014 1919   VLDL 60* 09/21/2014 1919   LDLCALC 135* 09/21/2014 1919   LDLDIRECT 94.2 01/12/2010 0000      Wt Readings from Last 3 Encounters:  10/18/14 178 lb 3.2 oz (80.831 kg)  09/21/14 182 lb 8 oz (82.781 kg)  09/02/14 182 lb (82.555 kg)      Other studies Reviewed:  ASSESSMENT AND PLAN:  1.  Hyperlipidemia - her triglycerides have partially responded to treatment with fenofibrate, but unfortunately she still has markedly elevated total cholesterol and LDL cholesterol. Weight loss and diet are critically important, but we will not reach target lipid levels without a potent medication. She has tried virtually all the commercially available statins. We looked into treatment with a PCS K9 inhibitors such as Repatha or Praluent, but she probably does not meet criteria for these since she has not had vascular disease.  2.  Hypertension with good control despite no antihypertensive medications  3.  Thyroid nodules, referred to endocrinology  4.  Atypical chest discomfort and history of abnormal ECG. I strongly doubt that her current complaints of anything to do with coronary disease but it is not unreasonable to perform a simple treadmill stress test. While her ECG is mildly abnormal, the changes have been present for at least 3 years   Current medicines are reviewed at length with the patient today.  The patient does not have concerns regarding medicines.  The following changes have been made:  no change  Labs/ tests ordered today include:  Orders Placed This Encounter  Procedures  . Ambulatory referral to Endocrinology  . Exercise Tolerance Test    Patient Instructions  Dr Sallyanne Kuster has ordered the following test(s) to be done: 1. Exercise Treadmill stress test - For further information  please visit [email or web address]. Please also follow instruction sheet, as given.  Your physician has referred you to Dr Benjiman Core at [facility].  Dr Sallyanne Kuster wants you to follow-up in 6 months. You will receive a reminder letter in the mail one months in advance. If you don't receive a letter, please call our office to schedule the follow-up appointment.    Mikael Spray, MD  10/21/2014 12:28 PM    Sanda Klein, MD, [facility] [phone] office [phone] pager

## 2014-10-21 NOTE — Addendum Note (Signed)
Addended by: Vear Clock on: 10/21/2014 12:59 PM   Modules accepted: Orders

## 2014-10-26 DIAGNOSIS — R51 Headache: Secondary | ICD-10-CM | POA: Diagnosis not present

## 2014-10-26 DIAGNOSIS — M9901 Segmental and somatic dysfunction of cervical region: Secondary | ICD-10-CM | POA: Diagnosis not present

## 2014-10-28 ENCOUNTER — Telehealth (HOSPITAL_COMMUNITY): Payer: Self-pay

## 2014-10-28 ENCOUNTER — Telehealth (HOSPITAL_COMMUNITY): Payer: Self-pay | Admitting: *Deleted

## 2014-10-28 NOTE — Telephone Encounter (Signed)
Encounter complete. 

## 2014-11-02 ENCOUNTER — Inpatient Hospital Stay (HOSPITAL_COMMUNITY): Admission: RE | Admit: 2014-11-02 | Payer: PRIVATE HEALTH INSURANCE | Source: Ambulatory Visit

## 2014-11-03 DIAGNOSIS — F331 Major depressive disorder, recurrent, moderate: Secondary | ICD-10-CM | POA: Diagnosis not present

## 2014-11-04 DIAGNOSIS — F331 Major depressive disorder, recurrent, moderate: Secondary | ICD-10-CM | POA: Diagnosis not present

## 2014-11-05 DIAGNOSIS — M9901 Segmental and somatic dysfunction of cervical region: Secondary | ICD-10-CM | POA: Diagnosis not present

## 2014-11-05 DIAGNOSIS — R51 Headache: Secondary | ICD-10-CM | POA: Diagnosis not present

## 2014-11-08 DIAGNOSIS — F331 Major depressive disorder, recurrent, moderate: Secondary | ICD-10-CM | POA: Diagnosis not present

## 2014-11-09 DIAGNOSIS — F331 Major depressive disorder, recurrent, moderate: Secondary | ICD-10-CM | POA: Diagnosis not present

## 2014-11-10 ENCOUNTER — Telehealth (HOSPITAL_COMMUNITY): Payer: Self-pay | Admitting: *Deleted

## 2014-11-10 DIAGNOSIS — F331 Major depressive disorder, recurrent, moderate: Secondary | ICD-10-CM | POA: Diagnosis not present

## 2014-11-11 ENCOUNTER — Ambulatory Visit: Payer: PRIVATE HEALTH INSURANCE | Admitting: Internal Medicine

## 2014-11-11 DIAGNOSIS — F331 Major depressive disorder, recurrent, moderate: Secondary | ICD-10-CM | POA: Diagnosis not present

## 2014-11-16 DIAGNOSIS — F331 Major depressive disorder, recurrent, moderate: Secondary | ICD-10-CM | POA: Diagnosis not present

## 2014-11-17 DIAGNOSIS — R51 Headache: Secondary | ICD-10-CM | POA: Diagnosis not present

## 2014-11-17 DIAGNOSIS — M9901 Segmental and somatic dysfunction of cervical region: Secondary | ICD-10-CM | POA: Diagnosis not present

## 2014-11-17 DIAGNOSIS — F331 Major depressive disorder, recurrent, moderate: Secondary | ICD-10-CM | POA: Diagnosis not present

## 2014-11-18 DIAGNOSIS — L309 Dermatitis, unspecified: Secondary | ICD-10-CM | POA: Diagnosis not present

## 2014-11-22 DIAGNOSIS — F331 Major depressive disorder, recurrent, moderate: Secondary | ICD-10-CM | POA: Diagnosis not present

## 2014-11-24 DIAGNOSIS — F331 Major depressive disorder, recurrent, moderate: Secondary | ICD-10-CM | POA: Diagnosis not present

## 2014-11-29 DIAGNOSIS — F331 Major depressive disorder, recurrent, moderate: Secondary | ICD-10-CM | POA: Diagnosis not present

## 2014-12-01 ENCOUNTER — Ambulatory Visit: Payer: PRIVATE HEALTH INSURANCE | Admitting: Internal Medicine

## 2014-12-01 DIAGNOSIS — F331 Major depressive disorder, recurrent, moderate: Secondary | ICD-10-CM | POA: Diagnosis not present

## 2014-12-02 DIAGNOSIS — F331 Major depressive disorder, recurrent, moderate: Secondary | ICD-10-CM | POA: Diagnosis not present

## 2014-12-08 DIAGNOSIS — R51 Headache: Secondary | ICD-10-CM | POA: Diagnosis not present

## 2014-12-08 DIAGNOSIS — M9901 Segmental and somatic dysfunction of cervical region: Secondary | ICD-10-CM | POA: Diagnosis not present

## 2014-12-09 DIAGNOSIS — F331 Major depressive disorder, recurrent, moderate: Secondary | ICD-10-CM | POA: Diagnosis not present

## 2014-12-13 DIAGNOSIS — F331 Major depressive disorder, recurrent, moderate: Secondary | ICD-10-CM | POA: Diagnosis not present

## 2014-12-14 DIAGNOSIS — M9901 Segmental and somatic dysfunction of cervical region: Secondary | ICD-10-CM | POA: Diagnosis not present

## 2014-12-14 DIAGNOSIS — R51 Headache: Secondary | ICD-10-CM | POA: Diagnosis not present

## 2014-12-20 DIAGNOSIS — F331 Major depressive disorder, recurrent, moderate: Secondary | ICD-10-CM | POA: Diagnosis not present

## 2014-12-21 DIAGNOSIS — M9901 Segmental and somatic dysfunction of cervical region: Secondary | ICD-10-CM | POA: Diagnosis not present

## 2014-12-21 DIAGNOSIS — R51 Headache: Secondary | ICD-10-CM | POA: Diagnosis not present

## 2014-12-23 DIAGNOSIS — F331 Major depressive disorder, recurrent, moderate: Secondary | ICD-10-CM | POA: Diagnosis not present

## 2014-12-25 DIAGNOSIS — R51 Headache: Secondary | ICD-10-CM | POA: Diagnosis not present

## 2014-12-25 DIAGNOSIS — M9901 Segmental and somatic dysfunction of cervical region: Secondary | ICD-10-CM | POA: Diagnosis not present

## 2014-12-28 DIAGNOSIS — F331 Major depressive disorder, recurrent, moderate: Secondary | ICD-10-CM | POA: Diagnosis not present

## 2015-01-04 DIAGNOSIS — F331 Major depressive disorder, recurrent, moderate: Secondary | ICD-10-CM | POA: Diagnosis not present

## 2015-01-12 DIAGNOSIS — F331 Major depressive disorder, recurrent, moderate: Secondary | ICD-10-CM | POA: Diagnosis not present

## 2015-01-13 DIAGNOSIS — F4310 Post-traumatic stress disorder, unspecified: Secondary | ICD-10-CM | POA: Diagnosis not present

## 2015-01-14 DIAGNOSIS — F329 Major depressive disorder, single episode, unspecified: Secondary | ICD-10-CM | POA: Diagnosis not present

## 2015-01-17 DIAGNOSIS — F331 Major depressive disorder, recurrent, moderate: Secondary | ICD-10-CM | POA: Diagnosis not present

## 2015-01-18 DIAGNOSIS — F4310 Post-traumatic stress disorder, unspecified: Secondary | ICD-10-CM | POA: Diagnosis not present

## 2015-01-19 ENCOUNTER — Telehealth: Payer: Self-pay

## 2015-01-19 ENCOUNTER — Ambulatory Visit (INDEPENDENT_AMBULATORY_CARE_PROVIDER_SITE_OTHER): Payer: Medicare Other | Admitting: Family Medicine

## 2015-01-19 VITALS — BP 106/80 | HR 88 | Temp 98.4°F | Resp 16 | Ht 62.5 in | Wt 177.2 lb

## 2015-01-19 DIAGNOSIS — M545 Low back pain, unspecified: Secondary | ICD-10-CM

## 2015-01-19 DIAGNOSIS — H9203 Otalgia, bilateral: Secondary | ICD-10-CM

## 2015-01-19 DIAGNOSIS — E559 Vitamin D deficiency, unspecified: Secondary | ICD-10-CM | POA: Diagnosis not present

## 2015-01-19 DIAGNOSIS — E785 Hyperlipidemia, unspecified: Secondary | ICD-10-CM | POA: Diagnosis not present

## 2015-01-19 LAB — LIPID PANEL
Cholesterol: 242 mg/dL — ABNORMAL HIGH (ref 0–200)
HDL: 57 mg/dL (ref >=46)
LDL Cholesterol: 161 mg/dL — ABNORMAL HIGH (ref 0–99)
Total CHOL/HDL Ratio: 4.2 Ratio
Triglycerides: 120 mg/dL (ref <150)
VLDL: 24 mg/dL (ref 0–40)

## 2015-01-19 LAB — COMPLETE METABOLIC PANEL WITH GFR
ALT: 15 U/L (ref 0–35)
AST: 19 U/L (ref 0–37)
Albumin: 4.5 g/dL (ref 3.5–5.2)
Alkaline Phosphatase: 63 U/L (ref 39–117)
BUN: 19 mg/dL (ref 6–23)
CO2: 26 mEq/L (ref 19–32)
Calcium: 10.4 mg/dL (ref 8.4–10.5)
Chloride: 104 mEq/L (ref 96–112)
Creat: 1.04 mg/dL (ref 0.50–1.10)
GFR, Est African American: 71 mL/min
GFR, Est Non African American: 62 mL/min
Glucose, Bld: 93 mg/dL (ref 70–99)
Potassium: 4.4 mEq/L (ref 3.5–5.3)
Sodium: 139 mEq/L (ref 135–145)
Total Bilirubin: 0.6 mg/dL (ref 0.2–1.2)
Total Protein: 7.5 g/dL (ref 6.0–8.3)

## 2015-01-19 MED ORDER — FENOFIBRATE 145 MG PO TABS: 145.0000 mg | ORAL_TABLET | Freq: Every day | ORAL | Status: DC

## 2015-01-19 MED ORDER — LEVOFLOXACIN 500 MG PO TABS: 500.0000 mg | ORAL_TABLET | Freq: Every day | ORAL | Status: DC

## 2015-01-19 NOTE — Progress Notes (Signed)
@[facility]FCLOGO @  This chart was scribed for [name], MD by Thea Alken, ED Scribe. This patient was seen in room 8 and the patient's care was started at 10:09 AM.  Patient ID: Tamara [name] MRN: [id number], DOB: 1961/07/01, 54 y.o. Date of Encounter: 01/19/2015, 10:09 AM  Primary Physician: [name], MD  Chief Complaint: Chief Complaint  Patient presents with   Back Pain    lower back pain, on going for months,would like to go to PT   Ear Pain    Both ears, [place] and ever since then pain    HPI: 54 y.o. year old female with history below presents with multiple complaints.  Back Pain Pt has intermittent low back pain for the past couple months. She was seeing chiropractor but is no longer covered by insurance.She has not been exercising regularly. Pt is requesting a referral to PT. She denies leg weakness.  Ear Pain. Pt is c/o of bilateral ear pain. Pt has had otalgia [place] along with a sore throat and a low grade fever and chills.  She has tried a natural remedy of honey, apple cider vinegar and cinnamon with relief to sore throat but otalgia and chills persisted.   Med refill Pt is requesting a refill of fenofibrate. She would also like her vitamin D levels checked.   Past Medical History  Diagnosis Date   Depression    HTN (hypertension)    Hyperlipemia    Allergy    GERD (gastroesophageal reflux disease)    Neuromuscular disorder    Anemia    Anxiety    Asthma    Shock therapy as cause of abnormal reaction of patient or of later complication without mention of misadventure at time of procedure    History of short term memory loss    Diverticulitis      Home Meds: Prior to Admission medications   Medication Sig Start Date End Date Taking? Authorizing Provider  albuterol (PROVENTIL HFA;VENTOLIN HFA) 108 (90 BASE) MCG/ACT inhaler Inhale 2 puffs into the lungs every 6 (six) hours  as needed for wheezing or shortness of breath. 09/02/14   [name], MD  ALPRAZolam (ALPRAZOLAM XR) 1 MG 24 hr tablet Take 3 tablets (3 mg total) by mouth at bedtime. 09/02/14   [name], MD  ALPRAZolam Duanne Moron) 1 MG tablet Take 1 tablet (1 mg total) by mouth daily as needed for anxiety. 09/02/14   [name], MD  ASHWAGANDHA PO Take 670 mg by mouth daily.    Historical Provider, MD  aspirin 325 MG tablet Take 1 tablet (325 mg total) by mouth daily. 10/22/13   Bernadene Bell, MD  baclofen (LIORESAL) 10 MG tablet Take 1 tablet (10 mg total) by mouth 3 (three) times daily. 09/02/14   [name], MD  Biotin 5000 MCG CAPS Take 1 capsule by mouth daily.    Historical Provider, MD  Chlorophyll 100 MG TABS Take 1 tablet by mouth daily.    Historical Provider, MD  Cholecalciferol (VITAMIN D-3) 1000 UNITS CAPS Take 1 capsule by mouth daily.    Historical Provider, MD  Cholecalciferol (VITAMIN D3) 50000 UNITS CAPS Take 50,000 Units by mouth once a week. 09/24/14   [name], MD  cyclobenzaprine (FLEXERIL) 10 MG tablet Take 1 tablet (10 mg total) by mouth at bedtime as needed for muscle spasms. 08/07/14   Gregor Hams, MD  dexlansoprazole (DEXILANT) 60 MG capsule Take 1 capsule (  60 mg total) by mouth daily. 09/02/14   [name], MD  diclofenac sodium (VOLTAREN) 1 % GEL Apply 2 g topically daily as needed (for pain). 09/02/14   [name], MD  doxycycline (VIBRAMYCIN) 100 MG capsule Take 1 capsule (100 mg total) by mouth 2 (two) times daily. 09/02/14   [name], MD  fenofibrate (TRICOR) 145 MG tablet Take 1 tablet (145 mg total) by mouth daily. 09/02/14   [name], MD  Nyoka Cowden Tea, Camillia sinensis, (GREEN TEA EXTRACT PO) Take 400 mg by mouth daily.    Historical Provider, MD  HYDROcodone-acetaminophen (NORCO/VICODIN) 5-325 MG per tablet Take 1 tablet by mouth daily as needed. 06/18/14   Historical Provider, MD  LYSINE PO Take 1 tablet by mouth as needed. prn    Historical  Provider, MD  MAGNESIUM GLYCINATE PLUS PO Take 100 mg by mouth daily.    Historical Provider, MD  OVER THE COUNTER MEDICATION R-Lipoic Acid. Take 1 tablet by mouth daily.    Historical Provider, MD  Oxcarbazepine (TRILEPTAL) 300 MG tablet Take 1 tablet (300 mg total) by mouth 2 (two) times daily. 09/02/14   [name], MD  perphenazine (TRILAFON) 4 MG tablet Take 4 mg by mouth 2 (two) times daily.    Historical Provider, MD  Riboflavin 400 MG CAPS Take 1 capsule by mouth daily.    Historical Provider, MD  Taurine 1000 MG CAPS Take 1 capsule by mouth daily.    Historical Provider, MD  temazepam (RESTORIL) 15 MG capsule Take 3 capsules (45 mg total) by mouth at bedtime. 09/02/14   [name], MD  vitamin C (ASCORBIC ACID) 500 MG tablet Take 500 mg by mouth daily.    Historical Provider, MD    Allergies:  Allergies  Allergen Reactions   Adhesive [Tape]     This is tape as well as adhesive on bandaids.   Gabapentin     Unknown reaction   Ivp Dye [Iodinated Diagnostic Agents] Swelling   Oxycodone    Propoxyphene Hcl Other (See Comments)    hallucination   Seroquel [Quetiapine Fumerate]     Unknown reaction   Statins     Arm pain   Penicillins Rash   Sulfonamide Derivatives Rash    History   Social History   Marital Status: Divorced    Spouse Name: N/A   Number of Children: 3   Years of Education: college   Occupational History        Disabled   Social History Main Topics   Smoking status: Never Smoker    Smokeless tobacco: Never Used   Alcohol Use: No   Drug Use: No   Sexual Activity: Yes    Museum/gallery curator: None   Other Topics Concern   Not on file   Social History Narrative   Lives with boyfriend. Walks daily for 20 minutes. Education: [employer].     Review of Systems: Constitutional: negative for chills, fever, night sweats, weight changes, or fatigue  HEENT: negative for vision changes, hearing loss, congestion, rhinorrhea,  ST, epistaxis, or sinus pressure Cardiovascular: negative for chest pain or palpitations Respiratory: negative for hemoptysis, wheezing, shortness of breath, or cough Abdominal: negative for abdominal pain, nausea, vomiting, diarrhea, or constipation Dermatological: negative for rash Neurologic: negative for headache, dizziness, or syncope All other systems reviewed and are otherwise negative with the exception to those above and in the HPI.   Physical Exam: Blood pressure 106/80, pulse 88, temperature 98.4 F (36.9 C), temperature source  Oral, resp. rate 16, height 5' 2.5" (1.588 m), weight 177 lb 3.2 oz (80.377 kg), SpO2 98 %., Body mass index is 31.87 kg/(m^2). General: Well developed, well nourished, in no acute distress. Head: Normocephalic, atraumatic, eyes without discharge, sclera non-icteric, nares are without discharge. Bilateral auditory canals clear, TM's are without perforation, pearly grey and translucent with reflective cone of light bilaterally. Oral cavity moist, posterior pharynx without exudate, erythema, peritonsillar abscess, or post nasal drip.  Neck: Supple. No thyromegaly. Full ROM. No lymphadenopathy. Lungs: Clear bilaterally to auscultation without wheezes, rales, or rhonchi. Breathing is unlabored. Heart: RRR with S1 S2. No murmurs, rubs, or gallops appreciated. Abdomen: Soft, non-tender, non-distended with normoactive bowel sounds. No hepatomegaly. No rebound/guarding. No obvious abdominal masses. Msk:  Strength and tone normal for age. Extremities/Skin: Warm and dry. No clubbing or cyanosis. No edema. No rashes or suspicious lesions. Neuro: Alert and oriented X 3. Moves all extremities spontaneously. Gait is normal. CNII-XII grossly in tact. Psych:  Responds to questions appropriately with a normal affect.   Normal reflexes Normal straight leg raising No muscle wasting in the extremities Good pedal pulses No edema  ASSESSMENT AND PLAN:  54 y.o. year old  female with  This chart was scribed in my presence and reviewed by me personally.    ICD-9-CM ICD-10-CM   1. Hyperlipidemia 272.4 E78.5 Lipid panel     COMPLETE METABOLIC PANEL WITH GFR  2. Ear pain, bilateral 388.70 H92.03 levofloxacin (LEVAQUIN) 500 MG tablet  3. Low back pain at multiple sites 724.2 M54.5 Ambulatory referral to Physical Therapy  4. Vitamin D deficiency 268.9 E55.9 Vit D  25 hydroxy (rtn osteoporosis monitoring)     Signed, [name], MD    Signed, [name], MD 01/19/2015 10:09 AM

## 2015-01-19 NOTE — Telephone Encounter (Signed)
Rx sent.  Pt notified. 

## 2015-01-19 NOTE — Telephone Encounter (Addendum)
The Levoflaxacin is scheduled to be refilled through a mail order pharmacy but the patient needs it to be refilled and sent to [facility] on [address], [place], [place] because this is an antibiotic and she doesn't want to wait 5 days before she receives this medicine

## 2015-01-20 ENCOUNTER — Encounter: Payer: Self-pay | Admitting: Family Medicine

## 2015-01-20 ENCOUNTER — Ambulatory Visit: Payer: PRIVATE HEALTH INSURANCE | Admitting: Internal Medicine

## 2015-01-20 LAB — VITAMIN D 25 HYDROXY (VIT D DEFICIENCY, FRACTURES): Vit D, 25-Hydroxy: 45 ng/mL (ref 30–100)

## 2015-01-21 ENCOUNTER — Telehealth: Payer: Self-pay

## 2015-01-21 NOTE — Telephone Encounter (Signed)
Definitely stop the Levaquin.  Hold antibiotic treatment x 2 days, then re evaluate.  If ear pain persists, return for recheck in 48 hours.

## 2015-01-21 NOTE — Telephone Encounter (Signed)
Pt was taking levofloxacin (LEVAQUIN) 500 MG tablet [[id number]]. Her pharmacist told her to stopping taking this script. She would like to know should she be taking something else? Please advise at [phone]

## 2015-01-21 NOTE — Telephone Encounter (Signed)
Dr. Joseph Art,  Please review this patient's record and advise if you recommend an alternate treatment for her ear pain, as the Levaquin is causing severe abdominal pain.  Send your reply to the Clinical Message Pool (p [facility] clinical message).

## 2015-01-21 NOTE — Telephone Encounter (Signed)
Spoke with pt, she states her stomach is killing her for the last two days. She states she has eaten with it and tried everything. She took 2 days worth. Please advise.

## 2015-01-24 NOTE — Telephone Encounter (Signed)
Left message for pt to call back  °

## 2015-02-01 DIAGNOSIS — F4310 Post-traumatic stress disorder, unspecified: Secondary | ICD-10-CM | POA: Diagnosis not present

## 2015-02-02 DIAGNOSIS — F331 Major depressive disorder, recurrent, moderate: Secondary | ICD-10-CM | POA: Diagnosis not present

## 2015-02-08 DIAGNOSIS — F4310 Post-traumatic stress disorder, unspecified: Secondary | ICD-10-CM | POA: Diagnosis not present

## 2015-02-10 DIAGNOSIS — R51 Headache: Secondary | ICD-10-CM | POA: Diagnosis not present

## 2015-02-10 DIAGNOSIS — F331 Major depressive disorder, recurrent, moderate: Secondary | ICD-10-CM | POA: Diagnosis not present

## 2015-02-10 DIAGNOSIS — M9901 Segmental and somatic dysfunction of cervical region: Secondary | ICD-10-CM | POA: Diagnosis not present

## 2015-02-16 ENCOUNTER — Encounter: Payer: Self-pay | Admitting: Internal Medicine

## 2015-02-16 ENCOUNTER — Ambulatory Visit (INDEPENDENT_AMBULATORY_CARE_PROVIDER_SITE_OTHER): Payer: Medicare Other | Admitting: Internal Medicine

## 2015-02-16 VITALS — BP 122/88 | HR 89 | Temp 98.6°F | Resp 12 | Ht 62.5 in | Wt 179.0 lb

## 2015-02-16 DIAGNOSIS — E042 Nontoxic multinodular goiter: Secondary | ICD-10-CM | POA: Diagnosis not present

## 2015-02-16 DIAGNOSIS — F331 Major depressive disorder, recurrent, moderate: Secondary | ICD-10-CM | POA: Diagnosis not present

## 2015-02-16 NOTE — Progress Notes (Signed)
Patient ID: Tamara [name], female   DOB: 1961-03-10, 54 y.o.   MRN: [id number]   HPI  Tamara [name] is a 54 y.o.-year-old female, referred by her PCP, Dr. Joseph Art, for evaluation for MNG. She previously saw Dr. Loanne Drilling up to 2012 and then Dr. Buddy Duty, then Dr Hartford Poli.   Thyroid U/S (06/02/2013):   Right thyroid lobe: 63 x 18 x 21 mm. Homogeneous background parenchyma. 9 x 6 x 12 mm cyst in the inferior pole (previously 17 x 18 x 11). Multiple hypoechoic nodules measuring less than 5 mm maximum diameter.  Left thyroid lobe: 58 x 16 x 19 mm. 10 x 7 x 13 mm solid nodule, inferior pole (previously 10 x 6 x 8). Multiple additional hypoechoic/cystic lesions less than 5 mm.  Isthmus: 6.2 mm. No nodules visualized.  Lymphadenopathy: None visualized. IMPRESSION: 1. Multiple small bilateral nodules and cysts. Findings do not meet current consensus criteria for biopsy.  Biopsy of the right lower lobe nodule (cystic) (06/11/2012): benign    Pt denies feeling nodules in neck, hoarseness, dysphagia/odynophagia, SOB with lying down.  I reviewed pt's thyroid tests: Lab Results  Component Value Date   TSH 0.668 09/02/2014   TSH 0.925 09/10/2013   TSH 0.461 09/10/2012   TSH 0.316* 10/04/2011   TSH 0.132* 08/30/2011   TSH 1.60 02/16/2010   TSH 1.46 01/12/2010   TSH 2.27 11/23/2008   TSH 0.65 09/17/2007   TSH 1.45 08/26/2006   FREET4 1.09 10/04/2011    She was on LT4 in the past - started by her psychiatrist (Dr Amalia Greenhouse).  Pt c/o: - heat intolerance/cold intolerance - tremors - palpitations - anxiety/depression - hyperdefecation/constipation - weight loss - weight gain - dry skin - hair falling - problems with concentration - fatigue  Pt does have a FH of thyroid ds: on her mother's side. + FH of thyroid cancer in her family: maternal aunt. No h/o radiation tx to head or neck.  No seaweed or kelp, no recent contrast studies. No steroid use. No herbal supplements.    HL:  Reviewed Lipid levels: Lab Results  Component Value Date   CHOL 242* 01/19/2015   CHOL 245* 09/21/2014   CHOL 256* 09/02/2014   Lab Results  Component Value Date   HDL 57 01/19/2015   HDL 50 09/21/2014   HDL 54 09/02/2014   Lab Results  Component Value Date   LDLCALC 161* 01/19/2015   LDLCALC 135* 09/21/2014   LDLCALC 177* 09/02/2014   Lab Results  Component Value Date   TRIG 120 01/19/2015   TRIG 301* 09/21/2014   TRIG 124 09/02/2014   Lab Results  Component Value Date   CHOLHDL 4.2 01/19/2015   CHOLHDL 4.9 09/21/2014   CHOLHDL 4.7 09/02/2014   Lab Results  Component Value Date   LDLDIRECT 94.2 01/12/2010   LDLDIRECT 80.2 11/23/2008   LDLDIRECT 76.1 09/17/2007   Intolerant to statins. Only on supplements now.  ROS: Constitutional: + weight gain, + fatigue, + hot flushes, + poor sleep, + nocturia Eyes: + blurry vision, no xerophthalmia ENT: no sore throat, no nodules palpated in throat, no dysphagia/odynophagia, no hoarseness, + tinnitus, + hypoacusis Cardiovascular: no CP/SOB/no palpitations/+ leg swelling Respiratory: no cough/SOB/+ wheezing Gastrointestinal: no N/V/D/C, + heartburn Musculoskeletal: + muscle aches/+ joint aches Skin: no rashes, + easy bruising, + hair loss Neurological: no tremors/numbness/tingling/dizziness, + HA Psychiatric: + both: depression/anxiety  Psychiatric: + both: depression/anxiety (PTSD) - had ECT  Past Medical History  Diagnosis Date  . Depression   .  HTN (hypertension)   . Hyperlipemia   . Allergy   . GERD (gastroesophageal reflux disease)   . Neuromuscular disorder   . Anemia   . Anxiety   . Asthma   . Shock therapy as cause of abnormal reaction of patient or of later complication without mention of misadventure at time of procedure   . History of short term memory loss   . Diverticulitis    Past Surgical History  Procedure Laterality Date  . Cholecystectomy    . Abdominal hysterectomy      partial   . Colonoscopy     Social History   Social History  . Marital Status: Divorced    Spouse Name: N/A  . Number of Children: 3  . Years of Education: college   Occupational History  .      Disabled   Social History Main Topics  . Smoking status: Never Smoker   . Smokeless tobacco: Never Used  . Alcohol Use: No  . Drug Use: No  . Sexual Activity: Yes    Birth Control/ Protection: None   Other Topics Concern  . Not on file   Social History Narrative   Lives with boyfriend. Walks daily for 20 minutes. Education: [employer].   Current Outpatient Prescriptions on File Prior to Visit  Medication Sig Dispense Refill  . albuterol (PROVENTIL HFA;VENTOLIN HFA) 108 (90 BASE) MCG/ACT inhaler Inhale 2 puffs into the lungs every 6 (six) hours as needed for wheezing or shortness of breath. 1 Inhaler 3  . ALPRAZolam (ALPRAZOLAM XR) 1 MG 24 hr tablet Take 3 tablets (3 mg total) by mouth at bedtime. 90 tablet 5  . ALPRAZolam (XANAX) 1 MG tablet Take 1 tablet (1 mg total) by mouth daily as needed for anxiety. 30 tablet 3  . aspirin 325 MG tablet Take 1 tablet (325 mg total) by mouth daily. 30 tablet 0  . Biotin 5000 MCG CAPS Take 1 capsule by mouth daily.    . Chlorophyll 100 MG TABS Take 1 tablet by mouth daily.    . Cholecalciferol (VITAMIN D-3) 1000 UNITS CAPS Take 1 capsule by mouth daily.    . Cholecalciferol (VITAMIN D3) 50000 UNITS CAPS Take 50,000 Units by mouth once a week. 12 capsule 2  . dexlansoprazole (DEXILANT) 60 MG capsule Take 1 capsule (60 mg total) by mouth daily. 90 capsule 3  . doxycycline (VIBRAMYCIN) 100 MG capsule Take 1 capsule (100 mg total) by mouth 2 (two) times daily. 180 capsule 3  . fenofibrate (TRICOR) 145 MG tablet Take 1 tablet (145 mg total) by mouth daily. 90 tablet 1  . Green Tea, Camillia sinensis, (GREEN TEA EXTRACT PO) Take 400 mg by mouth daily.    Marland Kitchen LYSINE PO Take 1 tablet by mouth as needed. prn    . MAGNESIUM GLYCINATE PLUS PO Take 100 mg by mouth daily.     Marland Kitchen OVER THE COUNTER MEDICATION R-Lipoic Acid. Take 1 tablet by mouth daily.    . Oxcarbazepine (TRILEPTAL) 300 MG tablet Take 1 tablet (300 mg total) by mouth 2 (two) times daily. 60 tablet 5  . Riboflavin 400 MG CAPS Take 1 capsule by mouth daily.    . Taurine 1000 MG CAPS Take 1 capsule by mouth daily.    . temazepam (RESTORIL) 15 MG capsule Take 3 capsules (45 mg total) by mouth at bedtime. 90 capsule 5  . vitamin C (ASCORBIC ACID) 500 MG tablet Take 500 mg by mouth daily.    Aris Lot  PO Take 670 mg by mouth daily.    . baclofen (LIORESAL) 10 MG tablet Take 1 tablet (10 mg total) by mouth 3 (three) times daily. (Patient not taking: Reported on 02/16/2015) 30 each 11  . cyclobenzaprine (FLEXERIL) 10 MG tablet Take 1 tablet (10 mg total) by mouth at bedtime as needed for muscle spasms. (Patient not taking: Reported on 02/16/2015) 20 tablet 0  . diclofenac sodium (VOLTAREN) 1 % GEL Apply 2 g topically daily as needed (for pain). (Patient not taking: Reported on 02/16/2015) 100 g 3  . HYDROcodone-acetaminophen (NORCO/VICODIN) 5-325 MG per tablet Take 1 tablet by mouth daily as needed.    Marland Kitchen perphenazine (TRILAFON) 4 MG tablet Take 4 mg by mouth 2 (two) times daily.     No current facility-administered medications on file prior to visit.   Allergies  Allergen Reactions  . Adhesive [Tape]     This is tape as well as adhesive on bandaids.  . Gabapentin     Unknown reaction  . Ivp Dye [Iodinated Diagnostic Agents] Swelling  . Oxycodone   . Propoxyphene Hcl Other (See Comments)    hallucination  . Seroquel [Quetiapine Fumerate]     Unknown reaction  . Statins     Arm pain  . Penicillins Rash  . Sulfonamide Derivatives Rash   Family History  Problem Relation Age of Onset  . Arrhythmia Father   . Stroke Father   . Hyperlipidemia Father   . Heart disease Father   . Hypertension Father   . Coronary artery disease Maternal Grandfather 40    Died 58  . Heart disease Maternal  Grandfather   . Hypertension Maternal Grandfather   . Cancer Mother   . Hyperlipidemia Mother   . Hypertension Mother   . Hearing loss Paternal Grandmother   . Hearing loss Paternal Grandfather   . Diabetes Paternal Grandfather   . Diabetes Daughter   . Colon cancer Paternal Uncle   . Hyperlipidemia Brother   . Hyperlipidemia Maternal Grandmother    PE: BP 122/88 mmHg  Pulse 89  Temp(Src) 98.6 F (37 C) (Oral)  Resp 12  Ht 5' 2.5" (1.588 m)  Wt 179 lb (81.194 kg)  BMI 32.20 kg/m2  SpO2 95% Wt Readings from Last 3 Encounters:  02/16/15 179 lb (81.194 kg)  01/19/15 177 lb 3.2 oz (80.377 kg)  10/18/14 178 lb 3.2 oz (80.831 kg)   Constitutional: overweight, in NAD Eyes: PERRLA, EOMI, no exophthalmos ENT: moist mucous membranes, no thyromegaly, no cervical lymphadenopathy Cardiovascular: RRR, No MRG Respiratory: CTA B Gastrointestinal: abdomen soft, NT, ND, BS+ Musculoskeletal: no deformities, strength intact in all 4;  Skin: moist, warm, no rashes Neurological: no tremor with outstretched hands, DTR normal in all 4  ASSESSMENT: 1. MNG  2. HL - likely FH  PLAN: 1. MNG  - I reviewed the images of her thyroid ultrasound along with the patient. I pointed out that the dominant nodules are not large. They are without calcifications, without internal blood flow, more wide than tall, and well delimited from surrounding tissues.  Pt does have a thyroid cancer family history but no personal history of RxTx to head/neck. The R thyroid cyst was aspirated >> benign, but the L solid thyroid nodule was not Bx'ed as the size was <limit for bx. She is nervous about her FH of ThyCA and was unhappy with the previous endocrinologist who suggested that she had another U/S in 3 years. - we will check a new Thyroid U/S  now (I reviewed the report of the one obtained by Dr Hartford Poli in 07/2014, but this is too inconsistent with the 2 previous ones >> Will repeat in [place]). We may need a Bx of the  L thyroid nodule when the results are back. - I explained that this is not cancerous, we can continue to follow her on a yearly basis, and check another ultrasound in another year.  - I'll see her back in a year, assuming her U/S is stable and FNA is normal. If FNA abnormal, we will meet sooner.  - I advised pt to join my chart and I will send her the results through there   2. HL - reviewed previous Lipid levels - likely FH - cannot tolerate statins - decrease LDL from ~177 to 135 with diet and supplements >> then relaxed diet >> LDL increased to 160s again - Dr Sallyanne Kuster suggested to retry a statin >> refuses, [facility] did not approve injectable mab  - I suggested Ezetimibe (we can also try Red Yeast Rice - natural statin), but she would like to try diet + supplements again  - time spent with the patient: 1 hour, of which >50% was spent in obtaining information about her 2 conditions - see above reviewing her previous labs, evaluations, and treatments, counseling her about her conditions (please see the discussed topics above), and developing a plan to further investigate it; she had a number of questions which I addressed.  Orders Placed This Encounter  Procedures  . US Soft Tissue Head/Neck   CLINICAL DATA: Follow-up  EXAM: THYROID ULTRASOUND  TECHNIQUE: Ultrasound examination of the thyroid gland and adjacent soft tissues was performed.  COMPARISON: 06/02/2013  FINDINGS: Right thyroid lobe  Measurements: 6.1 x 1.9 x 2.1 cm. 6 mm cyst in the lower pole is not significantly changed. Previously seen cyst measuring 1.2 cm has resolved and is no longer visualized.  Left thyroid lobe  Measurements: 5.8 x 1.6 x 1.6 cm. Solid lower pole nodule measuring 13 x 7 x 10 mm is stable.  Isthmus  Thickness: 5 mm. No nodules visualized.  Lymphadenopathy  None visualized.  IMPRESSION: Stable 13 mm solid left lower pole nodule. The dominant cyst in the right lobe has resolved.  No new solid nodules.   Electronically Signed By: Marybelle Killings M.D. On: 02/17/2015 15:24  Stable L thyroid nodule. Due to pt's anxiety Re: FH of ThyCA, I would suggest Bx. R thyrois cyst has resolved after aspiration.

## 2015-02-16 NOTE — Patient Instructions (Addendum)
We will schedule a thyroid Ultrasound for you.  Please return in 1 year with your sugar log.

## 2015-02-17 ENCOUNTER — Ambulatory Visit
Admission: RE | Admit: 2015-02-17 | Discharge: 2015-02-17 | Disposition: A | Discharge status: Home / Self Care | Payer: Medicare Other | Source: Ambulatory Visit | Attending: Internal Medicine | Admitting: Internal Medicine

## 2015-02-17 DIAGNOSIS — E041 Nontoxic single thyroid nodule: Secondary | ICD-10-CM | POA: Diagnosis not present

## 2015-02-17 DIAGNOSIS — F4310 Post-traumatic stress disorder, unspecified: Secondary | ICD-10-CM | POA: Diagnosis not present

## 2015-02-22 DIAGNOSIS — F331 Major depressive disorder, recurrent, moderate: Secondary | ICD-10-CM | POA: Diagnosis not present

## 2015-02-23 ENCOUNTER — Ambulatory Visit (INDEPENDENT_AMBULATORY_CARE_PROVIDER_SITE_OTHER): Payer: Medicare Other

## 2015-02-23 ENCOUNTER — Ambulatory Visit (INDEPENDENT_AMBULATORY_CARE_PROVIDER_SITE_OTHER): Payer: Medicare Other | Admitting: Family Medicine

## 2015-02-23 VITALS — BP 108/72 | HR 86 | Temp 98.4°F | Resp 16 | Ht 62.5 in | Wt 180.4 lb

## 2015-02-23 DIAGNOSIS — Z23 Encounter for immunization: Secondary | ICD-10-CM

## 2015-02-23 DIAGNOSIS — M545 Low back pain, unspecified: Secondary | ICD-10-CM

## 2015-02-23 DIAGNOSIS — Z7189 Other specified counseling: Secondary | ICD-10-CM

## 2015-02-23 DIAGNOSIS — M546 Pain in thoracic spine: Secondary | ICD-10-CM | POA: Diagnosis not present

## 2015-02-23 DIAGNOSIS — M542 Cervicalgia: Secondary | ICD-10-CM

## 2015-02-23 DIAGNOSIS — Z7185 Encounter for immunization safety counseling: Secondary | ICD-10-CM

## 2015-02-23 DIAGNOSIS — F4310 Post-traumatic stress disorder, unspecified: Secondary | ICD-10-CM | POA: Diagnosis not present

## 2015-02-23 NOTE — Progress Notes (Signed)
   Subjective:    Patient ID: Tamara [name], female    DOB: Jun 30, 1961, 54 y.o.   MRN: [id number] This chart was scribed for [name], MD by Marti Sleigh, Medical Scribe. This patient was seen in Room 13 and the patient's care was started a 2:47 PM.  Chief Complaint  Patient presents with  . Back Injury    Requesting an x-ray for ortho    HPI HPI Comments: Tamara [name] is a 54 y.o. female who presents to [facility] for an x-rays due to neck, mid back and low back pain. She is getting off her sleep medication. She is still using her xanax, but would like to eventually come off it.   Pt would also like a flu shot.  The pt's husband has MS and her mother has cancer, and she is taking care of both of them. She is disabled and does not work.   Review of Systems  Constitutional: Negative for fever and chills.  Genitourinary: Negative for urgency, frequency and flank pain.  Musculoskeletal: Positive for back pain and neck pain.  Psychiatric/Behavioral: Positive for sleep disturbance.       Objective:   Physical Exam  Constitutional: She is oriented to person, place, and time. She appears well-developed and well-nourished. No distress.  HENT:  Head: Normocephalic and atraumatic.  Eyes: Pupils are equal, round, and reactive to light.  Neck: Neck supple.  Cardiovascular: Normal rate.   Pulmonary/Chest: Effort normal. No respiratory distress.  Musculoskeletal: Normal range of motion.  Neurological: She is alert and oriented to person, place, and time. Coordination normal.  Skin: Skin is warm and dry. She is not diaphoretic.  Psychiatric: She has a normal mood and affect. Her behavior is normal.  Nursing note and vitals reviewed.   Filed Vitals:   02/23/15 1423  BP: 108/72  Pulse: 86  Temp: 98.4 F (36.9 C)  TempSrc: Oral  Resp: 16  Height: 5' 2.5" (1.588 m)  Weight: 180 lb 6.4 oz (81.829 kg)  SpO2: 99%   [facility] reading (PRIMARY) by  Dr. Joseph Art: Minimal changes  of aging in C-spine, T-spine and L-spine. No acute abnormalities..      Assessment & Plan:      This chart was scribed in my presence and reviewed by me personally.    ICD-9-CM ICD-10-CM   1. Neck pain 723.1 M54.2 DG Cervical Spine 2 or 3 views  2. Midline thoracic back pain 724.1 M54.6 DG Thoracic Spine 2 View  3. Midline low back pain 724.2 M54.5 DG Lumbar Spine 2-3 Views  4. Immunization counseling V65.49 Z71.89 Flu Vaccine QUAD 36+ mos IM  5. Need for prophylactic vaccination and inoculation against influenza V04.81 Z23 Flu Vaccine QUAD 36+ mos IM     Signed, [name], MD

## 2015-02-23 NOTE — Patient Instructions (Signed)

## 2015-02-28 DIAGNOSIS — F322 Major depressive disorder, single episode, severe without psychotic features: Secondary | ICD-10-CM | POA: Diagnosis not present

## 2015-03-01 DIAGNOSIS — F331 Major depressive disorder, recurrent, moderate: Secondary | ICD-10-CM | POA: Diagnosis not present

## 2015-03-02 DIAGNOSIS — F4310 Post-traumatic stress disorder, unspecified: Secondary | ICD-10-CM | POA: Diagnosis not present

## 2015-03-03 DIAGNOSIS — M531 Cervicobrachial syndrome: Secondary | ICD-10-CM | POA: Diagnosis not present

## 2015-03-03 DIAGNOSIS — M9902 Segmental and somatic dysfunction of thoracic region: Secondary | ICD-10-CM | POA: Diagnosis not present

## 2015-03-03 DIAGNOSIS — M9903 Segmental and somatic dysfunction of lumbar region: Secondary | ICD-10-CM | POA: Diagnosis not present

## 2015-03-03 DIAGNOSIS — M9901 Segmental and somatic dysfunction of cervical region: Secondary | ICD-10-CM | POA: Diagnosis not present

## 2015-03-03 DIAGNOSIS — M5414 Radiculopathy, thoracic region: Secondary | ICD-10-CM | POA: Diagnosis not present

## 2015-03-03 DIAGNOSIS — M5408 Panniculitis affecting regions of neck and back, sacral and sacrococcygeal region: Secondary | ICD-10-CM | POA: Diagnosis not present

## 2015-03-08 DIAGNOSIS — M9903 Segmental and somatic dysfunction of lumbar region: Secondary | ICD-10-CM | POA: Diagnosis not present

## 2015-03-08 DIAGNOSIS — M531 Cervicobrachial syndrome: Secondary | ICD-10-CM | POA: Diagnosis not present

## 2015-03-08 DIAGNOSIS — M5414 Radiculopathy, thoracic region: Secondary | ICD-10-CM | POA: Diagnosis not present

## 2015-03-08 DIAGNOSIS — M9902 Segmental and somatic dysfunction of thoracic region: Secondary | ICD-10-CM | POA: Diagnosis not present

## 2015-03-08 DIAGNOSIS — M5408 Panniculitis affecting regions of neck and back, sacral and sacrococcygeal region: Secondary | ICD-10-CM | POA: Diagnosis not present

## 2015-03-08 DIAGNOSIS — M9901 Segmental and somatic dysfunction of cervical region: Secondary | ICD-10-CM | POA: Diagnosis not present

## 2015-03-08 DIAGNOSIS — F4310 Post-traumatic stress disorder, unspecified: Secondary | ICD-10-CM | POA: Diagnosis not present

## 2015-03-11 DIAGNOSIS — M9903 Segmental and somatic dysfunction of lumbar region: Secondary | ICD-10-CM | POA: Diagnosis not present

## 2015-03-11 DIAGNOSIS — M531 Cervicobrachial syndrome: Secondary | ICD-10-CM | POA: Diagnosis not present

## 2015-03-11 DIAGNOSIS — M9902 Segmental and somatic dysfunction of thoracic region: Secondary | ICD-10-CM | POA: Diagnosis not present

## 2015-03-11 DIAGNOSIS — M5408 Panniculitis affecting regions of neck and back, sacral and sacrococcygeal region: Secondary | ICD-10-CM | POA: Diagnosis not present

## 2015-03-11 DIAGNOSIS — M9901 Segmental and somatic dysfunction of cervical region: Secondary | ICD-10-CM | POA: Diagnosis not present

## 2015-03-11 DIAGNOSIS — F322 Major depressive disorder, single episode, severe without psychotic features: Secondary | ICD-10-CM | POA: Diagnosis not present

## 2015-03-11 DIAGNOSIS — M5414 Radiculopathy, thoracic region: Secondary | ICD-10-CM | POA: Diagnosis not present

## 2015-03-15 DIAGNOSIS — M5408 Panniculitis affecting regions of neck and back, sacral and sacrococcygeal region: Secondary | ICD-10-CM | POA: Diagnosis not present

## 2015-03-15 DIAGNOSIS — M5414 Radiculopathy, thoracic region: Secondary | ICD-10-CM | POA: Diagnosis not present

## 2015-03-15 DIAGNOSIS — M9903 Segmental and somatic dysfunction of lumbar region: Secondary | ICD-10-CM | POA: Diagnosis not present

## 2015-03-15 DIAGNOSIS — M531 Cervicobrachial syndrome: Secondary | ICD-10-CM | POA: Diagnosis not present

## 2015-03-15 DIAGNOSIS — F4310 Post-traumatic stress disorder, unspecified: Secondary | ICD-10-CM | POA: Diagnosis not present

## 2015-03-15 DIAGNOSIS — M9901 Segmental and somatic dysfunction of cervical region: Secondary | ICD-10-CM | POA: Diagnosis not present

## 2015-03-15 DIAGNOSIS — M9902 Segmental and somatic dysfunction of thoracic region: Secondary | ICD-10-CM | POA: Diagnosis not present

## 2015-03-17 DIAGNOSIS — M9903 Segmental and somatic dysfunction of lumbar region: Secondary | ICD-10-CM | POA: Diagnosis not present

## 2015-03-17 DIAGNOSIS — M9902 Segmental and somatic dysfunction of thoracic region: Secondary | ICD-10-CM | POA: Diagnosis not present

## 2015-03-17 DIAGNOSIS — M9901 Segmental and somatic dysfunction of cervical region: Secondary | ICD-10-CM | POA: Diagnosis not present

## 2015-03-17 DIAGNOSIS — M5408 Panniculitis affecting regions of neck and back, sacral and sacrococcygeal region: Secondary | ICD-10-CM | POA: Diagnosis not present

## 2015-03-17 DIAGNOSIS — M5414 Radiculopathy, thoracic region: Secondary | ICD-10-CM | POA: Diagnosis not present

## 2015-03-17 DIAGNOSIS — M531 Cervicobrachial syndrome: Secondary | ICD-10-CM | POA: Diagnosis not present

## 2015-03-18 DIAGNOSIS — M9901 Segmental and somatic dysfunction of cervical region: Secondary | ICD-10-CM | POA: Diagnosis not present

## 2015-03-18 DIAGNOSIS — M5414 Radiculopathy, thoracic region: Secondary | ICD-10-CM | POA: Diagnosis not present

## 2015-03-18 DIAGNOSIS — M9903 Segmental and somatic dysfunction of lumbar region: Secondary | ICD-10-CM | POA: Diagnosis not present

## 2015-03-18 DIAGNOSIS — M9902 Segmental and somatic dysfunction of thoracic region: Secondary | ICD-10-CM | POA: Diagnosis not present

## 2015-03-18 DIAGNOSIS — M5408 Panniculitis affecting regions of neck and back, sacral and sacrococcygeal region: Secondary | ICD-10-CM | POA: Diagnosis not present

## 2015-03-18 DIAGNOSIS — M531 Cervicobrachial syndrome: Secondary | ICD-10-CM | POA: Diagnosis not present

## 2015-03-22 DIAGNOSIS — M531 Cervicobrachial syndrome: Secondary | ICD-10-CM | POA: Diagnosis not present

## 2015-03-22 DIAGNOSIS — M5414 Radiculopathy, thoracic region: Secondary | ICD-10-CM | POA: Diagnosis not present

## 2015-03-22 DIAGNOSIS — M9901 Segmental and somatic dysfunction of cervical region: Secondary | ICD-10-CM | POA: Diagnosis not present

## 2015-03-22 DIAGNOSIS — M9903 Segmental and somatic dysfunction of lumbar region: Secondary | ICD-10-CM | POA: Diagnosis not present

## 2015-03-22 DIAGNOSIS — M5408 Panniculitis affecting regions of neck and back, sacral and sacrococcygeal region: Secondary | ICD-10-CM | POA: Diagnosis not present

## 2015-03-22 DIAGNOSIS — M9902 Segmental and somatic dysfunction of thoracic region: Secondary | ICD-10-CM | POA: Diagnosis not present

## 2015-03-23 DIAGNOSIS — M531 Cervicobrachial syndrome: Secondary | ICD-10-CM | POA: Diagnosis not present

## 2015-03-23 DIAGNOSIS — M5414 Radiculopathy, thoracic region: Secondary | ICD-10-CM | POA: Diagnosis not present

## 2015-03-23 DIAGNOSIS — M9902 Segmental and somatic dysfunction of thoracic region: Secondary | ICD-10-CM | POA: Diagnosis not present

## 2015-03-23 DIAGNOSIS — M9901 Segmental and somatic dysfunction of cervical region: Secondary | ICD-10-CM | POA: Diagnosis not present

## 2015-03-23 DIAGNOSIS — M5408 Panniculitis affecting regions of neck and back, sacral and sacrococcygeal region: Secondary | ICD-10-CM | POA: Diagnosis not present

## 2015-03-23 DIAGNOSIS — M9903 Segmental and somatic dysfunction of lumbar region: Secondary | ICD-10-CM | POA: Diagnosis not present

## 2015-03-25 DIAGNOSIS — M5408 Panniculitis affecting regions of neck and back, sacral and sacrococcygeal region: Secondary | ICD-10-CM | POA: Diagnosis not present

## 2015-03-25 DIAGNOSIS — M531 Cervicobrachial syndrome: Secondary | ICD-10-CM | POA: Diagnosis not present

## 2015-03-25 DIAGNOSIS — M5414 Radiculopathy, thoracic region: Secondary | ICD-10-CM | POA: Diagnosis not present

## 2015-03-25 DIAGNOSIS — M9902 Segmental and somatic dysfunction of thoracic region: Secondary | ICD-10-CM | POA: Diagnosis not present

## 2015-03-25 DIAGNOSIS — M9901 Segmental and somatic dysfunction of cervical region: Secondary | ICD-10-CM | POA: Diagnosis not present

## 2015-03-25 DIAGNOSIS — M9903 Segmental and somatic dysfunction of lumbar region: Secondary | ICD-10-CM | POA: Diagnosis not present

## 2015-03-28 DIAGNOSIS — M9902 Segmental and somatic dysfunction of thoracic region: Secondary | ICD-10-CM | POA: Diagnosis not present

## 2015-03-28 DIAGNOSIS — M531 Cervicobrachial syndrome: Secondary | ICD-10-CM | POA: Diagnosis not present

## 2015-03-28 DIAGNOSIS — M9903 Segmental and somatic dysfunction of lumbar region: Secondary | ICD-10-CM | POA: Diagnosis not present

## 2015-03-28 DIAGNOSIS — M9901 Segmental and somatic dysfunction of cervical region: Secondary | ICD-10-CM | POA: Diagnosis not present

## 2015-03-28 DIAGNOSIS — M5414 Radiculopathy, thoracic region: Secondary | ICD-10-CM | POA: Diagnosis not present

## 2015-03-28 DIAGNOSIS — F329 Major depressive disorder, single episode, unspecified: Secondary | ICD-10-CM | POA: Diagnosis not present

## 2015-03-28 DIAGNOSIS — M5408 Panniculitis affecting regions of neck and back, sacral and sacrococcygeal region: Secondary | ICD-10-CM | POA: Diagnosis not present

## 2015-03-29 ENCOUNTER — Telehealth: Payer: Self-pay | Admitting: Internal Medicine

## 2015-03-29 ENCOUNTER — Inpatient Hospital Stay: Admission: RE | Admit: 2015-03-29 | Payer: Medicare Other | Source: Ambulatory Visit

## 2015-03-29 NOTE — Telephone Encounter (Signed)
Tamara [name], will you call pt and advise her per Dr Arman Filter message?

## 2015-03-29 NOTE — Telephone Encounter (Signed)
Please read message below and advise.  

## 2015-03-29 NOTE — Telephone Encounter (Signed)
The interventional radiologist (who is an MD) is doing the Bx.

## 2015-03-29 NOTE — Telephone Encounter (Signed)
The pt stated that she was lied to by the Dr stating that she would be the one to do the biopsy.The pt does not want to reschedule this appt at this ttime

## 2015-03-29 NOTE — Telephone Encounter (Signed)
Pt states that she is not going to allow [facility] to do the biopsy she states Dr. Cruzita Lederer said she would be at [place] to do the biopsy for her. She will only allow an MD do the biopsy.

## 2015-03-30 DIAGNOSIS — F4310 Post-traumatic stress disorder, unspecified: Secondary | ICD-10-CM | POA: Diagnosis not present

## 2015-04-01 NOTE — Telephone Encounter (Signed)
Pt misunderstood me. I do not perform biopsies.

## 2015-04-05 ENCOUNTER — Telehealth: Payer: Self-pay

## 2015-04-05 DIAGNOSIS — F4310 Post-traumatic stress disorder, unspecified: Secondary | ICD-10-CM | POA: Diagnosis not present

## 2015-04-05 NOTE — Telephone Encounter (Signed)
Pt would like a refill on her doxycycline (VIBRAMYCIN) 100 MG capsule [[id number]]; however, she needs the tablet form. Please advise at  [phone]

## 2015-04-06 ENCOUNTER — Other Ambulatory Visit: Payer: Self-pay | Admitting: Family Medicine

## 2015-04-06 MED ORDER — DOXYCYCLINE HYCLATE 100 MG PO TABS: 100.0000 mg | ORAL_TABLET | Freq: Two times a day (BID) | ORAL | Status: DC

## 2015-04-07 NOTE — Telephone Encounter (Signed)
Patient would like to speak to someone because the medication was called in wrong to the pharmacy. Please call!

## 2015-04-07 NOTE — Telephone Encounter (Signed)
Patient requested tablets of the doxycycline which I believe I ordered.  Please check with the pharmacy to make sure that the correct doxycycline was filled and contact patient accordingly

## 2015-04-08 ENCOUNTER — Telehealth: Payer: Self-pay

## 2015-04-08 ENCOUNTER — Other Ambulatory Visit: Payer: Self-pay | Admitting: Family Medicine

## 2015-04-08 DIAGNOSIS — M9901 Segmental and somatic dysfunction of cervical region: Secondary | ICD-10-CM | POA: Diagnosis not present

## 2015-04-08 DIAGNOSIS — M9903 Segmental and somatic dysfunction of lumbar region: Secondary | ICD-10-CM | POA: Diagnosis not present

## 2015-04-08 DIAGNOSIS — M5408 Panniculitis affecting regions of neck and back, sacral and sacrococcygeal region: Secondary | ICD-10-CM | POA: Diagnosis not present

## 2015-04-08 DIAGNOSIS — F4310 Post-traumatic stress disorder, unspecified: Secondary | ICD-10-CM | POA: Diagnosis not present

## 2015-04-08 DIAGNOSIS — M9902 Segmental and somatic dysfunction of thoracic region: Secondary | ICD-10-CM | POA: Diagnosis not present

## 2015-04-08 DIAGNOSIS — M5414 Radiculopathy, thoracic region: Secondary | ICD-10-CM | POA: Diagnosis not present

## 2015-04-08 DIAGNOSIS — M531 Cervicobrachial syndrome: Secondary | ICD-10-CM | POA: Diagnosis not present

## 2015-04-08 MED ORDER — DOXYCYCLINE HYCLATE 100 MG PO TABS: 100.0000 mg | ORAL_TABLET | Freq: Every day | ORAL | Status: DC

## 2015-04-08 NOTE — Telephone Encounter (Signed)
Pt returning dr Marin Comment call

## 2015-04-08 NOTE — Telephone Encounter (Signed)
I don't see a call from Dr. Marin Comment. Dr. Pauletta Browns name is on the Rx 04/06/2015 for #20 tabs, no refills, capsule.   There are only 2 prescriptions for this drug in the history, the other one is from 10/08/2011, for #180, RF x 3. This was authorized in a phone message.  Of note, this drug is also not on her medication list as of her visit on 01/19/2015, and cystic acne is not on her problem list.  Please advise the patient that Dr. Joseph Art will need to review this and authorize the prescription.

## 2015-04-08 NOTE — Telephone Encounter (Signed)
Called and lmom for pt to call back to find out which pharmacy the doxycycline will need to be sent to .

## 2015-04-08 NOTE — Telephone Encounter (Signed)
Spoke with pt, she wants to change the quantity of the Doxycycline tablets #90. She states Dr. Joseph Art usually gives her a year's worth of this medication due to her cystic acne. Can we send this in for pt.

## 2015-04-13 ENCOUNTER — Telehealth: Payer: Self-pay

## 2015-04-13 NOTE — Telephone Encounter (Signed)
Pt has issues about the quanity of her refill thru mail order -she states this is the fourth call regarding this and also the 2nd time this has happened  [phone]

## 2015-04-14 DIAGNOSIS — F4310 Post-traumatic stress disorder, unspecified: Secondary | ICD-10-CM | POA: Diagnosis not present

## 2015-04-14 MED ORDER — DOXYCYCLINE HYCLATE 100 MG PO TABS: 100.0000 mg | ORAL_TABLET | Freq: Every day | ORAL | Status: DC

## 2015-04-14 NOTE — Telephone Encounter (Signed)
I called patient. She states the Doxycycline was sent in for one daily, #90, but patient states she uses this for her Acne and uses 2/day states she will need #180, please advise.

## 2015-04-19 DIAGNOSIS — F4310 Post-traumatic stress disorder, unspecified: Secondary | ICD-10-CM | POA: Diagnosis not present

## 2015-04-19 DIAGNOSIS — M5408 Panniculitis affecting regions of neck and back, sacral and sacrococcygeal region: Secondary | ICD-10-CM | POA: Diagnosis not present

## 2015-04-19 DIAGNOSIS — M531 Cervicobrachial syndrome: Secondary | ICD-10-CM | POA: Diagnosis not present

## 2015-04-19 DIAGNOSIS — M5414 Radiculopathy, thoracic region: Secondary | ICD-10-CM | POA: Diagnosis not present

## 2015-04-19 DIAGNOSIS — M9903 Segmental and somatic dysfunction of lumbar region: Secondary | ICD-10-CM | POA: Diagnosis not present

## 2015-04-19 DIAGNOSIS — M9902 Segmental and somatic dysfunction of thoracic region: Secondary | ICD-10-CM | POA: Diagnosis not present

## 2015-04-19 DIAGNOSIS — M9901 Segmental and somatic dysfunction of cervical region: Secondary | ICD-10-CM | POA: Diagnosis not present

## 2015-04-22 DIAGNOSIS — M9902 Segmental and somatic dysfunction of thoracic region: Secondary | ICD-10-CM | POA: Diagnosis not present

## 2015-04-22 DIAGNOSIS — M9903 Segmental and somatic dysfunction of lumbar region: Secondary | ICD-10-CM | POA: Diagnosis not present

## 2015-04-22 DIAGNOSIS — M5408 Panniculitis affecting regions of neck and back, sacral and sacrococcygeal region: Secondary | ICD-10-CM | POA: Diagnosis not present

## 2015-04-22 DIAGNOSIS — M531 Cervicobrachial syndrome: Secondary | ICD-10-CM | POA: Diagnosis not present

## 2015-04-22 DIAGNOSIS — M9901 Segmental and somatic dysfunction of cervical region: Secondary | ICD-10-CM | POA: Diagnosis not present

## 2015-04-22 DIAGNOSIS — M5414 Radiculopathy, thoracic region: Secondary | ICD-10-CM | POA: Diagnosis not present

## 2015-04-25 DIAGNOSIS — F4310 Post-traumatic stress disorder, unspecified: Secondary | ICD-10-CM | POA: Diagnosis not present

## 2015-04-26 DIAGNOSIS — F322 Major depressive disorder, single episode, severe without psychotic features: Secondary | ICD-10-CM | POA: Diagnosis not present

## 2015-04-27 DIAGNOSIS — F4310 Post-traumatic stress disorder, unspecified: Secondary | ICD-10-CM | POA: Diagnosis not present

## 2015-05-02 DIAGNOSIS — F4310 Post-traumatic stress disorder, unspecified: Secondary | ICD-10-CM | POA: Diagnosis not present

## 2015-05-04 ENCOUNTER — Ambulatory Visit: Payer: Medicare Other | Admitting: Family Medicine

## 2015-05-04 VITALS — BP 138/80 | HR 79 | Temp 98.6°F | Resp 20 | Ht 63.0 in | Wt 177.8 lb

## 2015-05-04 DIAGNOSIS — M9902 Segmental and somatic dysfunction of thoracic region: Secondary | ICD-10-CM | POA: Diagnosis not present

## 2015-05-04 DIAGNOSIS — M9903 Segmental and somatic dysfunction of lumbar region: Secondary | ICD-10-CM | POA: Diagnosis not present

## 2015-05-04 DIAGNOSIS — M5414 Radiculopathy, thoracic region: Secondary | ICD-10-CM | POA: Diagnosis not present

## 2015-05-04 DIAGNOSIS — M9901 Segmental and somatic dysfunction of cervical region: Secondary | ICD-10-CM | POA: Diagnosis not present

## 2015-05-04 DIAGNOSIS — M531 Cervicobrachial syndrome: Secondary | ICD-10-CM | POA: Diagnosis not present

## 2015-05-04 DIAGNOSIS — M5408 Panniculitis affecting regions of neck and back, sacral and sacrococcygeal region: Secondary | ICD-10-CM | POA: Diagnosis not present

## 2015-05-04 DIAGNOSIS — F4310 Post-traumatic stress disorder, unspecified: Secondary | ICD-10-CM | POA: Diagnosis not present

## 2015-05-04 NOTE — Progress Notes (Signed)
Not seen

## 2015-05-05 ENCOUNTER — Ambulatory Visit (INDEPENDENT_AMBULATORY_CARE_PROVIDER_SITE_OTHER): Payer: Medicare Other | Admitting: Family Medicine

## 2015-05-05 ENCOUNTER — Telehealth: Payer: Self-pay

## 2015-05-05 VITALS — BP 112/82 | HR 87 | Temp 98.1°F | Resp 16 | Ht 63.0 in | Wt 178.4 lb

## 2015-05-05 DIAGNOSIS — G4700 Insomnia, unspecified: Secondary | ICD-10-CM

## 2015-05-05 MED ORDER — DOXYCYCLINE HYCLATE 100 MG PO TABS: 100.0000 mg | ORAL_TABLET | Freq: Two times a day (BID) | ORAL | Status: DC

## 2015-05-05 MED ORDER — TEMAZEPAM 15 MG PO CAPS: 15.0000 mg | ORAL_CAPSULE | Freq: Every day | ORAL | Status: DC

## 2015-05-05 NOTE — Progress Notes (Signed)
Subjective:  This chart was scribed for [name], MD by Leandra Kern, Medical Scribe. This patient was seen in Room 11 and the patient's care was started at 2:43 PM.   Patient ID: Tamara [name], female    DOB: 11/21/1960, 54 y.o.   MRN: [id number]  Chief Complaint  Patient presents with   Follow-up    says she needs a prescription for doxycycline    HPI HPI Comments: Tamara [name] is a 54 y.o. female who presents to [facility] requesting a medication refill for doxycycline.  Pt currently takes 100 mg tablets BID of the medication, and is requesting 180 mg tablets.   Pt also presents with chronic back pain, however she notes that she is suffering from muscle spasms after lifting a table. She indicates that she does home exercises and follows up with a chiropractor for her chronic symptoms. She has not followed up with an orthopedic yet. She denies radiation of the pain to the leg, weakness, or urinary complications.    Patient Active Problem List   Diagnosis Date Noted   Multinodular non-toxic goiter 02/16/2015   Migraine 11/05/2013   Stroke ([place]) 10/22/2013   ASTHMA 08/01/2010   Hyperlipidemia 09/17/2007   OBESITY 09/17/2007   ANXIETY 09/17/2007   DEPRESSION 09/17/2007   Essential hypertension 09/17/2007   GERD 09/17/2007   Past Medical History  Diagnosis Date   Depression    HTN (hypertension)    Hyperlipemia    Allergy    GERD (gastroesophageal reflux disease)    Neuromuscular disorder (HCC)    Anemia    Anxiety    Asthma    Shock therapy as cause of abnormal reaction of patient or of later complication without mention of misadventure at time of procedure    History of short term memory loss    Diverticulitis    Past Surgical History  Procedure Laterality Date   Cholecystectomy     Abdominal hysterectomy      partial   Colonoscopy     Allergies  Allergen Reactions   Adhesive [Tape]     This is  tape as well as adhesive on bandaids.   Gabapentin     Unknown reaction   Ivp Dye [Iodinated Diagnostic Agents] Swelling   Oxycodone    Propoxyphene Hcl Other (See Comments)    hallucination   Seroquel [Quetiapine Fumerate]     Unknown reaction   Statins     Arm pain   Penicillins Rash   Sulfonamide Derivatives Rash   Prior to Admission medications   Medication Sig Start Date End Date Taking? Authorizing Provider  albuterol (PROVENTIL HFA;VENTOLIN HFA) 108 (90 BASE) MCG/ACT inhaler Inhale 2 puffs into the lungs every 6 (six) hours as needed for wheezing or shortness of breath. 09/02/14  Yes [name], MD  ALPRAZolam (ALPRAZOLAM XR) 1 MG 24 hr tablet Take 3 tablets (3 mg total) by mouth at bedtime. 09/02/14  Yes [name], MD  ALPRAZolam Duanne Moron) 1 MG tablet Take 1 tablet (1 mg total) by mouth daily as needed for anxiety. 09/02/14  Yes [name], MD  aspirin 325 MG tablet Take 1 tablet (325 mg total) by mouth daily. 10/22/13  Yes Bernadene Bell, MD  baclofen (LIORESAL) 10 MG tablet Take 1 tablet (10 mg total) by mouth 3 (three) times daily. 09/02/14  Yes [name], MD  Biotin 5000 MCG CAPS Take 1 capsule by mouth daily.   Yes Historical Provider, MD  Chlorophyll 100 MG TABS Take 1 tablet by mouth daily.   Yes Historical Provider, MD  Cholecalciferol (VITAMIN D-3) 1000 UNITS CAPS Take 1 capsule by mouth daily.   Yes Historical Provider, MD  Cholecalciferol (VITAMIN D3) 50000 UNITS CAPS Take 50,000 Units by mouth once a week. 09/24/14  Yes [name], MD  dexlansoprazole (DEXILANT) 60 MG capsule Take 1 capsule (60 mg total) by mouth daily. 09/02/14  Yes [name], MD  diclofenac sodium (VOLTAREN) 1 % GEL Apply 2 g topically daily as needed (for pain). 09/02/14  Yes [name], MD  fenofibrate (TRICOR) 145 MG tablet Take 1 tablet (145 mg total) by mouth daily. 01/19/15  Yes [name], MD  Nyoka Cowden Tea, Camillia sinensis, (GREEN TEA EXTRACT PO) Take 400 mg  by mouth daily.   Yes Historical Provider, MD  HYDROcodone-acetaminophen (NORCO/VICODIN) 5-325 MG per tablet Take 1 tablet by mouth daily as needed. 06/18/14  Yes Historical Provider, MD  LYSINE PO Take 1 tablet by mouth as needed. prn   Yes Historical Provider, MD  MAGNESIUM GLYCINATE PLUS PO Take 100 mg by mouth daily.   Yes Historical Provider, MD  OVER THE COUNTER MEDICATION R-Lipoic Acid. Take 1 tablet by mouth daily.   Yes Historical Provider, MD  Oxcarbazepine (TRILEPTAL) 300 MG tablet Take 1 tablet (300 mg total) by mouth 2 (two) times daily. 09/02/14  Yes [name], MD  perphenazine (TRILAFON) 4 MG tablet Take 4 mg by mouth 2 (two) times daily.   Yes Historical Provider, MD  Riboflavin 400 MG CAPS Take 1 capsule by mouth daily.   Yes Historical Provider, MD  Taurine 1000 MG CAPS Take 1 capsule by mouth daily.   Yes Historical Provider, MD  temazepam (RESTORIL) 15 MG capsule Take 3 capsules (45 mg total) by mouth at bedtime. 09/02/14  Yes [name], MD  vitamin C (ASCORBIC ACID) 500 MG tablet Take 500 mg by mouth daily.   Yes Historical Provider, MD  ASHWAGANDHA PO Take 670 mg by mouth daily.    Historical Provider, MD  cyclobenzaprine (FLEXERIL) 10 MG tablet Take 1 tablet (10 mg total) by mouth at bedtime as needed for muscle spasms. Patient not taking: Reported on 05/05/2015 08/07/14   Gregor Hams, MD   Social History   Social History   Marital Status: Divorced    Spouse Name: N/A   Number of Children: 3   Years of Education: college   Occupational History        Disabled   Social History Main Topics   Smoking status: Never Smoker    Smokeless tobacco: Never Used   Alcohol Use: No   Drug Use: No   Sexual Activity: Yes    Museum/gallery curator: None   Other Topics Concern   Not on file   Social History Narrative   Lives with boyfriend. Walks daily for 20 minutes. Education: [employer].    Review of Systems  Genitourinary: Negative for dysuria.    Musculoskeletal: Positive for back pain.  Neurological: Negative for weakness.      Objective:   Physical Exam  Constitutional: She is oriented to person, place, and time. She appears well-developed and well-nourished. No distress.  HENT:  Head: Normocephalic and atraumatic.  Eyes: EOM are normal. Pupils are equal, round, and reactive to light.  Neck: Neck supple.  Cardiovascular: Normal rate.   Pulmonary/Chest: Effort normal.  Neurological: She is alert and oriented to person, place, and time. No cranial nerve deficit.  Skin: Skin  is warm and dry.  Psychiatric: She has a normal mood and affect. Her behavior is normal.  Nursing note and vitals reviewed.  Moving easily with stable gait throughout the offic BP 112/82 mmHg   Pulse 87   Temp(Src) 98.1 F (36.7 C) (Oral)   Resp 16   Ht 5\' 3"  (1.6 m)   Wt 178 lb 6.4 oz (80.922 kg)   BMI 31.61 kg/m2   SpO2 98%     Assessment & Plan:     By signing my name below, I, Rawaa Al Rifaie, attest that this documentation has been prepared under the direction and in the presence of [name], MD.  Leandra Kern, Medical Scribe. 05/05/2015.  2:55 PM.  This chart was scribed in my presence and reviewed by me personally.    ICD-9-CM ICD-10-CM   1. Insomnia 780.52 G47.00 temazepam (RESTORIL) 15 MG capsule  low back pain to be treated with baclofen   Signed, [name], MD

## 2015-05-05 NOTE — Telephone Encounter (Signed)
I was busy seeing patients until 4:15pm.

## 2015-05-05 NOTE — Telephone Encounter (Signed)
Pt came in around 3 pm yesterday to see Dr. Carlean Jews (scheduled to work 8-4). Was triaged at 3:30 and waited to see him. Was told after 4pm she would not be able to see him today. Pt would like to understand why she was unable to be seen when it did not appear to be that busy and she was roomed and ready before he was scheduled to leave.  [phone]

## 2015-05-05 NOTE — Patient Instructions (Signed)
Use the baclofen as directed for a muscle tightness in the low back.  If not relieving your symptoms in 48 hours, let me know.  I sent the doxycyline off to [facility].

## 2015-05-06 NOTE — Telephone Encounter (Signed)
Pt came in 11/3 to see Dr. Carlean Jews , we fast tracked her as a courtesy and he will discuss the 11/2 visit with her.

## 2015-05-10 DIAGNOSIS — F329 Major depressive disorder, single episode, unspecified: Secondary | ICD-10-CM | POA: Diagnosis not present

## 2015-05-12 DIAGNOSIS — F4310 Post-traumatic stress disorder, unspecified: Secondary | ICD-10-CM | POA: Diagnosis not present

## 2015-05-18 DIAGNOSIS — M9903 Segmental and somatic dysfunction of lumbar region: Secondary | ICD-10-CM | POA: Diagnosis not present

## 2015-05-18 DIAGNOSIS — M5408 Panniculitis affecting regions of neck and back, sacral and sacrococcygeal region: Secondary | ICD-10-CM | POA: Diagnosis not present

## 2015-05-18 DIAGNOSIS — M9901 Segmental and somatic dysfunction of cervical region: Secondary | ICD-10-CM | POA: Diagnosis not present

## 2015-05-18 DIAGNOSIS — M5414 Radiculopathy, thoracic region: Secondary | ICD-10-CM | POA: Diagnosis not present

## 2015-05-18 DIAGNOSIS — M9902 Segmental and somatic dysfunction of thoracic region: Secondary | ICD-10-CM | POA: Diagnosis not present

## 2015-05-18 DIAGNOSIS — M531 Cervicobrachial syndrome: Secondary | ICD-10-CM | POA: Diagnosis not present

## 2015-05-23 DIAGNOSIS — F4310 Post-traumatic stress disorder, unspecified: Secondary | ICD-10-CM | POA: Diagnosis not present

## 2015-05-24 DIAGNOSIS — M5414 Radiculopathy, thoracic region: Secondary | ICD-10-CM | POA: Diagnosis not present

## 2015-05-24 DIAGNOSIS — M531 Cervicobrachial syndrome: Secondary | ICD-10-CM | POA: Diagnosis not present

## 2015-05-24 DIAGNOSIS — M9901 Segmental and somatic dysfunction of cervical region: Secondary | ICD-10-CM | POA: Diagnosis not present

## 2015-05-24 DIAGNOSIS — M9902 Segmental and somatic dysfunction of thoracic region: Secondary | ICD-10-CM | POA: Diagnosis not present

## 2015-05-24 DIAGNOSIS — M9903 Segmental and somatic dysfunction of lumbar region: Secondary | ICD-10-CM | POA: Diagnosis not present

## 2015-05-24 DIAGNOSIS — M5408 Panniculitis affecting regions of neck and back, sacral and sacrococcygeal region: Secondary | ICD-10-CM | POA: Diagnosis not present

## 2015-05-25 DIAGNOSIS — Z779 Other contact with and (suspected) exposures hazardous to health: Secondary | ICD-10-CM | POA: Diagnosis not present

## 2015-05-30 DIAGNOSIS — F4310 Post-traumatic stress disorder, unspecified: Secondary | ICD-10-CM | POA: Diagnosis not present

## 2015-05-31 DIAGNOSIS — F4310 Post-traumatic stress disorder, unspecified: Secondary | ICD-10-CM | POA: Diagnosis not present

## 2015-06-06 DIAGNOSIS — M5414 Radiculopathy, thoracic region: Secondary | ICD-10-CM | POA: Diagnosis not present

## 2015-06-06 DIAGNOSIS — M9901 Segmental and somatic dysfunction of cervical region: Secondary | ICD-10-CM | POA: Diagnosis not present

## 2015-06-06 DIAGNOSIS — M5408 Panniculitis affecting regions of neck and back, sacral and sacrococcygeal region: Secondary | ICD-10-CM | POA: Diagnosis not present

## 2015-06-06 DIAGNOSIS — M9903 Segmental and somatic dysfunction of lumbar region: Secondary | ICD-10-CM | POA: Diagnosis not present

## 2015-06-06 DIAGNOSIS — M531 Cervicobrachial syndrome: Secondary | ICD-10-CM | POA: Diagnosis not present

## 2015-06-06 DIAGNOSIS — M9902 Segmental and somatic dysfunction of thoracic region: Secondary | ICD-10-CM | POA: Diagnosis not present

## 2015-06-07 DIAGNOSIS — F4310 Post-traumatic stress disorder, unspecified: Secondary | ICD-10-CM | POA: Diagnosis not present

## 2015-06-17 DIAGNOSIS — F329 Major depressive disorder, single episode, unspecified: Secondary | ICD-10-CM | POA: Diagnosis not present

## 2015-06-21 DIAGNOSIS — F4310 Post-traumatic stress disorder, unspecified: Secondary | ICD-10-CM | POA: Diagnosis not present

## 2015-06-22 DIAGNOSIS — M5408 Panniculitis affecting regions of neck and back, sacral and sacrococcygeal region: Secondary | ICD-10-CM | POA: Diagnosis not present

## 2015-06-22 DIAGNOSIS — M9901 Segmental and somatic dysfunction of cervical region: Secondary | ICD-10-CM | POA: Diagnosis not present

## 2015-06-22 DIAGNOSIS — M9902 Segmental and somatic dysfunction of thoracic region: Secondary | ICD-10-CM | POA: Diagnosis not present

## 2015-06-22 DIAGNOSIS — M531 Cervicobrachial syndrome: Secondary | ICD-10-CM | POA: Diagnosis not present

## 2015-06-22 DIAGNOSIS — M9903 Segmental and somatic dysfunction of lumbar region: Secondary | ICD-10-CM | POA: Diagnosis not present

## 2015-06-22 DIAGNOSIS — M5414 Radiculopathy, thoracic region: Secondary | ICD-10-CM | POA: Diagnosis not present

## 2015-06-23 DIAGNOSIS — F4310 Post-traumatic stress disorder, unspecified: Secondary | ICD-10-CM | POA: Diagnosis not present

## 2015-06-28 DIAGNOSIS — F4310 Post-traumatic stress disorder, unspecified: Secondary | ICD-10-CM | POA: Diagnosis not present

## 2015-06-30 DIAGNOSIS — F4310 Post-traumatic stress disorder, unspecified: Secondary | ICD-10-CM | POA: Diagnosis not present

## 2015-07-05 ENCOUNTER — Ambulatory Visit (INDEPENDENT_AMBULATORY_CARE_PROVIDER_SITE_OTHER): Payer: Medicare Other | Admitting: Internal Medicine

## 2015-07-05 VITALS — BP 142/90 | HR 114 | Temp 98.3°F | Resp 18 | Ht 62.0 in | Wt 178.8 lb

## 2015-07-05 DIAGNOSIS — E785 Hyperlipidemia, unspecified: Secondary | ICD-10-CM | POA: Diagnosis not present

## 2015-07-05 DIAGNOSIS — J069 Acute upper respiratory infection, unspecified: Secondary | ICD-10-CM | POA: Diagnosis not present

## 2015-07-05 DIAGNOSIS — B9789 Other viral agents as the cause of diseases classified elsewhere: Secondary | ICD-10-CM

## 2015-07-05 DIAGNOSIS — G44209 Tension-type headache, unspecified, not intractable: Secondary | ICD-10-CM | POA: Diagnosis not present

## 2015-07-05 DIAGNOSIS — J4520 Mild intermittent asthma, uncomplicated: Secondary | ICD-10-CM

## 2015-07-05 MED ORDER — DICLOFENAC SODIUM 1 % TD GEL: 2.0000 g | Freq: Every day | TRANSDERMAL | Status: DC | PRN

## 2015-07-05 MED ORDER — BACLOFEN 10 MG PO TABS: 10.0000 mg | ORAL_TABLET | Freq: Three times a day (TID) | ORAL | Status: DC

## 2015-07-05 MED ORDER — DOXYCYCLINE HYCLATE 100 MG PO TABS: 100.0000 mg | ORAL_TABLET | Freq: Two times a day (BID) | ORAL | Status: DC

## 2015-07-05 MED ORDER — ALBUTEROL SULFATE HFA 108 (90 BASE) MCG/ACT IN AERS: 2.0000 | INHALATION_SPRAY | Freq: Four times a day (QID) | RESPIRATORY_TRACT | Status: AC | PRN

## 2015-07-05 MED ORDER — METHYLPREDNISOLONE ACETATE 80 MG/ML IJ SUSP
80.0000 mg | Freq: Once | INTRAMUSCULAR | Status: AC
  Administered 2015-07-05: 80 mg via INTRAMUSCULAR

## 2015-07-05 MED ORDER — FENOFIBRATE 145 MG PO TABS: 145.0000 mg | ORAL_TABLET | Freq: Every day | ORAL | Status: DC

## 2015-07-05 NOTE — Progress Notes (Signed)
I mailed pt's Rxs to [facility] in [place] (address on [address]).

## 2015-07-05 NOTE — Progress Notes (Signed)
Subjective:    Patient ID: Tamara [name], female    DOB: 12/26/1960, 55 y.o.   MRN: [id number]  HPI  Chief Complaint  Patient presents with  . Cough    x3 days  . Nasal Congestion    stuffy nose  . Sore Throat  otalgia on R No fever Hx asthma, and starting to wheeze--this is typical progression for her and she usually gets depomedr to avoid progression to intense asthma--See Dr Lenn Cal notes  Patient Active Problem List   Diagnosis Date Noted  . Multinodular non-toxic goiter 02/16/2015  . Migraine 11/05/2013  . Stroke ([place]) 10/22/2013  . ASTHMA 08/01/2010  . Hyperlipidemia 09/17/2007  . OBESITY 09/17/2007  . ANXIETY 09/17/2007  . DEPRESSION 09/17/2007  . Essential hypertension 09/17/2007  . GERD 09/17/2007    Current outpatient prescriptions:  .  albuterol (PROVENTIL HFA;VENTOLIN HFA) 108 (90 BASE) MCG/ACT inhaler, Inhale 2 puffs into the lungs every 6 (six) hours as needed for wheezing or shortness of breath., Disp: 1 Inhaler, Rfl: 3 .  ALPRAZolam (ALPRAZOLAM XR) 1 MG 24 hr tablet, Take 3 tablets (3 mg total) by mouth at bedtime., Disp: 90 tablet, Rfl: 5 .  ALPRAZolam (XANAX) 1 MG tablet, Take 1 tablet (1 mg total) by mouth daily as needed for anxiety., Disp: 30 tablet, Rfl: 3 .  aspirin 325 MG tablet, Take 1 tablet (325 mg total) by mouth daily., Disp: 30 tablet, Rfl: 0 .  baclofen (LIORESAL) 10 MG tablet, Take 1 tablet (10 mg total) by mouth 3 (three) times daily., Disp: 30 each, Rfl: 11 .  Biotin 5000 MCG CAPS, Take 1 capsule by mouth daily., Disp: , Rfl:  .  Chlorophyll 100 MG TABS, Take 1 tablet by mouth daily., Disp: , Rfl:  .  Cholecalciferol (VITAMIN D-3) 1000 UNITS CAPS, Take 1 capsule by mouth daily., Disp: , Rfl:  .  Cholecalciferol (VITAMIN D3) 50000 UNITS CAPS, Take 50,000 Units by mouth once a week., Disp: 12 capsule, Rfl: 2 .  dexlansoprazole (DEXILANT) 60 MG capsule, Take 1 capsule (60 mg total) by mouth daily., Disp: 90 capsule, Rfl: 3 .  diclofenac sodium  (VOLTAREN) 1 % GEL, Apply 2 g topically daily as needed (for pain)., Disp: 100 g, Rfl: 3 .  doxycycline (VIBRA-TABS) 100 MG tablet, Take 1 tablet (100 mg total) by mouth 2 (two) times daily., Disp: 180 tablet, Rfl: 3 .  fenofibrate (TRICOR) 145 MG tablet, Take 1 tablet (145 mg total) by mouth daily., Disp: 90 tablet, Rfl: 1 .  Green Tea, Camillia sinensis, (GREEN TEA EXTRACT PO), Take 400 mg by mouth daily., Disp: , Rfl:  .  LYSINE PO, Take 1 tablet by mouth as needed. prn, Disp: , Rfl:  .  MAGNESIUM GLYCINATE PLUS PO, Take 100 mg by mouth daily., Disp: , Rfl:  .  OVER THE COUNTER MEDICATION, R-Lipoic Acid. Take 1 tablet by mouth daily., Disp: , Rfl:  .  Oxcarbazepine (TRILEPTAL) 300 MG tablet, Take 1 tablet (300 mg total) by mouth 2 (two) times daily., Disp: 60 tablet, Rfl: 5 .  perphenazine (TRILAFON) 4 MG tablet, Take 4 mg by mouth 2 (two) times daily., Disp: , Rfl:  .  Riboflavin 400 MG CAPS, Take 1 capsule by mouth daily., Disp: , Rfl:  .  Taurine 1000 MG CAPS, Take 1 capsule by mouth daily., Disp: , Rfl:  .  temazepam (RESTORIL) 15 MG capsule, Take 1 capsule (15 mg total) by mouth at bedtime., Disp: 90 capsule, Rfl: 3 .  vitamin C (ASCORBIC ACID) 500 MG tablet, Take 500 mg by mouth daily., Disp: , Rfl:   Needs a few refills Review of Systems [place]    Objective:   Physical Exam BP 142/90 mmHg  Pulse 114  Temp(Src) 98.3 F (36.8 C) (Oral)  Resp 18  Ht 5\' 2"  (1.575 m)  Wt 178 lb 12.8 oz (81.103 kg)  BMI 32.69 kg/m2  SpO2 98% PERRLA clear conjunctiva TMs are clear Nares clear RHINORRHEA Mildly tender R AC node Throat clear without exudate or redness Chest mild wheezing on forced expiration bilaterally but no rales or rhonchi Heart regular without murmur       Assessment & Plan:  RAD (reactive airway disease), mild intermittent, uncomplicated  Viral URI with cough  Meds ordered this encounter  Medications  . methylPREDNISolone acetate (DEPO-MEDROL) injection 80 mg     Sig:   f/u if not responding Contin reg meds  Refills Meds ordered this encounter  Medications  . albuterol (PROVENTIL HFA;VENTOLIN HFA) 108 (90 Base) MCG/ACT inhaler    Sig: Inhale 2 puffs into the lungs every 6 (six) hours as needed for wheezing or shortness of breath.    Dispense:  1 Inhaler    Refill:  3  . diclofenac sodium (VOLTAREN) 1 % GEL    Sig: Apply 2 g topically daily as needed (for pain).    Dispense:  100 g    Refill:  3  . baclofen (LIORESAL) 10 MG tablet    Sig: Take 1 tablet (10 mg total) by mouth 3 (three) times daily.    Dispense:  30 each    Refill:  11  . doxycycline (VIBRA-TABS) 100 MG tablet    Sig: Take 1 tablet (100 mg total) by mouth 2 (two) times daily.    Dispense:  180 tablet    Refill:  3  . fenofibrate (TRICOR) 145 MG tablet    Sig: Take 1 tablet (145 mg total) by mouth daily.    Dispense:  90 tablet    Refill:  1

## 2015-07-07 ENCOUNTER — Telehealth: Payer: Self-pay

## 2015-07-07 NOTE — Telephone Encounter (Signed)
Patient is calling because she was recently seen and is now feeling worse. She states she has a sinus infection and needs an antibiotic.  [facility] on [address]

## 2015-07-07 NOTE — Telephone Encounter (Signed)
Pt called to check the status of her request... Asked that she allows 24-48 hrs   [phone]

## 2015-07-08 NOTE — Telephone Encounter (Signed)
Yes she is on doxycycline. So she will stay on that. Pt understood.

## 2015-07-08 NOTE — Telephone Encounter (Signed)
Isn';t she on doxycycline??? Long term rx?? If not , can she take omnicef??

## 2015-07-13 DIAGNOSIS — F4310 Post-traumatic stress disorder, unspecified: Secondary | ICD-10-CM | POA: Diagnosis not present

## 2015-07-15 ENCOUNTER — Telehealth: Payer: Self-pay | Admitting: Internal Medicine

## 2015-07-15 ENCOUNTER — Other Ambulatory Visit: Payer: Self-pay | Admitting: Internal Medicine

## 2015-07-15 ENCOUNTER — Other Ambulatory Visit: Payer: Self-pay

## 2015-07-15 DIAGNOSIS — E042 Nontoxic multinodular goiter: Secondary | ICD-10-CM

## 2015-07-15 DIAGNOSIS — F4310 Post-traumatic stress disorder, unspecified: Secondary | ICD-10-CM | POA: Diagnosis not present

## 2015-07-15 DIAGNOSIS — Z1231 Encounter for screening mammogram for malignant neoplasm of breast: Secondary | ICD-10-CM

## 2015-07-15 NOTE — Telephone Encounter (Signed)
Called Tamara [name] and lvm advising her that Dr Cruzita Lederer has ordered it and the [facility] should contact her next week.

## 2015-07-15 NOTE — Telephone Encounter (Signed)
I ordered the biopsy.

## 2015-07-15 NOTE — Telephone Encounter (Signed)
Please read message below.  

## 2015-07-15 NOTE — Telephone Encounter (Signed)
Patient called and would like to have an order submitted for her thyroid biopsy She states she was supposed to have one in September but there was some confusion    Please advise   Thank you

## 2015-07-19 DIAGNOSIS — F4310 Post-traumatic stress disorder, unspecified: Secondary | ICD-10-CM | POA: Diagnosis not present

## 2015-07-21 DIAGNOSIS — F4310 Post-traumatic stress disorder, unspecified: Secondary | ICD-10-CM | POA: Diagnosis not present

## 2015-07-25 DIAGNOSIS — F4310 Post-traumatic stress disorder, unspecified: Secondary | ICD-10-CM | POA: Diagnosis not present

## 2015-07-27 DIAGNOSIS — F4310 Post-traumatic stress disorder, unspecified: Secondary | ICD-10-CM | POA: Diagnosis not present

## 2015-08-01 DIAGNOSIS — F4310 Post-traumatic stress disorder, unspecified: Secondary | ICD-10-CM | POA: Diagnosis not present

## 2015-08-01 DIAGNOSIS — M5386 Other specified dorsopathies, lumbar region: Secondary | ICD-10-CM | POA: Diagnosis not present

## 2015-08-01 DIAGNOSIS — M9902 Segmental and somatic dysfunction of thoracic region: Secondary | ICD-10-CM | POA: Diagnosis not present

## 2015-08-01 DIAGNOSIS — M9903 Segmental and somatic dysfunction of lumbar region: Secondary | ICD-10-CM | POA: Diagnosis not present

## 2015-08-01 DIAGNOSIS — M5032 Other cervical disc degeneration, mid-cervical region, unspecified level: Secondary | ICD-10-CM | POA: Diagnosis not present

## 2015-08-01 DIAGNOSIS — M5414 Radiculopathy, thoracic region: Secondary | ICD-10-CM | POA: Diagnosis not present

## 2015-08-01 DIAGNOSIS — M9901 Segmental and somatic dysfunction of cervical region: Secondary | ICD-10-CM | POA: Diagnosis not present

## 2015-08-02 DIAGNOSIS — F4310 Post-traumatic stress disorder, unspecified: Secondary | ICD-10-CM | POA: Diagnosis not present

## 2015-08-03 DIAGNOSIS — M5386 Other specified dorsopathies, lumbar region: Secondary | ICD-10-CM | POA: Diagnosis not present

## 2015-08-03 DIAGNOSIS — M9903 Segmental and somatic dysfunction of lumbar region: Secondary | ICD-10-CM | POA: Diagnosis not present

## 2015-08-03 DIAGNOSIS — M5414 Radiculopathy, thoracic region: Secondary | ICD-10-CM | POA: Diagnosis not present

## 2015-08-03 DIAGNOSIS — M9901 Segmental and somatic dysfunction of cervical region: Secondary | ICD-10-CM | POA: Diagnosis not present

## 2015-08-03 DIAGNOSIS — M5032 Other cervical disc degeneration, mid-cervical region, unspecified level: Secondary | ICD-10-CM | POA: Diagnosis not present

## 2015-08-03 DIAGNOSIS — M9902 Segmental and somatic dysfunction of thoracic region: Secondary | ICD-10-CM | POA: Diagnosis not present

## 2015-08-05 ENCOUNTER — Telehealth: Payer: Self-pay | Admitting: Family Medicine

## 2015-08-05 DIAGNOSIS — M9901 Segmental and somatic dysfunction of cervical region: Secondary | ICD-10-CM | POA: Diagnosis not present

## 2015-08-05 DIAGNOSIS — M5414 Radiculopathy, thoracic region: Secondary | ICD-10-CM | POA: Diagnosis not present

## 2015-08-05 DIAGNOSIS — M5032 Other cervical disc degeneration, mid-cervical region, unspecified level: Secondary | ICD-10-CM | POA: Diagnosis not present

## 2015-08-05 DIAGNOSIS — M5386 Other specified dorsopathies, lumbar region: Secondary | ICD-10-CM | POA: Diagnosis not present

## 2015-08-05 DIAGNOSIS — M9902 Segmental and somatic dysfunction of thoracic region: Secondary | ICD-10-CM | POA: Diagnosis not present

## 2015-08-05 DIAGNOSIS — F329 Major depressive disorder, single episode, unspecified: Secondary | ICD-10-CM | POA: Diagnosis not present

## 2015-08-05 DIAGNOSIS — M9903 Segmental and somatic dysfunction of lumbar region: Secondary | ICD-10-CM | POA: Diagnosis not present

## 2015-08-05 NOTE — Telephone Encounter (Signed)
Pt came in today( Friday) with a form for Dr Joseph Art to sign I put the paper work in his box all he has to do is sign his named i filled the rest out

## 2015-08-09 ENCOUNTER — Telehealth: Payer: Self-pay

## 2015-08-09 NOTE — Telephone Encounter (Signed)
Left message for patient to call back.  Her paperwork for Bernita Buffy is complete by Dr. Joseph Art.    Does she have an envelope, want it faxed, if YES, what is the fax number, want to pick it up?

## 2015-08-10 NOTE — Telephone Encounter (Signed)
Patient called back stated she was returning April's call, stated that the forms need to be signed and address and fax number of our office needs to be completed and please call her once this is done, did not state if she wanted it faxed or if she wants to pick up.  Her call back number is [phone].  Called MR voicemail on Tuesday 08/09/15 at 1210pm

## 2015-08-17 ENCOUNTER — Telehealth: Payer: Self-pay

## 2015-08-17 DIAGNOSIS — E785 Hyperlipidemia, unspecified: Secondary | ICD-10-CM

## 2015-08-17 NOTE — Telephone Encounter (Signed)
Patient needs a prescription for senofibrate sent to [place]. [phone]

## 2015-08-18 DIAGNOSIS — J069 Acute upper respiratory infection, unspecified: Secondary | ICD-10-CM | POA: Diagnosis not present

## 2015-08-18 DIAGNOSIS — J111 Influenza due to unidentified influenza virus with other respiratory manifestations: Secondary | ICD-10-CM | POA: Diagnosis not present

## 2015-08-19 MED ORDER — FENOFIBRATE 145 MG PO TABS: 145.0000 mg | ORAL_TABLET | Freq: Every day | ORAL | Status: DC

## 2015-08-19 NOTE — Telephone Encounter (Signed)
Pharm should already have this sent in last month. Pt advised that there is a problem with the mail order saying it is too soon to send out and that if we send in another Rx it will help. I will re-send the RFs.

## 2015-09-05 DIAGNOSIS — F4310 Post-traumatic stress disorder, unspecified: Secondary | ICD-10-CM | POA: Diagnosis not present

## 2015-09-06 ENCOUNTER — Inpatient Hospital Stay: Admission: RE | Admit: 2015-09-06 | Payer: Medicare Other | Source: Ambulatory Visit

## 2015-09-06 DIAGNOSIS — F4310 Post-traumatic stress disorder, unspecified: Secondary | ICD-10-CM | POA: Diagnosis not present

## 2015-09-07 ENCOUNTER — Ambulatory Visit (INDEPENDENT_AMBULATORY_CARE_PROVIDER_SITE_OTHER): Payer: Medicare Other | Admitting: Family Medicine

## 2015-09-07 VITALS — BP 130/90 | HR 83 | Temp 98.1°F | Resp 16 | Ht 62.5 in | Wt 179.0 lb

## 2015-09-07 DIAGNOSIS — M9902 Segmental and somatic dysfunction of thoracic region: Secondary | ICD-10-CM | POA: Diagnosis not present

## 2015-09-07 DIAGNOSIS — M5417 Radiculopathy, lumbosacral region: Secondary | ICD-10-CM | POA: Diagnosis not present

## 2015-09-07 DIAGNOSIS — I10 Essential (primary) hypertension: Secondary | ICD-10-CM | POA: Diagnosis not present

## 2015-09-07 DIAGNOSIS — M9901 Segmental and somatic dysfunction of cervical region: Secondary | ICD-10-CM | POA: Diagnosis not present

## 2015-09-07 DIAGNOSIS — M5414 Radiculopathy, thoracic region: Secondary | ICD-10-CM | POA: Diagnosis not present

## 2015-09-07 DIAGNOSIS — M530 Cervicocranial syndrome: Secondary | ICD-10-CM | POA: Diagnosis not present

## 2015-09-07 DIAGNOSIS — M9903 Segmental and somatic dysfunction of lumbar region: Secondary | ICD-10-CM | POA: Diagnosis not present

## 2015-09-07 DIAGNOSIS — F411 Generalized anxiety disorder: Secondary | ICD-10-CM | POA: Diagnosis not present

## 2015-09-07 MED ORDER — LOSARTAN POTASSIUM 100 MG PO TABS
100.0000 mg | ORAL_TABLET | Freq: Every day | ORAL | Status: DC
Start: 2015-09-07 — End: 2020-08-05

## 2015-09-07 NOTE — Progress Notes (Signed)
Is a 55 year old woman who his chronic problems with depression and anxiety. She recently found out that her partner 15 years his rewritten as well , leading her out. This is been very stressful for her and she did having headaches and noticed that her blood pressures higher.    she went to the chiropractor today and was seeing yellow spots for a while.   this business with her boyfriend is brought up all the old trust issues she had with her former husband. Along with the headaches and elevated blood pressure, patient's been having intermittent chest pains. These seem to be relieved by taking Xanax. She took a Xanax about an hour before coming over here blood pressures responded nicely.   she just got back from a month-long visit to [place] with her partner Merry Proud. She is working on resolving her mother's estate area she still mourning her mom. She's had to do with her sister regarding this estate settlement.   Objective: BP 130/90 mmHg  Pulse 83  Temp(Src) 98.1 F (36.7 C) (Oral)  Resp 16  Ht 5' 2.5" (1.588 m)  Wt 179 lb (81.194 kg)  BMI 32.20 kg/m2  SpO2 99%  Wt Readings from Last 3 Encounters:  09/07/15 179 lb (81.194 kg)  07/05/15 178 lb 12.8 oz (81.103 kg)  05/05/15 178 lb 6.4 oz (80.922 kg)   , I spent 20/25 minutes face-to-face discussing these issues with patient  Chest: Clear  HEENT unremarkable Heart: Regular no murmur  This chart was scribed in my presence and reviewed by me personally.    ICD-9-CM ICD-10-CM   1. Essential hypertension 401.9 I10 losartan (COZAAR) 100 MG tablet  2. Anxiety state 300.00 F41.1      Signed, [name], MD

## 2015-09-08 DIAGNOSIS — M9902 Segmental and somatic dysfunction of thoracic region: Secondary | ICD-10-CM | POA: Diagnosis not present

## 2015-09-08 DIAGNOSIS — M9903 Segmental and somatic dysfunction of lumbar region: Secondary | ICD-10-CM | POA: Diagnosis not present

## 2015-09-08 DIAGNOSIS — M5417 Radiculopathy, lumbosacral region: Secondary | ICD-10-CM | POA: Diagnosis not present

## 2015-09-08 DIAGNOSIS — M530 Cervicocranial syndrome: Secondary | ICD-10-CM | POA: Diagnosis not present

## 2015-09-08 DIAGNOSIS — M5414 Radiculopathy, thoracic region: Secondary | ICD-10-CM | POA: Diagnosis not present

## 2015-09-08 DIAGNOSIS — M9901 Segmental and somatic dysfunction of cervical region: Secondary | ICD-10-CM | POA: Diagnosis not present

## 2015-09-09 DIAGNOSIS — M5414 Radiculopathy, thoracic region: Secondary | ICD-10-CM | POA: Diagnosis not present

## 2015-09-09 DIAGNOSIS — M530 Cervicocranial syndrome: Secondary | ICD-10-CM | POA: Diagnosis not present

## 2015-09-09 DIAGNOSIS — M9902 Segmental and somatic dysfunction of thoracic region: Secondary | ICD-10-CM | POA: Diagnosis not present

## 2015-09-09 DIAGNOSIS — M9903 Segmental and somatic dysfunction of lumbar region: Secondary | ICD-10-CM | POA: Diagnosis not present

## 2015-09-09 DIAGNOSIS — M5417 Radiculopathy, lumbosacral region: Secondary | ICD-10-CM | POA: Diagnosis not present

## 2015-09-09 DIAGNOSIS — M9901 Segmental and somatic dysfunction of cervical region: Secondary | ICD-10-CM | POA: Diagnosis not present

## 2015-09-12 DIAGNOSIS — F4310 Post-traumatic stress disorder, unspecified: Secondary | ICD-10-CM | POA: Diagnosis not present

## 2015-09-13 ENCOUNTER — Ambulatory Visit (INDEPENDENT_AMBULATORY_CARE_PROVIDER_SITE_OTHER): Payer: Medicare Other | Admitting: Family Medicine

## 2015-09-13 VITALS — BP 123/86 | HR 85 | Temp 97.7°F | Resp 18 | Ht 62.5 in | Wt 180.2 lb

## 2015-09-13 DIAGNOSIS — R0981 Nasal congestion: Secondary | ICD-10-CM | POA: Diagnosis not present

## 2015-09-13 MED ORDER — METHYLPREDNISOLONE ACETATE 80 MG/ML IJ SUSP
80.0000 mg | Freq: Once | INTRAMUSCULAR | Status: AC
  Administered 2015-09-13: 80 mg via INTRAMUSCULAR

## 2015-09-13 NOTE — Progress Notes (Signed)
By signing my name below, I, Moises Blood, attest that this documentation has been prepared under the direction and in the presence of [name], MD. Electronically Signed: Moises Blood, [place]. 09/13/2015 , 3:01 PM .  Patient was seen in room 12 .   Patient ID: Tamara [name] MRN: [id number], DOB: Sep 20, 1960, 55 y.o. Date of Encounter: 09/13/2015  Primary Physician: [name], MD  Chief Complaint:  Chief Complaint  Patient presents with  . congestion  . Cough  . Fever  . Nausea  . Fatigue    HPI:  Tamara [name] is a 55 y.o. female who presents to [facility] complaining of cough with congestion, chills, fever, nausea and feeling fatigue. She denies feeling it in her chest or have any wheezing.   She states that her BP is doing well.  Pt has h/o anxiety, depression, and multiple somatic complaints. She's currently dealing with a lot stress from finding out that her long-time boyfriend's will was changed, to remove her from it. And she's also been dealing with her mother's estate. She was seen 6 days ago for URI.   Past Medical History  Diagnosis Date  . Depression   . HTN (hypertension)   . Hyperlipemia   . Allergy   . GERD (gastroesophageal reflux disease)   . Neuromuscular disorder ([place])   . Anemia   . Anxiety   . Asthma   . Shock therapy as cause of abnormal reaction of patient or of later complication without mention of misadventure at time of procedure   . History of short term memory loss   . Diverticulitis      Home Meds: Prior to Admission medications   Medication Sig Start Date End Date Taking? Authorizing Provider  albuterol (PROVENTIL HFA;VENTOLIN HFA) 108 (90 Base) MCG/ACT inhaler Inhale 2 puffs into the lungs every 6 (six) hours as needed for wheezing or shortness of breath. 07/05/15   Leandrew Koyanagi, MD  ALPRAZolam (ALPRAZOLAM XR) 1 MG 24 hr tablet Take 3 tablets (3 mg total) by mouth at bedtime. 09/02/14   [name], MD  ALPRAZolam Duanne Moron) 1 MG tablet Take 1 tablet (1 mg total) by mouth daily as needed for anxiety. 09/02/14   [name], MD  aspirin 325 MG tablet Take 1 tablet (325 mg total) by mouth daily. 10/22/13   Bernadene Bell, MD  baclofen (LIORESAL) 10 MG tablet Take 1 tablet (10 mg total) by mouth 3 (three) times daily. 07/05/15   Leandrew Koyanagi, MD  Biotin 5000 MCG CAPS Take 1 capsule by mouth daily.    Historical Provider, MD  Chlorophyll 100 MG TABS Take 1 tablet by mouth daily.    Historical Provider, MD  Cholecalciferol (VITAMIN D-3) 1000 UNITS CAPS Take 1 capsule by mouth daily.    Historical Provider, MD  Cholecalciferol (VITAMIN D3) 50000 UNITS CAPS Take 50,000 Units by mouth once a week. 09/24/14   [name], MD  dexlansoprazole (DEXILANT) 60 MG capsule Take 1 capsule (60 mg total) by mouth daily. 09/02/14   [name], MD  diclofenac sodium (VOLTAREN) 1 % GEL Apply 2 g topically daily as needed (for pain). 07/05/15   Leandrew Koyanagi, MD  doxycycline (VIBRA-TABS) 100 MG tablet Take 1 tablet (100 mg total) by mouth 2 (two) times daily. 07/05/15   Leandrew Koyanagi, MD  fenofibrate (TRICOR) 145 MG tablet Take 1 tablet (145 mg total) by mouth daily. 08/19/15   [name], MD  Nyoka Cowden  Tea, Camillia sinensis, (GREEN TEA EXTRACT PO) Take 400 mg by mouth daily.    Historical Provider, MD  losartan (COZAAR) 100 MG tablet Take 1 tablet (100 mg total) by mouth daily. 09/07/15   [name], MD  LYSINE PO Take 1 tablet by mouth as needed. prn    Historical Provider, MD  MAGNESIUM GLYCINATE PLUS PO Take 100 mg by mouth daily.    Historical Provider, MD  OVER THE COUNTER MEDICATION R-Lipoic Acid. Take 1 tablet by mouth daily.    Historical Provider, MD  Oxcarbazepine (TRILEPTAL) 300 MG tablet Take 1 tablet (300 mg total) by mouth 2 (two) times daily. 09/02/14   [name], MD  perphenazine (TRILAFON) 4 MG tablet Take 4 mg by mouth 2 (two) times daily.    Historical Provider,  MD  Riboflavin 400 MG CAPS Take 1 capsule by mouth daily.    Historical Provider, MD  Taurine 1000 MG CAPS Take 1 capsule by mouth daily.    Historical Provider, MD  temazepam (RESTORIL) 15 MG capsule Take 1 capsule (15 mg total) by mouth at bedtime. 05/05/15   [name], MD  vitamin C (ASCORBIC ACID) 500 MG tablet Take 500 mg by mouth daily.    Historical Provider, MD    Allergies:  Allergies  Allergen Reactions  . Adhesive [Tape]     This is tape as well as adhesive on bandaids.  . Gabapentin     Unknown reaction  . Ivp Dye [Iodinated Diagnostic Agents] Swelling  . Oxycodone   . Propoxyphene Hcl Other (See Comments)    hallucination  . Seroquel [Quetiapine Fumerate]     Unknown reaction  . Statins     Arm pain  . Penicillins Rash  . Sulfonamide Derivatives Rash    Social History   Social History  . Marital Status: Divorced    Spouse Name: N/A  . Number of Children: 3  . Years of Education: college   Occupational History  .      Disabled   Social History Main Topics  . Smoking status: Never Smoker   . Smokeless tobacco: Never Used  . Alcohol Use: No  . Drug Use: No  . Sexual Activity: Yes    Birth Control/ Protection: None   Other Topics Concern  . Not on file   Social History Narrative   Lives with boyfriend. Walks daily for 20 minutes. Education: [employer].     Review of Systems: Constitutional: negative for fever, chills, night sweats, weight changes, or fatigue; positive for fever, fatigue HEENT: negative for vision changes, hearing loss, rhinorrhea, ST, epistaxis, or sinus pressure; positive for congestion Cardiovascular: negative for chest pain or palpitations Respiratory: negative for hemoptysis, wheezing, shortness of breath, or cough Abdominal: negative for abdominal pain, vomiting, diarrhea, or constipation; positive for nausea Dermatological: negative for rash Neurologic: negative for headache, dizziness, or syncope All other systems  reviewed and are otherwise negative with the exception to those above and in the HPI.  Physical Exam:  Blood pressure 123/86, pulse 85, temperature 97.7 F (36.5 C), temperature source Oral, resp. rate 18, height 5' 2.5" (1.588 m), weight 180 lb 3.2 oz (81.738 kg), SpO2 99 %., Body mass index is 32.41 kg/(m^2). General: Well developed, well nourished, in no acute distress. Head: Normocephalic, atraumatic, eyes without discharge, sclera non-icteric, Bilateral auditory canals clear, TM's are without perforation, pearly grey and translucent with reflective cone of light bilaterally. Oral cavity moist, posterior pharynx without exudate, erythema, peritonsillar abscess, or post nasal  drip. nasal passages swelling   Neck: Supple. No thyromegaly. Full ROM. No lymphadenopathy. Lungs: Clear bilaterally to auscultation without wheezes, rales, or rhonchi. Breathing is unlabored. Heart: RRR with S1 S2. No murmurs, rubs, or gallops appreciated. Msk:  Strength and tone normal for age. Extremities/Skin: Warm and dry. No clubbing or cyanosis. No edema. No rashes or suspicious lesions. Neuro: Alert and oriented X 3. Moves all extremities spontaneously. Gait is normal. CNII-XII grossly in tact. Psych:  Responds to questions appropriately with a normal affect.   Labs:  ASSESSMENT AND PLAN:  55 y.o. year old female with .kldiag This chart was scribed in my presence and reviewed by me personally.    ICD-9-CM ICD-10-CM   1. Sinus congestion 478.19 R09.81 methylPREDNISolone acetate (DEPO-MEDROL) injection 80 mg     Signed, [name], MD 09/13/2015 3:01 PM

## 2015-09-13 NOTE — Patient Instructions (Signed)
Let me know if you aren't not feeling better in 2-3 days

## 2015-09-14 DIAGNOSIS — F4310 Post-traumatic stress disorder, unspecified: Secondary | ICD-10-CM | POA: Diagnosis not present

## 2015-09-15 ENCOUNTER — Ambulatory Visit: Payer: Medicare Other

## 2015-09-16 DIAGNOSIS — M9903 Segmental and somatic dysfunction of lumbar region: Secondary | ICD-10-CM | POA: Diagnosis not present

## 2015-09-16 DIAGNOSIS — M5414 Radiculopathy, thoracic region: Secondary | ICD-10-CM | POA: Diagnosis not present

## 2015-09-16 DIAGNOSIS — M9901 Segmental and somatic dysfunction of cervical region: Secondary | ICD-10-CM | POA: Diagnosis not present

## 2015-09-16 DIAGNOSIS — M9902 Segmental and somatic dysfunction of thoracic region: Secondary | ICD-10-CM | POA: Diagnosis not present

## 2015-09-16 DIAGNOSIS — M5417 Radiculopathy, lumbosacral region: Secondary | ICD-10-CM | POA: Diagnosis not present

## 2015-09-16 DIAGNOSIS — M530 Cervicocranial syndrome: Secondary | ICD-10-CM | POA: Diagnosis not present

## 2015-09-19 DIAGNOSIS — F4310 Post-traumatic stress disorder, unspecified: Secondary | ICD-10-CM | POA: Diagnosis not present

## 2015-09-19 DIAGNOSIS — M9903 Segmental and somatic dysfunction of lumbar region: Secondary | ICD-10-CM | POA: Diagnosis not present

## 2015-09-19 DIAGNOSIS — M9901 Segmental and somatic dysfunction of cervical region: Secondary | ICD-10-CM | POA: Diagnosis not present

## 2015-09-19 DIAGNOSIS — M5414 Radiculopathy, thoracic region: Secondary | ICD-10-CM | POA: Diagnosis not present

## 2015-09-19 DIAGNOSIS — M9902 Segmental and somatic dysfunction of thoracic region: Secondary | ICD-10-CM | POA: Diagnosis not present

## 2015-09-19 DIAGNOSIS — M5417 Radiculopathy, lumbosacral region: Secondary | ICD-10-CM | POA: Diagnosis not present

## 2015-09-19 DIAGNOSIS — M530 Cervicocranial syndrome: Secondary | ICD-10-CM | POA: Diagnosis not present

## 2015-09-21 ENCOUNTER — Ambulatory Visit
Admission: RE | Admit: 2015-09-21 | Discharge: 2015-09-21 | Disposition: A | Discharge status: Home / Self Care | Payer: Medicare Other | Source: Ambulatory Visit | Attending: Internal Medicine | Admitting: Internal Medicine

## 2015-09-21 ENCOUNTER — Other Ambulatory Visit (HOSPITAL_COMMUNITY)
Admission: RE | Admit: 2015-09-21 | Discharge: 2015-09-21 | Disposition: A | Discharge status: Home / Self Care | Payer: Medicare Other | Source: Ambulatory Visit | Attending: Radiology | Admitting: Radiology

## 2015-09-21 DIAGNOSIS — E042 Nontoxic multinodular goiter: Secondary | ICD-10-CM | POA: Insufficient documentation

## 2015-09-21 DIAGNOSIS — E041 Nontoxic single thyroid nodule: Secondary | ICD-10-CM | POA: Diagnosis not present

## 2015-09-22 DIAGNOSIS — M9902 Segmental and somatic dysfunction of thoracic region: Secondary | ICD-10-CM | POA: Diagnosis not present

## 2015-09-22 DIAGNOSIS — M5414 Radiculopathy, thoracic region: Secondary | ICD-10-CM | POA: Diagnosis not present

## 2015-09-22 DIAGNOSIS — M9903 Segmental and somatic dysfunction of lumbar region: Secondary | ICD-10-CM | POA: Diagnosis not present

## 2015-09-22 DIAGNOSIS — M530 Cervicocranial syndrome: Secondary | ICD-10-CM | POA: Diagnosis not present

## 2015-09-22 DIAGNOSIS — M9901 Segmental and somatic dysfunction of cervical region: Secondary | ICD-10-CM | POA: Diagnosis not present

## 2015-09-22 DIAGNOSIS — M5417 Radiculopathy, lumbosacral region: Secondary | ICD-10-CM | POA: Diagnosis not present

## 2015-09-26 DIAGNOSIS — M9902 Segmental and somatic dysfunction of thoracic region: Secondary | ICD-10-CM | POA: Diagnosis not present

## 2015-09-26 DIAGNOSIS — M530 Cervicocranial syndrome: Secondary | ICD-10-CM | POA: Diagnosis not present

## 2015-09-26 DIAGNOSIS — M5417 Radiculopathy, lumbosacral region: Secondary | ICD-10-CM | POA: Diagnosis not present

## 2015-09-26 DIAGNOSIS — F341 Dysthymic disorder: Secondary | ICD-10-CM | POA: Diagnosis not present

## 2015-09-26 DIAGNOSIS — M5414 Radiculopathy, thoracic region: Secondary | ICD-10-CM | POA: Diagnosis not present

## 2015-09-26 DIAGNOSIS — F4310 Post-traumatic stress disorder, unspecified: Secondary | ICD-10-CM | POA: Diagnosis not present

## 2015-09-26 DIAGNOSIS — M9901 Segmental and somatic dysfunction of cervical region: Secondary | ICD-10-CM | POA: Diagnosis not present

## 2015-09-26 DIAGNOSIS — M9903 Segmental and somatic dysfunction of lumbar region: Secondary | ICD-10-CM | POA: Diagnosis not present

## 2015-09-28 DIAGNOSIS — F341 Dysthymic disorder: Secondary | ICD-10-CM | POA: Diagnosis not present

## 2015-09-28 DIAGNOSIS — F4310 Post-traumatic stress disorder, unspecified: Secondary | ICD-10-CM | POA: Diagnosis not present

## 2015-09-29 DIAGNOSIS — M9901 Segmental and somatic dysfunction of cervical region: Secondary | ICD-10-CM | POA: Diagnosis not present

## 2015-09-29 DIAGNOSIS — M9903 Segmental and somatic dysfunction of lumbar region: Secondary | ICD-10-CM | POA: Diagnosis not present

## 2015-09-29 DIAGNOSIS — M5414 Radiculopathy, thoracic region: Secondary | ICD-10-CM | POA: Diagnosis not present

## 2015-09-29 DIAGNOSIS — M9902 Segmental and somatic dysfunction of thoracic region: Secondary | ICD-10-CM | POA: Diagnosis not present

## 2015-09-29 DIAGNOSIS — M5417 Radiculopathy, lumbosacral region: Secondary | ICD-10-CM | POA: Diagnosis not present

## 2015-09-29 DIAGNOSIS — M530 Cervicocranial syndrome: Secondary | ICD-10-CM | POA: Diagnosis not present

## 2015-09-30 DIAGNOSIS — F4310 Post-traumatic stress disorder, unspecified: Secondary | ICD-10-CM | POA: Diagnosis not present

## 2015-10-03 DIAGNOSIS — M5414 Radiculopathy, thoracic region: Secondary | ICD-10-CM | POA: Diagnosis not present

## 2015-10-03 DIAGNOSIS — F4310 Post-traumatic stress disorder, unspecified: Secondary | ICD-10-CM | POA: Diagnosis not present

## 2015-10-03 DIAGNOSIS — F341 Dysthymic disorder: Secondary | ICD-10-CM | POA: Diagnosis not present

## 2015-10-03 DIAGNOSIS — M5417 Radiculopathy, lumbosacral region: Secondary | ICD-10-CM | POA: Diagnosis not present

## 2015-10-03 DIAGNOSIS — M9903 Segmental and somatic dysfunction of lumbar region: Secondary | ICD-10-CM | POA: Diagnosis not present

## 2015-10-03 DIAGNOSIS — M530 Cervicocranial syndrome: Secondary | ICD-10-CM | POA: Diagnosis not present

## 2015-10-03 DIAGNOSIS — M9901 Segmental and somatic dysfunction of cervical region: Secondary | ICD-10-CM | POA: Diagnosis not present

## 2015-10-03 DIAGNOSIS — M9902 Segmental and somatic dysfunction of thoracic region: Secondary | ICD-10-CM | POA: Diagnosis not present

## 2015-10-04 DIAGNOSIS — F4310 Post-traumatic stress disorder, unspecified: Secondary | ICD-10-CM | POA: Diagnosis not present

## 2015-10-05 ENCOUNTER — Ambulatory Visit (INDEPENDENT_AMBULATORY_CARE_PROVIDER_SITE_OTHER): Payer: Medicare Other | Admitting: Family Medicine

## 2015-10-05 VITALS — BP 112/70 | HR 90 | Temp 98.5°F | Resp 16 | Ht 62.5 in | Wt 183.6 lb

## 2015-10-05 DIAGNOSIS — E559 Vitamin D deficiency, unspecified: Secondary | ICD-10-CM

## 2015-10-05 DIAGNOSIS — M542 Cervicalgia: Secondary | ICD-10-CM

## 2015-10-05 DIAGNOSIS — Z0000 Encounter for general adult medical examination without abnormal findings: Secondary | ICD-10-CM

## 2015-10-05 DIAGNOSIS — E042 Nontoxic multinodular goiter: Secondary | ICD-10-CM

## 2015-10-05 DIAGNOSIS — E785 Hyperlipidemia, unspecified: Secondary | ICD-10-CM

## 2015-10-05 LAB — COMPLETE METABOLIC PANEL WITH GFR
ALT: 13 U/L (ref 6–29)
AST: 16 U/L (ref 10–35)
Albumin: 4.2 g/dL (ref 3.6–5.1)
Alkaline Phosphatase: 48 U/L (ref 33–130)
BUN: 19 mg/dL (ref 7–25)
CO2: 26 mmol/L (ref 20–31)
Calcium: 10.5 mg/dL — ABNORMAL HIGH (ref 8.6–10.4)
Chloride: 106 mmol/L (ref 98–110)
Creat: 1.12 mg/dL — ABNORMAL HIGH (ref 0.50–1.05)
GFR, Est African American: 65 mL/min (ref >=60)
GFR, Est Non African American: 56 mL/min — ABNORMAL LOW (ref >=60)
Glucose, Bld: 90 mg/dL (ref 65–99)
Potassium: 4.1 mmol/L (ref 3.5–5.3)
Sodium: 140 mmol/L (ref 135–146)
Total Bilirubin: 0.6 mg/dL (ref 0.2–1.2)
Total Protein: 7.4 g/dL (ref 6.1–8.1)

## 2015-10-05 LAB — POCT CBC
Granulocyte percent: 53.2 %G (ref 37–80)
HCT, POC: 37.9 % (ref 37.7–47.9)
Hemoglobin: 13.3 g/dL (ref 12.2–16.2)
Lymph, poc: 3.4 (ref 0.6–3.4)
MCH, POC: 29.8 pg (ref 27–31.2)
MCHC: 35.2 g/dL (ref 31.8–35.4)
MCV: 84.5 fL (ref 80–97)
MID (cbc): 0.6 (ref 0–0.9)
MPV: 8.1 fL (ref 0–99.8)
POC Granulocyte: 4.6 (ref 2–6.9)
POC LYMPH PERCENT: 39.7 %L (ref 10–50)
POC MID %: 7.1 %M (ref 0–12)
Platelet Count, POC: 300 10*3/uL (ref 142–424)
RBC: 4.48 M/uL (ref 4.04–5.48)
RDW, POC: 13.1 %
WBC: 8.6 10*3/uL (ref 4.6–10.2)

## 2015-10-05 LAB — POC MICROSCOPIC URINALYSIS (UMFC): Mucus: ABSENT

## 2015-10-05 LAB — POCT URINALYSIS DIP (MANUAL ENTRY)
Bilirubin, UA: NEGATIVE
Blood, UA: NEGATIVE
Glucose, UA: NEGATIVE
Ketones, POC UA: NEGATIVE
Leukocytes, UA: NEGATIVE
Nitrite, UA: NEGATIVE
Protein Ur, POC: NEGATIVE
Spec Grav, UA: 1.02
Urobilinogen, UA: 0.2
pH, UA: 6

## 2015-10-05 LAB — LIPID PANEL
Cholesterol: 231 mg/dL — ABNORMAL HIGH (ref 125–200)
HDL: 58 mg/dL (ref >=46)
LDL Cholesterol: 138 mg/dL — ABNORMAL HIGH (ref <130)
Total CHOL/HDL Ratio: 4 Ratio (ref <=5.0)
Triglycerides: 175 mg/dL — ABNORMAL HIGH (ref <150)
VLDL: 35 mg/dL — ABNORMAL HIGH (ref <30)

## 2015-10-05 NOTE — Patient Instructions (Addendum)
Advance Directive Advance directives are the legal documents that allow you to make choices about your health care and medical treatment if you cannot speak for yourself. Advance directives are a way for you to communicate your wishes to family, friends, and health care providers. The specified people can then convey your decisions about end-of-life care to avoid confusion if you should become unable to communicate. Ideally, the process of discussing and writing advance directives should happen over time rather than making decisions all at once. Advance directives can be modified as your situation changes, and you can change your mind at any time, even after you have signed the advance directives. Each state has its own laws regarding advance directives. You may want to check with your health care provider, attorney, or state representative about the law in your state. Below are some examples of advance directives. LIVING WILL A living will is a set of instructions documenting your wishes about medical care when you cannot care for yourself. It is used if you become:  Terminally ill.  Incapacitated.  Unable to communicate.  Unable to make decisions. Items to consider in your living will include:  The use or non-use of life-sustaining equipment, such as dialysis machines and breathing machines (ventilators).  A do not resuscitate (DNR) order, which is the instruction not to use cardiopulmonary resuscitation (CPR) if breathing or heartbeat stops.  Tube feeding.  Withholding of food and fluids.  Comfort (palliative) care when the goal becomes comfort rather than a cure.  Organ and tissue donation. A living will does not give instructions about distribution of your money and property if you should pass away. It is advisable to seek the expert advice of a lawyer in drawing up a will regarding your possessions. Decisions about taxes, beneficiaries, and asset distribution will be legally binding.  This process can relieve your family and friends of any burdens surrounding disputes or questions that may come up about the allocation of your assets. DO NOT RESUSCITATE (DNR) A do not resuscitate (DNR) order is a request to not have CPR in the event that your heart stops beating or you stop breathing. Unless given other instructions, a health care provider will try to help any patient whose heart has stopped or who has stopped breathing.  HEALTH CARE PROXY AND DURABLE POWER OF ATTORNEY FOR HEALTH CARE A health care proxy is a person (agent) appointed to make medical decisions for you if you cannot. Generally, people choose someone they know well and trust to represent their preferences when they can no longer do so. You should be sure to ask this person for agreement to act as your agent. An agent may have to exercise judgment in the event of a medical decision for which your wishes are not known. The durable power of attorney for health care is the legal document that names your health care proxy. Once written, it should be:  Signed.  Notarized.  Dated.  Copied.  Witnessed.  Incorporated into your medical record. You may also want to appoint someone to manage your financial affairs if you cannot. This is called a durable power of attorney for finances. It is a separate legal document from the durable power of attorney for health care. You may choose the same person or someone different from your health care proxy to act as your agent in financial matters.   This information is not intended to replace advice given to you by your health care provider. Make sure you discuss any  questions you have with your health care provider.   Document Released: 09/25/2007 Document Revised: 06/23/2013 Document Reviewed: 11/05/2012 Elsevier Interactive Patient Education 2016 [place] Maintenance, Female Adopting a healthy lifestyle and getting preventive care can go a long way to promote  health and wellness. Talk with your health care provider about what schedule of regular examinations is right for you. This is a good chance for you to check in with your provider about disease prevention and staying healthy. In between checkups, there are plenty of things you can do on your own. Experts have done a lot of research about which lifestyle changes and preventive measures are most likely to keep you healthy. Ask your health care provider for more information. WEIGHT AND DIET  Eat a healthy diet  Be sure to include plenty of vegetables, fruits, low-fat dairy products, and lean protein.  Do not eat a lot of foods high in solid fats, added sugars, or salt.  Get regular exercise. This is one of the most important things you can do for your health.  Most adults should exercise for at least 150 minutes each week. The exercise should increase your heart rate and make you sweat (moderate-intensity exercise).  Most adults should also do strengthening exercises at least twice a week. This is in addition to the moderate-intensity exercise.  Maintain a healthy weight  Body mass index (BMI) is a measurement that can be used to identify possible weight problems. It estimates body fat based on height and weight. Your health care provider can help determine your BMI and help you achieve or maintain a healthy weight.  For females 93 years of age and older:   A BMI below 18.5 is considered underweight.  A BMI of 18.5 to 24.9 is normal.  A BMI of 25 to 29.9 is considered overweight.  A BMI of 30 and above is considered obese.  Watch levels of cholesterol and blood lipids  You should start having your blood tested for lipids and cholesterol at 54 years of age, then have this test every 5 years.  You may need to have your cholesterol levels checked more often if:  Your lipid or cholesterol levels are high.  You are older than 54 years of age.  You are at high risk for heart disease.   CANCER SCREENING   Lung Cancer  Lung cancer screening is recommended for adults 52-48 years old who are at high risk for lung cancer because of a history of smoking.  A yearly low-dose CT scan of the lungs is recommended for people who:  Currently smoke.  Have quit within the past 15 years.  Have at least a 30-pack-year history of smoking. A pack year is smoking an average of one pack of cigarettes a day for 1 year.  Yearly screening should continue until it has been 15 years since you quit.  Yearly screening should stop if you develop a health problem that would prevent you from having lung cancer treatment.  Breast Cancer  Practice breast self-awareness. This means understanding how your breasts normally appear and feel.  It also means doing regular breast self-exams. Let your health care provider know about any changes, no matter how small.  If you are in your 20s or 30s, you should have a clinical breast exam (CBE) by a health care provider every 1-3 years as part of a regular health exam.  If you are 32 or older, have a CBE every year. Also consider having a  breast X-ray (mammogram) every year.  If you have a family history of breast cancer, talk to your health care provider about genetic screening.  If you are at high risk for breast cancer, talk to your health care provider about having an MRI and a mammogram every year.  Breast cancer gene (BRCA) assessment is recommended for women who have family members with BRCA-related cancers. BRCA-related cancers include:  Breast.  Ovarian.  Tubal.  Peritoneal cancers.  Results of the assessment will determine the need for genetic counseling and BRCA1 and BRCA2 testing. Cervical Cancer Your health care provider may recommend that you be screened regularly for cancer of the pelvic organs (ovaries, uterus, and vagina). This screening involves a pelvic examination, including checking for microscopic changes to the surface of  your cervix (Pap test). You may be encouraged to have this screening done every 3 years, beginning at age 55.  For women ages 28-65, health care providers may recommend pelvic exams and Pap testing every 3 years, or they may recommend the Pap and pelvic exam, combined with testing for human papilloma virus (HPV), every 5 years. Some types of HPV increase your risk of cervical cancer. Testing for HPV may also be done on women of any age with unclear Pap test results.  Other health care providers may not recommend any screening for nonpregnant women who are considered low risk for pelvic cancer and who do not have symptoms. Ask your health care provider if a screening pelvic exam is right for you.  If you have had past treatment for cervical cancer or a condition that could lead to cancer, you need Pap tests and screening for cancer for at least 20 years after your treatment. If Pap tests have been discontinued, your risk factors (such as having a new sexual partner) need to be reassessed to determine if screening should resume. Some women have medical problems that increase the chance of getting cervical cancer. In these cases, your health care provider may recommend more frequent screening and Pap tests. Colorectal Cancer  This type of cancer can be detected and often prevented.  Routine colorectal cancer screening usually begins at 54 years of age and continues through 54 years of age.  Your health care provider may recommend screening at an earlier age if you have risk factors for colon cancer.  Your health care provider may also recommend using home test kits to check for hidden blood in the stool.  A small camera at the end of a tube can be used to examine your colon directly (sigmoidoscopy or colonoscopy). This is done to check for the earliest forms of colorectal cancer.  Routine screening usually begins at age 61.  Direct examination of the colon should be repeated every 5-10 years  through 54 years of age. However, you may need to be screened more often if early forms of precancerous polyps or small growths are found. Skin Cancer  Check your skin from head to toe regularly.  Tell your health care provider about any new moles or changes in moles, especially if there is a change in a mole's shape or color.  Also tell your health care provider if you have a mole that is larger than the size of a pencil eraser.  Always use sunscreen. Apply sunscreen liberally and repeatedly throughout the day.  Protect yourself by wearing long sleeves, pants, a wide-brimmed hat, and sunglasses whenever you are outside. HEART DISEASE, DIABETES, AND HIGH BLOOD PRESSURE   High blood pressure causes  heart disease and increases the risk of stroke. High blood pressure is more likely to develop in:  People who have blood pressure in the high end of the normal range (130-139/85-89 mm Hg).  People who are overweight or obese.  People who are African American.  If you are 93-51 years of age, have your blood pressure checked every 3-5 years. If you are 50 years of age or older, have your blood pressure checked every year. You should have your blood pressure measured twice--once when you are at a hospital or clinic, and once when you are not at a hospital or clinic. Record the average of the two measurements. To check your blood pressure when you are not at a hospital or clinic, you can use:  An automated blood pressure machine at a pharmacy.  A home blood pressure monitor.  If you are between 60 years and 34 years old, ask your health care provider if you should take aspirin to prevent strokes.  Have regular diabetes screenings. This involves taking a blood sample to check your fasting blood sugar level.  If you are at a normal weight and have a low risk for diabetes, have this test once every three years after 54 years of age.  If you are overweight and have a high risk for diabetes,  consider being tested at a younger age or more often. PREVENTING INFECTION  Hepatitis B  If you have a higher risk for hepatitis B, you should be screened for this virus. You are considered at high risk for hepatitis B if:  You were born in a country where hepatitis B is common. Ask your health care provider which countries are considered high risk.  Your parents were born in a high-risk country, and you have not been immunized against hepatitis B (hepatitis B vaccine).  You have HIV or AIDS.  You use needles to inject street drugs.  You live with someone who has hepatitis B.  You have had sex with someone who has hepatitis B.  You get hemodialysis treatment.  You take certain medicines for conditions, including cancer, organ transplantation, and autoimmune conditions. Hepatitis C  Blood testing is recommended for:  Everyone born from [birth year] through 1964.  Anyone with known risk factors for hepatitis C. Sexually transmitted infections (STIs)  You should be screened for sexually transmitted infections (STIs) including gonorrhea and chlamydia if:  You are sexually active and are younger than 54 years of age.  You are older than 54 years of age and your health care provider tells you that you are at risk for this type of infection.  Your sexual activity has changed since you were last screened and you are at an increased risk for chlamydia or gonorrhea. Ask your health care provider if you are at risk.  If you do not have HIV, but are at risk, it may be recommended that you take a prescription medicine daily to prevent HIV infection. This is called pre-exposure prophylaxis (PrEP). You are considered at risk if:  You are sexually active and do not regularly use condoms or know the HIV status of your partner(s).  You take drugs by injection.  You are sexually active with a partner who has HIV. Talk with your health care provider about whether you are at high risk of being  infected with HIV. If you choose to begin PrEP, you should first be tested for HIV. You should then be tested every 3 months for as long as you are  taking PrEP.  PREGNANCY   If you are premenopausal and you may become pregnant, ask your health care provider about preconception counseling.  If you may become pregnant, take 400 to 800 micrograms (mcg) of folic acid every day.  If you want to prevent pregnancy, talk to your health care provider about birth control (contraception). OSTEOPOROSIS AND MENOPAUSE   Osteoporosis is a disease in which the bones lose minerals and strength with aging. This can result in serious bone fractures. Your risk for osteoporosis can be identified using a bone density scan.  If you are 6 years of age or older, or if you are at risk for osteoporosis and fractures, ask your health care provider if you should be screened.  Ask your health care provider whether you should take a calcium or vitamin D supplement to lower your risk for osteoporosis.  Menopause may have certain physical symptoms and risks.  Hormone replacement therapy may reduce some of these symptoms and risks. Talk to your health care provider about whether hormone replacement therapy is right for you.  HOME CARE INSTRUCTIONS   Schedule regular health, dental, and eye exams.  Stay current with your immunizations.   Do not use any tobacco products including cigarettes, chewing tobacco, or electronic cigarettes.  If you are pregnant, do not drink alcohol.  If you are breastfeeding, limit how much and how often you drink alcohol.  Limit alcohol intake to no more than 1 drink per day for nonpregnant women. One drink equals 12 ounces of beer, 5 ounces of wine, or 1 ounces of hard liquor.  Do not use street drugs.  Do not share needles.  Ask your health care provider for help if you need support or information about quitting drugs.  Tell your health care provider if you often feel  depressed.  Tell your health care provider if you have ever been abused or do not feel safe at home.   This information is not intended to replace advice given to you by your health care provider. Make sure you discuss any questions you have with your health care provider.   Document Released: 01/01/2011 Document Revised: 07/09/2014 Document Reviewed: 05/20/2013 Elsevier Interactive Patient Education [employer].

## 2015-10-05 NOTE — Addendum Note (Signed)
Addended by: [name] on: 10/05/2015 03:24 PM   Modules accepted: Orders, SmartSet

## 2015-10-05 NOTE — Progress Notes (Addendum)
Patient ID: Tamara [name] MRN: [id number], DOB: 1960/11/29, 55 y.o. Date of Encounter: 10/05/2015, 3:23 PM  Primary Physician: [name], MD  Chief Complaint: Physical (CPE)  HPI: 55 y.o. y/o female with history of noted below here for CPE.  Doing well. Has lower neck pain, chiropractor not helping, chronic and associated with stress.  Under care by Dr. Casimiro Needle.  Has seen Dr Belia Heman.  Has reduced blood pressure medicine to 1/2 tab for now.  Pap: due 2019 MMG: due next week Last Td:2014 Review of Systems: Consitutional: No fever, chills, fatigue, night sweats, lymphadenopathy, or weight changes. Eyes: No visual changes, eye redness, or discharge. ENT/Mouth: Ears: No otalgia, tinnitus, hearing loss, discharge. Nose: No congestion, rhinorrhea, sinus pain, or epistaxis. Throat: No sore throat, post nasal drip, or teeth pain. Cardiovascular: No CP, palpitations, diaphoresis, DOE, edema, orthopnea, PND. Respiratory: No cough, hemoptysis, SOB, or wheezing. Gastrointestinal: No anorexia, dysphagia, reflux, pain, nausea, vomiting, hematemesis, diarrhea, constipation, BRBPR, or melena. Breast: No discharge, pain, swelling, or mass. Genitourinary: No dysuria, frequency, urgency, hematuria, incontinence, nocturia, amenorrhea, vaginal discharge, pruritis, burning, abnormal bleeding, or pain. Musculoskeletal: No decreased ROM, myalgias, stiffness, joint swelling, or weakness. Skin: No rash, erythema, lesion changes, pain, warmth, jaundice, or pruritis. Neurological: No headache, dizziness, syncope, seizures, tremors, memory loss, coordination problems, or paresthesias. Psychological: No anxiety, depression, hallucinations, SI/HI. Endocrine: No fatigue, polydipsia, polyphagia, polyuria, or known diabetes. All other systems were reviewed and are otherwise negative.  Past Medical History  Diagnosis Date  . Depression   . HTN (hypertension)   . Hyperlipemia   . Allergy   . GERD  (gastroesophageal reflux disease)   . Neuromuscular disorder ([place])   . Anemia   . Anxiety   . Asthma   . Shock therapy as cause of abnormal reaction of patient or of later complication without mention of misadventure at time of procedure   . History of short term memory loss   . Diverticulitis      Past Surgical History  Procedure Laterality Date  . Cholecystectomy    . Abdominal hysterectomy      partial  . Colonoscopy      Home Meds:  Prior to Admission medications   Medication Sig Start Date End Date Taking? Authorizing Provider  albuterol (PROVENTIL HFA;VENTOLIN HFA) 108 (90 Base) MCG/ACT inhaler Inhale 2 puffs into the lungs every 6 (six) hours as needed for wheezing or shortness of breath. 07/05/15   Leandrew Koyanagi, MD  ALPRAZolam (ALPRAZOLAM XR) 1 MG 24 hr tablet Take 3 tablets (3 mg total) by mouth at bedtime. 09/02/14   [name], MD  ALPRAZolam Duanne Moron) 1 MG tablet Take 1 tablet (1 mg total) by mouth daily as needed for anxiety. 09/02/14   [name], MD  aspirin 325 MG tablet Take 1 tablet (325 mg total) by mouth daily. 10/22/13   Bernadene Bell, MD  baclofen (LIORESAL) 10 MG tablet Take 1 tablet (10 mg total) by mouth 3 (three) times daily. 07/05/15   Leandrew Koyanagi, MD  Biotin 5000 MCG CAPS Take 1 capsule by mouth daily.    Historical Provider, MD  Chlorophyll 100 MG TABS Take 1 tablet by mouth daily.    Historical Provider, MD  Cholecalciferol (VITAMIN D-3) 1000 UNITS CAPS Take 1 capsule by mouth daily.    Historical Provider, MD  Cholecalciferol (VITAMIN D3) 50000 UNITS CAPS Take 50,000 Units by mouth once a week. 09/24/14   [name], MD  dexlansoprazole (DEXILANT) 60 MG capsule Take  1 capsule (60 mg total) by mouth daily. 09/02/14   [name], MD  diclofenac sodium (VOLTAREN) 1 % GEL Apply 2 g topically daily as needed (for pain). 07/05/15   Leandrew Koyanagi, MD  doxycycline (VIBRA-TABS) 100 MG tablet Take 1 tablet (100 mg total) by mouth 2 (two)  times daily. 07/05/15   Leandrew Koyanagi, MD  fenofibrate (TRICOR) 145 MG tablet Take 1 tablet (145 mg total) by mouth daily. 08/19/15   [name], MD  Nyoka Cowden Tea, Camillia sinensis, (GREEN TEA EXTRACT PO) Take 400 mg by mouth daily.    Historical Provider, MD  losartan (COZAAR) 100 MG tablet Take 1 tablet (100 mg total) by mouth daily. 09/07/15   [name], MD  LYSINE PO Take 1 tablet by mouth as needed. prn    Historical Provider, MD  MAGNESIUM GLYCINATE PLUS PO Take 100 mg by mouth daily.    Historical Provider, MD  OVER THE COUNTER MEDICATION R-Lipoic Acid. Take 1 tablet by mouth daily.    Historical Provider, MD  Oxcarbazepine (TRILEPTAL) 300 MG tablet Take 1 tablet (300 mg total) by mouth 2 (two) times daily. 09/02/14   [name], MD  perphenazine (TRILAFON) 4 MG tablet Take 4 mg by mouth 2 (two) times daily.    Historical Provider, MD  Riboflavin 400 MG CAPS Take 1 capsule by mouth daily.    Historical Provider, MD  Taurine 1000 MG CAPS Take 1 capsule by mouth daily.    Historical Provider, MD  temazepam (RESTORIL) 15 MG capsule Take 1 capsule (15 mg total) by mouth at bedtime. 05/05/15   [name], MD  vitamin C (ASCORBIC ACID) 500 MG tablet Take 500 mg by mouth daily.    Historical Provider, MD    Allergies:  Allergies  Allergen Reactions  . Adhesive [Tape]     This is tape as well as adhesive on bandaids.  . Gabapentin     Unknown reaction  . Ivp Dye [Iodinated Diagnostic Agents] Swelling  . Levaquin [Levofloxacin In D5w] Other (See Comments)    GI upset  . Levaquin [Levofloxacin] Nausea And Vomiting  . Oxycodone   . Propoxyphene Hcl Other (See Comments)    hallucination  . Seroquel [Quetiapine Fumerate]     Unknown reaction  . Statins     Arm pain  . Penicillins Rash  . Sulfonamide Derivatives Rash    Social History   Social History  . Marital Status: Divorced    Spouse Name: N/A  . Number of Children: 3  . Years of Education: college    Occupational History  .      Disabled   Social History Main Topics  . Smoking status: Never Smoker   . Smokeless tobacco: Never Used  . Alcohol Use: No  . Drug Use: No  . Sexual Activity: Yes    Birth Control/ Protection: None   Other Topics Concern  . Not on file   Social History Narrative   Lives with boyfriend. Walks daily for 20 minutes. Education: [employer].    Family History  Problem Relation Age of Onset  . Arrhythmia Father   . Stroke Father   . Hyperlipidemia Father   . Heart disease Father   . Hypertension Father   . Coronary artery disease Maternal Grandfather 40    Died 58  . Heart disease Maternal Grandfather   . Hypertension Maternal Grandfather   . Cancer Mother   . Hyperlipidemia Mother   . Hypertension Mother   .  Hearing loss Paternal Grandmother   . Hearing loss Paternal Grandfather   . Diabetes Paternal Grandfather   . Diabetes Daughter   . Colon cancer Paternal Uncle   . Hyperlipidemia Brother   . Hyperlipidemia Maternal Grandmother     Physical Exam: Blood pressure 112/70, pulse 90, temperature 98.5 F (36.9 C), temperature source Oral, resp. rate 16, height 5' 2.5" (1.588 m), weight 183 lb 9.6 oz (83.28 kg), SpO2 98 %., Body mass index is 33.02 kg/(m^2). Wt Readings from Last 3 Encounters:  10/05/15 183 lb 9.6 oz (83.28 kg)  09/13/15 180 lb 3.2 oz (81.738 kg)  09/07/15 179 lb (81.194 kg)   BP Readings from Last 3 Encounters:  10/05/15 112/70  09/13/15 123/86  09/07/15 130/90   General: Well developed, well nourished, in no acute distress. HEENT: Normocephalic, atraumatic. Conjunctiva pink, sclera non-icteric. Pupils 2 mm constricting to 1 mm, round, regular, and equally reactive to light and accomodation. EOMI. Fundi benign   Internal auditory canal clear. TMs with good cone of light and without pathology. Nasal mucosa pink. Nares are without discharge. No sinus tenderness. Oral mucosa pink. Dentition good. Pharynx without exudate.     Neck: Supple. Trachea midline. No thyromegaly. Full ROM. No lymphadenopathy. Lungs: Clear to auscultation bilaterally without wheezes, rales, or rhonchi. Breathing is of normal effort and unlabored. Cardiovascular: RRR with S1 S2. No murmurs, rubs, or gallops appreciated. Distal pulses 2+ symmetrically. No carotid or abdominal bruits. Abdomen: Soft, non-tender, non-distended with normoactive bowel sounds. No hepatosplenomegaly or masses. No rebound/guarding. No CVA tenderness. Without hernias.  Musculoskeletal: Full range of motion and 5/5 strength throughout. Without swelling, atrophy, tenderness, crepitus, or warmth. Extremities without clubbing, cyanosis, or edema. Calves supple. Skin: Warm and moist without erythema, ecchymosis, wounds, or rash. Neuro: A+Ox3. CN II-XII grossly intact. Moves all extremities spontaneously. Full sensation throughout. Normal gait. DTR 2+ throughout upper and lower extremities. Finger to nose intact. Psych:  Responds to questions appropriately with a normal affect.   Lab Results  Component Value Date   CHOL 242* 01/19/2015   HDL 57 01/19/2015   LDLCALC 161* 01/19/2015   LDLDIRECT 94.2 01/12/2010   TRIG 120 01/19/2015   CHOLHDL 4.2 01/19/2015    Assessment/Plan:  55 y.o. y/o female here for CPE   ICD-9-CM ICD-10-CM   1. Annual physical exam V70.0 Z00.00 POCT CBC     POCT urinalysis dipstick     POCT Microscopic Urinalysis ([facility])     COMPLETE METABOLIC PANEL WITH GFR     Lipid panel     VITAMIN D 25 Hydroxy (Vit-D Deficiency, Fractures)     TSH  2. Vitamin D deficiency 268.9 E55.9 VITAMIN D 25 Hydroxy (Vit-D Deficiency, Fractures)  3. Hyperlipidemia 272.4 99991111 COMPLETE METABOLIC PANEL WITH GFR     Lipid panel  4. Multinodular goiter (nontoxic) 241.1 E04.2 TSH  5. Neck pain 723.1 M54.2 Ambulatory referral to Physical Therapy   -  Signed, [name], MD 10/05/2015 3:23 PM

## 2015-10-05 NOTE — Addendum Note (Signed)
Addended by: Orion Crook on: 10/05/2015 03:43 PM   Modules accepted: Miquel Dunn

## 2015-10-06 DIAGNOSIS — M9903 Segmental and somatic dysfunction of lumbar region: Secondary | ICD-10-CM | POA: Diagnosis not present

## 2015-10-06 DIAGNOSIS — M9901 Segmental and somatic dysfunction of cervical region: Secondary | ICD-10-CM | POA: Diagnosis not present

## 2015-10-06 DIAGNOSIS — M9902 Segmental and somatic dysfunction of thoracic region: Secondary | ICD-10-CM | POA: Diagnosis not present

## 2015-10-06 DIAGNOSIS — M5417 Radiculopathy, lumbosacral region: Secondary | ICD-10-CM | POA: Diagnosis not present

## 2015-10-06 DIAGNOSIS — M5414 Radiculopathy, thoracic region: Secondary | ICD-10-CM | POA: Diagnosis not present

## 2015-10-06 DIAGNOSIS — M530 Cervicocranial syndrome: Secondary | ICD-10-CM | POA: Diagnosis not present

## 2015-10-06 LAB — VITAMIN D 25 HYDROXY (VIT D DEFICIENCY, FRACTURES): Vit D, 25-Hydroxy: 52 ng/mL (ref 30–100)

## 2015-10-06 LAB — TSH: TSH: 0.8 mIU/L

## 2015-10-11 DIAGNOSIS — F4310 Post-traumatic stress disorder, unspecified: Secondary | ICD-10-CM | POA: Diagnosis not present

## 2015-10-12 ENCOUNTER — Ambulatory Visit
Admission: RE | Admit: 2015-10-12 | Discharge: 2015-10-12 | Disposition: A | Discharge status: Home / Self Care | Payer: Medicare Other | Source: Ambulatory Visit

## 2015-10-12 DIAGNOSIS — M5414 Radiculopathy, thoracic region: Secondary | ICD-10-CM | POA: Diagnosis not present

## 2015-10-12 DIAGNOSIS — M530 Cervicocranial syndrome: Secondary | ICD-10-CM | POA: Diagnosis not present

## 2015-10-12 DIAGNOSIS — M9903 Segmental and somatic dysfunction of lumbar region: Secondary | ICD-10-CM | POA: Diagnosis not present

## 2015-10-12 DIAGNOSIS — M9902 Segmental and somatic dysfunction of thoracic region: Secondary | ICD-10-CM | POA: Diagnosis not present

## 2015-10-12 DIAGNOSIS — Z1231 Encounter for screening mammogram for malignant neoplasm of breast: Secondary | ICD-10-CM

## 2015-10-12 DIAGNOSIS — M5417 Radiculopathy, lumbosacral region: Secondary | ICD-10-CM | POA: Diagnosis not present

## 2015-10-12 DIAGNOSIS — M9901 Segmental and somatic dysfunction of cervical region: Secondary | ICD-10-CM | POA: Diagnosis not present

## 2015-10-13 DIAGNOSIS — M545 Low back pain: Secondary | ICD-10-CM | POA: Diagnosis not present

## 2015-10-13 DIAGNOSIS — M542 Cervicalgia: Secondary | ICD-10-CM | POA: Diagnosis not present

## 2015-10-14 DIAGNOSIS — F4310 Post-traumatic stress disorder, unspecified: Secondary | ICD-10-CM | POA: Diagnosis not present

## 2015-10-18 DIAGNOSIS — F4310 Post-traumatic stress disorder, unspecified: Secondary | ICD-10-CM | POA: Diagnosis not present

## 2015-10-18 DIAGNOSIS — M545 Low back pain: Secondary | ICD-10-CM | POA: Diagnosis not present

## 2015-10-18 DIAGNOSIS — M542 Cervicalgia: Secondary | ICD-10-CM | POA: Diagnosis not present

## 2015-10-20 DIAGNOSIS — F4310 Post-traumatic stress disorder, unspecified: Secondary | ICD-10-CM | POA: Diagnosis not present

## 2015-10-20 DIAGNOSIS — M545 Low back pain: Secondary | ICD-10-CM | POA: Diagnosis not present

## 2015-10-20 DIAGNOSIS — M542 Cervicalgia: Secondary | ICD-10-CM | POA: Diagnosis not present

## 2015-10-21 DIAGNOSIS — M5414 Radiculopathy, thoracic region: Secondary | ICD-10-CM | POA: Diagnosis not present

## 2015-10-21 DIAGNOSIS — M9901 Segmental and somatic dysfunction of cervical region: Secondary | ICD-10-CM | POA: Diagnosis not present

## 2015-10-21 DIAGNOSIS — M9903 Segmental and somatic dysfunction of lumbar region: Secondary | ICD-10-CM | POA: Diagnosis not present

## 2015-10-21 DIAGNOSIS — M5417 Radiculopathy, lumbosacral region: Secondary | ICD-10-CM | POA: Diagnosis not present

## 2015-10-21 DIAGNOSIS — M9902 Segmental and somatic dysfunction of thoracic region: Secondary | ICD-10-CM | POA: Diagnosis not present

## 2015-10-21 DIAGNOSIS — M530 Cervicocranial syndrome: Secondary | ICD-10-CM | POA: Diagnosis not present

## 2015-10-24 DIAGNOSIS — F4310 Post-traumatic stress disorder, unspecified: Secondary | ICD-10-CM | POA: Diagnosis not present

## 2015-10-24 DIAGNOSIS — F341 Dysthymic disorder: Secondary | ICD-10-CM | POA: Diagnosis not present

## 2015-10-25 DIAGNOSIS — F4310 Post-traumatic stress disorder, unspecified: Secondary | ICD-10-CM | POA: Diagnosis not present

## 2015-10-25 DIAGNOSIS — M545 Low back pain: Secondary | ICD-10-CM | POA: Diagnosis not present

## 2015-10-25 DIAGNOSIS — M542 Cervicalgia: Secondary | ICD-10-CM | POA: Diagnosis not present

## 2015-10-27 DIAGNOSIS — M545 Low back pain: Secondary | ICD-10-CM | POA: Diagnosis not present

## 2015-10-27 DIAGNOSIS — F4310 Post-traumatic stress disorder, unspecified: Secondary | ICD-10-CM | POA: Diagnosis not present

## 2015-10-27 DIAGNOSIS — M542 Cervicalgia: Secondary | ICD-10-CM | POA: Diagnosis not present

## 2015-10-31 ENCOUNTER — Other Ambulatory Visit: Payer: Self-pay

## 2015-10-31 DIAGNOSIS — F4310 Post-traumatic stress disorder, unspecified: Secondary | ICD-10-CM | POA: Diagnosis not present

## 2015-10-31 NOTE — Telephone Encounter (Signed)
Pt left voicemail asking to switch medication to Gemfibrozil 600mg .because the fenofibrate is 60 dollars. Can we switch?

## 2015-11-01 MED ORDER — GEMFIBROZIL 600 MG PO TABS: 600.0000 mg | ORAL_TABLET | Freq: Two times a day (BID) | ORAL | Status: DC

## 2015-11-03 NOTE — Telephone Encounter (Signed)
Notified pt done, and advised we should re-check her lipids at next check up so she should come in fasting. Pt agreed.

## 2015-11-08 DIAGNOSIS — F329 Major depressive disorder, single episode, unspecified: Secondary | ICD-10-CM | POA: Diagnosis not present

## 2015-11-10 DIAGNOSIS — F4310 Post-traumatic stress disorder, unspecified: Secondary | ICD-10-CM | POA: Diagnosis not present

## 2015-11-11 DIAGNOSIS — M542 Cervicalgia: Secondary | ICD-10-CM | POA: Diagnosis not present

## 2015-11-11 DIAGNOSIS — M545 Low back pain: Secondary | ICD-10-CM | POA: Diagnosis not present

## 2015-11-15 DIAGNOSIS — F4310 Post-traumatic stress disorder, unspecified: Secondary | ICD-10-CM | POA: Diagnosis not present

## 2015-11-16 ENCOUNTER — Ambulatory Visit (INDEPENDENT_AMBULATORY_CARE_PROVIDER_SITE_OTHER): Payer: Medicare Other | Admitting: Family Medicine

## 2015-11-16 VITALS — BP 106/74 | HR 101 | Temp 98.0°F | Resp 16 | Ht 62.5 in | Wt 188.0 lb

## 2015-11-16 DIAGNOSIS — R21 Rash and other nonspecific skin eruption: Secondary | ICD-10-CM

## 2015-11-16 NOTE — Patient Instructions (Addendum)
This rash could be from an insect (not a tick) or some irritating piece of clothing.  It is not shingles.

## 2015-11-16 NOTE — Progress Notes (Signed)
55 yo woman with chronic LBP who is seeing Dr. Belia Heman for PT on her back.  She has a single papule on left flank for the past week and wants to make sure she is not getting shingles.   Objective:  BP 106/74 mmHg  Pulse 101  Temp(Src) 98 F (36.7 C)  Resp 16  Ht 5' 2.5" (1.588 m)  Wt 188 lb (85.276 kg)  BMI 33.82 kg/m2  Single 1 mm red papule on left papule, nontender.  No vesicles  Assessment:  Unimportant papule of uncertain etiology which should resolve without treatment  [name], MD

## 2015-11-17 DIAGNOSIS — M545 Low back pain: Secondary | ICD-10-CM | POA: Diagnosis not present

## 2015-11-17 DIAGNOSIS — M542 Cervicalgia: Secondary | ICD-10-CM | POA: Diagnosis not present

## 2015-11-17 DIAGNOSIS — F4310 Post-traumatic stress disorder, unspecified: Secondary | ICD-10-CM | POA: Diagnosis not present

## 2015-11-22 DIAGNOSIS — F4310 Post-traumatic stress disorder, unspecified: Secondary | ICD-10-CM | POA: Diagnosis not present

## 2015-11-22 DIAGNOSIS — M545 Low back pain: Secondary | ICD-10-CM | POA: Diagnosis not present

## 2015-11-22 DIAGNOSIS — M542 Cervicalgia: Secondary | ICD-10-CM | POA: Diagnosis not present

## 2015-11-23 DIAGNOSIS — M9903 Segmental and somatic dysfunction of lumbar region: Secondary | ICD-10-CM | POA: Diagnosis not present

## 2015-11-23 DIAGNOSIS — M5414 Radiculopathy, thoracic region: Secondary | ICD-10-CM | POA: Diagnosis not present

## 2015-11-23 DIAGNOSIS — M9902 Segmental and somatic dysfunction of thoracic region: Secondary | ICD-10-CM | POA: Diagnosis not present

## 2015-11-23 DIAGNOSIS — M5417 Radiculopathy, lumbosacral region: Secondary | ICD-10-CM | POA: Diagnosis not present

## 2015-11-23 DIAGNOSIS — M530 Cervicocranial syndrome: Secondary | ICD-10-CM | POA: Diagnosis not present

## 2015-11-23 DIAGNOSIS — M9901 Segmental and somatic dysfunction of cervical region: Secondary | ICD-10-CM | POA: Diagnosis not present

## 2015-11-24 DIAGNOSIS — M545 Low back pain: Secondary | ICD-10-CM | POA: Diagnosis not present

## 2015-11-24 DIAGNOSIS — F4310 Post-traumatic stress disorder, unspecified: Secondary | ICD-10-CM | POA: Diagnosis not present

## 2015-11-24 DIAGNOSIS — M542 Cervicalgia: Secondary | ICD-10-CM | POA: Diagnosis not present

## 2015-11-25 DIAGNOSIS — F329 Major depressive disorder, single episode, unspecified: Secondary | ICD-10-CM | POA: Diagnosis not present

## 2015-11-29 DIAGNOSIS — M542 Cervicalgia: Secondary | ICD-10-CM | POA: Diagnosis not present

## 2015-11-29 DIAGNOSIS — M545 Low back pain: Secondary | ICD-10-CM | POA: Diagnosis not present

## 2015-11-29 DIAGNOSIS — F4310 Post-traumatic stress disorder, unspecified: Secondary | ICD-10-CM | POA: Diagnosis not present

## 2015-11-30 DIAGNOSIS — M9901 Segmental and somatic dysfunction of cervical region: Secondary | ICD-10-CM | POA: Diagnosis not present

## 2015-11-30 DIAGNOSIS — M5417 Radiculopathy, lumbosacral region: Secondary | ICD-10-CM | POA: Diagnosis not present

## 2015-11-30 DIAGNOSIS — M530 Cervicocranial syndrome: Secondary | ICD-10-CM | POA: Diagnosis not present

## 2015-11-30 DIAGNOSIS — M9903 Segmental and somatic dysfunction of lumbar region: Secondary | ICD-10-CM | POA: Diagnosis not present

## 2015-11-30 DIAGNOSIS — M9902 Segmental and somatic dysfunction of thoracic region: Secondary | ICD-10-CM | POA: Diagnosis not present

## 2015-11-30 DIAGNOSIS — M5414 Radiculopathy, thoracic region: Secondary | ICD-10-CM | POA: Diagnosis not present

## 2015-12-01 DIAGNOSIS — M542 Cervicalgia: Secondary | ICD-10-CM | POA: Diagnosis not present

## 2015-12-01 DIAGNOSIS — M545 Low back pain: Secondary | ICD-10-CM | POA: Diagnosis not present

## 2015-12-01 DIAGNOSIS — F4310 Post-traumatic stress disorder, unspecified: Secondary | ICD-10-CM | POA: Diagnosis not present

## 2015-12-06 DIAGNOSIS — F4310 Post-traumatic stress disorder, unspecified: Secondary | ICD-10-CM | POA: Diagnosis not present

## 2015-12-08 DIAGNOSIS — M545 Low back pain: Secondary | ICD-10-CM | POA: Diagnosis not present

## 2015-12-08 DIAGNOSIS — F4310 Post-traumatic stress disorder, unspecified: Secondary | ICD-10-CM | POA: Diagnosis not present

## 2015-12-08 DIAGNOSIS — M542 Cervicalgia: Secondary | ICD-10-CM | POA: Diagnosis not present

## 2016-01-02 DIAGNOSIS — M542 Cervicalgia: Secondary | ICD-10-CM | POA: Diagnosis not present

## 2016-01-02 DIAGNOSIS — M545 Low back pain: Secondary | ICD-10-CM | POA: Diagnosis not present

## 2016-01-05 DIAGNOSIS — M542 Cervicalgia: Secondary | ICD-10-CM | POA: Diagnosis not present

## 2016-01-05 DIAGNOSIS — M545 Low back pain: Secondary | ICD-10-CM | POA: Diagnosis not present

## 2016-01-09 DIAGNOSIS — M542 Cervicalgia: Secondary | ICD-10-CM | POA: Diagnosis not present

## 2016-01-09 DIAGNOSIS — M545 Low back pain: Secondary | ICD-10-CM | POA: Diagnosis not present

## 2016-01-11 DIAGNOSIS — F4310 Post-traumatic stress disorder, unspecified: Secondary | ICD-10-CM | POA: Diagnosis not present

## 2016-01-12 DIAGNOSIS — M542 Cervicalgia: Secondary | ICD-10-CM | POA: Diagnosis not present

## 2016-01-12 DIAGNOSIS — M545 Low back pain: Secondary | ICD-10-CM | POA: Diagnosis not present

## 2016-01-17 DIAGNOSIS — F4310 Post-traumatic stress disorder, unspecified: Secondary | ICD-10-CM | POA: Diagnosis not present

## 2016-01-19 DIAGNOSIS — M542 Cervicalgia: Secondary | ICD-10-CM | POA: Diagnosis not present

## 2016-01-19 DIAGNOSIS — M545 Low back pain: Secondary | ICD-10-CM | POA: Diagnosis not present

## 2016-01-19 DIAGNOSIS — F4310 Post-traumatic stress disorder, unspecified: Secondary | ICD-10-CM | POA: Diagnosis not present

## 2016-01-23 DIAGNOSIS — M5417 Radiculopathy, lumbosacral region: Secondary | ICD-10-CM | POA: Diagnosis not present

## 2016-01-23 DIAGNOSIS — M9901 Segmental and somatic dysfunction of cervical region: Secondary | ICD-10-CM | POA: Diagnosis not present

## 2016-01-23 DIAGNOSIS — M530 Cervicocranial syndrome: Secondary | ICD-10-CM | POA: Diagnosis not present

## 2016-01-23 DIAGNOSIS — M9902 Segmental and somatic dysfunction of thoracic region: Secondary | ICD-10-CM | POA: Diagnosis not present

## 2016-01-23 DIAGNOSIS — M9903 Segmental and somatic dysfunction of lumbar region: Secondary | ICD-10-CM | POA: Diagnosis not present

## 2016-01-23 DIAGNOSIS — M5414 Radiculopathy, thoracic region: Secondary | ICD-10-CM | POA: Diagnosis not present

## 2016-01-24 DIAGNOSIS — F4310 Post-traumatic stress disorder, unspecified: Secondary | ICD-10-CM | POA: Diagnosis not present

## 2016-01-26 DIAGNOSIS — F4310 Post-traumatic stress disorder, unspecified: Secondary | ICD-10-CM | POA: Diagnosis not present

## 2016-01-26 DIAGNOSIS — M542 Cervicalgia: Secondary | ICD-10-CM | POA: Diagnosis not present

## 2016-01-26 DIAGNOSIS — M545 Low back pain: Secondary | ICD-10-CM | POA: Diagnosis not present

## 2016-01-31 DIAGNOSIS — F4310 Post-traumatic stress disorder, unspecified: Secondary | ICD-10-CM | POA: Diagnosis not present

## 2016-02-02 DIAGNOSIS — F4310 Post-traumatic stress disorder, unspecified: Secondary | ICD-10-CM | POA: Diagnosis not present

## 2016-02-07 DIAGNOSIS — F4310 Post-traumatic stress disorder, unspecified: Secondary | ICD-10-CM | POA: Diagnosis not present

## 2016-02-08 DIAGNOSIS — F329 Major depressive disorder, single episode, unspecified: Secondary | ICD-10-CM | POA: Diagnosis not present

## 2016-02-08 DIAGNOSIS — M9901 Segmental and somatic dysfunction of cervical region: Secondary | ICD-10-CM | POA: Diagnosis not present

## 2016-02-08 DIAGNOSIS — M5032 Other cervical disc degeneration, mid-cervical region, unspecified level: Secondary | ICD-10-CM | POA: Diagnosis not present

## 2016-02-08 DIAGNOSIS — M5417 Radiculopathy, lumbosacral region: Secondary | ICD-10-CM | POA: Diagnosis not present

## 2016-02-08 DIAGNOSIS — M9902 Segmental and somatic dysfunction of thoracic region: Secondary | ICD-10-CM | POA: Diagnosis not present

## 2016-02-08 DIAGNOSIS — M9903 Segmental and somatic dysfunction of lumbar region: Secondary | ICD-10-CM | POA: Diagnosis not present

## 2016-02-08 DIAGNOSIS — M5415 Radiculopathy, thoracolumbar region: Secondary | ICD-10-CM | POA: Diagnosis not present

## 2016-02-09 DIAGNOSIS — F4310 Post-traumatic stress disorder, unspecified: Secondary | ICD-10-CM | POA: Diagnosis not present

## 2016-02-10 DIAGNOSIS — M5417 Radiculopathy, lumbosacral region: Secondary | ICD-10-CM | POA: Diagnosis not present

## 2016-02-10 DIAGNOSIS — M5032 Other cervical disc degeneration, mid-cervical region, unspecified level: Secondary | ICD-10-CM | POA: Diagnosis not present

## 2016-02-10 DIAGNOSIS — M5415 Radiculopathy, thoracolumbar region: Secondary | ICD-10-CM | POA: Diagnosis not present

## 2016-02-10 DIAGNOSIS — M9902 Segmental and somatic dysfunction of thoracic region: Secondary | ICD-10-CM | POA: Diagnosis not present

## 2016-02-10 DIAGNOSIS — M9901 Segmental and somatic dysfunction of cervical region: Secondary | ICD-10-CM | POA: Diagnosis not present

## 2016-02-10 DIAGNOSIS — M9903 Segmental and somatic dysfunction of lumbar region: Secondary | ICD-10-CM | POA: Diagnosis not present

## 2016-02-13 DIAGNOSIS — M9903 Segmental and somatic dysfunction of lumbar region: Secondary | ICD-10-CM | POA: Diagnosis not present

## 2016-02-13 DIAGNOSIS — F4310 Post-traumatic stress disorder, unspecified: Secondary | ICD-10-CM | POA: Diagnosis not present

## 2016-02-13 DIAGNOSIS — M5415 Radiculopathy, thoracolumbar region: Secondary | ICD-10-CM | POA: Diagnosis not present

## 2016-02-13 DIAGNOSIS — M9902 Segmental and somatic dysfunction of thoracic region: Secondary | ICD-10-CM | POA: Diagnosis not present

## 2016-02-13 DIAGNOSIS — M9901 Segmental and somatic dysfunction of cervical region: Secondary | ICD-10-CM | POA: Diagnosis not present

## 2016-02-13 DIAGNOSIS — M5032 Other cervical disc degeneration, mid-cervical region, unspecified level: Secondary | ICD-10-CM | POA: Diagnosis not present

## 2016-02-13 DIAGNOSIS — M5417 Radiculopathy, lumbosacral region: Secondary | ICD-10-CM | POA: Diagnosis not present

## 2016-02-14 DIAGNOSIS — F4310 Post-traumatic stress disorder, unspecified: Secondary | ICD-10-CM | POA: Diagnosis not present

## 2016-02-15 ENCOUNTER — Other Ambulatory Visit: Payer: Self-pay

## 2016-02-15 DIAGNOSIS — M9902 Segmental and somatic dysfunction of thoracic region: Secondary | ICD-10-CM | POA: Diagnosis not present

## 2016-02-15 DIAGNOSIS — M9901 Segmental and somatic dysfunction of cervical region: Secondary | ICD-10-CM | POA: Diagnosis not present

## 2016-02-15 DIAGNOSIS — M5415 Radiculopathy, thoracolumbar region: Secondary | ICD-10-CM | POA: Diagnosis not present

## 2016-02-15 DIAGNOSIS — F4310 Post-traumatic stress disorder, unspecified: Secondary | ICD-10-CM | POA: Diagnosis not present

## 2016-02-15 DIAGNOSIS — M5032 Other cervical disc degeneration, mid-cervical region, unspecified level: Secondary | ICD-10-CM | POA: Diagnosis not present

## 2016-02-15 DIAGNOSIS — M5417 Radiculopathy, lumbosacral region: Secondary | ICD-10-CM | POA: Diagnosis not present

## 2016-02-15 DIAGNOSIS — M9903 Segmental and somatic dysfunction of lumbar region: Secondary | ICD-10-CM | POA: Diagnosis not present

## 2016-02-15 DIAGNOSIS — E785 Hyperlipidemia, unspecified: Secondary | ICD-10-CM

## 2016-02-15 MED ORDER — FENOFIBRATE 145 MG PO TABS: 145.0000 mg | ORAL_TABLET | Freq: Every day | ORAL | 0 refills | Status: DC

## 2016-02-16 ENCOUNTER — Ambulatory Visit: Payer: PRIVATE HEALTH INSURANCE | Admitting: Internal Medicine

## 2016-02-16 DIAGNOSIS — F4310 Post-traumatic stress disorder, unspecified: Secondary | ICD-10-CM | POA: Diagnosis not present

## 2016-02-17 DIAGNOSIS — M9902 Segmental and somatic dysfunction of thoracic region: Secondary | ICD-10-CM | POA: Diagnosis not present

## 2016-02-17 DIAGNOSIS — M9901 Segmental and somatic dysfunction of cervical region: Secondary | ICD-10-CM | POA: Diagnosis not present

## 2016-02-17 DIAGNOSIS — M5032 Other cervical disc degeneration, mid-cervical region, unspecified level: Secondary | ICD-10-CM | POA: Diagnosis not present

## 2016-02-17 DIAGNOSIS — M5417 Radiculopathy, lumbosacral region: Secondary | ICD-10-CM | POA: Diagnosis not present

## 2016-02-17 DIAGNOSIS — M9903 Segmental and somatic dysfunction of lumbar region: Secondary | ICD-10-CM | POA: Diagnosis not present

## 2016-02-17 DIAGNOSIS — M5415 Radiculopathy, thoracolumbar region: Secondary | ICD-10-CM | POA: Diagnosis not present

## 2016-02-21 DIAGNOSIS — M9901 Segmental and somatic dysfunction of cervical region: Secondary | ICD-10-CM | POA: Diagnosis not present

## 2016-02-21 DIAGNOSIS — M5417 Radiculopathy, lumbosacral region: Secondary | ICD-10-CM | POA: Diagnosis not present

## 2016-02-21 DIAGNOSIS — M5032 Other cervical disc degeneration, mid-cervical region, unspecified level: Secondary | ICD-10-CM | POA: Diagnosis not present

## 2016-02-21 DIAGNOSIS — M9903 Segmental and somatic dysfunction of lumbar region: Secondary | ICD-10-CM | POA: Diagnosis not present

## 2016-02-21 DIAGNOSIS — M5415 Radiculopathy, thoracolumbar region: Secondary | ICD-10-CM | POA: Diagnosis not present

## 2016-02-21 DIAGNOSIS — M9902 Segmental and somatic dysfunction of thoracic region: Secondary | ICD-10-CM | POA: Diagnosis not present

## 2016-02-22 DIAGNOSIS — F4310 Post-traumatic stress disorder, unspecified: Secondary | ICD-10-CM | POA: Diagnosis not present

## 2016-02-23 DIAGNOSIS — M5032 Other cervical disc degeneration, mid-cervical region, unspecified level: Secondary | ICD-10-CM | POA: Diagnosis not present

## 2016-02-23 DIAGNOSIS — M9902 Segmental and somatic dysfunction of thoracic region: Secondary | ICD-10-CM | POA: Diagnosis not present

## 2016-02-23 DIAGNOSIS — M5417 Radiculopathy, lumbosacral region: Secondary | ICD-10-CM | POA: Diagnosis not present

## 2016-02-23 DIAGNOSIS — M9903 Segmental and somatic dysfunction of lumbar region: Secondary | ICD-10-CM | POA: Diagnosis not present

## 2016-02-23 DIAGNOSIS — M5415 Radiculopathy, thoracolumbar region: Secondary | ICD-10-CM | POA: Diagnosis not present

## 2016-02-23 DIAGNOSIS — M9901 Segmental and somatic dysfunction of cervical region: Secondary | ICD-10-CM | POA: Diagnosis not present

## 2016-02-24 DIAGNOSIS — F4310 Post-traumatic stress disorder, unspecified: Secondary | ICD-10-CM | POA: Diagnosis not present

## 2016-02-27 DIAGNOSIS — M9903 Segmental and somatic dysfunction of lumbar region: Secondary | ICD-10-CM | POA: Diagnosis not present

## 2016-02-27 DIAGNOSIS — M5417 Radiculopathy, lumbosacral region: Secondary | ICD-10-CM | POA: Diagnosis not present

## 2016-02-27 DIAGNOSIS — M9901 Segmental and somatic dysfunction of cervical region: Secondary | ICD-10-CM | POA: Diagnosis not present

## 2016-02-27 DIAGNOSIS — M9902 Segmental and somatic dysfunction of thoracic region: Secondary | ICD-10-CM | POA: Diagnosis not present

## 2016-02-27 DIAGNOSIS — M5032 Other cervical disc degeneration, mid-cervical region, unspecified level: Secondary | ICD-10-CM | POA: Diagnosis not present

## 2016-02-27 DIAGNOSIS — M5415 Radiculopathy, thoracolumbar region: Secondary | ICD-10-CM | POA: Diagnosis not present

## 2016-02-28 DIAGNOSIS — F4310 Post-traumatic stress disorder, unspecified: Secondary | ICD-10-CM | POA: Diagnosis not present

## 2016-03-01 DIAGNOSIS — M9902 Segmental and somatic dysfunction of thoracic region: Secondary | ICD-10-CM | POA: Diagnosis not present

## 2016-03-01 DIAGNOSIS — M9903 Segmental and somatic dysfunction of lumbar region: Secondary | ICD-10-CM | POA: Diagnosis not present

## 2016-03-01 DIAGNOSIS — M5417 Radiculopathy, lumbosacral region: Secondary | ICD-10-CM | POA: Diagnosis not present

## 2016-03-01 DIAGNOSIS — F4310 Post-traumatic stress disorder, unspecified: Secondary | ICD-10-CM | POA: Diagnosis not present

## 2016-03-01 DIAGNOSIS — M5415 Radiculopathy, thoracolumbar region: Secondary | ICD-10-CM | POA: Diagnosis not present

## 2016-03-01 DIAGNOSIS — M5032 Other cervical disc degeneration, mid-cervical region, unspecified level: Secondary | ICD-10-CM | POA: Diagnosis not present

## 2016-03-01 DIAGNOSIS — M9901 Segmental and somatic dysfunction of cervical region: Secondary | ICD-10-CM | POA: Diagnosis not present

## 2016-03-05 DIAGNOSIS — F4310 Post-traumatic stress disorder, unspecified: Secondary | ICD-10-CM | POA: Diagnosis not present

## 2016-03-07 DIAGNOSIS — F4310 Post-traumatic stress disorder, unspecified: Secondary | ICD-10-CM | POA: Diagnosis not present

## 2016-03-07 DIAGNOSIS — M5417 Radiculopathy, lumbosacral region: Secondary | ICD-10-CM | POA: Diagnosis not present

## 2016-03-07 DIAGNOSIS — M9902 Segmental and somatic dysfunction of thoracic region: Secondary | ICD-10-CM | POA: Diagnosis not present

## 2016-03-07 DIAGNOSIS — M5415 Radiculopathy, thoracolumbar region: Secondary | ICD-10-CM | POA: Diagnosis not present

## 2016-03-07 DIAGNOSIS — M5032 Other cervical disc degeneration, mid-cervical region, unspecified level: Secondary | ICD-10-CM | POA: Diagnosis not present

## 2016-03-07 DIAGNOSIS — M9903 Segmental and somatic dysfunction of lumbar region: Secondary | ICD-10-CM | POA: Diagnosis not present

## 2016-03-07 DIAGNOSIS — M9901 Segmental and somatic dysfunction of cervical region: Secondary | ICD-10-CM | POA: Diagnosis not present

## 2016-03-16 DIAGNOSIS — F4310 Post-traumatic stress disorder, unspecified: Secondary | ICD-10-CM | POA: Diagnosis not present

## 2016-03-19 DIAGNOSIS — M5415 Radiculopathy, thoracolumbar region: Secondary | ICD-10-CM | POA: Diagnosis not present

## 2016-03-19 DIAGNOSIS — M9903 Segmental and somatic dysfunction of lumbar region: Secondary | ICD-10-CM | POA: Diagnosis not present

## 2016-03-19 DIAGNOSIS — M5032 Other cervical disc degeneration, mid-cervical region, unspecified level: Secondary | ICD-10-CM | POA: Diagnosis not present

## 2016-03-19 DIAGNOSIS — M9901 Segmental and somatic dysfunction of cervical region: Secondary | ICD-10-CM | POA: Diagnosis not present

## 2016-03-19 DIAGNOSIS — M5417 Radiculopathy, lumbosacral region: Secondary | ICD-10-CM | POA: Diagnosis not present

## 2016-03-19 DIAGNOSIS — F4310 Post-traumatic stress disorder, unspecified: Secondary | ICD-10-CM | POA: Diagnosis not present

## 2016-03-19 DIAGNOSIS — M9902 Segmental and somatic dysfunction of thoracic region: Secondary | ICD-10-CM | POA: Diagnosis not present

## 2016-03-20 DIAGNOSIS — F4310 Post-traumatic stress disorder, unspecified: Secondary | ICD-10-CM | POA: Diagnosis not present

## 2016-03-22 DIAGNOSIS — M5032 Other cervical disc degeneration, mid-cervical region, unspecified level: Secondary | ICD-10-CM | POA: Diagnosis not present

## 2016-03-22 DIAGNOSIS — M9901 Segmental and somatic dysfunction of cervical region: Secondary | ICD-10-CM | POA: Diagnosis not present

## 2016-03-22 DIAGNOSIS — M5417 Radiculopathy, lumbosacral region: Secondary | ICD-10-CM | POA: Diagnosis not present

## 2016-03-22 DIAGNOSIS — M5415 Radiculopathy, thoracolumbar region: Secondary | ICD-10-CM | POA: Diagnosis not present

## 2016-03-22 DIAGNOSIS — M9902 Segmental and somatic dysfunction of thoracic region: Secondary | ICD-10-CM | POA: Diagnosis not present

## 2016-03-22 DIAGNOSIS — M9903 Segmental and somatic dysfunction of lumbar region: Secondary | ICD-10-CM | POA: Diagnosis not present

## 2016-03-26 DIAGNOSIS — F4310 Post-traumatic stress disorder, unspecified: Secondary | ICD-10-CM | POA: Diagnosis not present

## 2016-03-27 DIAGNOSIS — M9903 Segmental and somatic dysfunction of lumbar region: Secondary | ICD-10-CM | POA: Diagnosis not present

## 2016-03-27 DIAGNOSIS — M5415 Radiculopathy, thoracolumbar region: Secondary | ICD-10-CM | POA: Diagnosis not present

## 2016-03-27 DIAGNOSIS — M5417 Radiculopathy, lumbosacral region: Secondary | ICD-10-CM | POA: Diagnosis not present

## 2016-03-27 DIAGNOSIS — M9901 Segmental and somatic dysfunction of cervical region: Secondary | ICD-10-CM | POA: Diagnosis not present

## 2016-03-27 DIAGNOSIS — M5032 Other cervical disc degeneration, mid-cervical region, unspecified level: Secondary | ICD-10-CM | POA: Diagnosis not present

## 2016-03-27 DIAGNOSIS — M9902 Segmental and somatic dysfunction of thoracic region: Secondary | ICD-10-CM | POA: Diagnosis not present

## 2016-03-28 DIAGNOSIS — F4310 Post-traumatic stress disorder, unspecified: Secondary | ICD-10-CM | POA: Diagnosis not present

## 2016-03-30 DIAGNOSIS — F4310 Post-traumatic stress disorder, unspecified: Secondary | ICD-10-CM | POA: Diagnosis not present

## 2016-04-02 DIAGNOSIS — M5415 Radiculopathy, thoracolumbar region: Secondary | ICD-10-CM | POA: Diagnosis not present

## 2016-04-02 DIAGNOSIS — M9903 Segmental and somatic dysfunction of lumbar region: Secondary | ICD-10-CM | POA: Diagnosis not present

## 2016-04-02 DIAGNOSIS — F4310 Post-traumatic stress disorder, unspecified: Secondary | ICD-10-CM | POA: Diagnosis not present

## 2016-04-02 DIAGNOSIS — M9901 Segmental and somatic dysfunction of cervical region: Secondary | ICD-10-CM | POA: Diagnosis not present

## 2016-04-02 DIAGNOSIS — M5032 Other cervical disc degeneration, mid-cervical region, unspecified level: Secondary | ICD-10-CM | POA: Diagnosis not present

## 2016-04-02 DIAGNOSIS — M9902 Segmental and somatic dysfunction of thoracic region: Secondary | ICD-10-CM | POA: Diagnosis not present

## 2016-04-02 DIAGNOSIS — M5417 Radiculopathy, lumbosacral region: Secondary | ICD-10-CM | POA: Diagnosis not present

## 2016-04-05 DIAGNOSIS — M5415 Radiculopathy, thoracolumbar region: Secondary | ICD-10-CM | POA: Diagnosis not present

## 2016-04-05 DIAGNOSIS — M9902 Segmental and somatic dysfunction of thoracic region: Secondary | ICD-10-CM | POA: Diagnosis not present

## 2016-04-05 DIAGNOSIS — M5032 Other cervical disc degeneration, mid-cervical region, unspecified level: Secondary | ICD-10-CM | POA: Diagnosis not present

## 2016-04-05 DIAGNOSIS — M9903 Segmental and somatic dysfunction of lumbar region: Secondary | ICD-10-CM | POA: Diagnosis not present

## 2016-04-05 DIAGNOSIS — M9901 Segmental and somatic dysfunction of cervical region: Secondary | ICD-10-CM | POA: Diagnosis not present

## 2016-04-05 DIAGNOSIS — M5417 Radiculopathy, lumbosacral region: Secondary | ICD-10-CM | POA: Diagnosis not present

## 2016-04-06 DIAGNOSIS — F4310 Post-traumatic stress disorder, unspecified: Secondary | ICD-10-CM | POA: Diagnosis not present

## 2016-04-09 DIAGNOSIS — F4310 Post-traumatic stress disorder, unspecified: Secondary | ICD-10-CM | POA: Diagnosis not present

## 2016-04-10 ENCOUNTER — Ambulatory Visit: Payer: PRIVATE HEALTH INSURANCE | Admitting: Internal Medicine

## 2016-04-11 DIAGNOSIS — F329 Major depressive disorder, single episode, unspecified: Secondary | ICD-10-CM | POA: Diagnosis not present

## 2016-04-16 ENCOUNTER — Ambulatory Visit (INDEPENDENT_AMBULATORY_CARE_PROVIDER_SITE_OTHER): Payer: Medicare Other | Admitting: Internal Medicine

## 2016-04-16 ENCOUNTER — Encounter: Payer: Self-pay | Admitting: Internal Medicine

## 2016-04-16 VITALS — BP 128/90 | HR 92 | Wt 195.0 lb

## 2016-04-16 DIAGNOSIS — Z23 Encounter for immunization: Secondary | ICD-10-CM

## 2016-04-16 DIAGNOSIS — E042 Nontoxic multinodular goiter: Secondary | ICD-10-CM | POA: Diagnosis not present

## 2016-04-16 DIAGNOSIS — F4310 Post-traumatic stress disorder, unspecified: Secondary | ICD-10-CM | POA: Diagnosis not present

## 2016-04-16 NOTE — Patient Instructions (Signed)
Please return in 2 years.  Call me before next appt to order the new thyroid U/S.  Try to schedule an appt with Dr. Janett Billow Copland.

## 2016-04-16 NOTE — Progress Notes (Signed)
Patient ID: Tamara [name], female   DOB: [birth year], 55 y.o.   MRN: [id number]   HPI  Tamara [name] is a 55 y.o.-year-old female, initially referred by her PCP, Dr. Joseph Art, now returning for f/u for MNG.  She previously saw Dr. Loanne Drilling up to 2012 and then Dr. Buddy Duty, then Dr Hartford Poli.  Last visit with me 1 year and 2 mo ago.  Biopsy of the right lower lobe nodule (cystic) (06/11/2012): benign  Thyroid U/S (06/02/2013):   Right thyroid lobe: 63 x 18 x 21 mm. Homogeneous background parenchyma. 9 x 6 x 12 mm cyst in the inferior pole (previously 17 x 18 x 11). Multiple hypoechoic nodules measuring less than 5 mm maximum diameter.  Left thyroid lobe: 58 x 16 x 19 mm. 10 x 7 x 13 mm solid nodule, inferior pole (previously 10 x 6 x 8). Multiple additional hypoechoic/cystic lesions less than 5 mm.  Isthmus: 6.2 mm. No nodules visualized.  Lymphadenopathy: None visualized. IMPRESSION: 1. Multiple small bilateral nodules and cysts. Findings do not meet current consensus criteria for biopsy.    Thyroid U/S (02/17/2015):   Right thyroid lobe: 6.1 x 1.9 x 2.1 cm. 6 mm cyst in the lower pole is not significantly changed. Previously seen cyst measuring 1.2 cm has resolved and is no longer visualized.  Left thyroid lobe: 5.8 x 1.6 x 1.6 cm. Solid lower pole nodule measuring 13 x 7 x 10 mm is stable.  Isthmus Thickness: 5 mm. No nodules visualized.  Lymphadenopathy: None visualized.  IMPRESSION: Stable 13 mm solid left lower pole nodule. The dominant cyst in the right lobe has resolved. No new solid nodules.  Left thyroid nodule biopsy (09/21/2015): Benign  Pt denies feeling nodules in neck, hoarseness, dysphagia/odynophagia, SOB with lying down.  I reviewed pt's thyroid tests: Lab Results  Component Value Date   TSH 0.80 10/05/2015   TSH 0.668 09/02/2014   TSH 0.925 09/10/2013   TSH 0.461 09/10/2012   TSH 0.316 (L) 10/04/2011   TSH 0.132 (L) 08/30/2011   TSH 1.60 02/16/2010   TSH 1.46 01/12/2010   TSH 2.27 11/23/2008   TSH 0.65 09/17/2007   FREET4 1.09 10/04/2011    She was on LT4 in the past - started by her psychiatrist (Dr Amalia Greenhouse).  Pt c/o: - heat intolerance/cold intolerance - tremors - palpitations - anxiety/depression - hyperdefecation/constipation - weight loss - weight gain - dry skin - hair falling - problems with concentration - fatigue  Pt does have a FH of thyroid ds: on her mother's side. + FH of thyroid cancer in her family: maternal aunt. No h/o radiation tx to head or neck.  No seaweed or kelp, no recent contrast studies. No steroid use. No herbal supplements.   HL:  Reviewed Lipid levels: Lab Results  Component Value Date   CHOL 231 (H) 10/05/2015   CHOL 242 (H) 01/19/2015   CHOL 245 (H) 09/21/2014   Lab Results  Component Value Date   HDL 58 10/05/2015   HDL 57 01/19/2015   HDL 50 09/21/2014   Lab Results  Component Value Date   LDLCALC 138 (H) 10/05/2015   LDLCALC 161 (H) 01/19/2015   LDLCALC 135 (H) 09/21/2014   Lab Results  Component Value Date   TRIG 175 (H) 10/05/2015   TRIG 120 01/19/2015   TRIG 301 (H) 09/21/2014   Lab Results  Component Value Date   CHOLHDL 4.0 10/05/2015   CHOLHDL 4.2 01/19/2015   CHOLHDL 4.9 09/21/2014  Lab Results  Component Value Date   LDLDIRECT 94.2 01/12/2010   LDLDIRECT 80.2 11/23/2008   LDLDIRECT 76.1 09/17/2007   Intolerant to statins. Only on supplements now.  ROS: Constitutional: + weight gain, no fatigue, no subjective hyperthermia/hypothermia Eyes: no blurry vision, no xerophthalmia ENT: no sore throat, no nodules palpated in throat, no dysphagia/odynophagia, no hoarseness Cardiovascular: no CP/+ SOB/+ palpitations/no leg swelling Respiratory: no cough/+ SOB Gastrointestinal: no N/V/D/C Musculoskeletal: + muscle/+ joint aches Skin: no rashes Neurological: no tremors/numbness/tingling/dizziness  Psychiatric: + both: depression/anxiety (PTSD) - had ECT  I  reviewed pt's medications, allergies, PMH, social hx, family hx, and changes were documented in the history of present illness. Otherwise, unchanged from my initial visit note. She is titrating off Xanax XR.  Past Medical History:  Diagnosis Date  . Allergy   . Anemia   . Anxiety   . Asthma   . Depression   . Diverticulitis   . GERD (gastroesophageal reflux disease)   . History of short term memory loss   . HTN (hypertension)   . Hyperlipemia   . Neuromuscular disorder ([place])   . Shock therapy as cause of abnormal reaction of patient or of later complication without mention of misadventure at time of procedure    Past Surgical History:  Procedure Laterality Date  . ABDOMINAL HYSTERECTOMY     partial  . CHOLECYSTECTOMY    . COLONOSCOPY     Social History   Social History  . Marital status: Divorced    Spouse name: N/A  . Number of children: 3  . Years of education: college   Occupational History  .  Unemployed    Disabled   Social History Main Topics  . Smoking status: Never Smoker  . Smokeless tobacco: Never Used  . Alcohol use No  . Drug use: No  . Sexual activity: Yes    Birth control/ protection: None   Other Topics Concern  . Not on file   Social History Narrative   Lives with boyfriend. Walks daily for 20 minutes. Education: [employer].   Current Outpatient Prescriptions on File Prior to Visit  Medication Sig Dispense Refill  . albuterol (PROVENTIL HFA;VENTOLIN HFA) 108 (90 Base) MCG/ACT inhaler Inhale 2 puffs into the lungs every 6 (six) hours as needed for wheezing or shortness of breath. 1 Inhaler 3  . ALPRAZolam (ALPRAZOLAM XR) 1 MG 24 hr tablet Take 3 tablets (3 mg total) by mouth at bedtime. 90 tablet 5  . ALPRAZolam (XANAX) 1 MG tablet Take 1 tablet (1 mg total) by mouth daily as needed for anxiety. 30 tablet 3  . aspirin 325 MG tablet Take 1 tablet (325 mg total) by mouth daily. 30 tablet 0  . baclofen (LIORESAL) 10 MG tablet Take 1 tablet (10 mg  total) by mouth 3 (three) times daily. 30 each 11  . Biotin 5000 MCG CAPS Take 1 capsule by mouth daily.    . Chlorophyll 100 MG TABS Take 1 tablet by mouth daily.    . Cholecalciferol (VITAMIN D-3) 1000 UNITS CAPS Take 1 capsule by mouth daily.    . Cholecalciferol (VITAMIN D3) 50000 UNITS CAPS Take 50,000 Units by mouth once a week. 12 capsule 2  . dexlansoprazole (DEXILANT) 60 MG capsule Take 1 capsule (60 mg total) by mouth daily. 90 capsule 3  . diclofenac sodium (VOLTAREN) 1 % GEL Apply 2 g topically daily as needed (for pain). 100 g 3  . doxycycline (VIBRA-TABS) 100 MG tablet Take 1 tablet (100  mg total) by mouth 2 (two) times daily. 180 tablet 3  . fenofibrate (TRICOR) 145 MG tablet Take 1 tablet (145 mg total) by mouth daily. 90 tablet 0  . gemfibrozil (LOPID) 600 MG tablet Take 1 tablet (600 mg total) by mouth 2 (two) times daily before a meal. 90 tablet 3  . Green Tea, Camillia sinensis, (GREEN TEA EXTRACT PO) Take 400 mg by mouth daily.    Marland Kitchen losartan (COZAAR) 100 MG tablet Take 1 tablet (100 mg total) by mouth daily. 90 tablet 3  . LYSINE PO Take 1 tablet by mouth as needed. prn    . MAGNESIUM GLYCINATE PLUS PO Take 100 mg by mouth daily.    Marland Kitchen OVER THE COUNTER MEDICATION R-Lipoic Acid. Take 1 tablet by mouth daily.    . Oxcarbazepine (TRILEPTAL) 300 MG tablet Take 1 tablet (300 mg total) by mouth 2 (two) times daily. 60 tablet 5  . perphenazine (TRILAFON) 4 MG tablet Take 4 mg by mouth 2 (two) times daily.    . Riboflavin 400 MG CAPS Take 1 capsule by mouth daily.    . Taurine 1000 MG CAPS Take 1 capsule by mouth daily.    . temazepam (RESTORIL) 15 MG capsule Take 1 capsule (15 mg total) by mouth at bedtime. (Patient taking differently: Take 30 mg by mouth at bedtime. ) 90 capsule 3  . vitamin C (ASCORBIC ACID) 500 MG tablet Take 500 mg by mouth daily.     No current facility-administered medications on file prior to visit.    Allergies  Allergen Reactions  . Adhesive [Tape]      This is tape as well as adhesive on bandaids.  . Gabapentin     Unknown reaction  . Ivp Dye [Iodinated Diagnostic Agents] Swelling  . Levaquin [Levofloxacin In D5w] Other (See Comments)    GI upset  . Levaquin [Levofloxacin] Nausea And Vomiting  . Oxycodone   . Propoxyphene Hcl Other (See Comments)    hallucination  . Seroquel [Quetiapine Fumerate]     Unknown reaction  . Statins     Arm pain  . Penicillins Rash  . Sulfonamide Derivatives Rash   Family History  Problem Relation Age of Onset  . Arrhythmia Father   . Stroke Father   . Hyperlipidemia Father   . Heart disease Father   . Hypertension Father   . Coronary artery disease Maternal Grandfather 40    Died 58  . Heart disease Maternal Grandfather   . Hypertension Maternal Grandfather   . Cancer Mother   . Hyperlipidemia Mother   . Hypertension Mother   . Hearing loss Paternal Grandmother   . Hearing loss Paternal Grandfather   . Diabetes Paternal Grandfather   . Diabetes Daughter   . Colon cancer Paternal Uncle   . Hyperlipidemia Brother   . Hyperlipidemia Maternal Grandmother    PE: BP 128/90 (BP Location: Left Arm, Patient Position: Standing)   Pulse 92   Wt 195 lb (88.5 kg)   SpO2 98%   BMI 35.10 kg/m  Wt Readings from Last 3 Encounters:  04/16/16 195 lb (88.5 kg)  11/16/15 188 lb (85.3 kg)  10/05/15 183 lb 9.6 oz (83.3 kg)   Constitutional: overweight, in NAD Eyes: PERRLA, EOMI, no exophthalmos ENT: moist mucous membranes, no thyromegaly, no cervical lymphadenopathy Cardiovascular: RRR, No MRG Respiratory: CTA B Gastrointestinal: abdomen soft, NT, ND, BS+ Musculoskeletal: no deformities, strength intact in all 4;  Skin: moist, warm, no rashes Neurological: no tremor with  outstretched hands, DTR normal in all 4  ASSESSMENT: 1. MNG - she wanted a second opinion as she was unhappy with the previous endocrinologist who suggested that she had another U/S in 3 years (after the 2014 ultrasound).  2.  HL - likely FH  PLAN: 1. MNG  - I reviewed the report of her 01/2015 thyroid ultrasound and the 08/2015 left thyroid nodule FNA along with the patient. The left thyroid nodule was small and without concerning features: without calcifications, without internal blood flow, more wide than tall, and well delimited from surrounding tissues. However patient had considerable anxiety about this nodule, since she has a thyroid cancer family history. Therefore, we biopsied the nodule in 08/2015 and the result was benign. The R thyroid cyst was initially aspirated >> benign and it was not visible on the latest ultrasound from 2016.  - We discussed about the fact that she is now at very low risk for thyroid cancer and will repeat another ultrasound in at least 2 years from now.  - We will schedule another appointment for then. She agrees with the plan.  2. HL - reviewed previous Lipid levels - likely FH - Could not tolerate statins >>  Dr Sallyanne Kuster suggested to retry a statin >> refused, [facility] did not approve injectable mab  - decrease LDL from ~177 to 135 with diet and supplements >> then relaxed diet >> LDL increased to 160s >> she then again brought down to the 130s with diet - I suggested Ezetimibe but she refused...  Philemon Kingdom, MD PhD [facility]

## 2016-04-19 DIAGNOSIS — F4310 Post-traumatic stress disorder, unspecified: Secondary | ICD-10-CM | POA: Diagnosis not present

## 2016-04-23 DIAGNOSIS — F4310 Post-traumatic stress disorder, unspecified: Secondary | ICD-10-CM | POA: Diagnosis not present

## 2016-04-25 DIAGNOSIS — F4310 Post-traumatic stress disorder, unspecified: Secondary | ICD-10-CM | POA: Diagnosis not present

## 2016-04-27 DIAGNOSIS — F4310 Post-traumatic stress disorder, unspecified: Secondary | ICD-10-CM | POA: Diagnosis not present

## 2016-04-28 DIAGNOSIS — F4310 Post-traumatic stress disorder, unspecified: Secondary | ICD-10-CM | POA: Diagnosis not present

## 2016-05-21 ENCOUNTER — Telehealth: Payer: Self-pay

## 2016-05-21 DIAGNOSIS — E785 Hyperlipidemia, unspecified: Secondary | ICD-10-CM

## 2016-05-21 NOTE — Telephone Encounter (Signed)
Patient is calling to request a 3 month refill for Fenofibrate through [facility] mail order.  [phone]

## 2016-05-22 DIAGNOSIS — F4310 Post-traumatic stress disorder, unspecified: Secondary | ICD-10-CM | POA: Diagnosis not present

## 2016-05-22 MED ORDER — FENOFIBRATE 145 MG PO TABS: 145.0000 mg | ORAL_TABLET | Freq: Every day | ORAL | 0 refills | Status: DC

## 2016-05-22 NOTE — Telephone Encounter (Signed)
Sent in RF w/note to RTC f/up. Notified pt and she agreed to call back to est care w/new provider.

## 2016-05-28 DIAGNOSIS — F4310 Post-traumatic stress disorder, unspecified: Secondary | ICD-10-CM | POA: Diagnosis not present

## 2016-06-19 DIAGNOSIS — F4310 Post-traumatic stress disorder, unspecified: Secondary | ICD-10-CM | POA: Diagnosis not present

## 2016-06-20 DIAGNOSIS — M9901 Segmental and somatic dysfunction of cervical region: Secondary | ICD-10-CM | POA: Diagnosis not present

## 2016-06-20 DIAGNOSIS — M9903 Segmental and somatic dysfunction of lumbar region: Secondary | ICD-10-CM | POA: Diagnosis not present

## 2016-06-20 DIAGNOSIS — M5032 Other cervical disc degeneration, mid-cervical region, unspecified level: Secondary | ICD-10-CM | POA: Diagnosis not present

## 2016-06-20 DIAGNOSIS — M5417 Radiculopathy, lumbosacral region: Secondary | ICD-10-CM | POA: Diagnosis not present

## 2016-06-20 DIAGNOSIS — M9902 Segmental and somatic dysfunction of thoracic region: Secondary | ICD-10-CM | POA: Diagnosis not present

## 2016-06-20 DIAGNOSIS — M5415 Radiculopathy, thoracolumbar region: Secondary | ICD-10-CM | POA: Diagnosis not present

## 2016-06-21 DIAGNOSIS — F329 Major depressive disorder, single episode, unspecified: Secondary | ICD-10-CM | POA: Diagnosis not present

## 2016-06-22 DIAGNOSIS — F4310 Post-traumatic stress disorder, unspecified: Secondary | ICD-10-CM | POA: Diagnosis not present

## 2016-06-27 DIAGNOSIS — F4310 Post-traumatic stress disorder, unspecified: Secondary | ICD-10-CM | POA: Diagnosis not present

## 2016-07-04 DIAGNOSIS — F4310 Post-traumatic stress disorder, unspecified: Secondary | ICD-10-CM | POA: Diagnosis not present

## 2016-07-06 DIAGNOSIS — F4310 Post-traumatic stress disorder, unspecified: Secondary | ICD-10-CM | POA: Diagnosis not present

## 2016-07-11 DIAGNOSIS — E559 Vitamin D deficiency, unspecified: Secondary | ICD-10-CM | POA: Diagnosis not present

## 2016-07-11 DIAGNOSIS — F339 Major depressive disorder, recurrent, unspecified: Secondary | ICD-10-CM | POA: Diagnosis not present

## 2016-07-11 DIAGNOSIS — I10 Essential (primary) hypertension: Secondary | ICD-10-CM | POA: Diagnosis not present

## 2016-07-11 DIAGNOSIS — G459 Transient cerebral ischemic attack, unspecified: Secondary | ICD-10-CM | POA: Diagnosis not present

## 2016-07-11 DIAGNOSIS — E785 Hyperlipidemia, unspecified: Secondary | ICD-10-CM | POA: Diagnosis not present

## 2016-07-11 DIAGNOSIS — L709 Acne, unspecified: Secondary | ICD-10-CM | POA: Diagnosis not present

## 2016-07-11 DIAGNOSIS — J45909 Unspecified asthma, uncomplicated: Secondary | ICD-10-CM | POA: Diagnosis not present

## 2016-07-11 DIAGNOSIS — K219 Gastro-esophageal reflux disease without esophagitis: Secondary | ICD-10-CM | POA: Diagnosis not present

## 2016-07-11 DIAGNOSIS — M159 Polyosteoarthritis, unspecified: Secondary | ICD-10-CM | POA: Diagnosis not present

## 2016-08-15 DIAGNOSIS — F4310 Post-traumatic stress disorder, unspecified: Secondary | ICD-10-CM | POA: Diagnosis not present

## 2016-08-15 DIAGNOSIS — I119 Hypertensive heart disease without heart failure: Secondary | ICD-10-CM | POA: Diagnosis not present

## 2016-08-15 DIAGNOSIS — J069 Acute upper respiratory infection, unspecified: Secondary | ICD-10-CM | POA: Diagnosis not present

## 2016-08-16 DIAGNOSIS — F4310 Post-traumatic stress disorder, unspecified: Secondary | ICD-10-CM | POA: Diagnosis not present

## 2016-08-20 DIAGNOSIS — R05 Cough: Secondary | ICD-10-CM | POA: Diagnosis not present

## 2016-08-20 DIAGNOSIS — R509 Fever, unspecified: Secondary | ICD-10-CM | POA: Diagnosis not present

## 2016-08-22 DIAGNOSIS — F4310 Post-traumatic stress disorder, unspecified: Secondary | ICD-10-CM | POA: Diagnosis not present

## 2016-08-27 DIAGNOSIS — F4310 Post-traumatic stress disorder, unspecified: Secondary | ICD-10-CM | POA: Diagnosis not present

## 2016-08-29 DIAGNOSIS — F4310 Post-traumatic stress disorder, unspecified: Secondary | ICD-10-CM | POA: Diagnosis not present

## 2016-09-04 DIAGNOSIS — F4310 Post-traumatic stress disorder, unspecified: Secondary | ICD-10-CM | POA: Diagnosis not present

## 2016-09-06 DIAGNOSIS — F4310 Post-traumatic stress disorder, unspecified: Secondary | ICD-10-CM | POA: Diagnosis not present

## 2016-09-11 DIAGNOSIS — F4310 Post-traumatic stress disorder, unspecified: Secondary | ICD-10-CM | POA: Diagnosis not present

## 2016-09-13 DIAGNOSIS — F4310 Post-traumatic stress disorder, unspecified: Secondary | ICD-10-CM | POA: Diagnosis not present

## 2016-09-18 DIAGNOSIS — F4310 Post-traumatic stress disorder, unspecified: Secondary | ICD-10-CM | POA: Diagnosis not present

## 2016-09-20 DIAGNOSIS — F329 Major depressive disorder, single episode, unspecified: Secondary | ICD-10-CM | POA: Diagnosis not present

## 2016-09-21 DIAGNOSIS — F4310 Post-traumatic stress disorder, unspecified: Secondary | ICD-10-CM | POA: Diagnosis not present

## 2016-10-02 DIAGNOSIS — F4310 Post-traumatic stress disorder, unspecified: Secondary | ICD-10-CM | POA: Diagnosis not present

## 2016-10-04 DIAGNOSIS — F4310 Post-traumatic stress disorder, unspecified: Secondary | ICD-10-CM | POA: Diagnosis not present

## 2016-10-10 DIAGNOSIS — F4310 Post-traumatic stress disorder, unspecified: Secondary | ICD-10-CM | POA: Diagnosis not present

## 2016-10-10 DIAGNOSIS — M9903 Segmental and somatic dysfunction of lumbar region: Secondary | ICD-10-CM | POA: Diagnosis not present

## 2016-10-10 DIAGNOSIS — M9901 Segmental and somatic dysfunction of cervical region: Secondary | ICD-10-CM | POA: Diagnosis not present

## 2016-10-10 DIAGNOSIS — M5386 Other specified dorsopathies, lumbar region: Secondary | ICD-10-CM | POA: Diagnosis not present

## 2016-10-10 DIAGNOSIS — M5414 Radiculopathy, thoracic region: Secondary | ICD-10-CM | POA: Diagnosis not present

## 2016-10-10 DIAGNOSIS — M9902 Segmental and somatic dysfunction of thoracic region: Secondary | ICD-10-CM | POA: Diagnosis not present

## 2016-10-10 DIAGNOSIS — M5032 Other cervical disc degeneration, mid-cervical region, unspecified level: Secondary | ICD-10-CM | POA: Diagnosis not present

## 2016-10-12 DIAGNOSIS — F4310 Post-traumatic stress disorder, unspecified: Secondary | ICD-10-CM | POA: Diagnosis not present

## 2016-10-12 DIAGNOSIS — M5414 Radiculopathy, thoracic region: Secondary | ICD-10-CM | POA: Diagnosis not present

## 2016-10-12 DIAGNOSIS — M9901 Segmental and somatic dysfunction of cervical region: Secondary | ICD-10-CM | POA: Diagnosis not present

## 2016-10-12 DIAGNOSIS — M5386 Other specified dorsopathies, lumbar region: Secondary | ICD-10-CM | POA: Diagnosis not present

## 2016-10-12 DIAGNOSIS — M5032 Other cervical disc degeneration, mid-cervical region, unspecified level: Secondary | ICD-10-CM | POA: Diagnosis not present

## 2016-10-12 DIAGNOSIS — M9902 Segmental and somatic dysfunction of thoracic region: Secondary | ICD-10-CM | POA: Diagnosis not present

## 2016-10-12 DIAGNOSIS — M9903 Segmental and somatic dysfunction of lumbar region: Secondary | ICD-10-CM | POA: Diagnosis not present

## 2016-10-17 DIAGNOSIS — M5032 Other cervical disc degeneration, mid-cervical region, unspecified level: Secondary | ICD-10-CM | POA: Diagnosis not present

## 2016-10-17 DIAGNOSIS — M9901 Segmental and somatic dysfunction of cervical region: Secondary | ICD-10-CM | POA: Diagnosis not present

## 2016-10-17 DIAGNOSIS — M9902 Segmental and somatic dysfunction of thoracic region: Secondary | ICD-10-CM | POA: Diagnosis not present

## 2016-10-17 DIAGNOSIS — M9903 Segmental and somatic dysfunction of lumbar region: Secondary | ICD-10-CM | POA: Diagnosis not present

## 2016-10-17 DIAGNOSIS — M5386 Other specified dorsopathies, lumbar region: Secondary | ICD-10-CM | POA: Diagnosis not present

## 2016-10-17 DIAGNOSIS — M5414 Radiculopathy, thoracic region: Secondary | ICD-10-CM | POA: Diagnosis not present

## 2016-10-18 DIAGNOSIS — F4310 Post-traumatic stress disorder, unspecified: Secondary | ICD-10-CM | POA: Diagnosis not present

## 2016-10-19 DIAGNOSIS — M5386 Other specified dorsopathies, lumbar region: Secondary | ICD-10-CM | POA: Diagnosis not present

## 2016-10-19 DIAGNOSIS — M9903 Segmental and somatic dysfunction of lumbar region: Secondary | ICD-10-CM | POA: Diagnosis not present

## 2016-10-19 DIAGNOSIS — M5414 Radiculopathy, thoracic region: Secondary | ICD-10-CM | POA: Diagnosis not present

## 2016-10-19 DIAGNOSIS — F4310 Post-traumatic stress disorder, unspecified: Secondary | ICD-10-CM | POA: Diagnosis not present

## 2016-10-19 DIAGNOSIS — M9902 Segmental and somatic dysfunction of thoracic region: Secondary | ICD-10-CM | POA: Diagnosis not present

## 2016-10-19 DIAGNOSIS — M9901 Segmental and somatic dysfunction of cervical region: Secondary | ICD-10-CM | POA: Diagnosis not present

## 2016-10-19 DIAGNOSIS — M5032 Other cervical disc degeneration, mid-cervical region, unspecified level: Secondary | ICD-10-CM | POA: Diagnosis not present

## 2016-10-23 DIAGNOSIS — M9902 Segmental and somatic dysfunction of thoracic region: Secondary | ICD-10-CM | POA: Diagnosis not present

## 2016-10-23 DIAGNOSIS — M9903 Segmental and somatic dysfunction of lumbar region: Secondary | ICD-10-CM | POA: Diagnosis not present

## 2016-10-23 DIAGNOSIS — M5386 Other specified dorsopathies, lumbar region: Secondary | ICD-10-CM | POA: Diagnosis not present

## 2016-10-23 DIAGNOSIS — F4310 Post-traumatic stress disorder, unspecified: Secondary | ICD-10-CM | POA: Diagnosis not present

## 2016-10-23 DIAGNOSIS — M5414 Radiculopathy, thoracic region: Secondary | ICD-10-CM | POA: Diagnosis not present

## 2016-10-23 DIAGNOSIS — M5032 Other cervical disc degeneration, mid-cervical region, unspecified level: Secondary | ICD-10-CM | POA: Diagnosis not present

## 2016-10-23 DIAGNOSIS — M9901 Segmental and somatic dysfunction of cervical region: Secondary | ICD-10-CM | POA: Diagnosis not present

## 2016-10-25 DIAGNOSIS — M9901 Segmental and somatic dysfunction of cervical region: Secondary | ICD-10-CM | POA: Diagnosis not present

## 2016-10-25 DIAGNOSIS — M5386 Other specified dorsopathies, lumbar region: Secondary | ICD-10-CM | POA: Diagnosis not present

## 2016-10-25 DIAGNOSIS — M5414 Radiculopathy, thoracic region: Secondary | ICD-10-CM | POA: Diagnosis not present

## 2016-10-25 DIAGNOSIS — M9902 Segmental and somatic dysfunction of thoracic region: Secondary | ICD-10-CM | POA: Diagnosis not present

## 2016-10-25 DIAGNOSIS — M9903 Segmental and somatic dysfunction of lumbar region: Secondary | ICD-10-CM | POA: Diagnosis not present

## 2016-10-25 DIAGNOSIS — M5032 Other cervical disc degeneration, mid-cervical region, unspecified level: Secondary | ICD-10-CM | POA: Diagnosis not present

## 2016-10-31 DIAGNOSIS — M5386 Other specified dorsopathies, lumbar region: Secondary | ICD-10-CM | POA: Diagnosis not present

## 2016-10-31 DIAGNOSIS — M9903 Segmental and somatic dysfunction of lumbar region: Secondary | ICD-10-CM | POA: Diagnosis not present

## 2016-10-31 DIAGNOSIS — F4310 Post-traumatic stress disorder, unspecified: Secondary | ICD-10-CM | POA: Diagnosis not present

## 2016-10-31 DIAGNOSIS — M5414 Radiculopathy, thoracic region: Secondary | ICD-10-CM | POA: Diagnosis not present

## 2016-10-31 DIAGNOSIS — M9902 Segmental and somatic dysfunction of thoracic region: Secondary | ICD-10-CM | POA: Diagnosis not present

## 2016-10-31 DIAGNOSIS — M9901 Segmental and somatic dysfunction of cervical region: Secondary | ICD-10-CM | POA: Diagnosis not present

## 2016-10-31 DIAGNOSIS — M5032 Other cervical disc degeneration, mid-cervical region, unspecified level: Secondary | ICD-10-CM | POA: Diagnosis not present

## 2016-11-01 ENCOUNTER — Emergency Department (INDEPENDENT_AMBULATORY_CARE_PROVIDER_SITE_OTHER)
Admission: EM | Admit: 2016-11-01 | Discharge: 2016-11-01 | Disposition: A | Discharge status: Home / Self Care | Payer: Medicare Other | Source: Home / Self Care | Attending: Family Medicine | Admitting: Family Medicine

## 2016-11-01 ENCOUNTER — Encounter: Payer: Self-pay | Admitting: *Deleted

## 2016-11-01 DIAGNOSIS — T50905D Adverse effect of unspecified drugs, medicaments and biological substances, subsequent encounter: Secondary | ICD-10-CM

## 2016-11-01 DIAGNOSIS — M5412 Radiculopathy, cervical region: Secondary | ICD-10-CM

## 2016-11-01 DIAGNOSIS — K921 Melena: Secondary | ICD-10-CM

## 2016-11-01 LAB — POCT CBC W AUTO DIFF (K'VILLE URGENT CARE)

## 2016-11-01 MED ORDER — METHYLPREDNISOLONE ACETATE 80 MG/ML IJ SUSP
80.0000 mg | Freq: Once | INTRAMUSCULAR | Status: AC
  Administered 2016-11-01: 80 mg via INTRAMUSCULAR

## 2016-11-01 NOTE — ED Provider Notes (Signed)
[name]    CSN: [id number] Arrival date & time: 11/01/16  1633     History   Chief Complaint Chief Complaint  Patient presents with  . Eye Problem    Periorbital redness  . Tingling    Arm  . Medication Reaction    Seroquel    HPI Tamara [name] is a 56 y.o. female.   Patient presents with several complaints: 1)  Patient was recently prescribed Seroquel for insomnia, and took her first 50mg  tab last night.  She did not remember that she had had an adverse reaction to Seroquel in the distant past, and developed tingling in her lips.  No swelling of tongue, difficulty swallowing, wheezing, or shortness of breath.  Today she has felt "weird," but lip tingling has resolved and she has a mild headache.  At about noon today she developed mild painless swelling beneath her left eye.  No changes in vision. 2)  Today she has noticed vague soreness in her left neck, shoulder, and left scapular area associated with vague intermittent ache/tingling down her left arm.  This paresthesia is worse when she flexes her neck to the left. 3)  Today she had two separate bowel movements with significant amounts of bright red blood.  No anal or abdominal pain.  Until today her bowel movements have been normal.  She has a past history of colonoscopy age 89 that revealed polyps.  She has a family history of colon CA in a paternal uncle.  She has not had a followup colonoscopy.    Eye Problem  Associated symptoms: no discharge, no itching, no photophobia and no redness     Past Medical History:  Diagnosis Date  . Allergy   . Anemia   . Anxiety   . Asthma   . Depression   . Diverticulitis   . GERD (gastroesophageal reflux disease)   . History of short term memory loss   . HTN (hypertension)   . Hyperlipemia   . Neuromuscular disorder ([place])   . Shock therapy as cause of abnormal reaction of patient or of later complication without mention of misadventure at time of procedure      Patient Active Problem List   Diagnosis Date Noted  . Multinodular non-toxic goiter 02/16/2015  . Migraine 11/05/2013  . Stroke ([place]) 10/22/2013  . ASTHMA 08/01/2010  . Hyperlipidemia 09/17/2007  . OBESITY 09/17/2007  . Anxiety state 09/17/2007  . DEPRESSION 09/17/2007  . Essential hypertension 09/17/2007  . GERD 09/17/2007    Past Surgical History:  Procedure Laterality Date  . ABDOMINAL HYSTERECTOMY     partial  . CHOLECYSTECTOMY    . COLONOSCOPY      OB History    No data available       Home Medications    Prior to Admission medications   Medication Sig Start Date End Date Taking? Authorizing Provider  albuterol (PROVENTIL HFA;VENTOLIN HFA) 108 (90 Base) MCG/ACT inhaler Inhale 2 puffs into the lungs every 6 (six) hours as needed for wheezing or shortness of breath. 07/05/15   Leandrew Koyanagi, MD  ALPRAZolam (ALPRAZOLAM XR) 1 MG 24 hr tablet Take 3 tablets (3 mg total) by mouth at bedtime. Patient taking differently: Take 1 mg by mouth at bedtime.  09/02/14   [name], MD  ALPRAZolam Duanne Moron) 1 MG tablet Take 1 tablet (1 mg total) by mouth daily as needed for anxiety. Patient taking differently: Take 1 mg by mouth 2 (two) times daily as needed  for anxiety.  09/02/14   [name], MD  aspirin 325 MG tablet Take 1 tablet (325 mg total) by mouth daily. 10/22/13   Bernadene Bell, MD  baclofen (LIORESAL) 10 MG tablet Take 1 tablet (10 mg total) by mouth 3 (three) times daily. 07/05/15   Leandrew Koyanagi, MD  Biotin 5000 MCG CAPS Take 1 capsule by mouth daily.    Historical Provider, MD  Chlorophyll 100 MG TABS Take 1 tablet by mouth daily.    Historical Provider, MD  Cholecalciferol (VITAMIN D-3) 1000 UNITS CAPS Take 1 capsule by mouth daily.    Historical Provider, MD  Cholecalciferol (VITAMIN D3) 50000 UNITS CAPS Take 50,000 Units by mouth once a week. 09/24/14   [name], MD  dexlansoprazole (DEXILANT) 60 MG capsule Take 1 capsule (60 mg total) by mouth  daily. 09/02/14   [name], MD  diclofenac sodium (VOLTAREN) 1 % GEL Apply 2 g topically daily as needed (for pain). 07/05/15   Leandrew Koyanagi, MD  doxycycline (VIBRA-TABS) 100 MG tablet Take 1 tablet (100 mg total) by mouth 2 (two) times daily. 07/05/15   Leandrew Koyanagi, MD  fenofibrate (TRICOR) 145 MG tablet Take 1 tablet (145 mg total) by mouth daily. 05/22/16   Wardell Honour, MD  gemfibrozil (LOPID) 600 MG tablet Take 1 tablet (600 mg total) by mouth 2 (two) times daily before a meal. Patient not taking: Reported on 04/16/2016 11/01/15   [name], MD  Nyoka Cowden Tea, Camillia sinensis, (GREEN TEA EXTRACT PO) Take 400 mg by mouth daily.    Historical Provider, MD  losartan (COZAAR) 100 MG tablet Take 1 tablet (100 mg total) by mouth daily. 09/07/15   [name], MD  LYSINE PO Take 1 tablet by mouth as needed. prn    Historical Provider, MD  MAGNESIUM GLYCINATE PLUS PO Take 100 mg by mouth daily.    Historical Provider, MD  OVER THE COUNTER MEDICATION R-Lipoic Acid. Take 1 tablet by mouth daily.    Historical Provider, MD  Oxcarbazepine (TRILEPTAL) 300 MG tablet Take 1 tablet (300 mg total) by mouth 2 (two) times daily. 09/02/14   [name], MD  perphenazine (TRILAFON) 4 MG tablet Take 4 mg by mouth 2 (two) times daily.    Historical Provider, MD  Riboflavin 400 MG CAPS Take 1 capsule by mouth daily.    Historical Provider, MD  Taurine 1000 MG CAPS Take 1 capsule by mouth daily.    Historical Provider, MD  temazepam (RESTORIL) 15 MG capsule Take 1 capsule (15 mg total) by mouth at bedtime. Patient taking differently: Take 30 mg by mouth at bedtime.  05/05/15   [name], MD  vitamin C (ASCORBIC ACID) 500 MG tablet Take 500 mg by mouth daily.    Historical Provider, MD    Family History Family History  Problem Relation Age of Onset  . Arrhythmia Father   . Stroke Father   . Hyperlipidemia Father   . Heart disease Father   . Hypertension Father   . Coronary artery  disease Maternal Grandfather 40    Died 58  . Heart disease Maternal Grandfather   . Hypertension Maternal Grandfather   . Cancer Mother   . Hyperlipidemia Mother   . Hypertension Mother   . Hearing loss Paternal Grandmother   . Hearing loss Paternal Grandfather   . Diabetes Paternal Grandfather   . Diabetes Daughter   . Colon cancer Paternal Uncle   . Hyperlipidemia Brother   .  Hyperlipidemia Maternal Grandmother     Social History Social History  Substance Use Topics  . Smoking status: Never Smoker  . Smokeless tobacco: Never Used  . Alcohol use No     Allergies   Statins; Adhesive [tape]; Gabapentin; Ivp dye [iodinated diagnostic agents]; Levaquin [levofloxacin in d5w]; Levaquin [levofloxacin]; Oxycodone; Propoxyphene hcl; Seroquel [quetiapine fumerate]; Penicillins; and Sulfonamide derivatives   Review of Systems Review of Systems  Constitutional: Negative for activity change, appetite change, chills, diaphoresis, fatigue, fever and unexpected weight change.  HENT: Negative.   Eyes: Negative for photophobia, pain, discharge, redness, itching and visual disturbance.  Respiratory: Negative.   Cardiovascular: Negative.   Gastrointestinal: Positive for blood in stool. Negative for abdominal pain and rectal pain.  Genitourinary: Negative.   Musculoskeletal:       Complains of soreness left neck, scapula, and shoulder.  Skin:       Complains of vague swelling beneath left eye today.  Neurological: Negative for dizziness.       Paresthesias left arm.     Physical Exam Triage Vital Signs ED Triage Vitals  Enc Vitals Group     BP 11/01/16 1701 116/83     Pulse Rate 11/01/16 1701 (!) 104     Resp 11/01/16 1701 14     Temp 11/01/16 1701 98.4 F (36.9 C)     Temp Source 11/01/16 1701 Oral     SpO2 11/01/16 1701 99 %     Weight 11/01/16 1702 193 lb (87.5 kg)     Height --      Head Circumference --      Peak Flow --      Pain Score 11/01/16 1702 0     Pain Loc --       Pain Edu? --      Excl. in [place]? --    No data found.   Updated Vital Signs BP 116/83 (BP Location: Left Arm)   Pulse (!) 104   Temp 98.4 F (36.9 C) (Oral)   Resp 14   Wt 193 lb (87.5 kg)   SpO2 99%   BMI 34.74 kg/m   Visual Acuity Right Eye Distance:   Left Eye Distance:   Bilateral Distance:    Right Eye Near:   Left Eye Near:    Bilateral Near:     Physical Exam  Constitutional: She is oriented to person, place, and time. She appears well-developed and well-nourished. No distress.  HENT:  Right Ear: Tympanic membrane, external ear and ear canal normal.  Left Ear: Tympanic membrane, external ear and ear canal normal.  Nose: Nose normal.  Mouth/Throat: Oropharynx is clear and moist.  Eyes: Conjunctivae, EOM and lids are normal. Pupils are equal, round, and reactive to light.    There is mild nontender swelling beneath her left eye.  Neck: Normal range of motion. Normal carotid pulses present. Carotid bruit is not present.  Cardiovascular: Normal heart sounds.   Pulmonary/Chest: Breath sounds normal.  Abdominal: She exhibits no distension and no mass. There is no tenderness. There is no guarding.  Musculoskeletal: She exhibits no edema.       Left shoulder: She exhibits normal range of motion.       Arms: There is vague tenderness to palpation left neck, shoulder, and trapezius area.  Neurological: She is alert and oriented to person, place, and time. She displays normal reflexes. No cranial nerve deficit or sensory deficit. Coordination normal.  Skin: Skin is warm and dry.  Nursing note  and vitals reviewed.    UC Treatments / Results  Labs (all labs ordered are listed, but only abnormal results are displayed) Labs Reviewed  POCT CBC W AUTO DIFF ([facility]):  WBC 8.0; LY 40.0; MO 3.0; GR 57.0; Hgb 13.8; Platelets 343     EKG  EKG Interpretation None       Radiology No results found.  Review of previous records:  C-spine X-rays done  02/23/15 showed "ventral spurring at C6-7, and C7-T1"   Procedures Procedures (including critical care time)  Medications Ordered in UC Medications  methylPREDNISolone acetate (DEPO-MEDROL) injection 80 mg (80 mg Intramuscular Given 11/01/16 1816)     Initial Impression / Assessment and Plan / UC Course  I have reviewed the triage vital signs and the nursing notes.  Pertinent labs & imaging results that were available during my care of the patient were reviewed by me and considered in my medical decision making (see chart for details).    Administered Depo Medrol 80mg  IM Followup with gastroenterologist Dr. Henrene Pastor as soon as possible for evaluation of hematochezia.  Normal Hgb (13.8) reassuring. Followup with Dr. Aundria Mems or Dr. Lynne Leader ([facility]) for evaluation of cervical radiculopathy. If symptoms become significantly worse during the night or over the weekend, proceed to the local emergency room.     Final Clinical Impressions(s) / UC Diagnoses   Final diagnoses:  Cervical radiculopathy  Hematochezia  Adverse effect of drug, subsequent encounter    New Prescriptions New Prescriptions   No medications on file     Kandra Nicolas, MD 11/02/16 [phone]

## 2016-11-01 NOTE — ED Triage Notes (Signed)
Patient reports taking 1 dose of new Rx for Seroquel for sleep last night. She has a h/o reaction to Seroquel, lip tingling, but did not realize that is what she had taken. She is c/o periorbital redness, mental fogginess, and left arm tingling. She noted blood on the toilet paper after wiping after a bowel movement today.

## 2016-11-01 NOTE — Discharge Instructions (Signed)
If symptoms become significantly worse during the night or over the weekend, proceed to the local emergency room.  

## 2016-11-02 ENCOUNTER — Telehealth: Payer: Self-pay | Admitting: Emergency Medicine

## 2016-11-02 DIAGNOSIS — M5032 Other cervical disc degeneration, mid-cervical region, unspecified level: Secondary | ICD-10-CM | POA: Diagnosis not present

## 2016-11-02 DIAGNOSIS — M9902 Segmental and somatic dysfunction of thoracic region: Secondary | ICD-10-CM | POA: Diagnosis not present

## 2016-11-02 DIAGNOSIS — M5386 Other specified dorsopathies, lumbar region: Secondary | ICD-10-CM | POA: Diagnosis not present

## 2016-11-02 DIAGNOSIS — F4310 Post-traumatic stress disorder, unspecified: Secondary | ICD-10-CM | POA: Diagnosis not present

## 2016-11-02 DIAGNOSIS — M9901 Segmental and somatic dysfunction of cervical region: Secondary | ICD-10-CM | POA: Diagnosis not present

## 2016-11-02 DIAGNOSIS — M5414 Radiculopathy, thoracic region: Secondary | ICD-10-CM | POA: Diagnosis not present

## 2016-11-02 DIAGNOSIS — M9903 Segmental and somatic dysfunction of lumbar region: Secondary | ICD-10-CM | POA: Diagnosis not present

## 2016-11-02 NOTE — Telephone Encounter (Signed)
Feeling better than yesterday, is seeing GI next Friday

## 2016-11-06 DIAGNOSIS — F4310 Post-traumatic stress disorder, unspecified: Secondary | ICD-10-CM | POA: Diagnosis not present

## 2016-11-06 DIAGNOSIS — M5386 Other specified dorsopathies, lumbar region: Secondary | ICD-10-CM | POA: Diagnosis not present

## 2016-11-06 DIAGNOSIS — M9902 Segmental and somatic dysfunction of thoracic region: Secondary | ICD-10-CM | POA: Diagnosis not present

## 2016-11-06 DIAGNOSIS — M5032 Other cervical disc degeneration, mid-cervical region, unspecified level: Secondary | ICD-10-CM | POA: Diagnosis not present

## 2016-11-06 DIAGNOSIS — M9903 Segmental and somatic dysfunction of lumbar region: Secondary | ICD-10-CM | POA: Diagnosis not present

## 2016-11-06 DIAGNOSIS — M9901 Segmental and somatic dysfunction of cervical region: Secondary | ICD-10-CM | POA: Diagnosis not present

## 2016-11-06 DIAGNOSIS — M5414 Radiculopathy, thoracic region: Secondary | ICD-10-CM | POA: Diagnosis not present

## 2016-11-08 DIAGNOSIS — M9902 Segmental and somatic dysfunction of thoracic region: Secondary | ICD-10-CM | POA: Diagnosis not present

## 2016-11-08 DIAGNOSIS — M9903 Segmental and somatic dysfunction of lumbar region: Secondary | ICD-10-CM | POA: Diagnosis not present

## 2016-11-08 DIAGNOSIS — M5414 Radiculopathy, thoracic region: Secondary | ICD-10-CM | POA: Diagnosis not present

## 2016-11-08 DIAGNOSIS — M5032 Other cervical disc degeneration, mid-cervical region, unspecified level: Secondary | ICD-10-CM | POA: Diagnosis not present

## 2016-11-08 DIAGNOSIS — M9901 Segmental and somatic dysfunction of cervical region: Secondary | ICD-10-CM | POA: Diagnosis not present

## 2016-11-08 DIAGNOSIS — F4310 Post-traumatic stress disorder, unspecified: Secondary | ICD-10-CM | POA: Diagnosis not present

## 2016-11-08 DIAGNOSIS — M5386 Other specified dorsopathies, lumbar region: Secondary | ICD-10-CM | POA: Diagnosis not present

## 2016-11-09 ENCOUNTER — Ambulatory Visit (INDEPENDENT_AMBULATORY_CARE_PROVIDER_SITE_OTHER): Payer: Medicare Other | Admitting: Physician Assistant

## 2016-11-09 ENCOUNTER — Other Ambulatory Visit (INDEPENDENT_AMBULATORY_CARE_PROVIDER_SITE_OTHER): Payer: Medicare Other

## 2016-11-09 ENCOUNTER — Encounter: Payer: Self-pay | Admitting: Physician Assistant

## 2016-11-09 VITALS — BP 116/62 | HR 90 | Ht 62.5 in | Wt 193.0 lb

## 2016-11-09 DIAGNOSIS — R197 Diarrhea, unspecified: Secondary | ICD-10-CM

## 2016-11-09 DIAGNOSIS — R109 Unspecified abdominal pain: Secondary | ICD-10-CM | POA: Diagnosis not present

## 2016-11-09 DIAGNOSIS — K625 Hemorrhage of anus and rectum: Secondary | ICD-10-CM

## 2016-11-09 DIAGNOSIS — K5730 Diverticulosis of large intestine without perforation or abscess without bleeding: Secondary | ICD-10-CM | POA: Insufficient documentation

## 2016-11-09 LAB — BASIC METABOLIC PANEL
BUN: 16 mg/dL (ref 6–23)
CALCIUM: 10 mg/dL (ref 8.4–10.5)
CO2: 28 mEq/L (ref 19–32)
Chloride: 105 mEq/L (ref 96–112)
Creatinine, Ser: 1.16 mg/dL (ref 0.40–1.20)
GFR: 51.61 mL/min — AB (ref >60.00)
Glucose, Bld: 76 mg/dL (ref 70–99)
POTASSIUM: 3.9 meq/L (ref 3.5–5.1)
SODIUM: 140 meq/L (ref 135–145)

## 2016-11-09 NOTE — Patient Instructions (Signed)
If you are age 55 or older, your body mass index should be between 23-30. Your Body mass index is 34.74 kg/m. If this is out of the aforementioned range listed, please consider follow up with your Primary Care Provider.  If you are age 29 or younger, your body mass index should be between 19-25. Your Body mass index is 34.74 kg/m. If this is out of the aformentioned range listed, please consider follow up with your Primary Care Provider.   You have been scheduled for a CT scan of the abdomen and pelvis at [facility] ([address]---this is in the same building as [facility]).   You are scheduled on 11/12/16 at 1000 am. You should arrive 15 minutes prior to your appointment time for registration. Please follow the written instructions below on the day of your exam:  WARNING: IF YOU ARE ALLERGIC TO IODINE/X-RAY DYE, PLEASE NOTIFY RADIOLOGY IMMEDIATELY AT [phone]! YOU WILL BE GIVEN A 13 HOUR PREMEDICATION PREP.  1) Do not eat anything after 600 am (4 hours prior to your test) 2) You have been given 2 bottles of oral contrast to drink. The solution may taste               better if refrigerated, but do NOT add ice or any other liquid to this solution. Shake             well before drinking.    Drink 1 bottle of contrast @ 800 am (2 hours prior to your exam)  Drink 1 bottle of contrast @ 900 am (1 hour prior to your exam)  You may take any medications as prescribed with a small amount of water except for the following: Metformin, Glucophage, Glucovance, Avandamet, Riomet, Fortamet, Actoplus Met, Janumet, Glumetza or Metaglip. The above medications must be held the day of the exam AND 48 hours after the exam.  The purpose of you drinking the oral contrast is to aid in the visualization of your intestinal tract. The contrast solution may cause some diarrhea. Before your exam is started, you will be given a small amount of fluid to drink. Depending on your individual set of  symptoms, you may also receive an intravenous injection of x-ray contrast/dye. Plan on being at [facility] for 30 minutes or longer, depending on the type of exam you are having performed.  This test typically takes 30-45 minutes to complete.  If you have any questions regarding your exam or if you need to reschedule, you may call the CT department at [phone] between the hours of 8:00 am and 5:00 pm, Monday-Friday.  ________________________________________________________________________ Your physician has requested that you go to the basement for the following lab work before leaving today: [employer] a bland diet  May use Tylenol as needed per pain.  Thank you for choosing me and [facility].  Amy Esterwood, PA-C

## 2016-11-09 NOTE — Progress Notes (Signed)
Subjective:    Patient ID: Tamara [name], female    DOB: 01-Mar-1961, 56 y.o.   MRN: [id number]  HPI Tamara [name] is a pleasant 56 year old white female, known to Dr. Henrene Pastor who was last seen in our office in 2014. She comes in today referred by: Urgent care/[place] /Dr Beese,after recent episode of acute abdominal pain, diarrhea and rectal bleeding. Patient has history of hypertension, CVA, GERD, asthma, migraines, anxiety and depression. She had colonoscopy in May 2014 per Dr. Henrene Pastor which showed mild diffuse colonic diverticulosis and internal hemorrhoids there were no polyps. EGD in 2012 showed a large caliber distal esophageal ring otherwise normal exam. States that she was started on Seroquel for insomnia and took her first dose on 10/31/2016. She says she slept well but the following morning when she woke up she had swelling around her eyes and generally did not feel "right". She says by lunchtime the swelling had increased. She tried to eat lunch and then afterwards had abdominal pain followed by 2 loose bowel movements containing blood and clots. She went to the Urgent care /[place] because while she was driving she felt some numbness in her arm. Patient states that cardiac evaluation was negative. CBC was done with WBC of 8, hemoglobin 13.8 and hematocrit of 42.1. She was advised to follow up with GI because of the rectal bleeding.   She says she has developed bloating distention and lower abdominal pressure-type pain that day which has been persistent ever since. Also has some pain in her lower back and hips. She has not had any fever no further diarrhea and has not seen any more blood. She is very fatigued.  Review of Systems Pertinent positive and negative review of systems were noted in the above HPI section.  All other review of systems was otherwise negative.  Outpatient Encounter Prescriptions as of 11/09/2016  Medication Sig  . albuterol (PROVENTIL HFA;VENTOLIN HFA) 108 (90 Base)  MCG/ACT inhaler Inhale 2 puffs into the lungs every 6 (six) hours as needed for wheezing or shortness of breath.  . ALPRAZolam (XANAX) 1 MG tablet Take 1 tablet (1 mg total) by mouth daily as needed for anxiety. (Patient taking differently: Take 1 mg by mouth 2 (two) times daily as needed for anxiety. )  . aspirin 325 MG tablet Take 1 tablet (325 mg total) by mouth daily.  . baclofen (LIORESAL) 10 MG tablet Take 1 tablet (10 mg total) by mouth 3 (three) times daily.  . Biotin 5000 MCG CAPS Take 1 capsule by mouth daily.  . Chlorophyll 100 MG TABS Take 1 tablet by mouth daily.  . Cholecalciferol (VITAMIN D-3) 1000 UNITS CAPS Take 1 capsule by mouth daily.  . Cholecalciferol (VITAMIN D3) 50000 UNITS CAPS Take 50,000 Units by mouth once a week.  Marland Kitchen dexlansoprazole (DEXILANT) 60 MG capsule Take 1 capsule (60 mg total) by mouth daily.  . diclofenac sodium (VOLTAREN) 1 % GEL Apply 2 g topically daily as needed (for pain).  Marland Kitchen doxycycline (VIBRA-TABS) 100 MG tablet Take 1 tablet (100 mg total) by mouth 2 (two) times daily.  . fenofibrate (TRICOR) 145 MG tablet Take 1 tablet (145 mg total) by mouth daily.  Nyoka Cowden Tea, Camillia sinensis, (GREEN TEA EXTRACT PO) Take 400 mg by mouth daily.  Marland Kitchen losartan (COZAAR) 100 MG tablet Take 1 tablet (100 mg total) by mouth daily.  Marland Kitchen LYSINE PO Take 1 tablet by mouth as needed. prn  . MAGNESIUM GLYCINATE PLUS PO Take 100 mg by mouth  daily.  Marland Kitchen OVER THE COUNTER MEDICATION R-Lipoic Acid. Take 1 tablet by mouth daily.  Marland Kitchen perphenazine (TRILAFON) 4 MG tablet Take 4 mg by mouth 2 (two) times daily.  . Riboflavin 400 MG CAPS Take 1 capsule by mouth daily.  . Taurine 1000 MG CAPS Take 1 capsule by mouth daily.  . vitamin C (ASCORBIC ACID) 500 MG tablet Take 500 mg by mouth daily.  . [DISCONTINUED] ALPRAZolam (ALPRAZOLAM XR) 1 MG 24 hr tablet Take 3 tablets (3 mg total) by mouth at bedtime. (Patient taking differently: Take 1 mg by mouth at bedtime. )  . [DISCONTINUED]  gemfibrozil (LOPID) 600 MG tablet Take 1 tablet (600 mg total) by mouth 2 (two) times daily before a meal. (Patient not taking: Reported on 04/16/2016)  . [DISCONTINUED] Oxcarbazepine (TRILEPTAL) 300 MG tablet Take 1 tablet (300 mg total) by mouth 2 (two) times daily.  . [DISCONTINUED] temazepam (RESTORIL) 15 MG capsule Take 1 capsule (15 mg total) by mouth at bedtime. (Patient taking differently: Take 30 mg by mouth at bedtime. )   No facility-administered encounter medications on file as of 11/09/2016.    Allergies  Allergen Reactions  . Statins Other (See Comments)    Severe mm pain (esp. Arms) >> DOES NOT WANT TO RETRY!  . Adhesive [Tape]     This is tape as well as adhesive on bandaids.  . Gabapentin     Unknown reaction  . Ivp Dye [Iodinated Diagnostic Agents] Swelling  . Levaquin [Levofloxacin In D5w] Other (See Comments)    GI upset  . Levaquin [Levofloxacin] Nausea And Vomiting  . Oxycodone   . Propoxyphene Hcl Other (See Comments)    hallucination  . Seroquel [Quetiapine Fumerate]     Unknown reaction  . Penicillins Rash  . Sulfonamide Derivatives Rash   Patient Active Problem List   Diagnosis Date Noted  . Diverticulosis of colon without hemorrhage 11/09/2016  . Multinodular non-toxic goiter 02/16/2015  . Migraine 11/05/2013  . Stroke ([place]) 10/22/2013  . ASTHMA 08/01/2010  . Hyperlipidemia 09/17/2007  . OBESITY 09/17/2007  . Anxiety state 09/17/2007  . DEPRESSION 09/17/2007  . Essential hypertension 09/17/2007  . GERD 09/17/2007   Social History   Social History  . Marital status: Divorced    Spouse name: N/A  . Number of children: 3  . Years of education: college   Occupational History  .  Unemployed    Disabled   Social History Main Topics  . Smoking status: Never Smoker  . Smokeless tobacco: Never Used  . Alcohol use No  . Drug use: No  . Sexual activity: Yes    Birth control/ protection: None   Other Topics Concern  . Not on file   Social  History Narrative   Lives with boyfriend. Walks daily for 20 minutes. Education: [employer].    Tamara [name] family history includes Arrhythmia in her father; Cancer in her mother; Colon cancer in her paternal uncle; Coronary artery disease (age of onset: 83) in her maternal grandfather; Diabetes in her daughter and paternal grandfather; Hearing loss in her paternal grandfather and paternal grandmother; Heart disease in her father and maternal grandfather; Hyperlipidemia in her brother, father, maternal grandmother, and mother; Hypertension in her father, maternal grandfather, and mother; Stroke in her father.      Objective:    Vitals:   11/09/16 1407  BP: 116/62  Pulse: 90    Physical Exam  well-developed white female in no acute distress, pleasant blood pressure 116/62 pulse  90, height 5 foot 2, weight 193, BMI of 34.7. HEENT; nontraumatic normocephalic EOMI PERRLA sclera anicteric, Cardiovascular ;regular rate and rhythm with S1-S2 no murmur or gallop, Pulmonary; clear bilaterally, Abdomen ;obese soft, bowel sounds are present she tender bilaterally in the lower quadrants and suprapubic area there is no guarding or rebound no mass or hepatosplenomegaly, Rectal ;exam not done, Extremities ;no clubbing cyanosis or edema skin warm and dry, Neuropsych; mood and affect appropriate       Assessment & Plan:   #50 56 year old white female with acute allergic reaction/adverse medication reaction to 1 dose of Seroquel on 10/31/2016 with development of periorbital edema several hours later followed by lower abdominal pain diarrhea and bloody stool. Patient has not had any further diarrhea or bleeding but has had persistence of lower abdominal bloating pain and discomfort over the past 1 week. I suspect this is all secondary to adverse medication reaction -possible angioedema involving her bowel, versus precipitation of segmental ischemic colitis. #2 diverticulosis   #3 internal hemorrhoids #4  hypertension  #5 goiter #6 history of CVA  Plan; soft bland diet We'll schedule for CT of the abdomen and pelvis with contrast She is hesitant to take any other medications, were use Tylenol as needed for discomfort. We discussed the natural course of segmental ischemic colitis, slow  resolution to complete healing and hopefully she will continue to improve over the next week or so.  Allene Furuya Genia Harold PA-C 11/09/2016   Cc: [name], MD

## 2016-11-09 NOTE — Progress Notes (Signed)
PA assessment and plans reviewed

## 2016-11-12 ENCOUNTER — Ambulatory Visit (INDEPENDENT_AMBULATORY_CARE_PROVIDER_SITE_OTHER)
Admission: RE | Admit: 2016-11-12 | Discharge: 2016-11-12 | Disposition: A | Discharge status: Home / Self Care | Payer: Medicare Other | Source: Ambulatory Visit | Attending: Physician Assistant | Admitting: Physician Assistant

## 2016-11-12 DIAGNOSIS — R197 Diarrhea, unspecified: Secondary | ICD-10-CM

## 2016-11-12 DIAGNOSIS — K625 Hemorrhage of anus and rectum: Secondary | ICD-10-CM

## 2016-11-12 DIAGNOSIS — R109 Unspecified abdominal pain: Secondary | ICD-10-CM

## 2016-11-12 DIAGNOSIS — K760 Fatty (change of) liver, not elsewhere classified: Secondary | ICD-10-CM | POA: Diagnosis not present

## 2016-11-12 DIAGNOSIS — K449 Diaphragmatic hernia without obstruction or gangrene: Secondary | ICD-10-CM | POA: Diagnosis not present

## 2016-11-13 DIAGNOSIS — M9902 Segmental and somatic dysfunction of thoracic region: Secondary | ICD-10-CM | POA: Diagnosis not present

## 2016-11-13 DIAGNOSIS — M9903 Segmental and somatic dysfunction of lumbar region: Secondary | ICD-10-CM | POA: Diagnosis not present

## 2016-11-13 DIAGNOSIS — M5386 Other specified dorsopathies, lumbar region: Secondary | ICD-10-CM | POA: Diagnosis not present

## 2016-11-13 DIAGNOSIS — M5414 Radiculopathy, thoracic region: Secondary | ICD-10-CM | POA: Diagnosis not present

## 2016-11-13 DIAGNOSIS — M5032 Other cervical disc degeneration, mid-cervical region, unspecified level: Secondary | ICD-10-CM | POA: Diagnosis not present

## 2016-11-13 DIAGNOSIS — F4310 Post-traumatic stress disorder, unspecified: Secondary | ICD-10-CM | POA: Diagnosis not present

## 2016-11-13 DIAGNOSIS — M9901 Segmental and somatic dysfunction of cervical region: Secondary | ICD-10-CM | POA: Diagnosis not present

## 2016-11-15 DIAGNOSIS — M9903 Segmental and somatic dysfunction of lumbar region: Secondary | ICD-10-CM | POA: Diagnosis not present

## 2016-11-15 DIAGNOSIS — F4310 Post-traumatic stress disorder, unspecified: Secondary | ICD-10-CM | POA: Diagnosis not present

## 2016-11-15 DIAGNOSIS — M9902 Segmental and somatic dysfunction of thoracic region: Secondary | ICD-10-CM | POA: Diagnosis not present

## 2016-11-15 DIAGNOSIS — M5032 Other cervical disc degeneration, mid-cervical region, unspecified level: Secondary | ICD-10-CM | POA: Diagnosis not present

## 2016-11-15 DIAGNOSIS — M5386 Other specified dorsopathies, lumbar region: Secondary | ICD-10-CM | POA: Diagnosis not present

## 2016-11-15 DIAGNOSIS — M9901 Segmental and somatic dysfunction of cervical region: Secondary | ICD-10-CM | POA: Diagnosis not present

## 2016-11-15 DIAGNOSIS — M5414 Radiculopathy, thoracic region: Secondary | ICD-10-CM | POA: Diagnosis not present

## 2016-11-19 DIAGNOSIS — F4310 Post-traumatic stress disorder, unspecified: Secondary | ICD-10-CM | POA: Diagnosis not present

## 2016-11-23 DIAGNOSIS — M5032 Other cervical disc degeneration, mid-cervical region, unspecified level: Secondary | ICD-10-CM | POA: Diagnosis not present

## 2016-11-23 DIAGNOSIS — M5414 Radiculopathy, thoracic region: Secondary | ICD-10-CM | POA: Diagnosis not present

## 2016-11-23 DIAGNOSIS — M9902 Segmental and somatic dysfunction of thoracic region: Secondary | ICD-10-CM | POA: Diagnosis not present

## 2016-11-23 DIAGNOSIS — M9901 Segmental and somatic dysfunction of cervical region: Secondary | ICD-10-CM | POA: Diagnosis not present

## 2016-11-23 DIAGNOSIS — M5386 Other specified dorsopathies, lumbar region: Secondary | ICD-10-CM | POA: Diagnosis not present

## 2016-11-23 DIAGNOSIS — M9903 Segmental and somatic dysfunction of lumbar region: Secondary | ICD-10-CM | POA: Diagnosis not present

## 2016-11-27 DIAGNOSIS — F4310 Post-traumatic stress disorder, unspecified: Secondary | ICD-10-CM | POA: Diagnosis not present

## 2016-11-30 DIAGNOSIS — M5032 Other cervical disc degeneration, mid-cervical region, unspecified level: Secondary | ICD-10-CM | POA: Diagnosis not present

## 2016-11-30 DIAGNOSIS — F4310 Post-traumatic stress disorder, unspecified: Secondary | ICD-10-CM | POA: Diagnosis not present

## 2016-11-30 DIAGNOSIS — M5386 Other specified dorsopathies, lumbar region: Secondary | ICD-10-CM | POA: Diagnosis not present

## 2016-11-30 DIAGNOSIS — M9902 Segmental and somatic dysfunction of thoracic region: Secondary | ICD-10-CM | POA: Diagnosis not present

## 2016-11-30 DIAGNOSIS — M9903 Segmental and somatic dysfunction of lumbar region: Secondary | ICD-10-CM | POA: Diagnosis not present

## 2016-11-30 DIAGNOSIS — M9901 Segmental and somatic dysfunction of cervical region: Secondary | ICD-10-CM | POA: Diagnosis not present

## 2016-11-30 DIAGNOSIS — M5414 Radiculopathy, thoracic region: Secondary | ICD-10-CM | POA: Diagnosis not present

## 2016-12-04 DIAGNOSIS — F4310 Post-traumatic stress disorder, unspecified: Secondary | ICD-10-CM | POA: Diagnosis not present

## 2016-12-11 DIAGNOSIS — F4310 Post-traumatic stress disorder, unspecified: Secondary | ICD-10-CM | POA: Diagnosis not present

## 2016-12-13 DIAGNOSIS — F4310 Post-traumatic stress disorder, unspecified: Secondary | ICD-10-CM | POA: Diagnosis not present

## 2016-12-19 DIAGNOSIS — F329 Major depressive disorder, single episode, unspecified: Secondary | ICD-10-CM | POA: Diagnosis not present

## 2016-12-20 DIAGNOSIS — F4310 Post-traumatic stress disorder, unspecified: Secondary | ICD-10-CM | POA: Diagnosis not present

## 2017-01-01 DIAGNOSIS — F4310 Post-traumatic stress disorder, unspecified: Secondary | ICD-10-CM | POA: Diagnosis not present

## 2017-01-07 ENCOUNTER — Telehealth: Payer: Self-pay | Admitting: Physician Assistant

## 2017-01-07 DIAGNOSIS — F4310 Post-traumatic stress disorder, unspecified: Secondary | ICD-10-CM | POA: Diagnosis not present

## 2017-01-07 NOTE — Telephone Encounter (Signed)
Set her up for colonoscopy in [place] to evaluate ongoing rectal bleeding. Thanks

## 2017-01-07 NOTE — Telephone Encounter (Signed)
Patient seen by Nicoletta Ba, PA in May. She calls today because the rectal blood has continued. She sees "clots" of blood and occasional "spotting" of blood on the tissue after a bowel movement. She will see the blood when cleaning after urination at times. She does not have pain during or after the bowel movement. Please advise of the next step. She states a colonoscopy was discussed.

## 2017-01-07 NOTE — Telephone Encounter (Signed)
Patient advised and scheduled. Nurse visit 01/21/17 Colonoscopy 01/30/17

## 2017-01-08 DIAGNOSIS — F4310 Post-traumatic stress disorder, unspecified: Secondary | ICD-10-CM | POA: Diagnosis not present

## 2017-01-09 DIAGNOSIS — Z124 Encounter for screening for malignant neoplasm of cervix: Secondary | ICD-10-CM | POA: Diagnosis not present

## 2017-01-09 DIAGNOSIS — N951 Menopausal and female climacteric states: Secondary | ICD-10-CM | POA: Diagnosis not present

## 2017-01-10 DIAGNOSIS — F4310 Post-traumatic stress disorder, unspecified: Secondary | ICD-10-CM | POA: Diagnosis not present

## 2017-01-15 DIAGNOSIS — E7800 Pure hypercholesterolemia, unspecified: Secondary | ICD-10-CM | POA: Diagnosis not present

## 2017-01-15 DIAGNOSIS — R531 Weakness: Secondary | ICD-10-CM | POA: Diagnosis not present

## 2017-01-15 DIAGNOSIS — R110 Nausea: Secondary | ICD-10-CM | POA: Diagnosis not present

## 2017-01-15 DIAGNOSIS — R05 Cough: Secondary | ICD-10-CM | POA: Diagnosis not present

## 2017-01-17 DIAGNOSIS — F4310 Post-traumatic stress disorder, unspecified: Secondary | ICD-10-CM | POA: Diagnosis not present

## 2017-01-21 ENCOUNTER — Ambulatory Visit (AMBULATORY_SURGERY_CENTER): Payer: Self-pay

## 2017-01-21 VITALS — Ht 62.0 in | Wt 195.8 lb

## 2017-01-21 DIAGNOSIS — R109 Unspecified abdominal pain: Secondary | ICD-10-CM

## 2017-01-21 NOTE — Progress Notes (Signed)
No allergies to eggs but soy(swelling); has had Propofol many times without incident No diet meds No home oxygen No past problems with anesthesia  Declined emmi

## 2017-01-23 DIAGNOSIS — R197 Diarrhea, unspecified: Secondary | ICD-10-CM | POA: Diagnosis not present

## 2017-01-25 DIAGNOSIS — R197 Diarrhea, unspecified: Secondary | ICD-10-CM | POA: Diagnosis not present

## 2017-01-29 DIAGNOSIS — F4310 Post-traumatic stress disorder, unspecified: Secondary | ICD-10-CM | POA: Diagnosis not present

## 2017-01-30 ENCOUNTER — Ambulatory Visit (AMBULATORY_SURGERY_CENTER): Payer: Medicare Other | Admitting: Internal Medicine

## 2017-01-30 ENCOUNTER — Encounter: Payer: Self-pay | Admitting: Internal Medicine

## 2017-01-30 VITALS — BP 124/73 | HR 75 | Temp 98.4°F | Resp 13 | Ht 62.0 in | Wt 195.0 lb

## 2017-01-30 DIAGNOSIS — R110 Nausea: Secondary | ICD-10-CM

## 2017-01-30 DIAGNOSIS — R109 Unspecified abdominal pain: Secondary | ICD-10-CM | POA: Diagnosis not present

## 2017-01-30 DIAGNOSIS — K625 Hemorrhage of anus and rectum: Secondary | ICD-10-CM

## 2017-01-30 DIAGNOSIS — I10 Essential (primary) hypertension: Secondary | ICD-10-CM | POA: Diagnosis not present

## 2017-01-30 DIAGNOSIS — K219 Gastro-esophageal reflux disease without esophagitis: Secondary | ICD-10-CM | POA: Diagnosis not present

## 2017-01-30 DIAGNOSIS — J45909 Unspecified asthma, uncomplicated: Secondary | ICD-10-CM | POA: Diagnosis not present

## 2017-01-30 MED ORDER — SODIUM CHLORIDE 0.9 % IV SOLN: 500.0000 mL | INTRAVENOUS | Status: AC

## 2017-01-30 MED ORDER — ONDANSETRON HCL 40 MG/20ML IJ SOLN
4.0000 mg | Freq: Once | INTRAMUSCULAR | Status: AC
  Administered 2017-01-30: 4 mg via INTRAVENOUS

## 2017-01-30 NOTE — Progress Notes (Signed)
  [facility] Post-op Note  Patient: Tamara [name]  Procedure(s) Performed: colonoscopy  Patient Location: [facility] - Recovery Area  Anesthesia Type: Deep Sedation/Propofol  Level of Consciousness: awake, oriented and patient cooperative  Airway and Oxygen Therapy: Patient Spontanous Breathing  Post-op Pain: none  Post-op Assessment:  Post-op Vital signs reviewed, Patient's Cardiovascular Status Stable, Respiratory Function Stable, Patent Airway, No signs of Nausea or vomiting and Pain level controlled  Post-op Vital Signs: Reviewed and stable  Complications: No apparent anesthesia complications  Francely Craw E Lashawnta Burgert 4:07 PM

## 2017-01-30 NOTE — Progress Notes (Signed)
Pt's states no medical or surgical changes since previsit or office visit. 

## 2017-01-30 NOTE — Patient Instructions (Signed)
YOU HAD AN ENDOSCOPIC PROCEDURE TODAY AT THE [facility]:   Refer to the procedure report that was given to you for any specific questions about what was found during the examination.  If the procedure report does not answer your questions, please call your gastroenterologist to clarify.  If you requested that your care partner not be given the details of your procedure findings, then the procedure report has been included in a sealed envelope for you to review at your convenience later.  YOU SHOULD EXPECT: Some feelings of bloating in the abdomen. Passage of more gas than usual.  Walking can help get rid of the air that was put into your GI tract during the procedure and reduce the bloating. If you had a lower endoscopy (such as a colonoscopy or flexible sigmoidoscopy) you may notice spotting of blood in your stool or on the toilet paper. If you underwent a bowel prep for your procedure, you may not have a normal bowel movement for a few days.  Please Note:  You might notice some irritation and congestion in your nose or some drainage.  This is from the oxygen used during your procedure.  There is no need for concern and it should clear up in a day or so.  SYMPTOMS TO REPORT IMMEDIATELY:   Following lower endoscopy (colonoscopy or flexible sigmoidoscopy):  Excessive amounts of blood in the stool  Significant tenderness or worsening of abdominal pains  Swelling of the abdomen that is new, acute  Fever of 100F or higher  For urgent or emergent issues, a gastroenterologist can be reached at any hour by calling [phone].   DIET:  We do recommend a small meal at first, but then you may proceed to your regular diet.  Drink plenty of fluids but you should avoid alcoholic beverages for 24 hours.  MEDICATIONS: Continue present medications.  Please see handouts given to you by your recovery nurse.  ACTIVITY:  You should plan to take it easy for the rest of today and you should NOT  DRIVE or use heavy machinery until tomorrow (because of the sedation medicines used during the test).    FOLLOW UP: Our staff will call the number listed on your records the next business day following your procedure to check on you and address any questions or concerns that you may have regarding the information given to you following your procedure. If we do not reach you, we will leave a message.  However, if you are feeling well and you are not experiencing any problems, there is no need to return our call.  We will assume that you have returned to your regular daily activities without incident.  If any biopsies were taken you will be contacted by phone or by letter within the next 1-3 weeks.  Please call us at [phone] if you have not heard about the biopsies in 3 weeks.   Thank you for allowing us to provide for your healthcare needs today.   SIGNATURES/CONFIDENTIALITY: You and/or your care partner have signed paperwork which will be entered into your electronic medical record.  These signatures attest to the fact that that the information above on your After Visit Summary has been reviewed and is understood.  Full responsibility of the confidentiality of this discharge information lies with you and/or your care-partner. 

## 2017-01-30 NOTE — Op Note (Signed)
[facility] Patient Name: Tamara [name] Procedure Date: 01/30/2017 3:34 PM MRN: [id number] Endoscopist: Docia Chuck. Henrene Pastor , MD Age: 56 Referring MD:  Date of Birth: Jan 18, 1961 Gender: Female Account #: [id number] Procedure:                Colonoscopy Indications:              Abdominal pain, Diarrhea, Rectal bleeding. Negative                            exam 2014 except for diverticulosis and hemorrhoids Medicines:                Monitored Anesthesia Care Procedure:                Pre-Anesthesia Assessment:                           - Prior to the procedure, a History and Physical                            was performed, and patient medications and                            allergies were reviewed. The patient's tolerance of                            previous anesthesia was also reviewed. The risks                            and benefits of the procedure and the sedation                            options and risks were discussed with the patient.                            All questions were answered, and informed consent                            was obtained. Prior Anticoagulants: The patient has                            taken no previous anticoagulant or antiplatelet                            agents. ASA Grade Assessment: II - A patient with                            mild systemic disease. After reviewing the risks                            and benefits, the patient was deemed in                            satisfactory condition to undergo the procedure.  After obtaining informed consent, the colonoscope                            was passed under direct vision. Throughout the                            procedure, the patient's blood pressure, pulse, and                            oxygen saturations were monitored continuously. The                            Colonoscope was introduced through the anus and                            advanced to  the the cecum, identified by                            appendiceal orifice and ileocecal valve. The                            ileocecal valve, appendiceal orifice, and rectum                            were photographed. The quality of the bowel                            preparation was excellent. The colonoscopy was                            performed without difficulty. The patient tolerated                            the procedure well. The bowel preparation used was                            SUPREP. Scope In: 3:45:40 PM Scope Out: 3:58:05 PM Scope Withdrawal Time: 0 hours 7 minutes 42 seconds  Total Procedure Duration: 0 hours 12 minutes 25 seconds  Findings:                 Multiple diverticula were found in the left colon                            and right colon. There was rectosigmoid stenosis.                           Internal hemorrhoids were found during retroflexion.                           The exam was otherwise without abnormality on                            direct and retroflexion views. Complications:  No immediate complications. Estimated blood loss:                            None. Estimated Blood Loss:     Estimated blood loss: none. Impression:               - Diverticulosis in the left colon and in the right                            colon.                           - Internal hemorrhoids.                           - The examination was otherwise normal on direct                            and retroflexion views.                           - No specimens collected. Recommendation:           - Repeat colonoscopy in 10 years for screening                            purposes.                           - Patient has a contact number available for                            emergencies. The signs and symptoms of potential                            delayed complications were discussed with the                            patient. Return to normal  activities tomorrow.                            Written discharge instructions were provided to the                            patient.                           - Resume previous diet.                           - Continue present medications. Docia Chuck. Henrene Pastor, MD 01/30/2017 4:22:04 PM This report has been signed electronically.

## 2017-01-31 ENCOUNTER — Telehealth: Payer: Self-pay

## 2017-01-31 DIAGNOSIS — F4310 Post-traumatic stress disorder, unspecified: Secondary | ICD-10-CM | POA: Diagnosis not present

## 2017-01-31 NOTE — Telephone Encounter (Signed)
  Follow up Call-  Call back number 01/30/2017  Post procedure Call Back phone  # [phone]  Permission to leave phone message Yes  Some recent data might be hidden     Patient questions:  Do you have a fever, pain , or abdominal swelling? No. Pain Score  0 *  Have you tolerated food without any problems? Yes.    Have you been able to return to your normal activities? Yes.    Do you have any questions about your discharge instructions: Diet   No. Medications  No. Follow up visit  No.  Do you have questions or concerns about your Care? No.  Actions: * If pain score is 4 or above: No action needed, pain <4.

## 2017-02-05 DIAGNOSIS — M9902 Segmental and somatic dysfunction of thoracic region: Secondary | ICD-10-CM | POA: Diagnosis not present

## 2017-02-05 DIAGNOSIS — M9901 Segmental and somatic dysfunction of cervical region: Secondary | ICD-10-CM | POA: Diagnosis not present

## 2017-02-05 DIAGNOSIS — M5417 Radiculopathy, lumbosacral region: Secondary | ICD-10-CM | POA: Diagnosis not present

## 2017-02-05 DIAGNOSIS — M9903 Segmental and somatic dysfunction of lumbar region: Secondary | ICD-10-CM | POA: Diagnosis not present

## 2017-02-05 DIAGNOSIS — M5414 Radiculopathy, thoracic region: Secondary | ICD-10-CM | POA: Diagnosis not present

## 2017-02-05 DIAGNOSIS — M5032 Other cervical disc degeneration, mid-cervical region, unspecified level: Secondary | ICD-10-CM | POA: Diagnosis not present

## 2017-02-07 DIAGNOSIS — M5414 Radiculopathy, thoracic region: Secondary | ICD-10-CM | POA: Diagnosis not present

## 2017-02-07 DIAGNOSIS — M9902 Segmental and somatic dysfunction of thoracic region: Secondary | ICD-10-CM | POA: Diagnosis not present

## 2017-02-07 DIAGNOSIS — M9901 Segmental and somatic dysfunction of cervical region: Secondary | ICD-10-CM | POA: Diagnosis not present

## 2017-02-07 DIAGNOSIS — M5032 Other cervical disc degeneration, mid-cervical region, unspecified level: Secondary | ICD-10-CM | POA: Diagnosis not present

## 2017-02-07 DIAGNOSIS — M5417 Radiculopathy, lumbosacral region: Secondary | ICD-10-CM | POA: Diagnosis not present

## 2017-02-07 DIAGNOSIS — M9903 Segmental and somatic dysfunction of lumbar region: Secondary | ICD-10-CM | POA: Diagnosis not present

## 2017-02-07 DIAGNOSIS — F4310 Post-traumatic stress disorder, unspecified: Secondary | ICD-10-CM | POA: Diagnosis not present

## 2017-02-14 DIAGNOSIS — M9902 Segmental and somatic dysfunction of thoracic region: Secondary | ICD-10-CM | POA: Diagnosis not present

## 2017-02-14 DIAGNOSIS — M5032 Other cervical disc degeneration, mid-cervical region, unspecified level: Secondary | ICD-10-CM | POA: Diagnosis not present

## 2017-02-14 DIAGNOSIS — M9903 Segmental and somatic dysfunction of lumbar region: Secondary | ICD-10-CM | POA: Diagnosis not present

## 2017-02-14 DIAGNOSIS — M5414 Radiculopathy, thoracic region: Secondary | ICD-10-CM | POA: Diagnosis not present

## 2017-02-14 DIAGNOSIS — F4310 Post-traumatic stress disorder, unspecified: Secondary | ICD-10-CM | POA: Diagnosis not present

## 2017-02-14 DIAGNOSIS — M5417 Radiculopathy, lumbosacral region: Secondary | ICD-10-CM | POA: Diagnosis not present

## 2017-02-14 DIAGNOSIS — M9901 Segmental and somatic dysfunction of cervical region: Secondary | ICD-10-CM | POA: Diagnosis not present

## 2017-02-18 DIAGNOSIS — M5417 Radiculopathy, lumbosacral region: Secondary | ICD-10-CM | POA: Diagnosis not present

## 2017-02-18 DIAGNOSIS — M9903 Segmental and somatic dysfunction of lumbar region: Secondary | ICD-10-CM | POA: Diagnosis not present

## 2017-02-18 DIAGNOSIS — M9901 Segmental and somatic dysfunction of cervical region: Secondary | ICD-10-CM | POA: Diagnosis not present

## 2017-02-18 DIAGNOSIS — M9902 Segmental and somatic dysfunction of thoracic region: Secondary | ICD-10-CM | POA: Diagnosis not present

## 2017-02-18 DIAGNOSIS — M5032 Other cervical disc degeneration, mid-cervical region, unspecified level: Secondary | ICD-10-CM | POA: Diagnosis not present

## 2017-02-18 DIAGNOSIS — M5414 Radiculopathy, thoracic region: Secondary | ICD-10-CM | POA: Diagnosis not present

## 2017-02-19 DIAGNOSIS — F4310 Post-traumatic stress disorder, unspecified: Secondary | ICD-10-CM | POA: Diagnosis not present

## 2017-02-21 DIAGNOSIS — F4310 Post-traumatic stress disorder, unspecified: Secondary | ICD-10-CM | POA: Diagnosis not present

## 2017-02-21 DIAGNOSIS — M5032 Other cervical disc degeneration, mid-cervical region, unspecified level: Secondary | ICD-10-CM | POA: Diagnosis not present

## 2017-02-21 DIAGNOSIS — M5414 Radiculopathy, thoracic region: Secondary | ICD-10-CM | POA: Diagnosis not present

## 2017-02-21 DIAGNOSIS — M5417 Radiculopathy, lumbosacral region: Secondary | ICD-10-CM | POA: Diagnosis not present

## 2017-02-21 DIAGNOSIS — M9902 Segmental and somatic dysfunction of thoracic region: Secondary | ICD-10-CM | POA: Diagnosis not present

## 2017-02-21 DIAGNOSIS — M9901 Segmental and somatic dysfunction of cervical region: Secondary | ICD-10-CM | POA: Diagnosis not present

## 2017-02-21 DIAGNOSIS — M9903 Segmental and somatic dysfunction of lumbar region: Secondary | ICD-10-CM | POA: Diagnosis not present

## 2017-02-22 ENCOUNTER — Encounter: Payer: Self-pay | Admitting: Cardiovascular Disease

## 2017-02-22 ENCOUNTER — Ambulatory Visit (INDEPENDENT_AMBULATORY_CARE_PROVIDER_SITE_OTHER): Payer: Medicare Other | Admitting: Cardiovascular Disease

## 2017-02-22 VITALS — BP 130/92 | HR 90 | Ht 62.0 in | Wt 193.0 lb

## 2017-02-22 DIAGNOSIS — R072 Precordial pain: Secondary | ICD-10-CM | POA: Diagnosis not present

## 2017-02-22 DIAGNOSIS — E785 Hyperlipidemia, unspecified: Secondary | ICD-10-CM

## 2017-02-22 DIAGNOSIS — I10 Essential (primary) hypertension: Secondary | ICD-10-CM | POA: Diagnosis not present

## 2017-02-22 MED ORDER — NITROGLYCERIN 0.4 MG SL SUBL: 0.4000 mg | SUBLINGUAL_TABLET | SUBLINGUAL | 3 refills | Status: DC | PRN

## 2017-02-22 NOTE — Patient Instructions (Signed)
Medication Instructions: Dr Sallyanne Kuster has recommended making the following medication changes: 1. USE Nitroglycerin as needed. See information below.  Labwork: Your physician recommends that you return for lab work at your earliest [place].  Testing/Procedures: 1. [place] Stress Test - Your physician has requested that you have a lexiscan myoview. For further information please visit [email or web address]. Please follow instruction sheet, as given.  Follow-up: Your physician recommends that you schedule a follow-up appointment after the stress test with a PA/NP.  Dr Sallyanne Kuster recommends that you schedule a follow-up appointment in 12 months. You will receive a reminder letter in the mail two months in advance. If you don't receive a letter, please call our office to schedule the follow-up appointment.  If you need a refill on your cardiac medications before your next appointment, please call your pharmacy.   Nitroglycerin sublingual tablets What is this medicine? NITROGLYCERIN (nye troe GLI ser in) is a type of vasodilator. It relaxes blood vessels, increasing the blood and oxygen supply to your heart. This medicine is used to relieve chest pain caused by angina. It is also used to prevent chest pain before activities like climbing stairs, going outdoors in cold weather, or sexual activity. This medicine may be used for other purposes; ask your health care provider or pharmacist if you have questions. COMMON BRAND NAME(S): Nitroquick, Nitrostat, Nitrotab What should I tell my health care provider before I take this medicine? They need to know if you have any of these conditions: -anemia -head injury, recent stroke, or bleeding in the brain -liver disease -previous heart attack -an unusual or allergic reaction to nitroglycerin, other medicines, foods, dyes, or preservatives -pregnant or trying to get pregnant -breast-feeding How should I use this medicine? Take  this medicine by mouth as needed. At the first sign of an angina attack (chest pain or tightness) place one tablet under your tongue. You can also take this medicine 5 to 10 minutes before an event likely to produce chest pain. Follow the directions on the prescription label. Let the tablet dissolve under the tongue. Do not swallow whole. Replace the dose if you accidentally swallow it. It will help if your mouth is not dry. Saliva around the tablet will help it to dissolve more quickly. Do not eat or drink, smoke or chew tobacco while a tablet is dissolving. If you are not better within 5 minutes after taking ONE dose of nitroglycerin, call 9-1-1 immediately to seek emergency medical care. Do not take more than 3 nitroglycerin tablets over 15 minutes. If you take this medicine often to relieve symptoms of angina, your doctor or health care professional may provide you with different instructions to manage your symptoms. If symptoms do not go away after following these instructions, it is important to call 9-1-1 immediately. Do not take more than 3 nitroglycerin tablets over 15 minutes. Talk to your pediatrician regarding the use of this medicine in children. Special care may be needed. Overdosage: If you think you have taken too much of this medicine contact a poison control center or emergency room at once. NOTE: This medicine is only for you. Do not share this medicine with others. What if I miss a dose? This does not apply. This medicine is only used as needed. What may interact with this medicine? Do not take this medicine with any of the following medications: -certain migraine medicines like ergotamine and dihydroergotamine (DHE) -medicines used to treat erectile dysfunction like sildenafil, tadalafil, and vardenafil -riociguat This medicine may  also interact with the following medications: -alteplase -aspirin -heparin -medicines for high blood pressure -medicines for mental  depression -other medicines used to treat angina -phenothiazines like chlorpromazine, mesoridazine, prochlorperazine, thioridazine This list may not describe all possible interactions. Give your health care provider a list of all the medicines, herbs, non-prescription drugs, or dietary supplements you use. Also tell them if you smoke, drink alcohol, or use illegal drugs. Some items may interact with your medicine. What should I watch for while using this medicine? Tell your doctor or health care professional if you feel your medicine is no longer working. Keep this medicine with you at all times. Sit or lie down when you take your medicine to prevent falling if you feel dizzy or faint after using it. Try to remain calm. This will help you to feel better faster. If you feel dizzy, take several deep breaths and lie down with your feet propped up, or bend forward with your head resting between your knees. You may get drowsy or dizzy. Do not drive, use machinery, or do anything that needs mental alertness until you know how this drug affects you. Do not stand or sit up quickly, especially if you are an older patient. This reduces the risk of dizzy or fainting spells. Alcohol can make you more drowsy and dizzy. Avoid alcoholic drinks. Do not treat yourself for coughs, colds, or pain while you are taking this medicine without asking your doctor or health care professional for advice. Some ingredients may increase your blood pressure. What side effects may I notice from receiving this medicine? Side effects that you should report to your doctor or health care professional as soon as possible: -blurred vision -dry mouth -skin rash -sweating -the feeling of extreme pressure in the head -unusually weak or tired Side effects that usually do not require medical attention (report to your doctor or health care professional if they continue or are bothersome): -flushing of the face or neck -headache -irregular  heartbeat, palpitations -nausea, vomiting This list may not describe all possible side effects. Call your doctor for medical advice about side effects. You may report side effects to FDA at 1-800-FDA-1088. Where should I keep my medicine? Keep out of the reach of children. Store at room temperature between 20 and 25 degrees C (68 and 77 degrees F). Store in [employer]. Protect from light and moisture. Keep tightly closed. Throw away any unused medicine after the expiration date. NOTE: This sheet is a summary. It may not cover all possible information. If you have questions about this medicine, talk to your doctor, pharmacist, or health care provider.  2018 [employer] Standard (2013-04-16 17:57:36)
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
## 2017-02-23 LAB — LIPID PANEL
CHOLESTEROL TOTAL: 244 mg/dL — AB (ref 100–199)
Chol/HDL Ratio: 5.2 ratio — ABNORMAL HIGH (ref 0.0–4.4)
HDL: 47 mg/dL (ref >39)
LDL Calculated: 164 mg/dL — ABNORMAL HIGH (ref 0–99)
TRIGLYCERIDES: 166 mg/dL — AB (ref 0–149)
VLDL Cholesterol Cal: 33 mg/dL (ref 5–40)

## 2017-02-26 ENCOUNTER — Telehealth (HOSPITAL_COMMUNITY): Payer: Self-pay

## 2017-02-26 NOTE — Telephone Encounter (Signed)
Encounter complete. 

## 2017-02-26 NOTE — Telephone Encounter (Signed)
No call

## 2017-02-27 ENCOUNTER — Ambulatory Visit (HOSPITAL_COMMUNITY)
Admission: RE | Admit: 2017-02-27 | Discharge: 2017-02-27 | Disposition: A | Discharge status: Home / Self Care | Payer: Medicare Other | Source: Ambulatory Visit | Attending: Cardiology | Admitting: Cardiology

## 2017-02-27 DIAGNOSIS — R072 Precordial pain: Secondary | ICD-10-CM | POA: Diagnosis not present

## 2017-02-27 LAB — MYOCARDIAL PERFUSION IMAGING
CHL CUP NUCLEAR SSS: 3
CHL CUP RESTING HR STRESS: 75 {beats}/min
CSEPPHR: 118 {beats}/min
LV sys vol: 10 mL
LVDIAVOL: 53 mL (ref 46–106)
SDS: 0
SRS: 3
TID: 1.2

## 2017-02-27 MED ORDER — REGADENOSON 0.4 MG/5ML IV SOLN
0.4000 mg | Freq: Once | INTRAVENOUS | Status: AC
  Administered 2017-02-27: 0.4 mg via INTRAVENOUS

## 2017-02-27 MED ORDER — TECHNETIUM TC 99M TETROFOSMIN IV KIT
9.7000 | PACK | Freq: Once | INTRAVENOUS | Status: AC | PRN
  Administered 2017-02-27: 9.7 via INTRAVENOUS
  Filled 2017-02-27: qty 10

## 2017-02-27 MED ORDER — TECHNETIUM TC 99M TETROFOSMIN IV KIT
31.5000 | PACK | Freq: Once | INTRAVENOUS | Status: AC | PRN
  Administered 2017-02-27: 31.5 via INTRAVENOUS
  Filled 2017-02-27: qty 32

## 2017-02-27 MED ORDER — AMINOPHYLLINE 25 MG/ML IV SOLN
75.0000 mg | Freq: Once | INTRAVENOUS | Status: AC
  Administered 2017-02-27: 75 mg via INTRAVENOUS

## 2017-02-28 ENCOUNTER — Telehealth: Payer: Self-pay | Admitting: *Deleted

## 2017-02-28 NOTE — Telephone Encounter (Signed)
Spoke with pt, she is aware of results. She is not willing to take any statins and she also wants it noted that she is on medicare part d and disability and can not afford expensive medications. She has a follow up in September and will discuss further at that time.

## 2017-02-28 NOTE — Telephone Encounter (Addendum)
-----   Message from Sanda Klein, MD sent at 02/27/2017  5:39 PM EDT ----- Normal nuclear stress test, images very reassuring  Notes recorded by Sanda Klein, MD on 02/23/2017 at 11:32 AM EDT Cholesterol numbers are worse than they've been in the last 3 years. Really need to give another try at medications. If the stress test is abnormal, this will be even more important.   Left message for pt to call to discuss nuclear stress test and labs.

## 2017-03-05 DIAGNOSIS — F4310 Post-traumatic stress disorder, unspecified: Secondary | ICD-10-CM | POA: Diagnosis not present

## 2017-03-07 DIAGNOSIS — F4310 Post-traumatic stress disorder, unspecified: Secondary | ICD-10-CM | POA: Diagnosis not present

## 2017-03-08 DIAGNOSIS — J1000 Influenza due to other identified influenza virus with unspecified type of pneumonia: Secondary | ICD-10-CM | POA: Diagnosis not present

## 2017-03-08 DIAGNOSIS — R05 Cough: Secondary | ICD-10-CM | POA: Diagnosis not present

## 2017-03-20 DIAGNOSIS — R05 Cough: Secondary | ICD-10-CM | POA: Diagnosis not present

## 2017-03-20 DIAGNOSIS — J069 Acute upper respiratory infection, unspecified: Secondary | ICD-10-CM | POA: Diagnosis not present

## 2017-03-25 ENCOUNTER — Ambulatory Visit: Payer: Medicare Other | Admitting: Physician Assistant

## 2017-03-25 DIAGNOSIS — F329 Major depressive disorder, single episode, unspecified: Secondary | ICD-10-CM | POA: Diagnosis not present

## 2017-03-25 NOTE — Progress Notes (Deleted)
Cardiology Office Note   Date:  03/25/2017   ID:  Jeraldin, Fesler 25-Aug-1960, MRN [id number]  PCP:  Jilda Panda, MD  Cardiologist:  Dr. Sallyanne Kuster, 02/22/2017  Barrett, Suanne Marker, PA-C   No chief complaint on file.   History of Present Illness: Tamara [name] is a 56 y.o. female with a history of HLD (intol statins), OA, GERD, HTN, HLD, PTSD, FH CAD normal echo 2015, ?TIA 2015  02/22/2017, office visit with patient complaining of chest tightness>>MV ordered and was normal, pt on fenofibrate, refuses statins, has tried simvastatin, atorvastatin, pravastatin and rosuvastatin   Tamara [name] presents for ***   Past Medical History:  Diagnosis Date  . Allergy   . Anemia   . Anxiety   . Asthma   . Depression   . Diverticulitis   . GERD (gastroesophageal reflux disease)   . History of short term memory loss   . HTN (hypertension)   . Hyperlipemia   . Neuromuscular disorder ([place])   . PTSD (post-traumatic stress disorder)   . Shock therapy as cause of abnormal reaction of patient or of later complication without mention of misadventure at time of procedure     Past Surgical History:  Procedure Laterality Date  . ABDOMINAL HYSTERECTOMY     partial  . CHOLECYSTECTOMY    . COLONOSCOPY    . OTHER SURGICAL HISTORY     electric shock therapy  . TUBAL LIGATION      Current Outpatient Prescriptions  Medication Sig Dispense Refill  . albuterol (PROVENTIL HFA;VENTOLIN HFA) 108 (90 Base) MCG/ACT inhaler Inhale 2 puffs into the lungs every 6 (six) hours as needed for wheezing or shortness of breath. 1 Inhaler 3  . ALPRAZolam (XANAX XR) 1 MG 24 hr tablet Take 1 mg by mouth 2 (two) times daily.     Marland Kitchen ALPRAZolam (XANAX) 1 MG tablet Take 1 tablet (1 mg total) by mouth daily as needed for anxiety. 30 tablet 3  . aspirin 325 MG tablet Take 1 tablet (325 mg total) by mouth daily. 30 tablet 0  . baclofen (LIORESAL) 10 MG tablet Take 1 tablet (10 mg total) by mouth 3 (three)  times daily. 30 each 11  . Biotin 5000 MCG CAPS Take 1 capsule by mouth daily.    . Chlorophyll 100 MG TABS Take 1 tablet by mouth daily.    . Cholecalciferol (VITAMIN D-3) 1000 UNITS CAPS Take 1 capsule by mouth daily.    . Cholecalciferol (VITAMIN D3) 50000 UNITS CAPS Take 50,000 Units by mouth once a week. 12 capsule 2  . dexlansoprazole (DEXILANT) 60 MG capsule Take 1 capsule (60 mg total) by mouth daily. 90 capsule 3  . diclofenac sodium (VOLTAREN) 1 % GEL Apply 2 g topically daily as needed (for pain). 100 g 3  . doxycycline (VIBRA-TABS) 100 MG tablet Take 1 tablet (100 mg total) by mouth 2 (two) times daily. 180 tablet 3  . fenofibrate (TRICOR) 145 MG tablet Take 1 tablet (145 mg total) by mouth daily. 90 tablet 0  . Green Tea, Camillia sinensis, (GREEN TEA EXTRACT PO) Take 400 mg by mouth daily.    Marland Kitchen losartan (COZAAR) 100 MG tablet Take 1 tablet (100 mg total) by mouth daily. 90 tablet 3  . LYSINE PO Take 1 tablet by mouth as needed. prn    . MAGNESIUM GLYCINATE PLUS PO Take 400 mg by mouth daily. migraine    . nitroGLYCERIN (NITROSTAT) 0.4 MG SL tablet Place 1  tablet (0.4 mg total) under the tongue every 5 (five) minutes as needed for chest pain. 25 tablet 3  . NON FORMULARY R-lipoic acid NA-rala 100mg  qd For glucose control    . Oxcarbazepine (TRILEPTAL) 300 MG tablet Take 300 mg by mouth daily.    Marland Kitchen perphenazine (TRILAFON) 4 MG tablet Take 4 mg by mouth daily as needed.     . Riboflavin 400 MG CAPS Take 1 capsule by mouth daily.    . Taurine 1000 MG CAPS Take 1 capsule by mouth daily.    . temazepam (RESTORIL) 15 MG capsule     . vitamin C (ASCORBIC ACID) 500 MG tablet Take 500 mg by mouth daily.     Current Facility-Administered Medications  Medication Dose Route Frequency Provider Last Rate Last Dose  . 0.9 %  sodium chloride infusion  500 mL Intravenous Continuous Irene Shipper, MD        Allergies:   Statins; Adhesive [tape]; Darvon [propoxyphene]; Gabapentin; Ivp dye  [iodinated diagnostic agents]; Levaquin [levofloxacin in d5w]; Levaquin [levofloxacin]; Oxycodone; Oxycontin [oxycodone hcl]; Propoxyphene hcl; Seroquel [quetiapine fumerate]; Penicillins; and Sulfonamide derivatives    Social History:  The patient  reports that she has never smoked. She has never used smokeless tobacco. She reports that she does not drink alcohol or use drugs.   Family History:  The patient's family history includes Arrhythmia in her father; Cancer in her mother; Colon cancer in her paternal uncle; Coronary artery disease (age of onset: 68) in her maternal grandfather; Diabetes in her daughter and paternal grandfather; Hearing loss in her paternal grandfather and paternal grandmother; Heart disease in her father and maternal grandfather; Hyperlipidemia in her brother, father, maternal grandmother, and mother; Hypertension in her father, maternal grandfather, and mother; Stroke in her father.    ROS:  Please see the history of present illness. All other systems are reviewed and negative.    PHYSICAL EXAM: VS:  There were no vitals taken for this visit. , BMI There is no height or weight on file to calculate BMI. GEN: Well nourished, well developed, female in no acute distress  HEENT: normal for age  Neck: no JVD, no carotid bruit, no masses Cardiac: RRR; no murmur, no rubs, or gallops Respiratory:  clear to auscultation bilaterally, normal work of breathing GI: soft, nontender, nondistended, + BS MS: no deformity or atrophy; no edema; distal pulses are 2+ in all 4 extremities   Skin: warm and dry, no rash Neuro:  Strength and sensation are intact Psych: euthymic mood, full affect   EKG:  EKG {ACTION; IS/IS [id number]} ordered today. The ekg ordered today demonstrates ***  MYOVIEW: 02/27/2017   Nuclear stress EF: 81%. The left ventricular ejection fraction is hyperdynamic (>65%).  The study is normal. No evidence of ischemia. No evidence of previous MI  This is a  low risk study.   Recent Labs: 11/09/2016: BUN 16; Creatinine, Ser 1.16; Potassium 3.9; Sodium 140    Lipid Panel    Component Value Date/Time   CHOL 244 (H) 02/22/2017 1247   TRIG 166 (H) 02/22/2017 1247   HDL 47 02/22/2017 1247   CHOLHDL 5.2 (H) 02/22/2017 1247   CHOLHDL 4.0 10/05/2015 1540   VLDL 35 (H) 10/05/2015 1540   LDLCALC 164 (H) 02/22/2017 1247   LDLDIRECT 94.2 01/12/2010 0000     Wt Readings from Last 3 Encounters:  02/27/17 193 lb (87.5 kg)  02/22/17 193 lb (87.5 kg)  01/30/17 195 lb (88.5 kg)  Other studies Reviewed: Additional studies/ records that were reviewed today include: ***.  ASSESSMENT AND PLAN:  1.  ***   Current medicines are reviewed at length with the patient today.  The patient {ACTIONS; HAS/DOES NOT HAVE:19233} concerns regarding medicines.  The following changes have been made:  {PLAN; NO CHANGE:13088:s}  Labs/ tests ordered today include: *** No orders of the defined types were placed in this encounter.    Disposition:   FU with Dr. Sallyanne Kuster  Signed, Lenoard Aden  03/25/2017 8:18 AM    [place] Phone: [phone]; Fax: [phone]  This note was written with the assistance of speech recognition software. Please excuse any transcriptional errors.

## 2017-03-26 DIAGNOSIS — F4310 Post-traumatic stress disorder, unspecified: Secondary | ICD-10-CM | POA: Diagnosis not present

## 2017-03-28 DIAGNOSIS — F4310 Post-traumatic stress disorder, unspecified: Secondary | ICD-10-CM | POA: Diagnosis not present

## 2017-04-05 ENCOUNTER — Encounter: Payer: Self-pay | Admitting: Physician Assistant

## 2017-04-05 ENCOUNTER — Ambulatory Visit (INDEPENDENT_AMBULATORY_CARE_PROVIDER_SITE_OTHER): Payer: Medicare Other | Admitting: Physician Assistant

## 2017-04-05 ENCOUNTER — Encounter (INDEPENDENT_AMBULATORY_CARE_PROVIDER_SITE_OTHER): Payer: Self-pay

## 2017-04-05 VITALS — BP 126/90 | HR 99 | Ht 62.0 in | Wt 196.0 lb

## 2017-04-05 DIAGNOSIS — I10 Essential (primary) hypertension: Secondary | ICD-10-CM | POA: Diagnosis not present

## 2017-04-05 DIAGNOSIS — E7801 Familial hypercholesterolemia: Secondary | ICD-10-CM

## 2017-04-05 DIAGNOSIS — R072 Precordial pain: Secondary | ICD-10-CM | POA: Diagnosis not present
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
## 2017-04-05 NOTE — Patient Instructions (Addendum)
Medication Instructions:  NO CHANGES If you need a refill on your cardiac medications before your next appointment, please call your pharmacy.  Follow-Up: Your physician wants you to follow-up in: [place]. You should receive a reminder letter in the mail two months in advance. If you do not receive a letter, please call our office FEB 2019 to schedule the April 2019 follow-up appointment.  Special Instructions: MAKE SURE TO FOLLOW LOW FAT DIET WITH 10% FAT OR LESS FAT  10g SATURATED FAT AND 20 TOTAL FAT GRAMS DAILY  LOOK INTO CHOLESTEROL MEDICATION [place] AND LET us KNOW IF YOU WOULD LIKE TO START THIS MEDICATION   Thank you for choosing [facility] at [facility]!!

## 2017-04-09 DIAGNOSIS — F4310 Post-traumatic stress disorder, unspecified: Secondary | ICD-10-CM | POA: Diagnosis not present

## 2017-04-11 DIAGNOSIS — F4310 Post-traumatic stress disorder, unspecified: Secondary | ICD-10-CM | POA: Diagnosis not present

## 2017-04-18 DIAGNOSIS — F4310 Post-traumatic stress disorder, unspecified: Secondary | ICD-10-CM | POA: Diagnosis not present

## 2017-04-23 DIAGNOSIS — F4310 Post-traumatic stress disorder, unspecified: Secondary | ICD-10-CM | POA: Diagnosis not present

## 2017-04-25 DIAGNOSIS — F4310 Post-traumatic stress disorder, unspecified: Secondary | ICD-10-CM | POA: Diagnosis not present

## 2017-04-25 DIAGNOSIS — Z23 Encounter for immunization: Secondary | ICD-10-CM | POA: Diagnosis not present

## 2017-04-30 DIAGNOSIS — F4310 Post-traumatic stress disorder, unspecified: Secondary | ICD-10-CM | POA: Diagnosis not present

## 2017-05-02 DIAGNOSIS — F4310 Post-traumatic stress disorder, unspecified: Secondary | ICD-10-CM | POA: Diagnosis not present

## 2017-05-02 DIAGNOSIS — M5417 Radiculopathy, lumbosacral region: Secondary | ICD-10-CM | POA: Diagnosis not present

## 2017-05-02 DIAGNOSIS — M9903 Segmental and somatic dysfunction of lumbar region: Secondary | ICD-10-CM | POA: Diagnosis not present

## 2017-05-02 DIAGNOSIS — M5032 Other cervical disc degeneration, mid-cervical region, unspecified level: Secondary | ICD-10-CM | POA: Diagnosis not present

## 2017-05-02 DIAGNOSIS — M9901 Segmental and somatic dysfunction of cervical region: Secondary | ICD-10-CM | POA: Diagnosis not present

## 2017-05-02 DIAGNOSIS — M9902 Segmental and somatic dysfunction of thoracic region: Secondary | ICD-10-CM | POA: Diagnosis not present

## 2017-05-02 DIAGNOSIS — M5414 Radiculopathy, thoracic region: Secondary | ICD-10-CM | POA: Diagnosis not present

## 2017-05-13 DIAGNOSIS — M9901 Segmental and somatic dysfunction of cervical region: Secondary | ICD-10-CM | POA: Diagnosis not present

## 2017-05-13 DIAGNOSIS — M5032 Other cervical disc degeneration, mid-cervical region, unspecified level: Secondary | ICD-10-CM | POA: Diagnosis not present

## 2017-05-13 DIAGNOSIS — M5414 Radiculopathy, thoracic region: Secondary | ICD-10-CM | POA: Diagnosis not present

## 2017-05-13 DIAGNOSIS — M9903 Segmental and somatic dysfunction of lumbar region: Secondary | ICD-10-CM | POA: Diagnosis not present

## 2017-05-13 DIAGNOSIS — M9902 Segmental and somatic dysfunction of thoracic region: Secondary | ICD-10-CM | POA: Diagnosis not present

## 2017-05-13 DIAGNOSIS — M5417 Radiculopathy, lumbosacral region: Secondary | ICD-10-CM | POA: Diagnosis not present

## 2017-05-14 DIAGNOSIS — F4310 Post-traumatic stress disorder, unspecified: Secondary | ICD-10-CM | POA: Diagnosis not present

## 2017-05-15 DIAGNOSIS — M5032 Other cervical disc degeneration, mid-cervical region, unspecified level: Secondary | ICD-10-CM | POA: Diagnosis not present

## 2017-05-15 DIAGNOSIS — M9903 Segmental and somatic dysfunction of lumbar region: Secondary | ICD-10-CM | POA: Diagnosis not present

## 2017-05-15 DIAGNOSIS — M5417 Radiculopathy, lumbosacral region: Secondary | ICD-10-CM | POA: Diagnosis not present

## 2017-05-15 DIAGNOSIS — M9901 Segmental and somatic dysfunction of cervical region: Secondary | ICD-10-CM | POA: Diagnosis not present

## 2017-05-15 DIAGNOSIS — M9902 Segmental and somatic dysfunction of thoracic region: Secondary | ICD-10-CM | POA: Diagnosis not present

## 2017-05-15 DIAGNOSIS — M5414 Radiculopathy, thoracic region: Secondary | ICD-10-CM | POA: Diagnosis not present

## 2017-05-16 DIAGNOSIS — F4310 Post-traumatic stress disorder, unspecified: Secondary | ICD-10-CM | POA: Diagnosis not present

## 2017-05-17 DIAGNOSIS — M9903 Segmental and somatic dysfunction of lumbar region: Secondary | ICD-10-CM | POA: Diagnosis not present

## 2017-05-17 DIAGNOSIS — M5414 Radiculopathy, thoracic region: Secondary | ICD-10-CM | POA: Diagnosis not present

## 2017-05-17 DIAGNOSIS — M9902 Segmental and somatic dysfunction of thoracic region: Secondary | ICD-10-CM | POA: Diagnosis not present

## 2017-05-17 DIAGNOSIS — M9901 Segmental and somatic dysfunction of cervical region: Secondary | ICD-10-CM | POA: Diagnosis not present

## 2017-05-17 DIAGNOSIS — M5417 Radiculopathy, lumbosacral region: Secondary | ICD-10-CM | POA: Diagnosis not present

## 2017-05-17 DIAGNOSIS — M5032 Other cervical disc degeneration, mid-cervical region, unspecified level: Secondary | ICD-10-CM | POA: Diagnosis not present

## 2017-05-20 DIAGNOSIS — M5414 Radiculopathy, thoracic region: Secondary | ICD-10-CM | POA: Diagnosis not present

## 2017-05-20 DIAGNOSIS — F329 Major depressive disorder, single episode, unspecified: Secondary | ICD-10-CM | POA: Diagnosis not present

## 2017-05-20 DIAGNOSIS — M9902 Segmental and somatic dysfunction of thoracic region: Secondary | ICD-10-CM | POA: Diagnosis not present

## 2017-05-20 DIAGNOSIS — M5032 Other cervical disc degeneration, mid-cervical region, unspecified level: Secondary | ICD-10-CM | POA: Diagnosis not present

## 2017-05-20 DIAGNOSIS — M9903 Segmental and somatic dysfunction of lumbar region: Secondary | ICD-10-CM | POA: Diagnosis not present

## 2017-05-20 DIAGNOSIS — M9901 Segmental and somatic dysfunction of cervical region: Secondary | ICD-10-CM | POA: Diagnosis not present

## 2017-05-20 DIAGNOSIS — M5417 Radiculopathy, lumbosacral region: Secondary | ICD-10-CM | POA: Diagnosis not present

## 2017-05-21 DIAGNOSIS — F4310 Post-traumatic stress disorder, unspecified: Secondary | ICD-10-CM | POA: Diagnosis not present

## 2017-05-22 DIAGNOSIS — M9903 Segmental and somatic dysfunction of lumbar region: Secondary | ICD-10-CM | POA: Diagnosis not present

## 2017-05-22 DIAGNOSIS — M5417 Radiculopathy, lumbosacral region: Secondary | ICD-10-CM | POA: Diagnosis not present

## 2017-05-22 DIAGNOSIS — M5032 Other cervical disc degeneration, mid-cervical region, unspecified level: Secondary | ICD-10-CM | POA: Diagnosis not present

## 2017-05-22 DIAGNOSIS — M5414 Radiculopathy, thoracic region: Secondary | ICD-10-CM | POA: Diagnosis not present

## 2017-05-22 DIAGNOSIS — M9901 Segmental and somatic dysfunction of cervical region: Secondary | ICD-10-CM | POA: Diagnosis not present

## 2017-05-22 DIAGNOSIS — M9902 Segmental and somatic dysfunction of thoracic region: Secondary | ICD-10-CM | POA: Diagnosis not present

## 2017-05-27 DIAGNOSIS — M5414 Radiculopathy, thoracic region: Secondary | ICD-10-CM | POA: Diagnosis not present

## 2017-05-27 DIAGNOSIS — M9901 Segmental and somatic dysfunction of cervical region: Secondary | ICD-10-CM | POA: Diagnosis not present

## 2017-05-27 DIAGNOSIS — M5032 Other cervical disc degeneration, mid-cervical region, unspecified level: Secondary | ICD-10-CM | POA: Diagnosis not present

## 2017-05-27 DIAGNOSIS — M9902 Segmental and somatic dysfunction of thoracic region: Secondary | ICD-10-CM | POA: Diagnosis not present

## 2017-05-27 DIAGNOSIS — M5417 Radiculopathy, lumbosacral region: Secondary | ICD-10-CM | POA: Diagnosis not present

## 2017-05-27 DIAGNOSIS — M9903 Segmental and somatic dysfunction of lumbar region: Secondary | ICD-10-CM | POA: Diagnosis not present

## 2017-05-28 DIAGNOSIS — F4310 Post-traumatic stress disorder, unspecified: Secondary | ICD-10-CM | POA: Diagnosis not present

## 2017-05-29 DIAGNOSIS — M9901 Segmental and somatic dysfunction of cervical region: Secondary | ICD-10-CM | POA: Diagnosis not present

## 2017-05-29 DIAGNOSIS — M9903 Segmental and somatic dysfunction of lumbar region: Secondary | ICD-10-CM | POA: Diagnosis not present

## 2017-05-29 DIAGNOSIS — M5417 Radiculopathy, lumbosacral region: Secondary | ICD-10-CM | POA: Diagnosis not present

## 2017-05-29 DIAGNOSIS — M9902 Segmental and somatic dysfunction of thoracic region: Secondary | ICD-10-CM | POA: Diagnosis not present

## 2017-05-29 DIAGNOSIS — M5032 Other cervical disc degeneration, mid-cervical region, unspecified level: Secondary | ICD-10-CM | POA: Diagnosis not present

## 2017-05-29 DIAGNOSIS — M5414 Radiculopathy, thoracic region: Secondary | ICD-10-CM | POA: Diagnosis not present

## 2017-05-30 DIAGNOSIS — F4310 Post-traumatic stress disorder, unspecified: Secondary | ICD-10-CM | POA: Diagnosis not present

## 2017-05-31 DIAGNOSIS — M9903 Segmental and somatic dysfunction of lumbar region: Secondary | ICD-10-CM | POA: Diagnosis not present

## 2017-05-31 DIAGNOSIS — M9902 Segmental and somatic dysfunction of thoracic region: Secondary | ICD-10-CM | POA: Diagnosis not present

## 2017-05-31 DIAGNOSIS — M5414 Radiculopathy, thoracic region: Secondary | ICD-10-CM | POA: Diagnosis not present

## 2017-05-31 DIAGNOSIS — M5417 Radiculopathy, lumbosacral region: Secondary | ICD-10-CM | POA: Diagnosis not present

## 2017-05-31 DIAGNOSIS — M9901 Segmental and somatic dysfunction of cervical region: Secondary | ICD-10-CM | POA: Diagnosis not present

## 2017-05-31 DIAGNOSIS — M5032 Other cervical disc degeneration, mid-cervical region, unspecified level: Secondary | ICD-10-CM | POA: Diagnosis not present

## 2017-06-03 ENCOUNTER — Other Ambulatory Visit: Payer: Self-pay | Admitting: Internal Medicine

## 2017-06-03 ENCOUNTER — Ambulatory Visit
Admission: RE | Admit: 2017-06-03 | Discharge: 2017-06-03 | Disposition: A | Discharge status: Home / Self Care | Payer: Medicare Other | Source: Ambulatory Visit | Attending: Internal Medicine | Admitting: Internal Medicine

## 2017-06-03 DIAGNOSIS — M545 Low back pain: Secondary | ICD-10-CM

## 2017-06-03 DIAGNOSIS — R7301 Impaired fasting glucose: Secondary | ICD-10-CM | POA: Diagnosis not present

## 2017-06-03 DIAGNOSIS — I119 Hypertensive heart disease without heart failure: Secondary | ICD-10-CM | POA: Diagnosis not present

## 2017-06-03 DIAGNOSIS — M25552 Pain in left hip: Secondary | ICD-10-CM | POA: Diagnosis not present

## 2017-06-03 DIAGNOSIS — E7800 Pure hypercholesterolemia, unspecified: Secondary | ICD-10-CM | POA: Diagnosis not present

## 2017-06-04 DIAGNOSIS — M5032 Other cervical disc degeneration, mid-cervical region, unspecified level: Secondary | ICD-10-CM | POA: Diagnosis not present

## 2017-06-04 DIAGNOSIS — M5414 Radiculopathy, thoracic region: Secondary | ICD-10-CM | POA: Diagnosis not present

## 2017-06-04 DIAGNOSIS — F4310 Post-traumatic stress disorder, unspecified: Secondary | ICD-10-CM | POA: Diagnosis not present

## 2017-06-04 DIAGNOSIS — M9901 Segmental and somatic dysfunction of cervical region: Secondary | ICD-10-CM | POA: Diagnosis not present

## 2017-06-04 DIAGNOSIS — M9903 Segmental and somatic dysfunction of lumbar region: Secondary | ICD-10-CM | POA: Diagnosis not present

## 2017-06-04 DIAGNOSIS — M9902 Segmental and somatic dysfunction of thoracic region: Secondary | ICD-10-CM | POA: Diagnosis not present

## 2017-06-04 DIAGNOSIS — M5417 Radiculopathy, lumbosacral region: Secondary | ICD-10-CM | POA: Diagnosis not present

## 2017-06-06 DIAGNOSIS — M9902 Segmental and somatic dysfunction of thoracic region: Secondary | ICD-10-CM | POA: Diagnosis not present

## 2017-06-06 DIAGNOSIS — F4310 Post-traumatic stress disorder, unspecified: Secondary | ICD-10-CM | POA: Diagnosis not present

## 2017-06-06 DIAGNOSIS — M9903 Segmental and somatic dysfunction of lumbar region: Secondary | ICD-10-CM | POA: Diagnosis not present

## 2017-06-06 DIAGNOSIS — M5417 Radiculopathy, lumbosacral region: Secondary | ICD-10-CM | POA: Diagnosis not present

## 2017-06-06 DIAGNOSIS — M9901 Segmental and somatic dysfunction of cervical region: Secondary | ICD-10-CM | POA: Diagnosis not present

## 2017-06-06 DIAGNOSIS — M5032 Other cervical disc degeneration, mid-cervical region, unspecified level: Secondary | ICD-10-CM | POA: Diagnosis not present

## 2017-06-06 DIAGNOSIS — M5414 Radiculopathy, thoracic region: Secondary | ICD-10-CM | POA: Diagnosis not present

## 2017-06-07 ENCOUNTER — Encounter: Payer: Self-pay | Admitting: Nurse Practitioner

## 2017-06-07 ENCOUNTER — Other Ambulatory Visit (INDEPENDENT_AMBULATORY_CARE_PROVIDER_SITE_OTHER): Payer: Medicare Other

## 2017-06-07 ENCOUNTER — Ambulatory Visit (INDEPENDENT_AMBULATORY_CARE_PROVIDER_SITE_OTHER): Payer: Medicare Other | Admitting: Nurse Practitioner

## 2017-06-07 VITALS — BP 126/84 | HR 87 | Ht 62.0 in | Wt 195.0 lb

## 2017-06-07 DIAGNOSIS — R1013 Epigastric pain: Secondary | ICD-10-CM | POA: Diagnosis not present

## 2017-06-07 DIAGNOSIS — R110 Nausea: Secondary | ICD-10-CM | POA: Diagnosis not present

## 2017-06-07 DIAGNOSIS — R109 Unspecified abdominal pain: Secondary | ICD-10-CM

## 2017-06-07 LAB — CBC WITH DIFFERENTIAL/PLATELET
BASOS ABS: 0.2 10*3/uL — AB (ref 0.0–0.1)
Basophils Relative: 2.3 % (ref 0.0–3.0)
EOS PCT: 3.4 % (ref 0.0–5.0)
Eosinophils Absolute: 0.3 10*3/uL (ref 0.0–0.7)
HCT: 41.6 % (ref 36.0–46.0)
HEMOGLOBIN: 14 g/dL (ref 12.0–15.0)
LYMPHS ABS: 4.1 10*3/uL — AB (ref 0.7–4.0)
LYMPHS PCT: 53.3 % — AB (ref 12.0–46.0)
MCHC: 33.6 g/dL (ref 30.0–36.0)
MCV: 86.6 fl (ref 78.0–100.0)
Monocytes Absolute: 0.5 10*3/uL (ref 0.1–1.0)
Monocytes Relative: 6 % (ref 3.0–12.0)
NEUTROS PCT: 35 % — AB (ref 43.0–77.0)
Neutro Abs: 2.7 10*3/uL (ref 1.4–7.7)
Platelets: 337 10*3/uL (ref 150.0–400.0)
RBC: 4.81 Mil/uL (ref 3.87–5.11)
RDW: 13.2 % (ref 11.5–15.5)
WBC: 7.6 10*3/uL (ref 4.0–10.5)

## 2017-06-07 LAB — COMPREHENSIVE METABOLIC PANEL
ALBUMIN: 4.4 g/dL (ref 3.5–5.2)
ALK PHOS: 73 U/L (ref 39–117)
ALT: 14 U/L (ref 0–35)
AST: 17 U/L (ref 0–37)
BUN: 18 mg/dL (ref 6–23)
CALCIUM: 10.2 mg/dL (ref 8.4–10.5)
CO2: 28 mEq/L (ref 19–32)
Chloride: 104 mEq/L (ref 96–112)
Creatinine, Ser: 1.22 mg/dL — ABNORMAL HIGH (ref 0.40–1.20)
GFR: 48.59 mL/min — AB (ref >60.00)
GLUCOSE: 90 mg/dL (ref 70–99)
POTASSIUM: 4.1 meq/L (ref 3.5–5.1)
Sodium: 139 mEq/L (ref 135–145)
Total Bilirubin: 0.8 mg/dL (ref 0.2–1.2)
Total Protein: 7.2 g/dL (ref 6.0–8.3)

## 2017-06-07 LAB — LIPASE: Lipase: 47 U/L (ref 11.0–59.0)

## 2017-06-07 MED ORDER — RANITIDINE HCL 150 MG PO TABS: 150.0000 mg | ORAL_TABLET | Freq: Every day | ORAL | 3 refills | Status: DC

## 2017-06-07 NOTE — Progress Notes (Signed)
     Chief Complaint:   Abdominal pain and nausea  HPI: Patient is a 56 year old female with a history of hypertension, CVA, GERD, migraines, anxiety and depression. known to Dr. Henrene Pastor. She was last evaluated in May of this year for abdominal pain, diarrhea and rectal bleeding. The diarrhea and bleeding had resolved by the time we saw her. The symptoms had started following what was felt to be an acute allergic reaction to Seroquel. Patient had also had some periorbital edema after taking the medication. There was concern for possible angioedema of her bowel versus segmental ischemic colitis and so a CT of the abdomen and pelvis was obtained. A moderate hiatal hernia was found, nothing acute.  Tamara [name] comes in with "major stomach issues". A month ago she started having non-radiating epigastric pain, worse with eating. Switched to bland foods including toast, crackers and eggs. This has helped but still hurts and started having nausea three days ago. No vomiting but very bloated and having associated generalized pressure. [place] Scrape like she looks 9 months pregnant. Taking a lot of tums, gas-x. Alka-setzer chews and mylanta. Sleeps on a wedge and not having any regurgitation. Despite all the sx she has not lost any weight. She is taking once daily Dexilant in addition to above listed agents . She takes a 325 mg aspirin daily, no other NSAIDs. Her bowels are moving okay   Past Medical History:  Diagnosis Date  . Allergy   . Anemia   . Anxiety   . Asthma   . Depression   . Diverticulitis   . GERD (gastroesophageal reflux disease)   . History of short term memory loss   . HTN (hypertension)   . Hyperlipemia   . Neuromuscular disorder ([place])   . PTSD (post-traumatic stress disorder)   . Shock therapy as cause of abnormal reaction of patient or of later complication without mention of misadventure at time of procedure     Patient's surgical history, family medical history, social history, medications  and allergies were all reviewed in Epic    Physical Exam: BP 126/84   Pulse 87   Ht 5\' 2"  (1.575 m)   Wt 195 lb (88.5 kg)   BMI 35.67 kg/m   GENERAL:  Well developed white female in NAD PSYCH: :Pleasant, cooperative, normal affect EENT:  conjunctiva pink, mucous membranes moist, neck supple without masses CARDIAC:  RRR, no peripheral edema PULM: Normal respiratory effort, lungs CTA bilaterally, no wheezing ABDOMEN:  Nondistended, soft, nontender. No obvious masses, no  normal bowel sounds SKIN:  turgor, no lesions seen Musculoskeletal:  Normal muscle tone, normal strength NEURO: Alert and oriented x 3, no focal neurologic deficits    ASSESSMENT and PLAN:  Pleasant 56 year old female with one month hx of non-radiating epigastric pain, worse with food and new onset nausea. Symptoms refractory to daily PPI in addition to multiple OTC medications such as Tums, Alka-Setzer, etc... -for further evaluation patient will be scheduled for EGD. The risks and benefits of EGD were discussed and the patient agrees to proceed.  -continue am Dexilants, add Zantac Q HS -CBC CMET, lipase  2.  Multiple medication allergies   Tamara [name], , NP 06/07/2017, 11:33 AM

## 2017-06-07 NOTE — Patient Instructions (Signed)
If you are age 55 or older, your body mass index should be between 23-30. Your Body mass index is 35.67 kg/m. If this is out of the aforementioned range listed, please consider follow up with your Primary Care Provider.  If you are age 25 or younger, your body mass index should be between 19-25. Your Body mass index is 35.67 kg/m. If this is out of the aformentioned range listed, please consider follow up with your Primary Care Provider.   You have been scheduled for an endoscopy. Please follow written instructions given to you at your visit today. If you use inhalers (even only as needed), please bring them with you on the day of your procedure. Your physician has requested that you go to www.startemmi.com and enter the access code given to you at your visit today. This web site gives a general overview about your procedure. However, you should still follow specific instructions given to you by our office regarding your preparation for the procedure.  Your physician has requested that you go to the basement for lab work before leaving today.  We have sent the following medications to your pharmacy for you to pick up at your convenience: Zantac 150 mg  Thank you for choosing me and [facility].   Tye Savoy, NP

## 2017-06-10 ENCOUNTER — Encounter: Payer: Self-pay | Admitting: Nurse Practitioner

## 2017-06-11 ENCOUNTER — Encounter: Payer: Medicare Other | Admitting: Internal Medicine

## 2017-06-11 NOTE — Progress Notes (Signed)
Assessment and plans reviewed  

## 2017-06-13 DIAGNOSIS — M5414 Radiculopathy, thoracic region: Secondary | ICD-10-CM | POA: Diagnosis not present

## 2017-06-13 DIAGNOSIS — M5417 Radiculopathy, lumbosacral region: Secondary | ICD-10-CM | POA: Diagnosis not present

## 2017-06-13 DIAGNOSIS — M9902 Segmental and somatic dysfunction of thoracic region: Secondary | ICD-10-CM | POA: Diagnosis not present

## 2017-06-13 DIAGNOSIS — M5032 Other cervical disc degeneration, mid-cervical region, unspecified level: Secondary | ICD-10-CM | POA: Diagnosis not present

## 2017-06-13 DIAGNOSIS — M9901 Segmental and somatic dysfunction of cervical region: Secondary | ICD-10-CM | POA: Diagnosis not present

## 2017-06-13 DIAGNOSIS — M9903 Segmental and somatic dysfunction of lumbar region: Secondary | ICD-10-CM | POA: Diagnosis not present

## 2017-06-17 DIAGNOSIS — M5414 Radiculopathy, thoracic region: Secondary | ICD-10-CM | POA: Diagnosis not present

## 2017-06-17 DIAGNOSIS — M9902 Segmental and somatic dysfunction of thoracic region: Secondary | ICD-10-CM | POA: Diagnosis not present

## 2017-06-17 DIAGNOSIS — M5032 Other cervical disc degeneration, mid-cervical region, unspecified level: Secondary | ICD-10-CM | POA: Diagnosis not present

## 2017-06-17 DIAGNOSIS — M9903 Segmental and somatic dysfunction of lumbar region: Secondary | ICD-10-CM | POA: Diagnosis not present

## 2017-06-17 DIAGNOSIS — M9901 Segmental and somatic dysfunction of cervical region: Secondary | ICD-10-CM | POA: Diagnosis not present

## 2017-06-17 DIAGNOSIS — M5417 Radiculopathy, lumbosacral region: Secondary | ICD-10-CM | POA: Diagnosis not present

## 2017-06-18 DIAGNOSIS — F4310 Post-traumatic stress disorder, unspecified: Secondary | ICD-10-CM | POA: Diagnosis not present

## 2017-06-20 ENCOUNTER — Telehealth: Payer: Self-pay | Admitting: Internal Medicine

## 2017-06-20 ENCOUNTER — Encounter: Payer: Self-pay | Admitting: Internal Medicine

## 2017-06-20 ENCOUNTER — Ambulatory Visit (AMBULATORY_SURGERY_CENTER): Payer: Medicare Other | Admitting: Internal Medicine

## 2017-06-20 ENCOUNTER — Other Ambulatory Visit: Payer: Self-pay

## 2017-06-20 VITALS — BP 121/91 | HR 82 | Temp 97.8°F | Resp 17 | Ht 62.0 in | Wt 195.0 lb

## 2017-06-20 DIAGNOSIS — K219 Gastro-esophageal reflux disease without esophagitis: Secondary | ICD-10-CM

## 2017-06-20 DIAGNOSIS — R109 Unspecified abdominal pain: Secondary | ICD-10-CM | POA: Diagnosis present

## 2017-06-20 DIAGNOSIS — R110 Nausea: Secondary | ICD-10-CM | POA: Diagnosis not present

## 2017-06-20 DIAGNOSIS — K2970 Gastritis, unspecified, without bleeding: Secondary | ICD-10-CM | POA: Diagnosis not present

## 2017-06-20 DIAGNOSIS — K2990 Gastroduodenitis, unspecified, without bleeding: Secondary | ICD-10-CM

## 2017-06-20 DIAGNOSIS — R112 Nausea with vomiting, unspecified: Secondary | ICD-10-CM | POA: Diagnosis not present

## 2017-06-20 LAB — HELICOBACTER PYLORI SCREEN-BIOPSY: UREASE: NEGATIVE

## 2017-06-20 MED ORDER — SODIUM CHLORIDE 0.9 % IV SOLN: 500.0000 mL | INTRAVENOUS | Status: DC

## 2017-06-20 NOTE — Telephone Encounter (Signed)
Patient requesting paperwork about gastritises be sent in the mail due to not getting it after procedure from today 12.20.18

## 2017-06-20 NOTE — Op Note (Signed)
[place] Patient Name: Tamara [name] Procedure Date: 06/20/2017 11:39 AM MRN: [id number] Endoscopist: Docia Chuck. Henrene Pastor , MD Age: 56 Referring MD:  Date of Birth: December 07, 1960 Gender: Female Account #: [id number] Procedure:                Upper GI endoscopy, With biopsy Indications:              Epigastric abdominal pain, Nausea. Better since                            recent office visit since adding H2 receptor                            antagonist at night Medicines:                Monitored Anesthesia Care Procedure:                Pre-Anesthesia Assessment:                           - Prior to the procedure, a History and Physical                            was performed, and patient medications and                            allergies were reviewed. The patient's tolerance of                            previous anesthesia was also reviewed. The risks                            and benefits of the procedure and the sedation                            options and risks were discussed with the patient.                            All questions were answered, and informed consent                            was obtained. Prior Anticoagulants: The patient has                            taken no previous anticoagulant or antiplatelet                            agents. ASA Grade Assessment: II - A patient with                            mild systemic disease. After reviewing the risks                            and benefits, the patient was deemed in  satisfactory condition to undergo the procedure.                           After obtaining informed consent, the endoscope was                            passed under direct vision. Throughout the                            procedure, the patient's blood pressure, pulse, and                            oxygen saturations were monitored continuously. The                            Endoscope was introduced  through the mouth, and                            advanced to the second part of duodenum. The upper                            GI endoscopy was accomplished without difficulty.                            The patient tolerated the procedure well. Scope In: Scope Out: Findings:                 The esophagus revealed a large caliber ring but was                            otherwise normal. No active inflammation.                           The stomach Revealed a sliding hiatal hernia.                           The examined duodenum was normal.                           The cardia and gastric fundus were normal on                            retroflexion.                           Localized mild inflammation characterized by                            erythema was found in the gastric antrum. Biopsies                            were taken with a cold forceps for Helicobacter                            pylori testing using CLOtest. Complications:  No immediate complications. Estimated Blood Loss:     Estimated blood loss: none. Impression:               1. GERD                           2. Large caliber distal esophageal ring                           3.Hiatal hernia                           4. Mild antral gastritis status post CLO test Recommendation:           - Patient has a contact number available for                            emergencies. The signs and symptoms of potential                            delayed complications were discussed with the                            patient. Return to normal activities tomorrow.                            Written discharge instructions were provided to the                            patient.                           - Reflux precautions.                           - Continue present medications.                           - Routine office follow-up in 2 months John N. Henrene Pastor, MD 06/20/2017 12:03:54 PM This report has been signed  electronically.

## 2017-06-20 NOTE — Patient Instructions (Signed)
YOU HAD AN ENDOSCOPIC PROCEDURE TODAY AT [facility]:   Refer to the procedure report that was given to you for any specific questions about what was found during the examination.  If the procedure report does not answer your questions, please call your gastroenterologist to clarify.  If you requested that your care partner not be given the details of your procedure findings, then the procedure report has been included in a sealed envelope for you to review at your convenience later.  YOU SHOULD EXPECT: Some feelings of bloating in the abdomen. Passage of more gas than usual.  Walking can help get rid of the air that was put into your GI tract during the procedure and reduce the bloating. If you had a lower endoscopy (such as a colonoscopy or flexible sigmoidoscopy) you may notice spotting of blood in your stool or on the toilet paper. If you underwent a bowel prep for your procedure, you may not have a normal bowel movement for a few days.  Please Note:  You might notice some irritation and congestion in your nose or some drainage.  This is from the oxygen used during your procedure.  There is no need for concern and it should clear up in a day or so.  SYMPTOMS TO REPORT IMMEDIATELY:    Following upper endoscopy (EGD)  Vomiting of blood or coffee ground material  New chest pain or pain under the shoulder blades  Painful or persistently difficult swallowing  New shortness of breath  Fever of 100F or higher  Black, tarry-looking stools  For urgent or emergent issues, a gastroenterologist can be reached at any hour by calling [phone].   DIET:  We do recommend a small meal at first, but then you may proceed to your regular diet.  Drink plenty of fluids but you should avoid alcoholic beverages for 24 hours.  ACTIVITY:  You should plan to take it easy for the rest of today and you should NOT DRIVE or use heavy machinery until tomorrow (because of the sedation medicines used  during the test).    FOLLOW UP: Our staff will call the number listed on your records the next business day following your procedure to check on you and address any questions or concerns that you may have regarding the information given to you following your procedure. If we do not reach you, we will leave a message.  However, if you are feeling well and you are not experiencing any problems, there is no need to return our call.  We will assume that you have returned to your regular daily activities without incident.  If any biopsies were taken you will be contacted by phone or by letter within the next 1-3 weeks.  Please call us at [phone] if you have not heard about the biopsies in 3 weeks.    SIGNATURES/CONFIDENTIALITY: You and/or your care partner have signed paperwork which will be entered into your electronic medical record.  These signatures attest to the fact that that the information above on your After Visit Summary has been reviewed and is understood.  Full responsibility of the confidentiality of this discharge information lies with you and/or your care-partner.   Handouts were given to your care partner on a GERD with reflux precautions, hiatal hernia, and gastritis. You may resume your current medications today. Await biopsy results. Routine office follow up in 2 months.  The office staff will call to set up this appointment and call you with  appointment date and time. Please call if any questions or concerns.

## 2017-06-20 NOTE — Progress Notes (Signed)
A and O x3. Report to RN. Tolerated MAC anesthesia well.Teeth unchanged after procedure.

## 2017-06-20 NOTE — Progress Notes (Signed)
Called to room to assist during endoscopic procedure.  Patient ID and intended procedure confirmed with present staff. Received instructions for my participation in the procedure from the performing physician.  

## 2017-06-20 NOTE — Progress Notes (Signed)
No problems noted in the recovery room. maw 

## 2017-06-21 ENCOUNTER — Telehealth: Payer: Self-pay

## 2017-06-21 DIAGNOSIS — M5417 Radiculopathy, lumbosacral region: Secondary | ICD-10-CM | POA: Diagnosis not present

## 2017-06-21 DIAGNOSIS — M5414 Radiculopathy, thoracic region: Secondary | ICD-10-CM | POA: Diagnosis not present

## 2017-06-21 DIAGNOSIS — F329 Major depressive disorder, single episode, unspecified: Secondary | ICD-10-CM | POA: Diagnosis not present

## 2017-06-21 DIAGNOSIS — M5032 Other cervical disc degeneration, mid-cervical region, unspecified level: Secondary | ICD-10-CM | POA: Diagnosis not present

## 2017-06-21 DIAGNOSIS — M9903 Segmental and somatic dysfunction of lumbar region: Secondary | ICD-10-CM | POA: Diagnosis not present

## 2017-06-21 DIAGNOSIS — M9902 Segmental and somatic dysfunction of thoracic region: Secondary | ICD-10-CM | POA: Diagnosis not present

## 2017-06-21 DIAGNOSIS — M9901 Segmental and somatic dysfunction of cervical region: Secondary | ICD-10-CM | POA: Diagnosis not present

## 2017-06-21 NOTE — Telephone Encounter (Signed)
  Follow up Call-  Call back number 06/20/2017 01/30/2017  Post procedure Call Back phone  # [phone]  Permission to leave phone message Yes Yes  Some recent data might be hidden     Patient questions:  Do you have a fever, pain , or abdominal swelling? Yes.   Pain Score  3 * "Same as before"  Have you tolerated food without any problems? Yes.    Have you been able to return to your normal activities? Yes.    Do you have any questions about your discharge instructions: Diet   No. Medications  No. Follow up visit  No.  Do you have questions or concerns about your Care? No.  Actions: * If pain score is 4 or above: No action needed, pain <4.

## 2017-06-21 NOTE — Telephone Encounter (Signed)
Attempted to reach pt. With follow up call following endoscopic procedure 06/20/2017.  LM on pt. Ans. Machine.  Will try to reach pt. Again later today.

## 2017-06-21 NOTE — Telephone Encounter (Signed)
Mailing Gastritis and GERD handouts to patient

## 2017-06-26 DIAGNOSIS — M9901 Segmental and somatic dysfunction of cervical region: Secondary | ICD-10-CM | POA: Diagnosis not present

## 2017-06-26 DIAGNOSIS — M5414 Radiculopathy, thoracic region: Secondary | ICD-10-CM | POA: Diagnosis not present

## 2017-06-26 DIAGNOSIS — M5417 Radiculopathy, lumbosacral region: Secondary | ICD-10-CM | POA: Diagnosis not present

## 2017-06-26 DIAGNOSIS — M5032 Other cervical disc degeneration, mid-cervical region, unspecified level: Secondary | ICD-10-CM | POA: Diagnosis not present

## 2017-06-26 DIAGNOSIS — M9903 Segmental and somatic dysfunction of lumbar region: Secondary | ICD-10-CM | POA: Diagnosis not present

## 2017-06-26 DIAGNOSIS — M9902 Segmental and somatic dysfunction of thoracic region: Secondary | ICD-10-CM | POA: Diagnosis not present

## 2017-06-27 DIAGNOSIS — F4310 Post-traumatic stress disorder, unspecified: Secondary | ICD-10-CM | POA: Diagnosis not present

## 2017-06-28 DIAGNOSIS — M5414 Radiculopathy, thoracic region: Secondary | ICD-10-CM | POA: Diagnosis not present

## 2017-06-28 DIAGNOSIS — M5417 Radiculopathy, lumbosacral region: Secondary | ICD-10-CM | POA: Diagnosis not present

## 2017-06-28 DIAGNOSIS — M9901 Segmental and somatic dysfunction of cervical region: Secondary | ICD-10-CM | POA: Diagnosis not present

## 2017-06-28 DIAGNOSIS — M9902 Segmental and somatic dysfunction of thoracic region: Secondary | ICD-10-CM | POA: Diagnosis not present

## 2017-06-28 DIAGNOSIS — M9903 Segmental and somatic dysfunction of lumbar region: Secondary | ICD-10-CM | POA: Diagnosis not present

## 2017-06-28 DIAGNOSIS — M5032 Other cervical disc degeneration, mid-cervical region, unspecified level: Secondary | ICD-10-CM | POA: Diagnosis not present

## 2017-06-29 DIAGNOSIS — F4310 Post-traumatic stress disorder, unspecified: Secondary | ICD-10-CM | POA: Diagnosis not present

## 2017-07-04 DIAGNOSIS — M545 Low back pain: Secondary | ICD-10-CM | POA: Diagnosis not present

## 2017-07-04 DIAGNOSIS — F4310 Post-traumatic stress disorder, unspecified: Secondary | ICD-10-CM | POA: Diagnosis not present

## 2017-07-08 DIAGNOSIS — M545 Low back pain: Secondary | ICD-10-CM | POA: Diagnosis not present

## 2017-07-09 DIAGNOSIS — F4310 Post-traumatic stress disorder, unspecified: Secondary | ICD-10-CM | POA: Diagnosis not present

## 2017-07-10 DIAGNOSIS — M545 Low back pain: Secondary | ICD-10-CM | POA: Diagnosis not present

## 2017-07-11 DIAGNOSIS — F4310 Post-traumatic stress disorder, unspecified: Secondary | ICD-10-CM | POA: Diagnosis not present

## 2017-07-15 DIAGNOSIS — M545 Low back pain: Secondary | ICD-10-CM | POA: Diagnosis not present

## 2017-07-22 DIAGNOSIS — M545 Low back pain: Secondary | ICD-10-CM | POA: Diagnosis not present

## 2017-07-23 DIAGNOSIS — F4310 Post-traumatic stress disorder, unspecified: Secondary | ICD-10-CM | POA: Diagnosis not present

## 2017-07-30 ENCOUNTER — Telehealth: Payer: Self-pay | Admitting: Internal Medicine

## 2017-07-30 MED ORDER — RANITIDINE HCL 150 MG PO TABS: 150.0000 mg | ORAL_TABLET | Freq: Every day | ORAL | 3 refills | Status: DC

## 2017-07-30 NOTE — Telephone Encounter (Signed)
Zantac refilled.

## 2017-07-31 DIAGNOSIS — M545 Low back pain: Secondary | ICD-10-CM | POA: Diagnosis not present

## 2017-08-01 DIAGNOSIS — F4310 Post-traumatic stress disorder, unspecified: Secondary | ICD-10-CM | POA: Diagnosis not present

## 2017-08-02 DIAGNOSIS — M545 Low back pain: Secondary | ICD-10-CM | POA: Diagnosis not present

## 2017-08-06 DIAGNOSIS — F4310 Post-traumatic stress disorder, unspecified: Secondary | ICD-10-CM | POA: Diagnosis not present

## 2017-08-07 DIAGNOSIS — M545 Low back pain: Secondary | ICD-10-CM | POA: Diagnosis not present

## 2017-08-08 DIAGNOSIS — F4310 Post-traumatic stress disorder, unspecified: Secondary | ICD-10-CM | POA: Diagnosis not present

## 2017-08-09 DIAGNOSIS — M545 Low back pain: Secondary | ICD-10-CM | POA: Diagnosis not present

## 2017-08-13 DIAGNOSIS — F4310 Post-traumatic stress disorder, unspecified: Secondary | ICD-10-CM | POA: Diagnosis not present

## 2017-08-14 DIAGNOSIS — M545 Low back pain: Secondary | ICD-10-CM | POA: Diagnosis not present

## 2017-08-16 DIAGNOSIS — M545 Low back pain: Secondary | ICD-10-CM | POA: Diagnosis not present

## 2017-08-20 DIAGNOSIS — F4310 Post-traumatic stress disorder, unspecified: Secondary | ICD-10-CM | POA: Diagnosis not present

## 2017-08-21 DIAGNOSIS — F4310 Post-traumatic stress disorder, unspecified: Secondary | ICD-10-CM | POA: Diagnosis not present

## 2017-08-22 ENCOUNTER — Encounter: Payer: Self-pay | Admitting: Internal Medicine

## 2017-08-22 ENCOUNTER — Ambulatory Visit (INDEPENDENT_AMBULATORY_CARE_PROVIDER_SITE_OTHER): Payer: Medicare Other | Admitting: Internal Medicine

## 2017-08-22 ENCOUNTER — Encounter (INDEPENDENT_AMBULATORY_CARE_PROVIDER_SITE_OTHER): Payer: Self-pay

## 2017-08-22 VITALS — BP 112/78 | HR 72 | Wt 198.0 lb

## 2017-08-22 DIAGNOSIS — R140 Abdominal distension (gaseous): Secondary | ICD-10-CM

## 2017-08-22 DIAGNOSIS — R1013 Epigastric pain: Secondary | ICD-10-CM

## 2017-08-22 DIAGNOSIS — R6881 Early satiety: Secondary | ICD-10-CM | POA: Diagnosis not present

## 2017-08-22 MED ORDER — DICYCLOMINE HCL 20 MG PO TABS: ORAL_TABLET | ORAL | 11 refills | Status: DC

## 2017-08-22 NOTE — Progress Notes (Signed)
HISTORY OF PRESENT ILLNESS:  Tamara [name] is a 57 y.o. female with past medical history as listed below. She presents today for follow-up regarding epigastric pain. I have seen the patient previously for colonoscopy in 2014 which was negative except for diverticulosis and hemorrhoids. She underwent colonoscopy again in 01/30/2017 to evaluate abdominal pain, diarrhea, and rectal bleeding. Examination was negative except for diverticulosis and internal hemorrhoids. She was then seen in the office by the GI physician assistant 06/07/2017 regarding epigastric pain not responding to PPI. Unremarkable blood work including serum lipase and liver tests. She is status post cholecystectomy. She was set up for upper endoscopy which was performed 06/20/2017. She was found to have incidental distal esophageal ring and hiatal hernia. Mild gastritis. Testing for Helicobacter pylori was negative. CT scan of the abdomen and pelvis without contrast was performed 11/12/2016. This was negative except for fatty liver, diverticulosis, and hiatal hernia. Patient tells me that since her endoscopy she continues with the same symptoms. She describes postprandial fullness in the morning and at lunch time. After her evening meal, if she eats later than 4:30, she experiences epigastric pain as an effective by dull pressure. Tends to last the remainder of the night. No nausea or vomiting. She continues on Dexilant and at night ranitidine for GERD. No active GERD symptoms. No dysphagia. She also reports bloating. She has been exercising less than usual due to issues with her depression. She has gained weight which disturbs her. She is planning a [place] vacation in mid May  Tamara [name]:  All non-GI ROS negative except for sinus and allergy, anxiety, back pain, depression, excessive thirst, sleeping problems  Past Medical History:  Diagnosis Date  . Allergy   . Anemia   . Anxiety   . Asthma   . Depression   .  Diverticulitis   . GERD (gastroesophageal reflux disease)   . History of short term memory loss   . HTN (hypertension)   . Hyperlipemia   . Neuromuscular disorder ([place])   . PTSD (post-traumatic stress disorder)   . Shock therapy as cause of abnormal reaction of patient or of later complication without mention of misadventure at time of procedure     Past Surgical History:  Procedure Laterality Date  . ABDOMINAL HYSTERECTOMY     partial  . CHOLECYSTECTOMY    . COLONOSCOPY    . OTHER SURGICAL HISTORY     electric shock therapy  . TUBAL LIGATION    . UPPER GASTROINTESTINAL ENDOSCOPY      Social History Tamara [name]  reports that  has never smoked. she has never used smokeless tobacco. She reports that she does not drink alcohol or use drugs.  family history includes Arrhythmia in her father; Cancer in her mother; Colon cancer in her paternal uncle; Coronary artery disease (age of onset: 75) in her maternal grandfather; Diabetes in her daughter and paternal grandfather; Hearing loss in her paternal grandfather and paternal grandmother; Heart disease in her father and maternal grandfather; Hyperlipidemia in her brother, father, maternal grandmother, and mother; Hypertension in her father, maternal grandfather, and mother; Stroke in her father.  Allergies  Allergen Reactions  . Statins Other (See Comments)    Severe mm pain (esp. Arms) >> DOES NOT WANT TO RETRY!  . Adhesive [Tape]     This is tape as well as adhesive on bandaids.  . Darvon [Propoxyphene]   . Gabapentin     Unknown reaction  . Ivp Dye [Iodinated Diagnostic  Agents] Swelling  . Levaquin [Levofloxacin In D5w] Other (See Comments)    GI upset  . Levaquin [Levofloxacin] Nausea And Vomiting  . Oxycodone   . Oxycontin [Oxycodone Hcl]     Amnesia  . Propoxyphene Hcl Other (See Comments)    hallucination  . Seroquel [Quetiapine Fumerate]     Unknown reaction  . Penicillins Rash  . Sulfonamide Derivatives Rash        PHYSICAL EXAMINATION: Vital signs: BP 112/78   Pulse 72   Wt 198 lb (89.8 kg)   BMI 36.21 kg/m   Constitutional: generally well-appearing, no acute distress Psychiatric: alert and oriented x3, cooperative Eyes: extraocular movements intact, anicteric, conjunctiva pink Mouth: oral pharynx moist, no lesions Neck: supple no lymphadenopathy Cardiovascular: heart regular rate and rhythm, no murmur Lungs: clear to auscultation bilaterally Abdomen: soft, complaints of tenderness with mild palpation over the superficial epigastric area, nondistended, no obvious ascites, no peritoneal signs, normal bowel sounds, no organomegaly Rectal: Omitted Extremities: no clubbing, cyanosis, or lower extremity edema bilaterally Skin: no lesions on visible extremities Neuro: No focal deficits. Cranial nerves intact  ASSESSMENT:  #1. Epigastric discomfort with postprandial fullness. Suspect functional dyspepsia. Rule out gastroparesis #2. GERD. Reflux symptoms controlled on current regimen #3. Incidental esophageal ring #4. Previous and recent colonoscopies with diverticulosis and hemorrhoids   PLAN:  #1. Gastric emptying scan to be scheduled to rule out gastroparesis #2. Prescribed Bentyl 20 mg every 4-6 hours as needed for severe epigastric pain #3. Increase exercise #4. Decreased caloric intake #5. Office follow-up in 3 months. She is planning on a [place] vacation in mid May

## 2017-08-22 NOTE — Patient Instructions (Addendum)
We have sent the following medications to your pharmacy for you to pick up at your convenience:  Bentyl (dicyclomine)  You have been scheduled for a gastric emptying scan at [facility] on 09/05/2017 at 7:30am. Please arrive at least 15 minutes prior to your appointment for registration. Please make certain not to have anything to eat or drink after midnight the night before your test. Hold all stomach medications (ex: Zofran, phenergan, Reglan) 8 hours prior to your test. If you need to reschedule your appointment, please contact radiology scheduling at [phone]. _____________________________________________________________________ A gastric-emptying study measures how long it takes for food to move through your stomach. There are several ways to measure stomach emptying. In the most common test, you eat food that contains a small amount of radioactive material. A scanner that detects the movement of the radioactive material is placed over your abdomen to monitor the rate at which food leaves your stomach. This test normally takes about 4 hours to complete. _____________________________________________________________________  Please follow up on 11/04/2017 at 9:30am

## 2017-08-23 DIAGNOSIS — M545 Low back pain: Secondary | ICD-10-CM | POA: Diagnosis not present

## 2017-09-05 ENCOUNTER — Ambulatory Visit (HOSPITAL_COMMUNITY)
Admission: RE | Admit: 2017-09-05 | Discharge: 2017-09-05 | Disposition: A | Discharge status: Home / Self Care | Payer: Medicare Other | Source: Ambulatory Visit | Attending: Internal Medicine | Admitting: Internal Medicine

## 2017-09-05 DIAGNOSIS — R140 Abdominal distension (gaseous): Secondary | ICD-10-CM | POA: Diagnosis not present

## 2017-09-05 DIAGNOSIS — R1013 Epigastric pain: Secondary | ICD-10-CM | POA: Diagnosis not present

## 2017-09-05 MED ORDER — TECHNETIUM TC 99M SULFUR COLLOID
2.0000 | Freq: Once | INTRAVENOUS | Status: AC | PRN
  Administered 2017-09-05: 2 via INTRAVENOUS

## 2017-09-06 DIAGNOSIS — F4310 Post-traumatic stress disorder, unspecified: Secondary | ICD-10-CM | POA: Diagnosis not present

## 2017-09-09 DIAGNOSIS — M545 Low back pain: Secondary | ICD-10-CM | POA: Diagnosis not present

## 2017-09-10 DIAGNOSIS — F4310 Post-traumatic stress disorder, unspecified: Secondary | ICD-10-CM | POA: Diagnosis not present

## 2017-09-11 DIAGNOSIS — M545 Low back pain: Secondary | ICD-10-CM | POA: Diagnosis not present

## 2017-09-12 DIAGNOSIS — F329 Major depressive disorder, single episode, unspecified: Secondary | ICD-10-CM | POA: Diagnosis not present

## 2017-09-13 DIAGNOSIS — F329 Major depressive disorder, single episode, unspecified: Secondary | ICD-10-CM | POA: Diagnosis not present

## 2017-09-23 DIAGNOSIS — M545 Low back pain: Secondary | ICD-10-CM | POA: Diagnosis not present

## 2017-09-25 DIAGNOSIS — M545 Low back pain: Secondary | ICD-10-CM | POA: Diagnosis not present

## 2017-09-26 DIAGNOSIS — F4310 Post-traumatic stress disorder, unspecified: Secondary | ICD-10-CM | POA: Diagnosis not present

## 2017-09-27 DIAGNOSIS — F4310 Post-traumatic stress disorder, unspecified: Secondary | ICD-10-CM | POA: Diagnosis not present

## 2017-10-01 DIAGNOSIS — F4310 Post-traumatic stress disorder, unspecified: Secondary | ICD-10-CM | POA: Diagnosis not present

## 2017-10-02 DIAGNOSIS — M545 Low back pain: Secondary | ICD-10-CM | POA: Diagnosis not present

## 2017-10-03 DIAGNOSIS — F4310 Post-traumatic stress disorder, unspecified: Secondary | ICD-10-CM | POA: Diagnosis not present

## 2017-10-04 DIAGNOSIS — M9901 Segmental and somatic dysfunction of cervical region: Secondary | ICD-10-CM | POA: Diagnosis not present

## 2017-10-04 DIAGNOSIS — M9902 Segmental and somatic dysfunction of thoracic region: Secondary | ICD-10-CM | POA: Diagnosis not present

## 2017-10-04 DIAGNOSIS — M530 Cervicocranial syndrome: Secondary | ICD-10-CM | POA: Diagnosis not present

## 2017-10-04 DIAGNOSIS — M5032 Other cervical disc degeneration, mid-cervical region, unspecified level: Secondary | ICD-10-CM | POA: Diagnosis not present

## 2017-10-21 DIAGNOSIS — M545 Low back pain: Secondary | ICD-10-CM | POA: Diagnosis not present

## 2017-10-22 DIAGNOSIS — M9901 Segmental and somatic dysfunction of cervical region: Secondary | ICD-10-CM | POA: Diagnosis not present

## 2017-10-22 DIAGNOSIS — M9902 Segmental and somatic dysfunction of thoracic region: Secondary | ICD-10-CM | POA: Diagnosis not present

## 2017-10-22 DIAGNOSIS — M530 Cervicocranial syndrome: Secondary | ICD-10-CM | POA: Diagnosis not present

## 2017-10-22 DIAGNOSIS — F4310 Post-traumatic stress disorder, unspecified: Secondary | ICD-10-CM | POA: Diagnosis not present

## 2017-10-22 DIAGNOSIS — M5032 Other cervical disc degeneration, mid-cervical region, unspecified level: Secondary | ICD-10-CM | POA: Diagnosis not present

## 2017-10-23 DIAGNOSIS — M545 Low back pain: Secondary | ICD-10-CM | POA: Diagnosis not present

## 2017-10-24 DIAGNOSIS — F4310 Post-traumatic stress disorder, unspecified: Secondary | ICD-10-CM | POA: Diagnosis not present

## 2017-10-25 DIAGNOSIS — M530 Cervicocranial syndrome: Secondary | ICD-10-CM | POA: Diagnosis not present

## 2017-10-25 DIAGNOSIS — M9901 Segmental and somatic dysfunction of cervical region: Secondary | ICD-10-CM | POA: Diagnosis not present

## 2017-10-25 DIAGNOSIS — M9902 Segmental and somatic dysfunction of thoracic region: Secondary | ICD-10-CM | POA: Diagnosis not present

## 2017-10-25 DIAGNOSIS — M5032 Other cervical disc degeneration, mid-cervical region, unspecified level: Secondary | ICD-10-CM | POA: Diagnosis not present

## 2017-11-04 ENCOUNTER — Ambulatory Visit (INDEPENDENT_AMBULATORY_CARE_PROVIDER_SITE_OTHER): Payer: Medicare Other | Admitting: Internal Medicine

## 2017-11-04 ENCOUNTER — Encounter: Payer: Self-pay | Admitting: Internal Medicine

## 2017-11-04 VITALS — BP 120/80 | HR 88 | Ht 61.75 in | Wt 198.8 lb

## 2017-11-04 DIAGNOSIS — M9901 Segmental and somatic dysfunction of cervical region: Secondary | ICD-10-CM | POA: Diagnosis not present

## 2017-11-04 DIAGNOSIS — R140 Abdominal distension (gaseous): Secondary | ICD-10-CM

## 2017-11-04 DIAGNOSIS — M5032 Other cervical disc degeneration, mid-cervical region, unspecified level: Secondary | ICD-10-CM | POA: Diagnosis not present

## 2017-11-04 DIAGNOSIS — M9902 Segmental and somatic dysfunction of thoracic region: Secondary | ICD-10-CM | POA: Diagnosis not present

## 2017-11-04 DIAGNOSIS — R1013 Epigastric pain: Secondary | ICD-10-CM | POA: Diagnosis not present

## 2017-11-04 DIAGNOSIS — K219 Gastro-esophageal reflux disease without esophagitis: Secondary | ICD-10-CM | POA: Diagnosis not present

## 2017-11-04 DIAGNOSIS — M530 Cervicocranial syndrome: Secondary | ICD-10-CM | POA: Diagnosis not present

## 2017-11-04 NOTE — Patient Instructions (Signed)
Please follow up as needed 

## 2017-11-04 NOTE — Progress Notes (Signed)
HISTORY OF PRESENT ILLNESS:  Tamara [name] is a 57 y.o. female who presents today for follow-up regarding epigastric pain. Last evaluated 08/22/2017. See that dictation for details. She has undergone extensive negative workup. At the time of her last visit we arrange gastric emptying scan. This was normal. She was prescribed Bentyl. She tells me that she took 2 doses and that she had a side effect of visual impairment, thus no further dosing. No further visual problems. In terms of her abdomen, symptoms are about the same. No weight loss. Nothing is worse. She continues on her medications for reflux. She has been exercising as recommended. She is going on a [place] cruise in 2 weeks.  REVIEW OF SYSTEMS:  All non-GI ROS negative except for sinus and allergy, anxiety, back pain, visual change, depression, sleeping problems, ankle swelling, increased thirst  Past Medical History:  Diagnosis Date  . Allergy   . Anemia   . Anxiety   . Asthma   . Depression   . Diverticulitis   . GERD (gastroesophageal reflux disease)   . History of short term memory loss   . HTN (hypertension)   . Hyperlipemia   . Neuromuscular disorder ([place])   . PTSD (post-traumatic stress disorder)   . Shock therapy as cause of abnormal reaction of patient or of later complication without mention of misadventure at time of procedure     Past Surgical History:  Procedure Laterality Date  . ABDOMINAL HYSTERECTOMY     partial  . CHOLECYSTECTOMY    . COLONOSCOPY    . OTHER SURGICAL HISTORY     electric shock therapy  . TUBAL LIGATION    . UPPER GASTROINTESTINAL ENDOSCOPY      Social History Tamara [name]  reports that she has never smoked. She has never used smokeless tobacco. She reports that she does not drink alcohol or use drugs.  family history includes Arrhythmia in her father; COPD in her father; Cancer in her mother; Colon cancer in her paternal uncle; Coronary artery disease (age of onset: 53) in  her maternal grandfather; Diabetes in her daughter and paternal grandfather; Hearing loss in her paternal grandfather and paternal grandmother; Heart disease in her father and maternal grandfather; Hyperlipidemia in her brother, father, maternal grandmother, and mother; Hypertension in her father, maternal grandfather, and mother; Stroke in her father.  Allergies  Allergen Reactions  . Statins Other (See Comments)    Severe mm pain (esp. Arms) >> DOES NOT WANT TO RETRY!  . Adhesive [Tape]     This is tape as well as adhesive on bandaids.  . Darvon [Propoxyphene]   . Dicyclomine     Vision loss in one eye  . Gabapentin     Unknown reaction  . Ivp Dye [Iodinated Diagnostic Agents] Swelling  . Levaquin [Levofloxacin In D5w] Other (See Comments)    GI upset  . Levaquin [Levofloxacin] Nausea And Vomiting  . Oxycodone   . Oxycontin [Oxycodone Hcl]     Amnesia  . Propoxyphene Hcl Other (See Comments)    hallucination  . Seroquel [Quetiapine Fumerate]     Unknown reaction  . Penicillins Rash  . Sulfonamide Derivatives Rash       PHYSICAL EXAMINATION: Vital signs: BP 120/80   Pulse 88   Ht 5' 1.75" (1.568 m) Comment: without shoes  Wt 198 lb 12.8 oz (90.2 kg)   BMI 36.66 kg/m   Constitutional: generally well-appearing, no acute distress Psychiatric: alert and oriented x3, cooperative Eyes: extraocular  movements intact, anicteric, conjunctiva pink Mouth: oral pharynx moist, no lesions Neck: supple no lymphadenopathy Cardiovascular: heart regular rate and rhythm, no murmur Lungs: clear to auscultation bilaterally Abdomen: soft, nontender, nondistended, no obvious ascites, no peritoneal signs, normal bowel sounds, no organomegaly Rectal:omitted Extremities: no clubbing, cyanosis, or lower extremity edema bilaterally Skin: no lesions on visible extremities Neuro: No focal deficits.   ASSESSMENT:  #1. Functional dyspepsia. Ongoing #2. GERD. #3. Obesity   PLAN:  #1.  Reassurance #2. Reflux precautions #3. Exercise and weight loss #4. GI follow-up as needed

## 2017-11-12 DIAGNOSIS — F4310 Post-traumatic stress disorder, unspecified: Secondary | ICD-10-CM | POA: Diagnosis not present

## 2017-11-13 DIAGNOSIS — F329 Major depressive disorder, single episode, unspecified: Secondary | ICD-10-CM | POA: Diagnosis not present

## 2017-11-14 DIAGNOSIS — F4310 Post-traumatic stress disorder, unspecified: Secondary | ICD-10-CM | POA: Diagnosis not present

## 2017-11-26 DIAGNOSIS — F4310 Post-traumatic stress disorder, unspecified: Secondary | ICD-10-CM | POA: Diagnosis not present

## 2017-11-28 DIAGNOSIS — F4310 Post-traumatic stress disorder, unspecified: Secondary | ICD-10-CM | POA: Diagnosis not present

## 2017-12-03 DIAGNOSIS — F4310 Post-traumatic stress disorder, unspecified: Secondary | ICD-10-CM | POA: Diagnosis not present

## 2017-12-04 DIAGNOSIS — R5383 Other fatigue: Secondary | ICD-10-CM | POA: Diagnosis not present

## 2017-12-05 DIAGNOSIS — E782 Mixed hyperlipidemia: Secondary | ICD-10-CM | POA: Diagnosis not present

## 2017-12-05 DIAGNOSIS — R7989 Other specified abnormal findings of blood chemistry: Secondary | ICD-10-CM | POA: Diagnosis not present

## 2017-12-05 DIAGNOSIS — R5383 Other fatigue: Secondary | ICD-10-CM | POA: Diagnosis not present

## 2017-12-05 DIAGNOSIS — E559 Vitamin D deficiency, unspecified: Secondary | ICD-10-CM | POA: Diagnosis not present

## 2017-12-10 DIAGNOSIS — R5383 Other fatigue: Secondary | ICD-10-CM | POA: Diagnosis not present

## 2017-12-13 DIAGNOSIS — F4310 Post-traumatic stress disorder, unspecified: Secondary | ICD-10-CM | POA: Diagnosis not present

## 2017-12-16 DIAGNOSIS — M9901 Segmental and somatic dysfunction of cervical region: Secondary | ICD-10-CM | POA: Diagnosis not present

## 2017-12-16 DIAGNOSIS — M9902 Segmental and somatic dysfunction of thoracic region: Secondary | ICD-10-CM | POA: Diagnosis not present

## 2017-12-16 DIAGNOSIS — M530 Cervicocranial syndrome: Secondary | ICD-10-CM | POA: Diagnosis not present

## 2017-12-16 DIAGNOSIS — M5032 Other cervical disc degeneration, mid-cervical region, unspecified level: Secondary | ICD-10-CM | POA: Diagnosis not present

## 2017-12-17 DIAGNOSIS — F4310 Post-traumatic stress disorder, unspecified: Secondary | ICD-10-CM | POA: Diagnosis not present

## 2017-12-19 DIAGNOSIS — M9903 Segmental and somatic dysfunction of lumbar region: Secondary | ICD-10-CM | POA: Diagnosis not present

## 2017-12-19 DIAGNOSIS — M9901 Segmental and somatic dysfunction of cervical region: Secondary | ICD-10-CM | POA: Diagnosis not present

## 2017-12-19 DIAGNOSIS — M5386 Other specified dorsopathies, lumbar region: Secondary | ICD-10-CM | POA: Diagnosis not present

## 2017-12-19 DIAGNOSIS — M5414 Radiculopathy, thoracic region: Secondary | ICD-10-CM | POA: Diagnosis not present

## 2017-12-19 DIAGNOSIS — M5032 Other cervical disc degeneration, mid-cervical region, unspecified level: Secondary | ICD-10-CM | POA: Diagnosis not present

## 2017-12-19 DIAGNOSIS — M9902 Segmental and somatic dysfunction of thoracic region: Secondary | ICD-10-CM | POA: Diagnosis not present

## 2017-12-23 DIAGNOSIS — M5386 Other specified dorsopathies, lumbar region: Secondary | ICD-10-CM | POA: Diagnosis not present

## 2017-12-23 DIAGNOSIS — M5414 Radiculopathy, thoracic region: Secondary | ICD-10-CM | POA: Diagnosis not present

## 2017-12-23 DIAGNOSIS — M9902 Segmental and somatic dysfunction of thoracic region: Secondary | ICD-10-CM | POA: Diagnosis not present

## 2017-12-23 DIAGNOSIS — M9903 Segmental and somatic dysfunction of lumbar region: Secondary | ICD-10-CM | POA: Diagnosis not present

## 2017-12-23 DIAGNOSIS — M5032 Other cervical disc degeneration, mid-cervical region, unspecified level: Secondary | ICD-10-CM | POA: Diagnosis not present

## 2017-12-23 DIAGNOSIS — M9901 Segmental and somatic dysfunction of cervical region: Secondary | ICD-10-CM | POA: Diagnosis not present

## 2017-12-24 DIAGNOSIS — F4310 Post-traumatic stress disorder, unspecified: Secondary | ICD-10-CM | POA: Diagnosis not present

## 2017-12-26 DIAGNOSIS — M5414 Radiculopathy, thoracic region: Secondary | ICD-10-CM | POA: Diagnosis not present

## 2017-12-26 DIAGNOSIS — M9903 Segmental and somatic dysfunction of lumbar region: Secondary | ICD-10-CM | POA: Diagnosis not present

## 2017-12-26 DIAGNOSIS — M5386 Other specified dorsopathies, lumbar region: Secondary | ICD-10-CM | POA: Diagnosis not present

## 2017-12-26 DIAGNOSIS — M5032 Other cervical disc degeneration, mid-cervical region, unspecified level: Secondary | ICD-10-CM | POA: Diagnosis not present

## 2017-12-26 DIAGNOSIS — M9901 Segmental and somatic dysfunction of cervical region: Secondary | ICD-10-CM | POA: Diagnosis not present

## 2017-12-26 DIAGNOSIS — F4310 Post-traumatic stress disorder, unspecified: Secondary | ICD-10-CM | POA: Diagnosis not present

## 2017-12-26 DIAGNOSIS — M9902 Segmental and somatic dysfunction of thoracic region: Secondary | ICD-10-CM | POA: Diagnosis not present

## 2017-12-27 DIAGNOSIS — N183 Chronic kidney disease, stage 3 (moderate): Secondary | ICD-10-CM | POA: Diagnosis not present

## 2017-12-27 DIAGNOSIS — I119 Hypertensive heart disease without heart failure: Secondary | ICD-10-CM | POA: Diagnosis not present

## 2017-12-27 DIAGNOSIS — E782 Mixed hyperlipidemia: Secondary | ICD-10-CM | POA: Diagnosis not present

## 2017-12-30 DIAGNOSIS — M5386 Other specified dorsopathies, lumbar region: Secondary | ICD-10-CM | POA: Diagnosis not present

## 2017-12-30 DIAGNOSIS — M9902 Segmental and somatic dysfunction of thoracic region: Secondary | ICD-10-CM | POA: Diagnosis not present

## 2017-12-30 DIAGNOSIS — M5032 Other cervical disc degeneration, mid-cervical region, unspecified level: Secondary | ICD-10-CM | POA: Diagnosis not present

## 2017-12-30 DIAGNOSIS — M9901 Segmental and somatic dysfunction of cervical region: Secondary | ICD-10-CM | POA: Diagnosis not present

## 2017-12-30 DIAGNOSIS — M5414 Radiculopathy, thoracic region: Secondary | ICD-10-CM | POA: Diagnosis not present

## 2017-12-30 DIAGNOSIS — M9903 Segmental and somatic dysfunction of lumbar region: Secondary | ICD-10-CM | POA: Diagnosis not present

## 2017-12-31 DIAGNOSIS — F4310 Post-traumatic stress disorder, unspecified: Secondary | ICD-10-CM | POA: Diagnosis not present

## 2018-01-01 DIAGNOSIS — M5386 Other specified dorsopathies, lumbar region: Secondary | ICD-10-CM | POA: Diagnosis not present

## 2018-01-01 DIAGNOSIS — M9901 Segmental and somatic dysfunction of cervical region: Secondary | ICD-10-CM | POA: Diagnosis not present

## 2018-01-01 DIAGNOSIS — M9903 Segmental and somatic dysfunction of lumbar region: Secondary | ICD-10-CM | POA: Diagnosis not present

## 2018-01-01 DIAGNOSIS — M5414 Radiculopathy, thoracic region: Secondary | ICD-10-CM | POA: Diagnosis not present

## 2018-01-01 DIAGNOSIS — M9902 Segmental and somatic dysfunction of thoracic region: Secondary | ICD-10-CM | POA: Diagnosis not present

## 2018-01-01 DIAGNOSIS — M5032 Other cervical disc degeneration, mid-cervical region, unspecified level: Secondary | ICD-10-CM | POA: Diagnosis not present

## 2018-01-06 DIAGNOSIS — M5386 Other specified dorsopathies, lumbar region: Secondary | ICD-10-CM | POA: Diagnosis not present

## 2018-01-06 DIAGNOSIS — M9903 Segmental and somatic dysfunction of lumbar region: Secondary | ICD-10-CM | POA: Diagnosis not present

## 2018-01-06 DIAGNOSIS — M5032 Other cervical disc degeneration, mid-cervical region, unspecified level: Secondary | ICD-10-CM | POA: Diagnosis not present

## 2018-01-06 DIAGNOSIS — M5414 Radiculopathy, thoracic region: Secondary | ICD-10-CM | POA: Diagnosis not present

## 2018-01-06 DIAGNOSIS — M9901 Segmental and somatic dysfunction of cervical region: Secondary | ICD-10-CM | POA: Diagnosis not present

## 2018-01-06 DIAGNOSIS — M9902 Segmental and somatic dysfunction of thoracic region: Secondary | ICD-10-CM | POA: Diagnosis not present

## 2018-01-06 DIAGNOSIS — R5383 Other fatigue: Secondary | ICD-10-CM | POA: Diagnosis not present

## 2018-01-08 DIAGNOSIS — M9903 Segmental and somatic dysfunction of lumbar region: Secondary | ICD-10-CM | POA: Diagnosis not present

## 2018-01-08 DIAGNOSIS — M9901 Segmental and somatic dysfunction of cervical region: Secondary | ICD-10-CM | POA: Diagnosis not present

## 2018-01-08 DIAGNOSIS — M5386 Other specified dorsopathies, lumbar region: Secondary | ICD-10-CM | POA: Diagnosis not present

## 2018-01-08 DIAGNOSIS — M5032 Other cervical disc degeneration, mid-cervical region, unspecified level: Secondary | ICD-10-CM | POA: Diagnosis not present

## 2018-01-08 DIAGNOSIS — M5414 Radiculopathy, thoracic region: Secondary | ICD-10-CM | POA: Diagnosis not present

## 2018-01-08 DIAGNOSIS — M9902 Segmental and somatic dysfunction of thoracic region: Secondary | ICD-10-CM | POA: Diagnosis not present

## 2018-01-13 DIAGNOSIS — M5414 Radiculopathy, thoracic region: Secondary | ICD-10-CM | POA: Diagnosis not present

## 2018-01-13 DIAGNOSIS — M9902 Segmental and somatic dysfunction of thoracic region: Secondary | ICD-10-CM | POA: Diagnosis not present

## 2018-01-13 DIAGNOSIS — M5032 Other cervical disc degeneration, mid-cervical region, unspecified level: Secondary | ICD-10-CM | POA: Diagnosis not present

## 2018-01-13 DIAGNOSIS — M5386 Other specified dorsopathies, lumbar region: Secondary | ICD-10-CM | POA: Diagnosis not present

## 2018-01-13 DIAGNOSIS — M9901 Segmental and somatic dysfunction of cervical region: Secondary | ICD-10-CM | POA: Diagnosis not present

## 2018-01-13 DIAGNOSIS — M9903 Segmental and somatic dysfunction of lumbar region: Secondary | ICD-10-CM | POA: Diagnosis not present

## 2018-01-14 ENCOUNTER — Other Ambulatory Visit: Payer: Self-pay | Admitting: Internal Medicine

## 2018-01-14 ENCOUNTER — Ambulatory Visit
Admission: RE | Admit: 2018-01-14 | Discharge: 2018-01-14 | Disposition: A | Discharge status: Home / Self Care | Payer: Medicare Other | Source: Ambulatory Visit | Attending: Internal Medicine | Admitting: Internal Medicine

## 2018-01-14 ENCOUNTER — Ambulatory Visit (HOSPITAL_COMMUNITY)
Admission: EM | Admit: 2018-01-14 | Discharge: 2018-01-14 | Discharge status: Absent without leave | Payer: Medicare Other

## 2018-01-14 DIAGNOSIS — R52 Pain, unspecified: Secondary | ICD-10-CM

## 2018-01-14 DIAGNOSIS — F4310 Post-traumatic stress disorder, unspecified: Secondary | ICD-10-CM | POA: Diagnosis not present

## 2018-01-14 DIAGNOSIS — R079 Chest pain, unspecified: Secondary | ICD-10-CM | POA: Diagnosis not present

## 2018-01-14 DIAGNOSIS — I119 Hypertensive heart disease without heart failure: Secondary | ICD-10-CM | POA: Diagnosis not present

## 2018-01-14 DIAGNOSIS — R0789 Other chest pain: Secondary | ICD-10-CM | POA: Diagnosis not present

## 2018-01-16 DIAGNOSIS — M5032 Other cervical disc degeneration, mid-cervical region, unspecified level: Secondary | ICD-10-CM | POA: Diagnosis not present

## 2018-01-16 DIAGNOSIS — M9902 Segmental and somatic dysfunction of thoracic region: Secondary | ICD-10-CM | POA: Diagnosis not present

## 2018-01-16 DIAGNOSIS — M5414 Radiculopathy, thoracic region: Secondary | ICD-10-CM | POA: Diagnosis not present

## 2018-01-16 DIAGNOSIS — F4310 Post-traumatic stress disorder, unspecified: Secondary | ICD-10-CM | POA: Diagnosis not present

## 2018-01-16 DIAGNOSIS — M9901 Segmental and somatic dysfunction of cervical region: Secondary | ICD-10-CM | POA: Diagnosis not present

## 2018-01-16 DIAGNOSIS — M5386 Other specified dorsopathies, lumbar region: Secondary | ICD-10-CM | POA: Diagnosis not present

## 2018-01-16 DIAGNOSIS — M9903 Segmental and somatic dysfunction of lumbar region: Secondary | ICD-10-CM | POA: Diagnosis not present

## 2018-01-21 DIAGNOSIS — F4310 Post-traumatic stress disorder, unspecified: Secondary | ICD-10-CM | POA: Diagnosis not present

## 2018-01-23 DIAGNOSIS — M5386 Other specified dorsopathies, lumbar region: Secondary | ICD-10-CM | POA: Diagnosis not present

## 2018-01-23 DIAGNOSIS — M9901 Segmental and somatic dysfunction of cervical region: Secondary | ICD-10-CM | POA: Diagnosis not present

## 2018-01-23 DIAGNOSIS — M5414 Radiculopathy, thoracic region: Secondary | ICD-10-CM | POA: Diagnosis not present

## 2018-01-23 DIAGNOSIS — M5032 Other cervical disc degeneration, mid-cervical region, unspecified level: Secondary | ICD-10-CM | POA: Diagnosis not present

## 2018-01-23 DIAGNOSIS — M9902 Segmental and somatic dysfunction of thoracic region: Secondary | ICD-10-CM | POA: Diagnosis not present

## 2018-01-23 DIAGNOSIS — M9903 Segmental and somatic dysfunction of lumbar region: Secondary | ICD-10-CM | POA: Diagnosis not present

## 2018-01-23 DIAGNOSIS — F4310 Post-traumatic stress disorder, unspecified: Secondary | ICD-10-CM | POA: Diagnosis not present

## 2018-01-28 DIAGNOSIS — M9902 Segmental and somatic dysfunction of thoracic region: Secondary | ICD-10-CM | POA: Diagnosis not present

## 2018-01-28 DIAGNOSIS — M9901 Segmental and somatic dysfunction of cervical region: Secondary | ICD-10-CM | POA: Diagnosis not present

## 2018-01-28 DIAGNOSIS — F4310 Post-traumatic stress disorder, unspecified: Secondary | ICD-10-CM | POA: Diagnosis not present

## 2018-01-28 DIAGNOSIS — M5386 Other specified dorsopathies, lumbar region: Secondary | ICD-10-CM | POA: Diagnosis not present

## 2018-01-28 DIAGNOSIS — M9903 Segmental and somatic dysfunction of lumbar region: Secondary | ICD-10-CM | POA: Diagnosis not present

## 2018-01-28 DIAGNOSIS — M5032 Other cervical disc degeneration, mid-cervical region, unspecified level: Secondary | ICD-10-CM | POA: Diagnosis not present

## 2018-01-28 DIAGNOSIS — M5414 Radiculopathy, thoracic region: Secondary | ICD-10-CM | POA: Diagnosis not present

## 2018-01-30 DIAGNOSIS — M5386 Other specified dorsopathies, lumbar region: Secondary | ICD-10-CM | POA: Diagnosis not present

## 2018-01-30 DIAGNOSIS — M9901 Segmental and somatic dysfunction of cervical region: Secondary | ICD-10-CM | POA: Diagnosis not present

## 2018-01-30 DIAGNOSIS — M9903 Segmental and somatic dysfunction of lumbar region: Secondary | ICD-10-CM | POA: Diagnosis not present

## 2018-01-30 DIAGNOSIS — M9902 Segmental and somatic dysfunction of thoracic region: Secondary | ICD-10-CM | POA: Diagnosis not present

## 2018-01-30 DIAGNOSIS — M5032 Other cervical disc degeneration, mid-cervical region, unspecified level: Secondary | ICD-10-CM | POA: Diagnosis not present

## 2018-01-30 DIAGNOSIS — M5414 Radiculopathy, thoracic region: Secondary | ICD-10-CM | POA: Diagnosis not present

## 2018-01-30 DIAGNOSIS — F4310 Post-traumatic stress disorder, unspecified: Secondary | ICD-10-CM | POA: Diagnosis not present

## 2018-02-04 DIAGNOSIS — M9903 Segmental and somatic dysfunction of lumbar region: Secondary | ICD-10-CM | POA: Diagnosis not present

## 2018-02-04 DIAGNOSIS — M9902 Segmental and somatic dysfunction of thoracic region: Secondary | ICD-10-CM | POA: Diagnosis not present

## 2018-02-04 DIAGNOSIS — F4310 Post-traumatic stress disorder, unspecified: Secondary | ICD-10-CM | POA: Diagnosis not present

## 2018-02-04 DIAGNOSIS — M5414 Radiculopathy, thoracic region: Secondary | ICD-10-CM | POA: Diagnosis not present

## 2018-02-04 DIAGNOSIS — M9901 Segmental and somatic dysfunction of cervical region: Secondary | ICD-10-CM | POA: Diagnosis not present

## 2018-02-04 DIAGNOSIS — M5386 Other specified dorsopathies, lumbar region: Secondary | ICD-10-CM | POA: Diagnosis not present

## 2018-02-04 DIAGNOSIS — M5032 Other cervical disc degeneration, mid-cervical region, unspecified level: Secondary | ICD-10-CM | POA: Diagnosis not present

## 2018-02-06 DIAGNOSIS — F4310 Post-traumatic stress disorder, unspecified: Secondary | ICD-10-CM | POA: Diagnosis not present

## 2018-02-06 DIAGNOSIS — M9901 Segmental and somatic dysfunction of cervical region: Secondary | ICD-10-CM | POA: Diagnosis not present

## 2018-02-06 DIAGNOSIS — M5386 Other specified dorsopathies, lumbar region: Secondary | ICD-10-CM | POA: Diagnosis not present

## 2018-02-06 DIAGNOSIS — M9903 Segmental and somatic dysfunction of lumbar region: Secondary | ICD-10-CM | POA: Diagnosis not present

## 2018-02-06 DIAGNOSIS — M5032 Other cervical disc degeneration, mid-cervical region, unspecified level: Secondary | ICD-10-CM | POA: Diagnosis not present

## 2018-02-06 DIAGNOSIS — M9902 Segmental and somatic dysfunction of thoracic region: Secondary | ICD-10-CM | POA: Diagnosis not present

## 2018-02-06 DIAGNOSIS — M5414 Radiculopathy, thoracic region: Secondary | ICD-10-CM | POA: Diagnosis not present

## 2018-02-10 DIAGNOSIS — F329 Major depressive disorder, single episode, unspecified: Secondary | ICD-10-CM | POA: Diagnosis not present

## 2018-02-11 DIAGNOSIS — F4310 Post-traumatic stress disorder, unspecified: Secondary | ICD-10-CM | POA: Diagnosis not present

## 2018-02-12 DIAGNOSIS — M9901 Segmental and somatic dysfunction of cervical region: Secondary | ICD-10-CM | POA: Diagnosis not present

## 2018-02-12 DIAGNOSIS — M5414 Radiculopathy, thoracic region: Secondary | ICD-10-CM | POA: Diagnosis not present

## 2018-02-12 DIAGNOSIS — M5386 Other specified dorsopathies, lumbar region: Secondary | ICD-10-CM | POA: Diagnosis not present

## 2018-02-12 DIAGNOSIS — M9903 Segmental and somatic dysfunction of lumbar region: Secondary | ICD-10-CM | POA: Diagnosis not present

## 2018-02-12 DIAGNOSIS — M5032 Other cervical disc degeneration, mid-cervical region, unspecified level: Secondary | ICD-10-CM | POA: Diagnosis not present

## 2018-02-12 DIAGNOSIS — M9902 Segmental and somatic dysfunction of thoracic region: Secondary | ICD-10-CM | POA: Diagnosis not present

## 2018-02-14 DIAGNOSIS — L814 Other melanin hyperpigmentation: Secondary | ICD-10-CM | POA: Diagnosis not present

## 2018-02-14 DIAGNOSIS — L819 Disorder of pigmentation, unspecified: Secondary | ICD-10-CM | POA: Diagnosis not present

## 2018-02-14 DIAGNOSIS — D485 Neoplasm of uncertain behavior of skin: Secondary | ICD-10-CM | POA: Diagnosis not present

## 2018-02-14 DIAGNOSIS — L821 Other seborrheic keratosis: Secondary | ICD-10-CM | POA: Diagnosis not present

## 2018-02-14 DIAGNOSIS — D229 Melanocytic nevi, unspecified: Secondary | ICD-10-CM | POA: Diagnosis not present

## 2018-02-14 DIAGNOSIS — D1801 Hemangioma of skin and subcutaneous tissue: Secondary | ICD-10-CM | POA: Diagnosis not present

## 2018-02-14 DIAGNOSIS — L570 Actinic keratosis: Secondary | ICD-10-CM | POA: Diagnosis not present

## 2018-02-18 DIAGNOSIS — F4310 Post-traumatic stress disorder, unspecified: Secondary | ICD-10-CM | POA: Diagnosis not present

## 2018-02-20 DIAGNOSIS — F4310 Post-traumatic stress disorder, unspecified: Secondary | ICD-10-CM | POA: Diagnosis not present

## 2018-03-04 DIAGNOSIS — F4310 Post-traumatic stress disorder, unspecified: Secondary | ICD-10-CM | POA: Diagnosis not present

## 2018-03-06 DIAGNOSIS — F4310 Post-traumatic stress disorder, unspecified: Secondary | ICD-10-CM | POA: Diagnosis not present

## 2018-03-11 DIAGNOSIS — F4310 Post-traumatic stress disorder, unspecified: Secondary | ICD-10-CM | POA: Diagnosis not present

## 2018-03-14 ENCOUNTER — Inpatient Hospital Stay (HOSPITAL_COMMUNITY)
Admission: EM | Admit: 2018-03-14 | Discharge: 2018-03-21 | DRG: 872 | Disposition: A | Discharge status: Home / Self Care | Payer: Medicare Other | Attending: Family Medicine | Admitting: Family Medicine

## 2018-03-14 ENCOUNTER — Emergency Department (HOSPITAL_COMMUNITY): Payer: Medicare Other

## 2018-03-14 DIAGNOSIS — Z79899 Other long term (current) drug therapy: Secondary | ICD-10-CM | POA: Diagnosis not present

## 2018-03-14 DIAGNOSIS — K760 Fatty (change of) liver, not elsewhere classified: Secondary | ICD-10-CM | POA: Diagnosis present

## 2018-03-14 DIAGNOSIS — F4310 Post-traumatic stress disorder, unspecified: Secondary | ICD-10-CM | POA: Diagnosis present

## 2018-03-14 DIAGNOSIS — R1084 Generalized abdominal pain: Secondary | ICD-10-CM | POA: Diagnosis not present

## 2018-03-14 DIAGNOSIS — Z7982 Long term (current) use of aspirin: Secondary | ICD-10-CM

## 2018-03-14 DIAGNOSIS — Z9049 Acquired absence of other specified parts of digestive tract: Secondary | ICD-10-CM

## 2018-03-14 DIAGNOSIS — A419 Sepsis, unspecified organism: Secondary | ICD-10-CM | POA: Diagnosis not present

## 2018-03-14 DIAGNOSIS — R21 Rash and other nonspecific skin eruption: Secondary | ICD-10-CM | POA: Diagnosis not present

## 2018-03-14 DIAGNOSIS — Z91041 Radiographic dye allergy status: Secondary | ICD-10-CM | POA: Diagnosis not present

## 2018-03-14 DIAGNOSIS — Z8249 Family history of ischemic heart disease and other diseases of the circulatory system: Secondary | ICD-10-CM | POA: Diagnosis not present

## 2018-03-14 DIAGNOSIS — Z6835 Body mass index (BMI) 35.0-35.9, adult: Secondary | ICD-10-CM

## 2018-03-14 DIAGNOSIS — Z882 Allergy status to sulfonamides status: Secondary | ICD-10-CM

## 2018-03-14 DIAGNOSIS — I129 Hypertensive chronic kidney disease with stage 1 through stage 4 chronic kidney disease, or unspecified chronic kidney disease: Secondary | ICD-10-CM | POA: Diagnosis present

## 2018-03-14 DIAGNOSIS — Z825 Family history of asthma and other chronic lower respiratory diseases: Secondary | ICD-10-CM | POA: Diagnosis not present

## 2018-03-14 DIAGNOSIS — E785 Hyperlipidemia, unspecified: Secondary | ICD-10-CM | POA: Diagnosis present

## 2018-03-14 DIAGNOSIS — F419 Anxiety disorder, unspecified: Secondary | ICD-10-CM | POA: Diagnosis not present

## 2018-03-14 DIAGNOSIS — K219 Gastro-esophageal reflux disease without esophagitis: Secondary | ICD-10-CM | POA: Diagnosis present

## 2018-03-14 DIAGNOSIS — Z90710 Acquired absence of both cervix and uterus: Secondary | ICD-10-CM

## 2018-03-14 DIAGNOSIS — Z8349 Family history of other endocrine, nutritional and metabolic diseases: Secondary | ICD-10-CM

## 2018-03-14 DIAGNOSIS — N183 Chronic kidney disease, stage 3 unspecified: Secondary | ICD-10-CM | POA: Diagnosis present

## 2018-03-14 DIAGNOSIS — Z833 Family history of diabetes mellitus: Secondary | ICD-10-CM

## 2018-03-14 DIAGNOSIS — R197 Diarrhea, unspecified: Secondary | ICD-10-CM | POA: Diagnosis not present

## 2018-03-14 DIAGNOSIS — E669 Obesity, unspecified: Secondary | ICD-10-CM | POA: Diagnosis present

## 2018-03-14 DIAGNOSIS — T361X5A Adverse effect of cephalosporins and other beta-lactam antibiotics, initial encounter: Secondary | ICD-10-CM | POA: Diagnosis not present

## 2018-03-14 DIAGNOSIS — Z885 Allergy status to narcotic agent status: Secondary | ICD-10-CM

## 2018-03-14 DIAGNOSIS — I10 Essential (primary) hypertension: Secondary | ICD-10-CM | POA: Diagnosis not present

## 2018-03-14 DIAGNOSIS — R0689 Other abnormalities of breathing: Secondary | ICD-10-CM | POA: Diagnosis not present

## 2018-03-14 DIAGNOSIS — F329 Major depressive disorder, single episode, unspecified: Secondary | ICD-10-CM | POA: Diagnosis present

## 2018-03-14 DIAGNOSIS — K5720 Diverticulitis of large intestine with perforation and abscess without bleeding: Secondary | ICD-10-CM | POA: Diagnosis present

## 2018-03-14 DIAGNOSIS — Z881 Allergy status to other antibiotic agents status: Secondary | ICD-10-CM | POA: Diagnosis not present

## 2018-03-14 DIAGNOSIS — R52 Pain, unspecified: Secondary | ICD-10-CM | POA: Diagnosis not present

## 2018-03-14 DIAGNOSIS — Z888 Allergy status to other drugs, medicaments and biological substances status: Secondary | ICD-10-CM

## 2018-03-14 DIAGNOSIS — Z823 Family history of stroke: Secondary | ICD-10-CM

## 2018-03-14 DIAGNOSIS — Z880 Allergy status to penicillin: Secondary | ICD-10-CM

## 2018-03-14 DIAGNOSIS — E876 Hypokalemia: Secondary | ICD-10-CM | POA: Diagnosis present

## 2018-03-14 DIAGNOSIS — K5732 Diverticulitis of large intestine without perforation or abscess without bleeding: Secondary | ICD-10-CM | POA: Diagnosis present

## 2018-03-14 DIAGNOSIS — K449 Diaphragmatic hernia without obstruction or gangrene: Secondary | ICD-10-CM | POA: Diagnosis present

## 2018-03-14 DIAGNOSIS — Z91048 Other nonmedicinal substance allergy status: Secondary | ICD-10-CM

## 2018-03-14 LAB — COMPREHENSIVE METABOLIC PANEL
ALBUMIN: 4.2 g/dL (ref 3.5–5.0)
ALT: 22 U/L (ref 0–44)
AST: 28 U/L (ref 15–41)
Alkaline Phosphatase: 78 U/L (ref 38–126)
Anion gap: 13 (ref 5–15)
BILIRUBIN TOTAL: 1.1 mg/dL (ref 0.3–1.2)
BUN: 23 mg/dL — AB (ref 6–20)
CO2: 22 mmol/L (ref 22–32)
Calcium: 9.8 mg/dL (ref 8.9–10.3)
Chloride: 103 mmol/L (ref 98–111)
Creatinine, Ser: 1.21 mg/dL — ABNORMAL HIGH (ref 0.44–1.00)
GFR calc Af Amer: 57 mL/min — ABNORMAL LOW (ref >60)
GFR calc non Af Amer: 49 mL/min — ABNORMAL LOW (ref >60)
GLUCOSE: 126 mg/dL — AB (ref 70–99)
POTASSIUM: 3.5 mmol/L (ref 3.5–5.1)
Sodium: 138 mmol/L (ref 135–145)
Total Protein: 7.5 g/dL (ref 6.5–8.1)

## 2018-03-14 LAB — URINALYSIS, ROUTINE W REFLEX MICROSCOPIC
Bilirubin Urine: NEGATIVE
Glucose, UA: NEGATIVE mg/dL
Hgb urine dipstick: NEGATIVE
Ketones, ur: NEGATIVE mg/dL
Nitrite: NEGATIVE
Protein, ur: NEGATIVE mg/dL
SPECIFIC GRAVITY, URINE: 1.012 (ref 1.005–1.030)
pH: 8 (ref 5.0–8.0)

## 2018-03-14 LAB — LIPASE, BLOOD: LIPASE: 55 U/L — AB (ref 11–51)

## 2018-03-14 LAB — CBC
HEMATOCRIT: 38.9 % (ref 36.0–46.0)
Hemoglobin: 13.2 g/dL (ref 12.0–15.0)
MCH: 29.3 pg (ref 26.0–34.0)
MCHC: 33.9 g/dL (ref 30.0–36.0)
MCV: 86.3 fL (ref 78.0–100.0)
Platelets: 252 10*3/uL (ref 150–400)
RBC: 4.51 MIL/uL (ref 3.87–5.11)
RDW: 13.2 % (ref 11.5–15.5)
WBC: 11.7 10*3/uL — ABNORMAL HIGH (ref 4.0–10.5)

## 2018-03-14 MED ORDER — METRONIDAZOLE IN NACL 5-0.79 MG/ML-% IV SOLN
500.0000 mg | Freq: Once | INTRAVENOUS | Status: AC
  Administered 2018-03-14: 500 mg via INTRAVENOUS
  Filled 2018-03-14: qty 100

## 2018-03-14 MED ORDER — SODIUM CHLORIDE 0.9 % IV BOLUS (SEPSIS)
2000.0000 mL | Freq: Once | INTRAVENOUS | Status: AC
  Administered 2018-03-15: 2000 mL via INTRAVENOUS

## 2018-03-14 MED ORDER — ALBUTEROL SULFATE (2.5 MG/3ML) 0.083% IN NEBU: 2.5000 mg | INHALATION_SOLUTION | Freq: Four times a day (QID) | RESPIRATORY_TRACT | Status: DC | PRN

## 2018-03-14 MED ORDER — ONDANSETRON HCL 4 MG/2ML IJ SOLN
4.0000 mg | Freq: Once | INTRAMUSCULAR | Status: DC
  Filled 2018-03-14: qty 2

## 2018-03-14 MED ORDER — ONDANSETRON HCL 4 MG/2ML IJ SOLN: 4.0000 mg | Freq: Four times a day (QID) | INTRAMUSCULAR | Status: DC | PRN

## 2018-03-14 MED ORDER — CIPROFLOXACIN IN D5W 400 MG/200ML IV SOLN
400.0000 mg | Freq: Two times a day (BID) | INTRAVENOUS | Status: DC
  Administered 2018-03-15: 400 mg via INTRAVENOUS
  Filled 2018-03-14: qty 200

## 2018-03-14 MED ORDER — SODIUM CHLORIDE 0.9 % IV SOLN
2.0000 g | Freq: Once | INTRAVENOUS | Status: AC
  Administered 2018-03-14: 2 g via INTRAVENOUS
  Filled 2018-03-14: qty 20

## 2018-03-14 MED ORDER — ACETAMINOPHEN 650 MG RE SUPP: 650.0000 mg | Freq: Four times a day (QID) | RECTAL | Status: DC | PRN

## 2018-03-14 MED ORDER — SODIUM CHLORIDE 0.9% FLUSH
3.0000 mL | Freq: Two times a day (BID) | INTRAVENOUS | Status: DC
  Administered 2018-03-15 – 2018-03-20 (×7): 3 mL via INTRAVENOUS

## 2018-03-14 MED ORDER — ACETAMINOPHEN 325 MG PO TABS
650.0000 mg | ORAL_TABLET | Freq: Four times a day (QID) | ORAL | Status: DC | PRN
  Administered 2018-03-15 – 2018-03-17 (×4): 650 mg via ORAL
  Filled 2018-03-14 (×5): qty 2

## 2018-03-14 MED ORDER — SODIUM CHLORIDE 0.9 % IV BOLUS
1000.0000 mL | Freq: Once | INTRAVENOUS | Status: AC
  Administered 2018-03-14: 1000 mL via INTRAVENOUS

## 2018-03-14 MED ORDER — FENTANYL CITRATE (PF) 100 MCG/2ML IJ SOLN
50.0000 ug | Freq: Once | INTRAMUSCULAR | Status: AC
  Administered 2018-03-14: 50 ug via INTRAVENOUS
  Filled 2018-03-14: qty 2

## 2018-03-14 MED ORDER — ONDANSETRON HCL 4 MG PO TABS: 4.0000 mg | ORAL_TABLET | Freq: Four times a day (QID) | ORAL | Status: DC | PRN

## 2018-03-14 MED ORDER — ACETAMINOPHEN 500 MG PO TABS
1000.0000 mg | ORAL_TABLET | Freq: Once | ORAL | Status: AC
  Administered 2018-03-14: 1000 mg via ORAL
  Filled 2018-03-14: qty 2

## 2018-03-14 NOTE — ED Triage Notes (Signed)
Patient arrives by [facility] with complaints of abdominal pain-patient has diverticulitis and ate fried food and developed severe abdominal pain this evening. EMS administered Fentanyl 100 mcg and Ketamine 17 mg in 50 cc's drip for her severe abdominal pain. Pain now 4/10 after ketamine. No nausea/vomiting-"uncontrolled diarrhea" since 1600 when the severe abdominal pain started.

## 2018-03-14 NOTE — ED Provider Notes (Signed)
[facility] DEPT Provider Note   CSN: [id number] Arrival date & time: 03/14/18  2118     History   Chief Complaint Chief Complaint  Patient presents with  . Abdominal Pain    HPI Tamara [name] is a 57 y.o. female presenting for lower abdominal pain that began at 3 PM today.  Patient states that pain gradually increased until reaching its peak at 4 PM, endorses 10/10 lower abdominal pain at that time along with multiple episodes of diarrhea.  Patient states that the pain is sharp/throbbing in nature and made worse with palpation to the abdomen.  EMS was called and patient given 100 mg of fentanyl and 17 mg of ketamine with great relief of pain, endorsing 4/10 pain upon arrival. Patient denies blood in her stool, states that her stool is green due to chlorophyll tablets that she takes. Patient states that she began having chills today around 4 PM when her diarrhea began.  Patient states that she has a history of diverticulitis, states that this pain feels similar in nature.  Patient denies nausea/vomiting, chest pain, shortness of breath, fever.  HPI  Past Medical History:  Diagnosis Date  . Allergy   . Anemia   . Anxiety   . Asthma   . Depression   . Diverticulitis   . GERD (gastroesophageal reflux disease)   . History of short term memory loss   . HTN (hypertension)   . Hyperlipemia   . Neuromuscular disorder ([place])   . PTSD (post-traumatic stress disorder)   . Shock therapy as cause of abnormal reaction of patient or of later complication without mention of misadventure at time of procedure     Patient Active Problem List   Diagnosis Date Noted  . Diverticulosis of colon without hemorrhage 11/09/2016  . Multinodular non-toxic goiter 02/16/2015  . Migraine 11/05/2013  . Stroke ([place]) 10/22/2013  . ASTHMA 08/01/2010  . Hyperlipidemia 09/17/2007  . OBESITY 09/17/2007  . Anxiety state 09/17/2007  . DEPRESSION 09/17/2007  . Essential  hypertension 09/17/2007  . GERD 09/17/2007    Past Surgical History:  Procedure Laterality Date  . ABDOMINAL HYSTERECTOMY     partial  . CHOLECYSTECTOMY    . COLONOSCOPY    . OTHER SURGICAL HISTORY     electric shock therapy  . TUBAL LIGATION    . UPPER GASTROINTESTINAL ENDOSCOPY       OB History   None      Home Medications    Prior to Admission medications   Medication Sig Start Date End Date Taking? Authorizing Provider  albuterol (PROVENTIL HFA;VENTOLIN HFA) 108 (90 Base) MCG/ACT inhaler Inhale 2 puffs into the lungs every 6 (six) hours as needed for wheezing or shortness of breath. 07/05/15   Leandrew Koyanagi, MD  ALPRAZolam (XANAX XR) 1 MG 24 hr tablet Take 2 mg by mouth 2 (two) times daily.     [provider]  aspirin 325 MG tablet Take 1 tablet (325 mg total) by mouth daily. 10/22/13   Bernadene Bell, MD  baclofen (LIORESAL) 10 MG tablet Take 1 tablet (10 mg total) by mouth 3 (three) times daily. Patient taking differently: Take 10 mg by mouth as needed.  07/05/15   Leandrew Koyanagi, MD  Biotin 5000 MCG CAPS Take 1 capsule by mouth daily.    [provider]  Chlorophyll 100 MG TABS Take 1 tablet by mouth daily.    [provider]  Cholecalciferol (VITAMIN D-3) 1000 UNITS  CAPS Take 1 capsule by mouth daily.    [provider]  Cholecalciferol (VITAMIN D3) 50000 UNITS CAPS Take 50,000 Units by mouth once a week. 09/24/14   [name], MD  dexlansoprazole (DEXILANT) 60 MG capsule Take 1 capsule (60 mg total) by mouth daily. 09/02/14   [name], MD  doxycycline (VIBRA-TABS) 100 MG tablet Take 1 tablet (100 mg total) by mouth 2 (two) times daily. 07/05/15   Leandrew Koyanagi, MD  fenofibrate (TRICOR) 145 MG tablet Take 1 tablet (145 mg total) by mouth daily. 05/22/16   Wardell Honour, MD  Eloise Levels, Camillia sinensis, (GREEN TEA EXTRACT PO) Take 400 mg by mouth daily.    [provider]  losartan (COZAAR) 100 MG  tablet Take 1 tablet (100 mg total) by mouth daily. 09/07/15   [name], MD  LYSINE PO Take 1 tablet by mouth as needed. prn    [provider]  MAGNESIUM BISGLYCINATE PO Take 400 mg by mouth daily.    [provider]  nitroGLYCERIN (NITROSTAT) 0.4 MG SL tablet Place 1 tablet (0.4 mg total) under the tongue every 5 (five) minutes as needed for chest pain. 02/22/17   Croitoru, Mihai, MD  NON FORMULARY R-lipoic acid NA-rala 100mg  qd For glucose control    [provider]  Oxcarbazepine (TRILEPTAL) 300 MG tablet Take 300 mg by mouth daily.    [provider]  ranitidine (ZANTAC) 150 MG tablet Take 1 tablet (150 mg total) by mouth at bedtime. 07/30/17   Irene Shipper, MD  Riboflavin 400 MG CAPS Take 1 capsule by mouth daily.    [provider]  Taurine 1000 MG CAPS Take 1 capsule by mouth daily.    [provider]  temazepam (RESTORIL) 15 MG capsule Take 15 mg by mouth at bedtime.  01/16/17   [provider]  temazepam (RESTORIL) 30 MG capsule Take 30 mg by mouth at bedtime.    [provider]  vitamin C (ASCORBIC ACID) 500 MG tablet Take 500 mg by mouth daily.    [provider]    Family History Family History  Problem Relation Age of Onset  . Arrhythmia Father   . Stroke Father   . Hyperlipidemia Father   . Heart disease Father   . Hypertension Father   . COPD Father   . Coronary artery disease Maternal Grandfather 40       Died 58  . Heart disease Maternal Grandfather   . Hypertension Maternal Grandfather   . Cancer Mother        bone marrow  . Hyperlipidemia Mother   . Hypertension Mother   . Hearing loss Paternal Grandmother   . Hearing loss Paternal Grandfather   . Diabetes Paternal Grandfather   . Diabetes Daughter   . Colon cancer Paternal Uncle   . Hyperlipidemia Brother   . Hyperlipidemia Maternal Grandmother   . Esophageal cancer Neg Hx   . Rectal cancer Neg Hx   . Stomach cancer Neg  Hx     Social History Social History   Tobacco Use  . Smoking status: Never Smoker  . Smokeless tobacco: Never Used  Substance Use Topics  . Alcohol use: No    Alcohol/week: 0.0 standard drinks  . Drug use: No     Allergies   Statins; Adhesive [tape]; Darvon [propoxyphene]; Dicyclomine; Gabapentin; Ivp dye [iodinated diagnostic agents]; Levaquin [levofloxacin in d5w]; Levaquin [levofloxacin]; Oxycodone; Oxycontin [oxycodone hcl]; Propoxyphene hcl; Seroquel [quetiapine fumerate]; Penicillins;  and Sulfonamide derivatives   Review of Systems Review of Systems  Constitutional: Positive for chills. Negative for fever.  Respiratory: Negative.  Negative for shortness of breath.   Cardiovascular: Negative.  Negative for chest pain and leg swelling.  Gastrointestinal: Positive for abdominal pain and diarrhea. Negative for blood in stool, nausea and vomiting.  Genitourinary: Negative.  Negative for dysuria, hematuria, vaginal bleeding and vaginal discharge.  Neurological: Negative.  Negative for dizziness, syncope, weakness, light-headedness, numbness and headaches.  All other systems reviewed and are negative.    Physical Exam Updated Vital Signs BP (!) 110/55 (BP Location: Left Arm)   Pulse (!) 103   Temp 99.8 F (37.7 C) (Oral)   Resp 20   Ht 5\' 2"  (1.575 m)   Wt 83.9 kg   SpO2 100%   BMI 33.84 kg/m   Physical Exam  Constitutional: She is oriented to person, place, and time. She appears well-developed and well-nourished. No distress.  HENT:  Head: Normocephalic and atraumatic.  Right Ear: External ear normal.  Left Ear: External ear normal.  Nose: Nose normal.  Eyes: Pupils are equal, round, and reactive to light. EOM are normal.  Neck: Trachea normal and normal range of motion. No tracheal deviation present.  Cardiovascular: Normal rate, regular rhythm, normal heart sounds and intact distal pulses.  Pulmonary/Chest: Effort normal and breath sounds normal. No  respiratory distress.  Abdominal: Soft. There is tenderness in the suprapubic area and left lower quadrant. There is guarding. There is no rigidity, no rebound, no tenderness at McBurney's point and negative Murphy's sign.    Musculoskeletal: Normal range of motion.  Neurological: She is alert and oriented to person, place, and time.  Skin: Skin is warm and dry.  Psychiatric: She has a normal mood and affect. Her behavior is normal.    ED Treatments / Results  Labs (all labs ordered are listed, but only abnormal results are displayed) Labs Reviewed  LIPASE, BLOOD - Abnormal; Notable for the following components:      Result Value   Lipase 55 (*)    All other components within normal limits  COMPREHENSIVE METABOLIC PANEL - Abnormal; Notable for the following components:   Glucose, Bld 126 (*)    BUN 23 (*)    Creatinine, Ser 1.21 (*)    GFR calc non Af Amer 49 (*)    GFR calc Af Amer 57 (*)    All other components within normal limits  CBC - Abnormal; Notable for the following components:   WBC 11.7 (*)    All other components within normal limits  URINALYSIS, ROUTINE W REFLEX MICROSCOPIC    EKG None  Radiology Ct Abdomen Pelvis Wo Contrast  Result Date: 03/14/2018 CLINICAL DATA:  Abdominal pain EXAM: CT ABDOMEN AND PELVIS WITHOUT CONTRAST TECHNIQUE: Multidetector CT imaging of the abdomen and pelvis was performed following the standard protocol without IV contrast. COMPARISON:  CT 11/12/2016 FINDINGS: Lower chest: Lung bases are clear.  Small to moderate hiatal hernia. Hepatobiliary: Steatosis. Status post cholecystectomy. No biliary dilatation Pancreas: Unremarkable. No pancreatic ductal dilatation or surrounding inflammatory changes. Spleen: Normal in size without focal abnormality. Adrenals/Urinary Tract: Adrenal glands are unremarkable. Kidneys are normal, without renal calculi, focal lesion, or hydronephrosis. Bladder is unremarkable. Stomach/Bowel: Stomach is nonenlarged.  No dilated small bowel. Negative appendix. Mild wall thickening of the distal sigmoid colon with surrounding inflammatory change and fluid. Small foci of extraluminal gas posteriorly. Vascular/Lymphatic: Nonaneurysmal aorta. Mild aortic atherosclerosis. No significant adenopathy Reproductive: Status post  hysterectomy. No adnexal masses. Other: Trace fluid in the pelvis. Musculoskeletal: No acute or significant osseous findings. IMPRESSION: 1. Mild wall thickening with surrounding edema and fluid at the distal sigmoid colon, suspected to represent acute diverticulitis. There are small foci of extraluminal gas in the region, concerning for contained perforation. There is no organized abscess. 2. Hepatic steatosis 3. Moderate hiatal hernia Electronically Signed   By: Donavan Foil M.D.   On: 03/14/2018 22:47    Procedures Procedures (including critical care time)  Medications Ordered in ED Medications  cefTRIAXone (ROCEPHIN) 2 g in sodium chloride 0.9 % 100 mL IVPB (has no administration in time range)    And  metroNIDAZOLE (FLAGYL) IVPB 500 mg (has no administration in time range)  sodium chloride 0.9 % bolus 1,000 mL (0 mLs Intravenous Stopped 03/14/18 2305)  fentaNYL (SUBLIMAZE) injection 50 mcg (50 mcg Intravenous Given 03/14/18 2305)     Initial Impression / Assessment and Plan / ED Course  I have reviewed the triage vital signs and the nursing notes.  Pertinent labs & imaging results that were available during my care of the patient were reviewed by me and considered in my medical decision making (see chart for details).  Clinical Course as of Mar 15 241  Fri Mar 14, 2018  2314 Consult called with Dr. Lucia Gaskins, General Surgery who will see patient in morning.   [BM]  2344 Informed by RN that patient has began itching after administration of Rocephin.  Medication stopped immediately by RN.  Patient reassessed by myself, no acute distress at this time, sitting comfortably in bed.  No stridor  no wheezing no signs of swelling, patient with mild erythema present to arms.  No signs of respiratory distress.  This is been discussed with Dr. Tyrone Nine, will place patient on Cipro instead of Rocephin.Patient is agreeable.   [BM]    Clinical Course User Index [BM] Deliah Boston, PA-C   Patient presenting with lower abdominal pain and chills that began at 3 PM today associated diarrhea.  Pain worsened presented to the emergency department.    IV fluids begun, pain control. CMP shows elevated creatinine, appears baseline. CBC with white count of 11.7. Lipase of 55 Urinalysis pending trace leukocytes and rare bacteria, will be sent for culture.  At this time patient is afebrile, tachy to 103, respiratory rate 20, WBC count 11.7.  Patient does not meet sirs criteria at this time. -------------------------------------------------------------------------------- CT abdomen here shows diverticulitis with concern for perforation.  IV antibiotics begun.  Preferred regimen chosen, patient with history of penicillin allergy on chart, patient cannot recall last time she took penicillin states that she has been informed she had a rash to this medication, denies history of anaphylaxis.  This has been discussed with Dr. Tyrone Nine who advises preferred treatment with Rocephin at this time and monitoring for adverse reactions. Consult to general surgery, Dr. Lucia Gaskins who advises medicine admit for this patient and he will follow-up with the patient in the morning. ------------------------------------------------------------------------------------------- Consult called to medicine for admission.  Patient has been admitted to Dr. Thompson Caul service with [facility] hospitalist.  Informed that patient has begun itching after administration of Rocephin.  Rocephin was immediately stopped by nursing staff.  Patient refuses Benadryl for the itching.  Patient reevaluated multiple times by me over the course of the next hour  approximately still without signs of anaphylaxis, no hives, no wheezing, no shortness of breath, no chest pain, no trouble swallowing or feeling of throat swelling.  This  reaction was discussed with Dr. Tyrone Nine, patient has been changed to ciprofloxacin. ---------------------------------------------------- After patient seen by Dr. [name] while still emergency department patient became febrile, tachycardia increased, sepsis protocol initiated by Dr. [name].  Tylenol provided by ED staff.  Patient still well-appearing, no acute distress sitting comfortably in bed and is agreeable to care plan.  Patient has been admitted to the hospital by Dr. [name] service.   Note: Portions of this report may have been transcribed using voice recognition software. Every effort was made to ensure accuracy; however, inadvertent computerized transcription errors may still be present.  Final Clinical Impressions(s) / ED Diagnoses   Final diagnoses:  Diverticulitis of large intestine with perforation, unspecified bleeding status    ED Discharge Orders    None       Gari Crown 03/15/18 0250    Deno Etienne, DO 03/15/18 1507

## 2018-03-14 NOTE — H&P (Signed)
History and Physical    Tamara [name] YOV:[id number] DOB: 13-Oct-1960 DOA: 03/14/2018  Referring MD/NP/PA: Nuala Alpha, PA-C PCP: Jilda Panda, MD  Patient coming from: Via EMS  Chief Complaint: Abdominal pain  I have personally briefly reviewed patient's old medical records in [place]   HPI: Tamara [name] is a 57 y.o. female with medical history significant of HTN, HLD, major depression, anxiety, PTSD, and GERD who presents with complaints of acute onset of severe lower abdominal pain after eating fried food this afternoon.  She describes as sharp and severe 10 out of 10 pain.  Nothing seemed to relieve pain symptoms and any movement worsen pain.  Associated symptoms included chills and diarrhea.  Last colonoscopy was completed by Dr. Henrene Pastor in 01/2017 where patient was noted to have multiple diverticula with retrosigmoid stenosis and internal hemorrhoids.  En route with EMS patient was given 100 mcg of fentanyl, 17 mg of ketamine with some pain relief.   ED Course: On admission to the emergency department patient was noted to be initially afebrile, pulse 10 3-1 28, respirations 16-22, blood pressure stable, and O2 saturation maintained on room air.  Labs revealed WBC 11.7.  CT scan revealed signs of diverticulitis with small contained perforation.  Sepsis protocol was not initially started and therefore no lactic acid was obtained.  Patient had initially been given 1 L normal saline IV fluids, started on Rocephin and metronidazole.  However, patient developed rash and itching with administration of Rocephin.  Review of Systems  Constitutional: Positive for chills, fever and malaise/fatigue. Negative for weight loss.  HENT: Negative for ear discharge and nosebleeds.   Eyes: Negative for photophobia and discharge.  Respiratory: Negative for cough, hemoptysis and wheezing.   Cardiovascular: Negative for chest pain and leg swelling.  Gastrointestinal: Positive for abdominal  pain and diarrhea. Negative for blood in stool.  Genitourinary: Negative for dysuria and hematuria.  Musculoskeletal: Negative for back pain.  Neurological: Negative for focal weakness and loss of consciousness.  Psychiatric/Behavioral: The patient is nervous/anxious and has insomnia.     Past Medical History:  Diagnosis Date  . Allergy   . Anemia   . Anxiety   . Asthma   . Depression   . Diverticulitis   . GERD (gastroesophageal reflux disease)   . History of short term memory loss   . HTN (hypertension)   . Hyperlipemia   . Neuromuscular disorder ([place])   . PTSD (post-traumatic stress disorder)   . Shock therapy as cause of abnormal reaction of patient or of later complication without mention of misadventure at time of procedure     Past Surgical History:  Procedure Laterality Date  . ABDOMINAL HYSTERECTOMY     partial  . CHOLECYSTECTOMY    . COLONOSCOPY    . OTHER SURGICAL HISTORY     electric shock therapy  . TUBAL LIGATION    . UPPER GASTROINTESTINAL ENDOSCOPY       reports that she has never smoked. She has never used smokeless tobacco. She reports that she does not drink alcohol or use drugs.  Allergies  Allergen Reactions  . Statins Other (See Comments)    Severe mm pain (esp. Arms) >> DOES NOT WANT TO RETRY!  . Adhesive [Tape]     This is tape as well as adhesive on bandaids.  . Darvon [Propoxyphene]   . Dicyclomine     Vision loss in one eye  . Gabapentin     Unknown reaction  .  Ivp Dye [Iodinated Diagnostic Agents] Swelling  . Levaquin [Levofloxacin In D5w] Other (See Comments)    GI upset  . Levaquin [Levofloxacin] Nausea And Vomiting  . Oxycodone   . Oxycontin [Oxycodone Hcl]     Amnesia  . Propoxyphene Hcl Other (See Comments)    hallucination  . Seroquel [Quetiapine Fumerate]     Unknown reaction  . Penicillins Rash  . Sulfonamide Derivatives Rash    Family History  Problem Relation Age of Onset  . Arrhythmia Father   . Stroke Father     . Hyperlipidemia Father   . Heart disease Father   . Hypertension Father   . COPD Father   . Coronary artery disease Maternal Grandfather 40       Died 58  . Heart disease Maternal Grandfather   . Hypertension Maternal Grandfather   . Cancer Mother        bone marrow  . Hyperlipidemia Mother   . Hypertension Mother   . Hearing loss Paternal Grandmother   . Hearing loss Paternal Grandfather   . Diabetes Paternal Grandfather   . Diabetes Daughter   . Colon cancer Paternal Uncle   . Hyperlipidemia Brother   . Hyperlipidemia Maternal Grandmother   . Esophageal cancer Neg Hx   . Rectal cancer Neg Hx   . Stomach cancer Neg Hx     Prior to Admission medications   Medication Sig Start Date End Date Taking? Authorizing Provider  albuterol (PROVENTIL HFA;VENTOLIN HFA) 108 (90 Base) MCG/ACT inhaler Inhale 2 puffs into the lungs every 6 (six) hours as needed for wheezing or shortness of breath. 07/05/15   Leandrew Koyanagi, MD  ALPRAZolam (XANAX XR) 1 MG 24 hr tablet Take 2 mg by mouth 2 (two) times daily.     [provider]  aspirin 325 MG tablet Take 1 tablet (325 mg total) by mouth daily. 10/22/13   Bernadene Bell, MD  baclofen (LIORESAL) 10 MG tablet Take 1 tablet (10 mg total) by mouth 3 (three) times daily. Patient taking differently: Take 10 mg by mouth as needed.  07/05/15   Leandrew Koyanagi, MD  Biotin 5000 MCG CAPS Take 1 capsule by mouth daily.    [provider]  Chlorophyll 100 MG TABS Take 1 tablet by mouth daily.    [provider]  Cholecalciferol (VITAMIN D-3) 1000 UNITS CAPS Take 1 capsule by mouth daily.    [provider]  Cholecalciferol (VITAMIN D3) 50000 UNITS CAPS Take 50,000 Units by mouth once a week. 09/24/14   [name], MD  dexlansoprazole (DEXILANT) 60 MG capsule Take 1 capsule (60 mg total) by mouth daily. 09/02/14   [name], MD  doxycycline (VIBRA-TABS) 100 MG tablet Take 1 tablet (100 mg total) by mouth 2  (two) times daily. 07/05/15   Leandrew Koyanagi, MD  fenofibrate (TRICOR) 145 MG tablet Take 1 tablet (145 mg total) by mouth daily. 05/22/16   Wardell Honour, MD  Eloise Levels, Camillia sinensis, (GREEN TEA EXTRACT PO) Take 400 mg by mouth daily.    [provider]  losartan (COZAAR) 100 MG tablet Take 1 tablet (100 mg total) by mouth daily. 09/07/15   [name], MD  LYSINE PO Take 1 tablet by mouth as needed. prn    [provider]  MAGNESIUM BISGLYCINATE PO Take 400 mg by mouth daily.    [provider]  nitroGLYCERIN (NITROSTAT) 0.4 MG SL tablet Place 1 tablet (0.4 mg total) under the  tongue every 5 (five) minutes as needed for chest pain. 02/22/17   Croitoru, Mihai, MD  NON FORMULARY R-lipoic acid NA-rala 100mg  qd For glucose control    [provider]  Oxcarbazepine (TRILEPTAL) 300 MG tablet Take 300 mg by mouth daily.    [provider]  ranitidine (ZANTAC) 150 MG tablet Take 1 tablet (150 mg total) by mouth at bedtime. 07/30/17   Irene Shipper, MD  Riboflavin 400 MG CAPS Take 1 capsule by mouth daily.    [provider]  Taurine 1000 MG CAPS Take 1 capsule by mouth daily.    [provider]  temazepam (RESTORIL) 15 MG capsule Take 15 mg by mouth at bedtime.  01/16/17   [provider]  temazepam (RESTORIL) 30 MG capsule Take 30 mg by mouth at bedtime.    [provider]  vitamin C (ASCORBIC ACID) 500 MG tablet Take 500 mg by mouth daily.    [provider]    Physical Exam:  Constitutional:Obese female  Vitals:   03/14/18 2130 03/14/18 2134 03/14/18 2138  BP:  (!) 110/55   Pulse:  (!) 103   Resp:  20   Temp:  99.8 F (37.7 C)   TempSrc:  Oral   SpO2: 100% 100%   Weight:   83.9 kg  Height:   5\' 2"  (1.575 m)   Eyes: PERRL, lids and conjunctivae normal ENMT: Mucous membranes are dry. Posterior pharynx clear of any exudate or lesions.   Neck: normal, supple, no masses, no  thyromegaly Respiratory: clear to auscultation bilaterally, no wheezing, no crackles. Normal respiratory effort. No accessory muscle use.  Cardiovascular: Tachycardic, no murmurs / rubs / gallops. No extremity edema. 2+ pedal pulses. No carotid bruits.  Abdomen: Lower abdominal tenderness, no masses palpated. No hepatosplenomegaly. Bowel sounds positive.  Musculoskeletal: no clubbing / cyanosis. No joint deformity upper and lower extremities. Good ROM, no contractures. Normal muscle tone.  Skin: no rashes, lesions, ulcers. No induration Neurologic: CN 2-12 grossly intact. Sensation intact, DTR normal. Strength 5/5 in all 4.  Psychiatric: Normal judgment and insight. Alert and oriented x 3. Normal mood.     Labs on Admission: I have personally reviewed following labs and imaging studies  CBC: Recent Labs  Lab 03/14/18 2147  WBC 11.7*  HGB 13.2  HCT 38.9  MCV 86.3  PLT 734   Basic Metabolic Panel: Recent Labs  Lab 03/14/18 2147  NA 138  K 3.5  CL 103  CO2 22  GLUCOSE 126*  BUN 23*  CREATININE 1.21*  CALCIUM 9.8   GFR: Estimated Creatinine Clearance: 52.1 mL/min (A) (by C-G formula based on SCr of 1.21 mg/dL (H)). Liver Function Tests: Recent Labs  Lab 03/14/18 2147  AST 28  ALT 22  ALKPHOS 78  BILITOT 1.1  PROT 7.5  ALBUMIN 4.2   Recent Labs  Lab 03/14/18 2147  LIPASE 55*   No results for input(s): AMMONIA in the last 168 hours. Coagulation Profile: No results for input(s): INR, PROTIME in the last 168 hours. Cardiac Enzymes: No results for input(s): CKTOTAL, CKMB, CKMBINDEX, TROPONINI in the last 168 hours. BNP (last 3 results) No results for input(s): PROBNP in the last 8760 hours. HbA1C: No results for input(s): HGBA1C in the last 72 hours. CBG: No results for input(s): GLUCAP in the last 168 hours. Lipid Profile: No results for input(s): CHOL, HDL, LDLCALC, TRIG, CHOLHDL, LDLDIRECT in the last 72 hours. Thyroid Function Tests: No results for  input(s): TSH,  T4TOTAL, FREET4, T3FREE, THYROIDAB in the last 72 hours. Anemia Panel: No results for input(s): VITAMINB12, FOLATE, FERRITIN, TIBC, IRON, RETICCTPCT in the last 72 hours. Urine analysis:    Component Value Date/Time   COLORURINE AMBER (A) 03/19/2014 1910   APPEARANCEUR CLEAR 03/19/2014 1910   LABSPEC 1.015 03/19/2014 1910   PHURINE 7.0 03/19/2014 1910   GLUCOSEU NEGATIVE 03/19/2014 1910   GLUCOSEU NEGATIVE 02/16/2010 1117   HGBUR NEGATIVE 03/19/2014 1910   BILIRUBINUR negative 10/05/2015 1530   BILIRUBINUR neg 09/02/2014 1213   KETONESUR negative 10/05/2015 1530   KETONESUR NEGATIVE 03/19/2014 1910   PROTEINUR negative 10/05/2015 1530   PROTEINUR neg 09/02/2014 1213   PROTEINUR NEGATIVE 03/19/2014 1910   UROBILINOGEN 0.2 10/05/2015 1530   UROBILINOGEN 1.0 03/19/2014 1910   NITRITE Negative 10/05/2015 1530   NITRITE neg 09/02/2014 1213   NITRITE NEGATIVE 03/19/2014 1910   LEUKOCYTESUR Negative 10/05/2015 1530   Sepsis Labs: No results found for this or any previous visit (from the past 240 hour(s)).   Radiological Exams on Admission: Ct Abdomen Pelvis Wo Contrast  Result Date: 03/14/2018 CLINICAL DATA:  Abdominal pain EXAM: CT ABDOMEN AND PELVIS WITHOUT CONTRAST TECHNIQUE: Multidetector CT imaging of the abdomen and pelvis was performed following the standard protocol without IV contrast. COMPARISON:  CT 11/12/2016 FINDINGS: Lower chest: Lung bases are clear.  Small to moderate hiatal hernia. Hepatobiliary: Steatosis. Status post cholecystectomy. No biliary dilatation Pancreas: Unremarkable. No pancreatic ductal dilatation or surrounding inflammatory changes. Spleen: Normal in size without focal abnormality. Adrenals/Urinary Tract: Adrenal glands are unremarkable. Kidneys are normal, without renal calculi, focal lesion, or hydronephrosis. Bladder is unremarkable. Stomach/Bowel: Stomach is nonenlarged. No dilated small bowel. Negative appendix. Mild wall thickening of  the distal sigmoid colon with surrounding inflammatory change and fluid. Small foci of extraluminal gas posteriorly. Vascular/Lymphatic: Nonaneurysmal aorta. Mild aortic atherosclerosis. No significant adenopathy Reproductive: Status post hysterectomy. No adnexal masses. Other: Trace fluid in the pelvis. Musculoskeletal: No acute or significant osseous findings. IMPRESSION: 1. Mild wall thickening with surrounding edema and fluid at the distal sigmoid colon, suspected to represent acute diverticulitis. There are small foci of extraluminal gas in the region, concerning for contained perforation. There is no organized abscess. 2. Hepatic steatosis 3. Moderate hiatal hernia Electronically Signed   By: Donavan Foil M.D.   On: 03/14/2018 22:47    EKG: Independently reviewed.  Sinus tachycardia at 121 QTc 613  Assessment/Plan Sepsis 2/2 Diverticulitis with perforation:  Patien initially presented with complaints of abdominal pain and was found to be tachycardic with WBC 11.7.  CT revealed wall thickening of the distal sigmoid colon concerning for diverticulitis with small perforation.  General surgery Dr. Donne Hazel consulted.  Patient had initially been prescribed Rocephin and metronidazole, but developed itching and complaints of rash requesting Rocephin medication to be stopped.  Patient developed fever up to 102 F unclear related to diverticulitis or antibiotics.  Sepsis protocol had not been initially started. - Admit to a telemetry bed - Sepsis protocol initiated - Blood cultures ordered after initial start of antibiotic - NPO except meds - Change antibiotics to ciprofloxacin and metronidazole - 2 L of normal saline IV fluids to complete fluid bolus, then place at a rate of 100 mL/h - Added on lactic acid to previously collected labs - Fentanyl IV as needed pain - Appreciate general surgery consultative services, will follow-up for further recommendation  Chronic kidney disease stage III: Patient  presents with a creatinine of 1.21 with BUN mildly elevated at 23.  Creatinine appears near previous baseline. - Continue to monitor  Anxiety, depression, PTSD - Continue current home regimen  GERD - Pharmacy substitution of Pepcid   DVT prophylaxis: SCDs Code Status: Full Family Communication: Discussed plan of care with the patient family present at bedside Disposition Plan: Likely discharge home if medically stable in 2 to 3 days Consults called: Surgery Admission status: Inpatient  Norval Morton MD [facility] Pager [phone]   If 7PM-7AM, please contact night-coverage [email or web address] Password [facility]  03/14/2018, 11:24 PM

## 2018-03-15 ENCOUNTER — Other Ambulatory Visit: Payer: Self-pay

## 2018-03-15 ENCOUNTER — Inpatient Hospital Stay (HOSPITAL_COMMUNITY): Payer: Medicare Other

## 2018-03-15 ENCOUNTER — Encounter (HOSPITAL_COMMUNITY): Payer: Self-pay

## 2018-03-15 DIAGNOSIS — N183 Chronic kidney disease, stage 3 unspecified: Secondary | ICD-10-CM | POA: Diagnosis present

## 2018-03-15 DIAGNOSIS — K5732 Diverticulitis of large intestine without perforation or abscess without bleeding: Secondary | ICD-10-CM | POA: Diagnosis present

## 2018-03-15 LAB — CBC
HCT: 33.7 % — ABNORMAL LOW (ref 36.0–46.0)
HEMOGLOBIN: 11.4 g/dL — AB (ref 12.0–15.0)
MCH: 29.4 pg (ref 26.0–34.0)
MCHC: 33.8 g/dL (ref 30.0–36.0)
MCV: 86.9 fL (ref 78.0–100.0)
Platelets: 203 10*3/uL (ref 150–400)
RBC: 3.88 MIL/uL (ref 3.87–5.11)
RDW: 13.4 % (ref 11.5–15.5)
WBC: 11.8 10*3/uL — ABNORMAL HIGH (ref 4.0–10.5)

## 2018-03-15 LAB — BASIC METABOLIC PANEL
ANION GAP: 9 (ref 5–15)
BUN: 18 mg/dL (ref 6–20)
CALCIUM: 8.7 mg/dL — AB (ref 8.9–10.3)
CO2: 23 mmol/L (ref 22–32)
Chloride: 109 mmol/L (ref 98–111)
Creatinine, Ser: 1.12 mg/dL — ABNORMAL HIGH (ref 0.44–1.00)
GFR calc Af Amer: >60 mL/min (ref >60)
GFR calc non Af Amer: 54 mL/min — ABNORMAL LOW (ref >60)
GLUCOSE: 119 mg/dL — AB (ref 70–99)
POTASSIUM: 3.7 mmol/L (ref 3.5–5.1)
SODIUM: 141 mmol/L (ref 135–145)

## 2018-03-15 LAB — APTT: APTT: 30 s (ref 24–36)

## 2018-03-15 LAB — LACTIC ACID, PLASMA: LACTIC ACID, VENOUS: 1.7 mmol/L (ref 0.5–1.9)

## 2018-03-15 LAB — PROCALCITONIN: Procalcitonin: <0.1 ng/mL

## 2018-03-15 LAB — PROTIME-INR
INR: 1.02
Prothrombin Time: 13.3 seconds (ref 11.4–15.2)

## 2018-03-15 MED ORDER — KCL IN DEXTROSE-NACL 20-5-0.45 MEQ/L-%-% IV SOLN
INTRAVENOUS | Status: DC
  Administered 2018-03-15: 08:00:00 via INTRAVENOUS
  Filled 2018-03-15 (×3): qty 1000

## 2018-03-15 MED ORDER — SODIUM CHLORIDE 0.9 % IV SOLN
INTRAVENOUS | Status: DC
  Administered 2018-03-15: 04:00:00 via INTRAVENOUS

## 2018-03-15 MED ORDER — TEMAZEPAM 15 MG PO CAPS
45.0000 mg | ORAL_CAPSULE | Freq: Every day | ORAL | Status: DC
  Administered 2018-03-15 – 2018-03-20 (×7): 45 mg via ORAL
  Filled 2018-03-15 (×7): qty 3

## 2018-03-15 MED ORDER — FAMOTIDINE IN NACL 20-0.9 MG/50ML-% IV SOLN
20.0000 mg | Freq: Once | INTRAVENOUS | Status: AC
  Administered 2018-03-15: 20 mg via INTRAVENOUS
  Filled 2018-03-15: qty 50

## 2018-03-15 MED ORDER — SALINE SPRAY 0.65 % NA SOLN
1.0000 | NASAL | Status: DC | PRN
  Filled 2018-03-15: qty 44

## 2018-03-15 MED ORDER — ALPRAZOLAM 0.5 MG PO TABS
0.5000 mg | ORAL_TABLET | Freq: Two times a day (BID) | ORAL | Status: DC | PRN
  Administered 2018-03-16 – 2018-03-19 (×4): 1 mg via ORAL
  Administered 2018-03-19 – 2018-03-21 (×2): 0.5 mg via ORAL
  Filled 2018-03-15: qty 2
  Filled 2018-03-15: qty 1
  Filled 2018-03-15 (×3): qty 2
  Filled 2018-03-15: qty 1

## 2018-03-15 MED ORDER — TEMAZEPAM 15 MG PO CAPS: 30.0000 mg | ORAL_CAPSULE | Freq: Every day | ORAL | Status: DC

## 2018-03-15 MED ORDER — VILAZODONE HCL 10 MG PO TABS
30.0000 mg | ORAL_TABLET | Freq: Every day | ORAL | Status: DC
  Administered 2018-03-15 – 2018-03-21 (×7): 30 mg via ORAL
  Filled 2018-03-15 (×8): qty 3

## 2018-03-15 MED ORDER — FAMOTIDINE 20 MG PO TABS: 20.0000 mg | ORAL_TABLET | Freq: Two times a day (BID) | ORAL | Status: DC

## 2018-03-15 MED ORDER — ALPRAZOLAM ER 1 MG PO TB24
2.0000 mg | ORAL_TABLET | Freq: Every day | ORAL | Status: DC
  Administered 2018-03-15 – 2018-03-20 (×7): 2 mg via ORAL
  Filled 2018-03-15 (×7): qty 2

## 2018-03-15 MED ORDER — LOSARTAN POTASSIUM 50 MG PO TABS
100.0000 mg | ORAL_TABLET | Freq: Every day | ORAL | Status: DC
  Administered 2018-03-15 – 2018-03-21 (×7): 100 mg via ORAL
  Filled 2018-03-15 (×8): qty 2

## 2018-03-15 MED ORDER — VILAZODONE HCL 20 MG PO TABS
30.0000 mg | ORAL_TABLET | Freq: Every day | ORAL | Status: DC
  Filled 2018-03-15: qty 1.5

## 2018-03-15 MED ORDER — MAGNESIUM SULFATE 2 GM/50ML IV SOLN
2.0000 g | Freq: Once | INTRAVENOUS | Status: AC
  Administered 2018-03-15: 2 g via INTRAVENOUS
  Filled 2018-03-15: qty 50

## 2018-03-15 MED ORDER — TEMAZEPAM 15 MG PO CAPS: 15.0000 mg | ORAL_CAPSULE | Freq: Every evening | ORAL | Status: DC | PRN

## 2018-03-15 MED ORDER — HYDROMORPHONE HCL 1 MG/ML IJ SOLN
0.5000 mg | INTRAMUSCULAR | Status: DC | PRN
  Filled 2018-03-15: qty 0.5

## 2018-03-15 MED ORDER — SODIUM CHLORIDE 0.9 % IV SOLN
2.0000 g | INTRAVENOUS | Status: DC
  Administered 2018-03-15: 2 g via INTRAVENOUS
  Filled 2018-03-15: qty 20
  Filled 2018-03-15: qty 2
  Filled 2018-03-15: qty 20

## 2018-03-15 MED ORDER — VILAZODONE HCL 20 MG PO TABS: 20.0000 mg | ORAL_TABLET | Freq: Every day | ORAL | Status: DC

## 2018-03-15 MED ORDER — ALPRAZOLAM ER 1 MG PO TB24: 2.0000 mg | ORAL_TABLET | Freq: Every day | ORAL | Status: DC

## 2018-03-15 MED ORDER — FENTANYL CITRATE (PF) 100 MCG/2ML IJ SOLN
25.0000 ug | INTRAMUSCULAR | Status: DC | PRN
  Administered 2018-03-15 (×2): 25 ug via INTRAVENOUS
  Filled 2018-03-15 (×2): qty 2

## 2018-03-15 MED ORDER — OXCARBAZEPINE 300 MG PO TABS
300.0000 mg | ORAL_TABLET | Freq: Every day | ORAL | Status: DC
  Administered 2018-03-15 – 2018-03-21 (×7): 300 mg via ORAL
  Filled 2018-03-15 (×7): qty 1

## 2018-03-15 MED ORDER — ONDANSETRON HCL 4 MG/2ML IJ SOLN
4.0000 mg | Freq: Four times a day (QID) | INTRAMUSCULAR | Status: DC | PRN
  Administered 2018-03-15: 4 mg via INTRAVENOUS

## 2018-03-15 MED ORDER — ALPRAZOLAM 1 MG PO TABS: 2.0000 mg | ORAL_TABLET | Freq: Every day | ORAL | Status: DC

## 2018-03-15 MED ORDER — METRONIDAZOLE IN NACL 5-0.79 MG/ML-% IV SOLN
500.0000 mg | Freq: Three times a day (TID) | INTRAVENOUS | Status: DC
  Administered 2018-03-15 – 2018-03-21 (×18): 500 mg via INTRAVENOUS
  Filled 2018-03-15 (×19): qty 100

## 2018-03-15 NOTE — ED Notes (Signed)
ED TO INPATIENT HANDOFF REPORT  Name/Age/Gender Tamara [name] 57 y.o. female  Code Status    Code Status Orders  (From admission, onward)         Start     Ordered   03/14/18 2347  Full code  Continuous     03/14/18 2350        Code Status History    Date Active Date Inactive Code Status Order ID Comments User Context   10/22/2013 0220 10/22/2013 2026 Full Code [id number]  Sonnenberg, Eric G, MD Inpatient      Home/SNF/Other Home  Chief Complaint abdominal pain  Level of Care/Admitting Diagnosis ED Disposition    ED Disposition Condition Comment   Admit  Hospital Area: [facility] [[id number]]  Level of Care: Telemetry [5]  Admit to tele based on following criteria: Complex arrhythmia (Bradycardia/Tachycardia)  Diagnosis: Sepsis (HCC) [1191708]  Admitting Physician: SMITH, RONDELL A [[id number]]  Attending Physician: SMITH, RONDELL A [[id number]]  Estimated length of stay: past midnight tomorrow  Certification:: I certify this patient will need inpatient services for at least 2 midnights  PT Class (Do Not Modify): Inpatient [101]  PT Acc Code (Do Not Modify): Private [1]       Medical History Past Medical History:  Diagnosis Date  . Allergy   . Anemia   . Anxiety   . Asthma   . Depression   . Diverticulitis   . GERD (gastroesophageal reflux disease)   . History of short term memory loss   . HTN (hypertension)   . Hyperlipemia   . Neuromuscular disorder (HCC)   . PTSD (post-traumatic stress disorder)   . Shock therapy as cause of abnormal reaction of patient or of later complication without mention of misadventure at time of procedure     Allergies Allergies  Allergen Reactions  . Statins Other (See Comments)    Severe mm pain (esp. Arms) >> DOES NOT WANT TO RETRY!  . Adhesive [Tape]     This is tape as well as adhesive on bandaids.  . Darvon [Propoxyphene] Other (See Comments)    Hallucinations   . Dicyclomine     Vision loss in  one eye  . Gabapentin     Unknown reaction  . Ivp Dye [Iodinated Diagnostic Agents] Swelling  . Levaquin [Levofloxacin In D5w] Other (See Comments)    GI upset  . Levaquin [Levofloxacin] Nausea And Vomiting  . Oxycodone Other (See Comments)    amnesia  . Oxycontin [Oxycodone Hcl]     Amnesia  . Propoxyphene Hcl Other (See Comments)    hallucination  . Seroquel [Quetiapine Fumerate]     Unknown reaction  . Ceftriaxone Rash  . Penicillins Rash    Has patient had a PCN reaction causing immediate rash, facial/tongue/throat swelling, SOB or lightheadedness with hypotension: No Has patient had a PCN reaction causing severe rash involving mucus membranes or skin necrosis: No Has patient had a PCN reaction that required hospitalization: No Has patient had a PCN reaction occurring within the last 10 years: No If all of the above answers are "NO", then may proceed with Cephalosporin use.   . Sulfonamide Derivatives Rash    IV Location/Drains/Wounds Patient Lines/Drains/Airways Status   Active Line/Drains/Airways    Name:   Placement date:   Placement time:   Site:   Days:   Peripheral IV 03/14/18 Right Antecubital   03/14/18    -    Antecubital   1            Labs/Imaging Results for orders placed or performed during the hospital encounter of 03/14/18 (from the past 48 hour(s))  Lipase, blood     Status: Abnormal   Collection Time: 03/14/18  9:47 PM  Result Value Ref Range   Lipase 55 (H) 11 - 51 U/L    Comment: Performed at [facility], [place] [address], [place], [place] [address]  Comprehensive metabolic panel     Status: Abnormal   Collection Time: 03/14/18  9:47 PM  Result Value Ref Range   Sodium 138 135 - 145 mmol/L   Potassium 3.5 3.5 - 5.1 mmol/L   Chloride 103 98 - 111 mmol/L   CO2 22 22 - 32 mmol/L   Glucose, Bld 126 (H) 70 - 99 mg/dL   BUN 23 (H) 6 - 20 mg/dL   Creatinine, Ser 1.21 (H) 0.44 - 1.00 mg/dL   Calcium 9.8 8.9 - 10.3 mg/dL   Total  Protein 7.5 6.5 - 8.1 g/dL   Albumin 4.2 3.5 - 5.0 g/dL   AST 28 15 - 41 U/L   ALT 22 0 - 44 U/L   Alkaline Phosphatase 78 38 - 126 U/L   Total Bilirubin 1.1 0.3 - 1.2 mg/dL   GFR calc non Af Amer 49 (L) >60 mL/min   GFR calc Af Amer 57 (L) >60 mL/min    Comment: (NOTE) The eGFR has been calculated using the CKD EPI equation. This calculation has not been validated in all clinical situations. eGFR's persistently <60 mL/min signify possible Chronic Kidney Disease.    Anion gap 13 5 - 15    Comment: Performed at [facility], [place] [address], [place], [place] [address]  CBC     Status: Abnormal   Collection Time: 03/14/18  9:47 PM  Result Value Ref Range   WBC 11.7 (H) 4.0 - 10.5 K/uL   RBC 4.51 3.87 - 5.11 MIL/uL   Hemoglobin 13.2 12.0 - 15.0 g/dL   HCT 38.9 36.0 - 46.0 %   MCV 86.3 78.0 - 100.0 fL   MCH 29.3 26.0 - 34.0 pg   MCHC 33.9 30.0 - 36.0 g/dL   RDW 13.2 11.5 - 15.5 %   Platelets 252 150 - 400 K/uL    Comment: Performed at [facility], [place] [address], [place], [place] [address]  Urinalysis, Routine w reflex microscopic     Status: Abnormal   Collection Time: 03/14/18  9:47 PM  Result Value Ref Range   Color, Urine YELLOW YELLOW   APPearance CLEAR CLEAR   Specific Gravity, Urine 1.012 1.005 - 1.030   pH 8.0 5.0 - 8.0   Glucose, UA NEGATIVE NEGATIVE mg/dL   Hgb urine dipstick NEGATIVE NEGATIVE   Bilirubin Urine NEGATIVE NEGATIVE   Ketones, ur NEGATIVE NEGATIVE mg/dL   Protein, ur NEGATIVE NEGATIVE mg/dL   Nitrite NEGATIVE NEGATIVE   Leukocytes, UA TRACE (A) NEGATIVE   RBC / HPF 0-5 0 - 5 RBC/hpf   WBC, UA 0-5 0 - 5 WBC/hpf   Bacteria, UA RARE (A) NONE SEEN   Squamous Epithelial / LPF 0-5 0 - 5   Mucus PRESENT     Comment: Performed at [facility], [place] [address] [place], [place] [address]   Ct Abdomen Pelvis Wo Contrast  Result Date: 03/14/2018 CLINICAL DATA:  Abdominal pain EXAM: CT ABDOMEN AND  PELVIS WITHOUT CONTRAST TECHNIQUE: Multidetector CT imaging of the abdomen and pelvis was performed following the standard protocol without IV  contrast. COMPARISON:  CT 11/12/2016 FINDINGS: Lower chest: Lung bases are clear.  Small to moderate hiatal hernia. Hepatobiliary: Steatosis. Status post cholecystectomy. No biliary dilatation Pancreas: Unremarkable. No pancreatic ductal dilatation or surrounding inflammatory changes. Spleen: Normal in size without focal abnormality. Adrenals/Urinary Tract: Adrenal glands are unremarkable. Kidneys are normal, without renal calculi, focal lesion, or hydronephrosis. Bladder is unremarkable. Stomach/Bowel: Stomach is nonenlarged. No dilated small bowel. Negative appendix. Mild wall thickening of the distal sigmoid colon with surrounding inflammatory change and fluid. Small foci of extraluminal gas posteriorly. Vascular/Lymphatic: Nonaneurysmal aorta. Mild aortic atherosclerosis. No significant adenopathy Reproductive: Status post hysterectomy. No adnexal masses. Other: Trace fluid in the pelvis. Musculoskeletal: No acute or significant osseous findings. IMPRESSION: 1. Mild wall thickening with surrounding edema and fluid at the distal sigmoid colon, suspected to represent acute diverticulitis. There are small foci of extraluminal gas in the region, concerning for contained perforation. There is no organized abscess. 2. Hepatic steatosis 3. Moderate hiatal hernia Electronically Signed   By: Kim  Fujinaga M.D.   On: 03/14/2018 22:47    Pending Labs Unresulted Labs (From admission, onward)    Start     Ordered   03/15/18 0500  HIV antibody (Routine Testing)  Tomorrow morning,   R     03/14/18 2350   03/15/18 0500  CBC  Tomorrow morning,   R     03/14/18 2350   03/15/18 0500  Basic metabolic panel  Tomorrow morning,   R     03/14/18 2350   03/14/18 2349  Lactic acid, plasma  Add-on,   STAT     03/14/18 2350   03/14/18 2349  Procalcitonin  Add-on,   R     03/14/18 2350    03/14/18 2349  Protime-INR  Add-on,   R     03/14/18 2350   03/14/18 2349  APTT  Add-on,   R     03/14/18 2350   03/14/18 2346  Culture, blood (x 2)  BLOOD CULTURE X 2,   STAT    Comments:  INITIATE ANTIBIOTICS WITHIN 1 HOUR AFTER BLOOD CULTURES DRAWN.  If unable to obtain blood cultures, call MD immediately regarding antibiotic instructions.    03/14/18 2350   03/14/18 2336  Urine culture  STAT,   STAT     03/14/18 2335          Vitals/Pain Today's Vitals   03/14/18 2134 03/14/18 2138 03/14/18 2335 03/14/18 2336  BP: (!) 110/55   132/76  Pulse: (!) 103   (!) 126  Resp: 20   16  Temp: 99.8 F (37.7 C)   (!) 102 F (38.9 C)  TempSrc: Oral   Oral  SpO2: 100%   100%  Weight:  83.9 kg    Height:  5' 2" (1.575 m)    PainSc: 4   2      Isolation Precautions No active isolations  Medications Medications  cefTRIAXone (ROCEPHIN) 2 g in sodium chloride 0.9 % 100 mL IVPB (0 g Intravenous Stopped 03/14/18 2337)    And  metroNIDAZOLE (FLAGYL) IVPB 500 mg (500 mg Intravenous New Bag/Given 03/14/18 2338)  ciprofloxacin (CIPRO) IVPB 400 mg (has no administration in time range)  ondansetron (ZOFRAN) injection 4 mg (0 mg Intravenous Hold 03/15/18 0022)  sodium chloride flush (NS) 0.9 % injection 3 mL (has no administration in time range)  ondansetron (ZOFRAN) tablet 4 mg (has no administration in time range)    Or  ondansetron (ZOFRAN) injection 4 mg (has no   administration in time range)  acetaminophen (TYLENOL) tablet 650 mg (has no administration in time range)    Or  acetaminophen (TYLENOL) suppository 650 mg (has no administration in time range)  albuterol (PROVENTIL) (2.5 MG/3ML) 0.083% nebulizer solution 2.5 mg (has no administration in time range)  sodium chloride 0.9 % bolus 2,000 mL (has no administration in time range)  sodium chloride 0.9 % bolus 1,000 mL (0 mLs Intravenous Stopped 03/14/18 2305)  fentaNYL (SUBLIMAZE) injection 50 mcg (50 mcg Intravenous Given 03/14/18 2305)   acetaminophen (TYLENOL) tablet 1,000 mg (1,000 mg Oral Given 03/14/18 2348)    Mobility walks with person assist 

## 2018-03-15 NOTE — Consult Note (Signed)
Re:   Tamara [name] DOB:   October 04, 1960 MRN:   [id number]  Chief Complaint Abdominal pain  ASSESEMENT AND PLAN: 1.  Diverticulitis with focal perforation  Plan:  NPO, IVF, antibiotics and follow up exams.  2.  Depression/Anxiety 3.  GERD with moderate sized HH 4.  HTN 5.  PTSD 6.  Obesity  Chief Complaint  Patient presents with  . Abdominal Pain   PHYSICIAN REQUESTING CONSULTATION:  Nuala Alpha, PA, [facility]  HISTORY OF PRESENT ILLNESS: Tamara [name] is a 57 y.o. (DOB: 08/13/1960)  white female whose primary care physician is Jilda Panda, MD.   She had a bout of diverticulitis over 5 years ago.  She has seen Dr. Scarlette Shorts for gastroenterology.  She had a colonoscopy within the last year that showed diverticulosis.     She developed sever pain after eating fried food on Friday, 9/13.  She denies a history of stomach, liver or pancreas disease.  She had an open cystectomy in the 1980s.  She had a partial hysterectomy for uterine fibroids.  CT scan - 03/14/2018 - 1. Mild wall thickening with surrounding edema and fluid at the distal sigmoid colon, suspected to represent acute diverticulitis. There are small foci of extraluminal gas in the region, concerning for contained perforation. There is no organized abscess. 2. Hepatic steatosis 3. Moderate hiatal hernia   Past Medical History:  Diagnosis Date  . Allergy   . Anemia   . Anxiety   . Asthma   . Depression   . Diverticulitis   . GERD (gastroesophageal reflux disease)   . History of short term memory loss   . HTN (hypertension)   . Hyperlipemia   . Neuromuscular disorder ([place])   . PTSD (post-traumatic stress disorder)   . Shock therapy as cause of abnormal reaction of patient or of later complication without mention of misadventure at time of procedure       Past Surgical History:  Procedure Laterality Date  . ABDOMINAL HYSTERECTOMY     partial  . CHOLECYSTECTOMY    . COLONOSCOPY    . OTHER SURGICAL  HISTORY     electric shock therapy  . TUBAL LIGATION    . UPPER GASTROINTESTINAL ENDOSCOPY        Current Facility-Administered Medications  Medication Dose Route Frequency Provider Last Rate Last Dose  . 0.9 %  sodium chloride infusion   Intravenous Continuous Fuller Plan A, MD 75 mL/hr at 03/15/18 0341    . acetaminophen (TYLENOL) tablet 650 mg  650 mg Oral Q6H PRN Norval Morton, MD       Or  . acetaminophen (TYLENOL) suppository 650 mg  650 mg Rectal Q6H PRN Smith, Rondell A, MD      . albuterol (PROVENTIL) (2.5 MG/3ML) 0.083% nebulizer solution 2.5 mg  2.5 mg Nebulization Q6H PRN [name], Rondell A, MD      . ALPRAZolam (XANAX XR) 24 hr tablet 2 mg  2 mg Oral QHS Smith, Rondell A, MD   2 mg at 03/15/18 0327  . ALPRAZolam Duanne Moron) tablet 0.5-1 mg  0.5-1 mg Oral BID PRN Fuller Plan A, MD      . ciprofloxacin (CIPRO) IVPB 400 mg  400 mg Intravenous BID [name], Rondell A, MD 200 mL/hr at 03/15/18 0208 400 mg at 03/15/18 0208  . famotidine (PEPCID) tablet 20 mg  20 mg Oral BID Smith, Rondell A, MD      . fentaNYL (SUBLIMAZE) injection 25 mcg  25 mcg Intravenous  Q2H PRN Fuller Plan A, MD      . losartan (COZAAR) tablet 100 mg  100 mg Oral Daily Smith, Rondell A, MD      . ondansetron (ZOFRAN) injection 4 mg  4 mg Intravenous Once Norval Morton, MD   Stopped at 03/15/18 0022  . ondansetron (ZOFRAN) tablet 4 mg  4 mg Oral Q6H PRN Fuller Plan A, MD       Or  . ondansetron (ZOFRAN) injection 4 mg  4 mg Intravenous Q6H PRN Fuller Plan A, MD      . Oxcarbazepine (TRILEPTAL) tablet 300 mg  300 mg Oral Daily Smith, Rondell A, MD      . sodium chloride flush (NS) 0.9 % injection 3 mL  3 mL Intravenous Q12H Smith, Rondell A, MD   3 mL at 03/15/18 0548  . temazepam (RESTORIL) capsule 45 mg  45 mg Oral QHS Smith, Rondell A, MD   45 mg at 03/15/18 0327  . Vilazodone HCl (VIIBRYD) TABS 30 mg  30 mg Oral Daily Norval Morton, MD          Allergies  Allergen Reactions  . Statins  Other (See Comments)    Severe mm pain (esp. Arms) >> DOES NOT WANT TO RETRY!  . Adhesive [Tape]     This is tape as well as adhesive on bandaids.  . Darvon [Propoxyphene] Other (See Comments)    Hallucinations   . Dicyclomine     Vision loss in one eye  . Gabapentin     Unknown reaction  . Ivp Dye [Iodinated Diagnostic Agents] Swelling  . Levaquin [Levofloxacin In D5w] Other (See Comments)    GI upset  . Levaquin [Levofloxacin] Nausea And Vomiting  . Oxycodone Other (See Comments)    amnesia  . Oxycontin [Oxycodone Hcl]     Amnesia  . Propoxyphene Hcl Other (See Comments)    hallucination  . Seroquel [Quetiapine Fumerate]     Unknown reaction  . Ceftriaxone Rash  . Penicillins Rash    Has patient had a PCN reaction causing immediate rash, facial/tongue/throat swelling, SOB or lightheadedness with hypotension: No Has patient had a PCN reaction causing severe rash involving mucus membranes or skin necrosis: No Has patient had a PCN reaction that required hospitalization: No Has patient had a PCN reaction occurring within the last 10 years: No If all of the above answers are "NO", then may proceed with Cephalosporin use.   . Sulfonamide Derivatives Rash    REVIEW OF SYSTEMS: Skin:  No history of rash.  No history of abnormal moles. Infection:  No history of hepatitis or HIV.  No history of MRSA. Neurologic:  No history of stroke.  No history of seizure.  No history of headaches. Cardiac:  HTN x 20 years.  Has seen Dr. Sallyanne Kuster for atypical chest pain. Myoview perfusion scan - 02/27/2017 - negative Pulmonary:  Does not smoke cigarettes.  No asthma or bronchitis.  No OSA/CPAP.  Endocrine:  No diabetes. No thyroid disease. Gastrointestinal:  See HPI Urologic:  Remote history of kidney stones (20 years ago) Musculoskeletal:  No history of joint or back disease. Hematologic:  No bleeding disorder.  No history of anemia.  Not anticoagulated. Psycho-social:  The patient is  oriented.   She sees Dr. Casimiro Needle for PTSD (from divorce) and depression. She has had ECT.  SOCIAL and FAMILY HISTORY: Boy friend - Hayes Ludwig of 20 years On disability x 10 years. She takes care of her step  mother and this interferes with that care.  PHYSICAL EXAM: BP 121/80 (BP Location: Left Arm)   Pulse (!) 105   Temp 100 F (37.8 C)   Resp 16   Ht 5\' 2"  (1.575 m)   Wt 88 kg   SpO2 99%   BMI 35.48 kg/m   General: Obese WF who is alert and generally healthy appearing.  Skin:  Inspection and palpation - no mass or rash. Eyes:  Conjunctiva and lids unremarkable.            Pupils are equal Ears, Nose, Mouth, and Throat:  Ears and nose unremarkable            Lips and teeth are unremarable. Neck: Supple. No mass, trachea midline.  No thyroid mass. Lymph Nodes:  No supraclavicular, cervical, or inguinal nodes. Lungs: Normal respiratory effort.  Clear to auscultation and symmetric breath sounds. Heart:  Palpation of the heart is normal.            Auscultation: RRR. No murmur or rub.  Abdomen: Soft. No mass. No hernia.             Right subcostal incsion.  Tenderness in the lower 1/2 of her abdomen.  No peritoneal sxes. Rectal: Not done. Musculoskeletal:  Good muscle strength and ROM  in upper and lower extremities.  Neurologic:  Grossly intact to motor and sensory function. Psychiatric: Normal judgement and insight. Behavior is normal.            Oriented to time, person, place.   DATA REVIEWED, COUNSELING AND COORDINATION OF CARE: Epic notes reviewed. Counseling and coordination of care exceeded more than 50% of the time spent with patient. Total time spent with patient and charting: 45 minutes  Alphonsa Overall, MD,  [facility], [address]  [place], [place]    [place] Phone:  [phone] FAX:  [phone]

## 2018-03-15 NOTE — Progress Notes (Signed)
PROGRESS NOTE    Tamara [name]  OAC:[id number] DOB: Dec 10, 1960 DOA: 03/14/2018 PCP: Jilda Panda, MD   Brief Narrative: Patient is a 57 year old female with past medical history of hypertension, hyperlipidemia, depression, anxiety, PTSD, GERD, diverticulitis who presents to the emergency department complaints of acute onset of severe lower abdominal pain.  Patient was found to have diverticulitis with a small contained perforation as per the CT imaging.  General surgery consulted.  Currently she is n.p.o.  On antibiotics.  Assessment & Plan:   Principal Problem:   Sepsis ([place]) Active Problems:   Anxiety   Diverticulitis of colon with perforation   CKD (chronic kidney disease), stage III ([place])  Diverticulitis with perforation: CT scan showed wall thickening of the distal sigmoid colon concerning for diverticulitis with a small contained perforation.  Presented with abdominal pain, tachycardia and mild leukocytosis.  She feels much better today.  General surgery following.  Plan for conservative management.  Currently she is n.p.o.  Continue antibiotics.  Patient has mild grade fever this morning.  Continue IV fluids.  Pain management.  Chronic kidney disease stage III: Currently kidney function is on baseline.  Continue IV fluids.  History of anxiety/depression/PTSD: On bunch of home medications which are on hold due to current n.p.o. Status.  GERD: Currently on Pepcid   DVT prophylaxis: SCD Code Status: Full Family Communication: None present at the bedside Disposition Plan: Home after resolution of diverticulitis, perforation, surgical clearance   Consultants: General surgery  Procedures: None  Antimicrobials: Ceftriaxone and Flagyl  Subjective: Patient seen and examined at bedside this morning.  Abdominal pain has much improved.  But still has some.  Looked comfortable.  She does not have any bowel movement today.  Has not plassed flatus. Objective: Vitals:   03/15/18  0213 03/15/18 0530 03/15/18 0612 03/15/18 1327  BP:   121/80 107/64  Pulse: (!) 117 (!) 103 (!) 105 (!) 102  Resp:   16   Temp:   100 F (37.8 C) 99.5 F (37.5 C)  TempSrc:    Oral  SpO2:   99% 99%  Weight:      Height:        Intake/Output Summary (Last 24 hours) at 03/15/2018 1333 Last data filed at 03/15/2018 1000 Gross per 24 hour  Intake 895.66 ml  Output 1000 ml  Net -104.34 ml   Filed Weights   03/14/18 2138 03/15/18 0104  Weight: 83.9 kg 88 kg    Examination:  General exam: Appears calm and comfortable ,Not in distress,obese HEENT:PERRL,Oral mucosa moist, Ear/Nose normal on gross exam Respiratory system: Bilateral equal air entry, normal vesicular breath sounds, no wheezes or crackles  Cardiovascular system: S1 & S2 heard, RRR. No JVD, murmurs, rubs, gallops or clicks. No pedal edema. Gastrointestinal system: Abdomen is nondistended, soft .  Tenderness on the left lower quadrant. No organomegaly or masses felt.  Bowel sounds are sluggish  Central nervous system: Alert and oriented. No focal neurological deficits. Extremities: No edema, no clubbing ,no cyanosis, distal peripheral pulses palpable. Skin: No rashes, lesions or ulcers,no icterus ,no pallor MSK: Normal muscle bulk,tone ,power Psychiatry: Judgement and insight appear normal. Mood & affect appropriate.     Data Reviewed: I have personally reviewed following labs and imaging studies  CBC: Recent Labs  Lab 03/14/18 2147 03/15/18 0441  WBC 11.7* 11.8*  HGB 13.2 11.4*  HCT 38.9 33.7*  MCV 86.3 86.9  PLT 252 010   Basic Metabolic Panel: Recent Labs  Lab 03/14/18 2147  03/15/18 0441  NA 138 141  K 3.5 3.7  CL 103 109  CO2 22 23  GLUCOSE 126* 119*  BUN 23* 18  CREATININE 1.21* 1.12*  CALCIUM 9.8 8.7*   GFR: Estimated Creatinine Clearance: 57.8 mL/min (A) (by C-G formula based on SCr of 1.12 mg/dL (H)). Liver Function Tests: Recent Labs  Lab 03/14/18 2147  AST 28  ALT 22  ALKPHOS 78    BILITOT 1.1  PROT 7.5  ALBUMIN 4.2   Recent Labs  Lab 03/14/18 2147  LIPASE 55*   No results for input(s): AMMONIA in the last 168 hours. Coagulation Profile: Recent Labs  Lab 03/14/18 2147  INR 1.02   Cardiac Enzymes: No results for input(s): CKTOTAL, CKMB, CKMBINDEX, TROPONINI in the last 168 hours. BNP (last 3 results) No results for input(s): PROBNP in the last 8760 hours. HbA1C: No results for input(s): HGBA1C in the last 72 hours. CBG: No results for input(s): GLUCAP in the last 168 hours. Lipid Profile: No results for input(s): CHOL, HDL, LDLCALC, TRIG, CHOLHDL, LDLDIRECT in the last 72 hours. Thyroid Function Tests: No results for input(s): TSH, T4TOTAL, FREET4, T3FREE, THYROIDAB in the last 72 hours. Anemia Panel: No results for input(s): VITAMINB12, FOLATE, FERRITIN, TIBC, IRON, RETICCTPCT in the last 72 hours. Sepsis Labs: Recent Labs  Lab 03/14/18 2147 03/15/18 0136  PROCALCITON <0.10  --   LATICACIDVEN  --  1.7    Recent Results (from the past 240 hour(s))  Culture, blood (x 2)     Status: None (Preliminary result)   Collection Time: 03/15/18  1:36 AM  Result Value Ref Range Status   Specimen Description BLOOD RIGHT HAND  Final   Special Requests   Final    BOTTLES DRAWN AEROBIC AND ANAEROBIC Blood Culture adequate volume Performed at [facility], [address], [place], [place] [address]    Culture PENDING  Incomplete   Report Status PENDING  Incomplete  Culture, blood (x 2)     Status: None (Preliminary result)   Collection Time: 03/15/18  1:36 AM  Result Value Ref Range Status   Specimen Description BLOOD LEFT HAND  Final   Special Requests   Final    BOTTLES DRAWN AEROBIC AND ANAEROBIC Blood Culture adequate volume Performed at [facility], [address] [place], [place] [address]    Culture PENDING  Incomplete   Report Status PENDING  Incomplete         Radiology Studies: Ct Abdomen Pelvis Wo Contrast  Result  Date: 03/14/2018 CLINICAL DATA:  Abdominal pain EXAM: CT ABDOMEN AND PELVIS WITHOUT CONTRAST TECHNIQUE: Multidetector CT imaging of the abdomen and pelvis was performed following the standard protocol without IV contrast. COMPARISON:  CT 11/12/2016 FINDINGS: Lower chest: Lung bases are clear.  Small to moderate hiatal hernia. Hepatobiliary: Steatosis. Status post cholecystectomy. No biliary dilatation Pancreas: Unremarkable. No pancreatic ductal dilatation or surrounding inflammatory changes. Spleen: Normal in size without focal abnormality. Adrenals/Urinary Tract: Adrenal glands are unremarkable. Kidneys are normal, without renal calculi, focal lesion, or hydronephrosis. Bladder is unremarkable. Stomach/Bowel: Stomach is nonenlarged. No dilated small bowel. Negative appendix. Mild wall thickening of the distal sigmoid colon with surrounding inflammatory change and fluid. Small foci of extraluminal gas posteriorly. Vascular/Lymphatic: Nonaneurysmal aorta. Mild aortic atherosclerosis. No significant adenopathy Reproductive: Status post hysterectomy. No adnexal masses. Other: Trace fluid in the pelvis. Musculoskeletal: No acute or significant osseous findings. IMPRESSION: 1. Mild wall thickening with surrounding edema and fluid at the distal sigmoid  colon, suspected to represent acute diverticulitis. There are small foci of extraluminal gas in the region, concerning for contained perforation. There is no organized abscess. 2. Hepatic steatosis 3. Moderate hiatal hernia Electronically Signed   By: Donavan Foil M.D.   On: 03/14/2018 22:47        Scheduled Meds: . ALPRAZolam  2 mg Oral QHS  . losartan  100 mg Oral Daily  . ondansetron (ZOFRAN) IV  4 mg Intravenous Once  . Oxcarbazepine  300 mg Oral Daily  . sodium chloride flush  3 mL Intravenous Q12H  . temazepam  45 mg Oral QHS  . Vilazodone HCl  30 mg Oral Daily   Continuous Infusions: . cefTRIAXone (ROCEPHIN)  IV     And  . metronidazole 500 mg  (03/15/18 0840)  . dextrose 5 % and 0.45 % NaCl with KCl 20 mEq/L 125 mL/hr at 03/15/18 0730     LOS: 1 day    Time spent: 25 mins.More than 50% of that time was spent in counseling and/or coordination of care.      Shelly Coss, MD [facility] Pager [phone]  If 7PM-7AM, please contact night-coverage [email or web address] Password [email or web address] 03/15/2018, 1:33 PM

## 2018-03-16 LAB — BASIC METABOLIC PANEL
ANION GAP: 9 (ref 5–15)
BUN: 9 mg/dL (ref 6–20)
CALCIUM: 8.8 mg/dL — AB (ref 8.9–10.3)
CO2: 22 mmol/L (ref 22–32)
Chloride: 110 mmol/L (ref 98–111)
Creatinine, Ser: 0.98 mg/dL (ref 0.44–1.00)
GFR calc Af Amer: >60 mL/min (ref >60)
GLUCOSE: 84 mg/dL (ref 70–99)
POTASSIUM: 3.2 mmol/L — AB (ref 3.5–5.1)
Sodium: 141 mmol/L (ref 135–145)

## 2018-03-16 LAB — CBC WITH DIFFERENTIAL/PLATELET
BASOS ABS: 0 10*3/uL (ref 0.0–0.1)
BASOS PCT: 0 %
Eosinophils Absolute: 0.1 10*3/uL (ref 0.0–0.7)
Eosinophils Relative: 1 %
HEMATOCRIT: 35.2 % — AB (ref 36.0–46.0)
Hemoglobin: 11.5 g/dL — ABNORMAL LOW (ref 12.0–15.0)
LYMPHS PCT: 18 %
Lymphs Abs: 1.8 10*3/uL (ref 0.7–4.0)
MCH: 29.1 pg (ref 26.0–34.0)
MCHC: 32.7 g/dL (ref 30.0–36.0)
MCV: 89.1 fL (ref 78.0–100.0)
Monocytes Absolute: 0.7 10*3/uL (ref 0.1–1.0)
Monocytes Relative: 7 %
NEUTROS ABS: 7.6 10*3/uL (ref 1.7–7.7)
Neutrophils Relative %: 74 %
PLATELETS: 214 10*3/uL (ref 150–400)
RBC: 3.95 MIL/uL (ref 3.87–5.11)
RDW: 13.4 % (ref 11.5–15.5)
WBC: 10.3 10*3/uL (ref 4.0–10.5)

## 2018-03-16 LAB — URINE CULTURE: CULTURE: NO GROWTH

## 2018-03-16 MED ORDER — DEXTROSE-NACL 5-0.9 % IV SOLN
INTRAVENOUS | Status: DC
  Administered 2018-03-16 – 2018-03-21 (×7): via INTRAVENOUS

## 2018-03-16 MED ORDER — POTASSIUM CHLORIDE 10 MEQ/100ML IV SOLN: 10.0000 meq | INTRAVENOUS | Status: DC

## 2018-03-16 MED ORDER — ENOXAPARIN SODIUM 40 MG/0.4ML ~~LOC~~ SOLN
40.0000 mg | SUBCUTANEOUS | Status: DC
  Administered 2018-03-16 – 2018-03-21 (×6): 40 mg via SUBCUTANEOUS
  Filled 2018-03-16 (×6): qty 0.4

## 2018-03-16 MED ORDER — POTASSIUM CHLORIDE 20 MEQ PO PACK: 20.0000 meq | PACK | Freq: Two times a day (BID) | ORAL | Status: DC

## 2018-03-16 MED ORDER — POTASSIUM CHLORIDE 10 MEQ/100ML IV SOLN
10.0000 meq | INTRAVENOUS | Status: DC
  Administered 2018-03-16: 10 meq via INTRAVENOUS
  Filled 2018-03-16: qty 100

## 2018-03-16 MED ORDER — POTASSIUM CHLORIDE CRYS ER 20 MEQ PO TBCR
40.0000 meq | EXTENDED_RELEASE_TABLET | Freq: Once | ORAL | Status: AC
  Administered 2018-03-16: 40 meq via ORAL
  Filled 2018-03-16: qty 2

## 2018-03-16 NOTE — Progress Notes (Signed)
PROGRESS NOTE    Tamara [name]  CNO:[id number] DOB: 1961-05-07 DOA: 03/14/2018 PCP: Jilda Panda, MD   Brief Narrative: Patient is a 57 year old female with past medical history of hypertension, hyperlipidemia, depression, anxiety, PTSD, GERD, diverticulitis who presents to the emergency department complaints of acute onset of severe lower abdominal pain.  Patient was found to have diverticulitis with a small contained perforation as per the CT imaging.  General surgery consulted.  Currently she is n.p.o.  On antibiotics.  Assessment & Plan:   Principal Problem:   Sepsis ([place]) Active Problems:   Anxiety   Diverticulitis of colon with perforation   CKD (chronic kidney disease), stage III ([place])  Diverticulitis with perforation: CT scan showed wall thickening of the distal sigmoid colon concerning for diverticulitis with a small contained perforation.  Presented with abdominal pain, tachycardia and mild leukocytosis.  She feels much better today.  General surgery following.  Plan for conservative management.  Currently she is n.p.o.  Continue antibiotics.  Patient has mild grade fever this morning.  Continue IV fluids.  Pain management.  Chronic kidney disease stage III: Currently kidney function is on baseline.  Continue IV fluids.  History of anxiety/depression/PTSD: On bunch of home medications which are on hold due to current n.p.o. Status.  GERD: Currently on Pepcid  Hypokalemia: Supplemented with potassium.   DVT prophylaxis: SCD Code Status: Full Family Communication: None present at the bedside Disposition Plan: Home after resolution of diverticulitis, perforation, surgical clearance   Consultants: General surgery  Procedures: None  Antimicrobials: Ceftriaxone and Flagyl  Subjective: Patient seen and examined at bedside this morning.  Remains hemodynamically stable.  Had mild grade fever this morning.  Abdominal pain has improved this morning.  She had couple of  loose bowel movements.  Objective: Vitals:   03/15/18 2047 03/16/18 0043 03/16/18 0057 03/16/18 0444  BP: 94/62 116/75  (!) 113/53  Pulse: (!) 118 (!) 102  98  Resp: 18   16  Temp: (!) 100.5 F (38.1 C)  98.3 F (36.8 C) 99.4 F (37.4 C)  TempSrc: Oral  Oral Oral  SpO2: 97%   97%  Weight:      Height:        Intake/Output Summary (Last 24 hours) at 03/16/2018 1129 Last data filed at 03/16/2018 0804 Gross per 24 hour  Intake 3125.92 ml  Output -  Net 3125.92 ml   Filed Weights   03/14/18 2138 03/15/18 0104  Weight: 83.9 kg 88 kg    Examination:  General exam: Appears calm and comfortable ,Not in distress,obese HEENT:PERRL,Oral mucosa moist, Ear/Nose normal on gross exam Respiratory system: Bilateral equal air entry, normal vesicular breath sounds, no wheezes or crackles  Cardiovascular system: S1 & S2 heard, RRR. No JVD, murmurs, rubs, gallops or clicks. No pedal edema. Gastrointestinal system: Abdomen is nondistended, soft .  Tenderness on the left lower quadrant. No organomegaly or masses felt.  Bowel sounds are present  Central nervous system: Alert and oriented. No focal neurological deficits. Extremities: No edema, no clubbing ,no cyanosis, distal peripheral pulses palpable. Skin: No rashes, lesions or ulcers,no icterus ,no pallor MSK: Normal muscle bulk,tone ,power Psychiatry: Judgement and insight appear normal. Mood & affect appropriate.     Data Reviewed: I have personally reviewed following labs and imaging studies  CBC: Recent Labs  Lab 03/14/18 2147 03/15/18 0441 03/16/18 0433  WBC 11.7* 11.8* 10.3  NEUTROABS  --   --  7.6  HGB 13.2 11.4* 11.5*  HCT 38.9 33.7*  35.2*  MCV 86.3 86.9 89.1  PLT 252 203 742   Basic Metabolic Panel: Recent Labs  Lab 03/14/18 2147 03/15/18 0441 03/16/18 0433  NA 138 141 141  K 3.5 3.7 3.2*  CL 103 109 110  CO2 22 23 22   GLUCOSE 126* 119* 84  BUN 23* 18 9  CREATININE 1.21* 1.12* 0.98  CALCIUM 9.8 8.7* 8.8*    GFR: Estimated Creatinine Clearance: 66.1 mL/min (by C-G formula based on SCr of 0.98 mg/dL). Liver Function Tests: Recent Labs  Lab 03/14/18 2147  AST 28  ALT 22  ALKPHOS 78  BILITOT 1.1  PROT 7.5  ALBUMIN 4.2   Recent Labs  Lab 03/14/18 2147  LIPASE 55*   No results for input(s): AMMONIA in the last 168 hours. Coagulation Profile: Recent Labs  Lab 03/14/18 2147  INR 1.02   Cardiac Enzymes: No results for input(s): CKTOTAL, CKMB, CKMBINDEX, TROPONINI in the last 168 hours. BNP (last 3 results) No results for input(s): PROBNP in the last 8760 hours. HbA1C: No results for input(s): HGBA1C in the last 72 hours. CBG: No results for input(s): GLUCAP in the last 168 hours. Lipid Profile: No results for input(s): CHOL, HDL, LDLCALC, TRIG, CHOLHDL, LDLDIRECT in the last 72 hours. Thyroid Function Tests: No results for input(s): TSH, T4TOTAL, FREET4, T3FREE, THYROIDAB in the last 72 hours. Anemia Panel: No results for input(s): VITAMINB12, FOLATE, FERRITIN, TIBC, IRON, RETICCTPCT in the last 72 hours. Sepsis Labs: Recent Labs  Lab 03/14/18 2147 03/15/18 0136  PROCALCITON <0.10  --   LATICACIDVEN  --  1.7    Recent Results (from the past 240 hour(s))  Urine culture     Status: None   Collection Time: 03/14/18 11:50 PM  Result Value Ref Range Status   Specimen Description   Final    URINE, RANDOM Performed at [facility], [place] [address], [place], [place] [address]    Special Requests   Final    NONE Performed at [facility], [place] [address] [place], [place] [address]    Culture   Final    NO GROWTH Performed at [facility], [place] [address] [place], [place] [address]    Report Status 03/16/2018 FINAL  Final  Culture, blood (x 2)     Status: None (Preliminary result)   Collection Time: 03/15/18  1:36 AM  Result Value Ref Range Status   Specimen Description BLOOD RIGHT HAND  Final   Special Requests   Final     BOTTLES DRAWN AEROBIC AND ANAEROBIC Blood Culture adequate volume Performed at [facility], [place] [address], [place], [place] [address]    Culture PENDING  Incomplete   Report Status PENDING  Incomplete  Culture, blood (x 2)     Status: None (Preliminary result)   Collection Time: 03/15/18  1:36 AM  Result Value Ref Range Status   Specimen Description BLOOD LEFT HAND  Final   Special Requests   Final    BOTTLES DRAWN AEROBIC AND ANAEROBIC Blood Culture adequate volume Performed at [facility], [address] [place], [place] [address]    Culture PENDING  Incomplete   Report Status PENDING  Incomplete         Radiology Studies: Ct Abdomen Pelvis Wo Contrast  Result Date: 03/14/2018 CLINICAL DATA:  Abdominal pain EXAM: CT ABDOMEN AND PELVIS WITHOUT CONTRAST TECHNIQUE: Multidetector CT imaging of the abdomen and pelvis was performed following the standard protocol without IV  contrast. COMPARISON:  CT 11/12/2016 FINDINGS: Lower chest: Lung bases are clear.  Small to moderate hiatal hernia. Hepatobiliary: Steatosis. Status post cholecystectomy. No biliary dilatation Pancreas: Unremarkable. No pancreatic ductal dilatation or surrounding inflammatory changes. Spleen: Normal in size without focal abnormality. Adrenals/Urinary Tract: Adrenal glands are unremarkable. Kidneys are normal, without renal calculi, focal lesion, or hydronephrosis. Bladder is unremarkable. Stomach/Bowel: Stomach is nonenlarged. No dilated small bowel. Negative appendix. Mild wall thickening of the distal sigmoid colon with surrounding inflammatory change and fluid. Small foci of extraluminal gas posteriorly. Vascular/Lymphatic: Nonaneurysmal aorta. Mild aortic atherosclerosis. No significant adenopathy Reproductive: Status post hysterectomy. No adnexal masses. Other: Trace fluid in the pelvis. Musculoskeletal: No acute or significant osseous findings. IMPRESSION: 1. Mild wall thickening with surrounding edema  and fluid at the distal sigmoid colon, suspected to represent acute diverticulitis. There are small foci of extraluminal gas in the region, concerning for contained perforation. There is no organized abscess. 2. Hepatic steatosis 3. Moderate hiatal hernia Electronically Signed   By: Donavan Foil M.D.   On: 03/14/2018 22:47        Scheduled Meds: . ALPRAZolam  2 mg Oral QHS  . enoxaparin (LOVENOX) injection  40 mg Subcutaneous Q24H  . losartan  100 mg Oral Daily  . ondansetron (ZOFRAN) IV  4 mg Intravenous Once  . Oxcarbazepine  300 mg Oral Daily  . potassium chloride  40 mEq Oral Once  . sodium chloride flush  3 mL Intravenous Q12H  . temazepam  45 mg Oral QHS  . Vilazodone HCl  30 mg Oral Daily   Continuous Infusions: . cefTRIAXone (ROCEPHIN)  IV 2 g (03/15/18 2240)   And  . metronidazole 500 mg (03/16/18 1044)  . dextrose 5 % and 0.9% NaCl       LOS: 2 days    Time spent: 25 mins.More than 50% of that time was spent in counseling and/or coordination of care.      Shelly Coss, MD [facility] Pager [phone]  If 7PM-7AM, please contact night-coverage [email or web address] Password [facility] 03/16/2018, 11:29 AM

## 2018-03-16 NOTE — Progress Notes (Signed)
[place] [facility]:  [phone] General Surgery Progress Note   LOS: 2 days  POD -     Chief Complaint: Abdominal pain  Assessment and Plan: 1.  Diverticulitis with focal perforation            WBC - 10,300 - 03/16/2018  Rocephin/Flagyl - 9/13 >>>  Clinically a little better.  May have some broth, otherwise NPO.  2.  Hypokalemia  K+ - 3.2 - 03/16/2018  IV K+ ordered, but she could be given PO K+  IV is infiltrated right now. 3.  Depression/Anxiety 4.  GERD with moderate sized HH 5.  HTN 6.  PTSD 7.  Obesity 8.  DVT prophylaxis - no chemoprophylaxis, will start Lovenox   Principal Problem:   Sepsis ([place]) Active Problems:   Anxiety   Diverticulitis of colon with perforation   CKD (chronic kidney disease), stage III ([facility])   Subjective:  Had a couple of BM's.  Still lower abdominal pain - which was better yesterday afternoon, but then got worse last PM.  IV has infiltrated.  Objective:   Vitals:   03/16/18 0057 03/16/18 0444  BP:  (!) 113/53  Pulse:  98  Resp:  16  Temp: 98.3 F (36.8 C) 99.4 F (37.4 C)  SpO2:  97%     Intake/Output from previous day:  09/14 0701 - 09/15 0700 In: 3576.2 [P.O.:120; I.V.:3219.2; IV Piggyback:237] Out: 1000 [Urine:1000]  Intake/Output this shift:  No intake/output data recorded.   Physical Exam:   General: obese WF who is alert and oriented.    HEENT: Normal. Pupils equal. .   Lungs: Clear   Abdomen: Has some BS.  Moderate tenderness of lower abdomen.   Lab Results:    Recent Labs    03/15/18 0441 03/16/18 0433  WBC 11.8* 10.3  HGB 11.4* 11.5*  HCT 33.7* 35.2*  PLT 203 214    BMET   Recent Labs    03/15/18 0441 03/16/18 0433  NA 141 141  K 3.7 3.2*  CL 109 110  CO2 23 22  GLUCOSE 119* 84  BUN 18 9  CREATININE 1.12* 0.98  CALCIUM 8.7* 8.8*    PT/INR   Recent Labs    03/14/18 2147  LABPROT 13.3  INR 1.02    ABG  No results for input(s): PHART, HCO3 in the last 72  hours.  Invalid input(s): PCO2, PO2   Studies/Results:  Ct Abdomen Pelvis Wo Contrast  Result Date: 03/14/2018 CLINICAL DATA:  Abdominal pain EXAM: CT ABDOMEN AND PELVIS WITHOUT CONTRAST TECHNIQUE: Multidetector CT imaging of the abdomen and pelvis was performed following the standard protocol without IV contrast. COMPARISON:  CT 11/12/2016 FINDINGS: Lower chest: Lung bases are clear.  Small to moderate hiatal hernia. Hepatobiliary: Steatosis. Status post cholecystectomy. No biliary dilatation Pancreas: Unremarkable. No pancreatic ductal dilatation or surrounding inflammatory changes. Spleen: Normal in size without focal abnormality. Adrenals/Urinary Tract: Adrenal glands are unremarkable. Kidneys are normal, without renal calculi, focal lesion, or hydronephrosis. Bladder is unremarkable. Stomach/Bowel: Stomach is nonenlarged. No dilated small bowel. Negative appendix. Mild wall thickening of the distal sigmoid colon with surrounding inflammatory change and fluid. Small foci of extraluminal gas posteriorly. Vascular/Lymphatic: Nonaneurysmal aorta. Mild aortic atherosclerosis. No significant adenopathy Reproductive: Status post hysterectomy. No adnexal masses. Other: Trace fluid in the pelvis. Musculoskeletal: No acute or significant osseous findings. IMPRESSION: 1. Mild wall thickening with surrounding edema and fluid at the distal sigmoid colon, suspected to represent acute diverticulitis. There are small foci of  extraluminal gas in the region, concerning for contained perforation. There is no organized abscess. 2. Hepatic steatosis 3. Moderate hiatal hernia Electronically Signed   By: Donavan Foil M.D.   On: 03/14/2018 22:47     Anti-infectives:   Anti-infectives (From admission, onward)   Start     Dose/Rate Route Frequency Ordered Stop   03/15/18 2200  cefTRIAXone (ROCEPHIN) 2 g in sodium chloride 0.9 % 100 mL IVPB     2 g 200 mL/hr over 30 Minutes Intravenous Every 24 hours 03/15/18 0713      03/15/18 0800  metroNIDAZOLE (FLAGYL) IVPB 500 mg     500 mg 100 mL/hr over 60 Minutes Intravenous Every 8 hours 03/15/18 0713     03/15/18 0000  ciprofloxacin (CIPRO) IVPB 400 mg  Status:  Discontinued     400 mg 200 mL/hr over 60 Minutes Intravenous 2 times daily 03/14/18 2338 03/15/18 0707   03/14/18 2315  cefTRIAXone (ROCEPHIN) 2 g in sodium chloride 0.9 % 100 mL IVPB     2 g 200 mL/hr over 30 Minutes Intravenous  Once 03/14/18 2308 03/14/18 2337   03/14/18 2315  metroNIDAZOLE (FLAGYL) IVPB 500 mg     500 mg 100 mL/hr over 60 Minutes Intravenous  Once 03/14/18 2308 03/15/18 0038      Alphonsa Overall, MD, FACS Pager: [facility]: [phone] 03/16/2018

## 2018-03-16 NOTE — Progress Notes (Signed)
Patient c/o that the PIV was hurting.  The RN tried to removed excess tape around the PIV and redress the IV but the patient stated  That the PIV still hurts. Will remove the PIV and assess for an IV restart

## 2018-03-17 LAB — C DIFFICILE QUICK SCREEN W PCR REFLEX
C DIFFICILE (CDIFF) INTERP: NOT DETECTED
C DIFFICLE (CDIFF) ANTIGEN: NEGATIVE
C Diff toxin: NEGATIVE

## 2018-03-17 LAB — BASIC METABOLIC PANEL
ANION GAP: 9 (ref 5–15)
BUN: 7 mg/dL (ref 6–20)
CALCIUM: 8.7 mg/dL — AB (ref 8.9–10.3)
CHLORIDE: 107 mmol/L (ref 98–111)
CO2: 22 mmol/L (ref 22–32)
Creatinine, Ser: 0.85 mg/dL (ref 0.44–1.00)
GFR calc Af Amer: >60 mL/min (ref >60)
GFR calc non Af Amer: >60 mL/min (ref >60)
GLUCOSE: 117 mg/dL — AB (ref 70–99)
POTASSIUM: 3.4 mmol/L — AB (ref 3.5–5.1)
Sodium: 138 mmol/L (ref 135–145)

## 2018-03-17 MED ORDER — SODIUM CHLORIDE 0.9 % IV SOLN
2.0000 g | Freq: Three times a day (TID) | INTRAVENOUS | Status: DC
  Administered 2018-03-17 – 2018-03-21 (×11): 2 g via INTRAVENOUS
  Filled 2018-03-17 (×12): qty 2

## 2018-03-17 MED ORDER — POTASSIUM CHLORIDE CRYS ER 20 MEQ PO TBCR
40.0000 meq | EXTENDED_RELEASE_TABLET | Freq: Once | ORAL | Status: AC
  Administered 2018-03-17: 40 meq via ORAL
  Filled 2018-03-17: qty 2

## 2018-03-17 NOTE — Progress Notes (Addendum)
PROGRESS NOTE    Tamara [name]  YSA:[id number] DOB: 1960-10-31 DOA: 03/14/2018 PCP: Jilda Panda, MD   Brief Narrative: Patient is a 57 year old female with past medical history of hypertension, hyperlipidemia, depression, anxiety, PTSD, GERD, diverticulitis who presents to the emergency department complaints of acute onset of severe lower abdominal pain.  Patient was found to have diverticulitis with a small contained perforation as per the CT imaging.  General surgery consulted.  Currently she is n.p.o.  On antibiotics.  Assessment & Plan:   Principal Problem:   Sepsis ([place]) Active Problems:   Anxiety   Diverticulitis of colon with perforation   CKD (chronic kidney disease), stage III ([place])  Diverticulitis with perforation: CT scan showed wall thickening of the distal sigmoid colon concerning for diverticulitis with a small contained perforation.  Presented with abdominal pain, tachycardia and mild leukocytosis.  She feels much better today.  General surgery following.  Plan for conservative management.  Currently she is n.p.o.  Continue antibiotics.  Patient has mild grade fever this morning.  Continue IV fluids.  Pain management. It was noted that patient is allergic to ceftriaxone but she has been tolerating very well.It was started on admission by the admitter.Will request pharmacy to review this med on her allergy list and potentially take it out.  Diarrhea: Has been having frequent episodes of diarrhea today.  Most likely this is associated with her diverticulitis but will check C. difficile and GI pathogen panel.  Continue IV fluids.  Chronic kidney disease stage III: Currently kidney function is on baseline.  Continue IV fluids.  History of anxiety/PTSD: On xanax at home.  GERD: On dexlansoprazole at home. Denied pantoprazole here.  Hypokalemia: Supplemented with potassium.   DVT prophylaxis: SCD Code Status: Full Family Communication: None present at the  bedside Disposition Plan: Home after resolution of diverticulitis and diarrhoea, surgical clearance   Consultants: General surgery  Procedures: None  Antimicrobials: Ceftriaxone and Flagyl  Subjective: Patient seen and examined at bedside this morning.  Remains hemodynamically stable.  Had mild grade fever this morning.  Abdominal pain has slightly improved this morning.  She had couple of loose bowel movements.  Objective: Vitals:   03/16/18 0444 03/16/18 1414 03/16/18 2224 03/17/18 0559  BP: (!) 113/53 129/78 130/83 135/71  Pulse: 98 (!) 105 90 93  Resp: 16 16 18 16   Temp: 99.4 F (37.4 C) 99.9 F (37.7 C) 99.8 F (37.7 C) 99.7 F (37.6 C)  TempSrc: Oral Oral Oral Oral  SpO2: 97% 100% 99% 96%  Weight:      Height:        Intake/Output Summary (Last 24 hours) at 03/17/2018 1215 Last data filed at 03/17/2018 0224 Gross per 24 hour  Intake 1010.43 ml  Output -  Net 1010.43 ml   Filed Weights   03/14/18 2138 03/15/18 0104  Weight: 83.9 kg 88 kg    Examination:  General exam: Not in distress,obese,weak HEENT:PERRL,Oral mucosa moist, Ear/Nose normal on gross exam Respiratory system: Bilateral equal air entry, normal vesicular breath sounds, no wheezes or crackles  Cardiovascular system: S1 & S2 heard, RRR. No JVD, murmurs, rubs, gallops or clicks. Gastrointestinal system: Abdomen is nondistended, soft .Genralised tenderness but more on the left lower quadrant. No organomegaly or masses felt. Normal bowel sounds heard. Central nervous system: Alert and oriented. No focal neurological deficits. Extremities: No edema, no clubbing ,no cyanosis, distal peripheral pulses palpable. Skin: No rashes, lesions or ulcers,no icterus ,no pallor Psychiatry: Judgement and insight appear normal.  Mood & affect appropriate.   Data Reviewed: I have personally reviewed following labs and imaging studies  CBC: Recent Labs  Lab 03/14/18 2147 03/15/18 0441 03/16/18 0433  WBC 11.7*  11.8* 10.3  NEUTROABS  --   --  7.6  HGB 13.2 11.4* 11.5*  HCT 38.9 33.7* 35.2*  MCV 86.3 86.9 89.1  PLT 252 203 829   Basic Metabolic Panel: Recent Labs  Lab 03/14/18 2147 03/15/18 0441 03/16/18 0433 03/17/18 0417  NA 138 141 141 138  K 3.5 3.7 3.2* 3.4*  CL 103 109 110 107  CO2 22 23 22 22   GLUCOSE 126* 119* 84 117*  BUN 23* 18 9 7   CREATININE 1.21* 1.12* 0.98 0.85  CALCIUM 9.8 8.7* 8.8* 8.7*   GFR: Estimated Creatinine Clearance: 76.2 mL/min (by C-G formula based on SCr of 0.85 mg/dL). Liver Function Tests: Recent Labs  Lab 03/14/18 2147  AST 28  ALT 22  ALKPHOS 78  BILITOT 1.1  PROT 7.5  ALBUMIN 4.2   Recent Labs  Lab 03/14/18 2147  LIPASE 55*   No results for input(s): AMMONIA in the last 168 hours. Coagulation Profile: Recent Labs  Lab 03/14/18 2147  INR 1.02   Cardiac Enzymes: No results for input(s): CKTOTAL, CKMB, CKMBINDEX, TROPONINI in the last 168 hours. BNP (last 3 results) No results for input(s): PROBNP in the last 8760 hours. HbA1C: No results for input(s): HGBA1C in the last 72 hours. CBG: No results for input(s): GLUCAP in the last 168 hours. Lipid Profile: No results for input(s): CHOL, HDL, LDLCALC, TRIG, CHOLHDL, LDLDIRECT in the last 72 hours. Thyroid Function Tests: No results for input(s): TSH, T4TOTAL, FREET4, T3FREE, THYROIDAB in the last 72 hours. Anemia Panel: No results for input(s): VITAMINB12, FOLATE, FERRITIN, TIBC, IRON, RETICCTPCT in the last 72 hours. Sepsis Labs: Recent Labs  Lab 03/14/18 2147 03/15/18 0136  PROCALCITON <0.10  --   LATICACIDVEN  --  1.7    Recent Results (from the past 240 hour(s))  Urine culture     Status: None   Collection Time: 03/14/18 11:50 PM  Result Value Ref Range Status   Specimen Description   Final    URINE, RANDOM Performed at [facility], [place] [address] [place], [place] [address]    Special Requests   Final    NONE Performed at [facility], [place] [address], [place], [place] [address]    Culture   Final    NO GROWTH Performed at [facility], [place] [address], [place], [place] [address]    Report Status 03/16/2018 FINAL  Final  Culture, blood (x 2)     Status: None (Preliminary result)   Collection Time: 03/15/18  1:36 AM  Result Value Ref Range Status   Specimen Description BLOOD RIGHT HAND  Final   Special Requests   Final    BOTTLES DRAWN AEROBIC AND ANAEROBIC Blood Culture adequate volume   Culture   Final    NO GROWTH 2 DAYS Performed at [facility], [place] [address] [place], [place] [address]    Report Status PENDING  Incomplete  Culture, blood (x 2)     Status: None (Preliminary result)   Collection Time: 03/15/18  1:36 AM  Result Value Ref Range Status   Specimen Description BLOOD LEFT HAND  Final   Special Requests   Final    BOTTLES DRAWN AEROBIC AND ANAEROBIC Blood Culture adequate volume   Culture   Final  NO GROWTH 2 DAYS Performed at [facility], [place] [address] [place], [place] [address]    Report Status PENDING  Incomplete         Radiology Studies: No results found.      Scheduled Meds: . ALPRAZolam  2 mg Oral QHS  . enoxaparin (LOVENOX) injection  40 mg Subcutaneous Q24H  . losartan  100 mg Oral Daily  . ondansetron (ZOFRAN) IV  4 mg Intravenous Once  . Oxcarbazepine  300 mg Oral Daily  . sodium chloride flush  3 mL Intravenous Q12H  . temazepam  45 mg Oral QHS  . Vilazodone HCl  30 mg Oral Daily   Continuous Infusions: . cefTRIAXone (ROCEPHIN)  IV 2 g (03/15/18 2240)   And  . metronidazole 500 mg (03/17/18 0845)  . dextrose 5 % and 0.9% NaCl 75 mL/hr at 03/17/18 0224     LOS: 3 days    Time spent: 25 mins.More than 50% of that time was spent in counseling and/or coordination of care.      Shelly Coss, MD [facility] Pager [phone]  If 7PM-7AM, please contact night-coverage [email or web address] Password [facility] 03/17/2018, 12:15 PM

## 2018-03-17 NOTE — Progress Notes (Signed)
   03/17/18 1420  Clinical Encounter Type  Visited With Patient  Visit Type Initial;Other (Comment) (Advanced Directive)  Referral From Chaplain (Consult List)  Consult/Referral To [place] responded to request for spiritual care consult. Chaplain was welcomed into Pt. room by the Pt. Pt. expressed desire to update existing AD with new HCPOA.  Pt. chose not to complete AD today because of needing rest. Pt. communicated to chaplain need to complete AD in next 2-3 days.  Chaplain and Pt. prayed together before chaplain exited.

## 2018-03-17 NOTE — Progress Notes (Signed)
Pharmacy Brief Note:   Pharmacy consult entered for "Noted to have ceftraixone on her allergy list.But patient is tolerating. Please consider removing it from the list after verification with patient".  Today, 03/17/18   Patient currently has the following antibiotic orders:  Ceftriaxone 2 g IV q24h 9/13 >>  Metronidazole 500 mg IV q8h 9/13 >>  Patient received ceftriaxone dose on 9/13 and 9/14, but MAR shows that she refused her doses on 9/15 and 9/16. Per discussion with RN, patient reports that she has had an allergic reaction to this medication in the past and does not want to take it.   Discussed with MD - antibiotics being changed from CTX + metronidazole to aztreonam + metronidazole.   Pharmacy signing off.  Lenis Noon, PharmD Clinical Pharmacist 03/17/18 10:00 PM

## 2018-03-17 NOTE — Progress Notes (Signed)
Patient ID: Tamara [name], female   DOB: [birth year], 57 y.o.   MRN: [id number]       Subjective: Pt complaining of horrible diarrhea.  It is watery in nature.  She has been having numerous episodes every since yesterday she says.  Abdominal pain still present.  No blood in her stool.  Objective: Vital signs in last 24 hours: Temp:  [99.7 F (37.6 C)-99.9 F (37.7 C)] 99.7 F (37.6 C) (09/16 0559) Pulse Rate:  [90-105] 93 (09/16 0559) Resp:  [16-18] 16 (09/16 0559) BP: (129-135)/(71-83) 135/71 (09/16 0559) SpO2:  [96 %-100 %] 96 % (09/16 0559) Last BM Date: 03/14/18  Intake/Output from previous day: 09/15 0701 - 09/16 0700 In: 1828.3 [P.O.:200; I.V.:1242.2; IV Piggyback:386.1] Out: -  Intake/Output this shift: No intake/output data recorded.  PE: Heart: regular Lungs: CTAB Abd: soft, still tender in BLQ. +BS, obese, but ND  Lab Results:  Recent Labs    03/15/18 0441 03/16/18 0433  WBC 11.8* 10.3  HGB 11.4* 11.5*  HCT 33.7* 35.2*  PLT 203 214   BMET Recent Labs    03/16/18 0433 03/17/18 0417  NA 141 138  K 3.2* 3.4*  CL 110 107  CO2 22 22  GLUCOSE 84 117*  BUN 9 7  CREATININE 0.98 0.85  CALCIUM 8.8* 8.7*   PT/INR Recent Labs    03/14/18 2147  LABPROT 13.3  INR 1.02   CMP     Component Value Date/Time   NA 138 03/17/2018 0417   K 3.4 (L) 03/17/2018 0417   CL 107 03/17/2018 0417   CO2 22 03/17/2018 0417   GLUCOSE 117 (H) 03/17/2018 0417   BUN 7 03/17/2018 0417   CREATININE 0.85 03/17/2018 0417   CREATININE 1.12 (H) 10/05/2015 1540   CALCIUM 8.7 (L) 03/17/2018 0417   CALCIUM 11.8 (H) 03/13/2010 2236   PROT 7.5 03/14/2018 2147   ALBUMIN 4.2 03/14/2018 2147   AST 28 03/14/2018 2147   ALT 22 03/14/2018 2147   ALKPHOS 78 03/14/2018 2147   BILITOT 1.1 03/14/2018 2147   GFRNONAA >60 03/17/2018 0417   GFRNONAA 56 (L) 10/05/2015 1540   GFRAA >60 03/17/2018 0417   GFRAA 65 10/05/2015 1540   Lipase     Component Value Date/Time   LIPASE 55  (H) 03/14/2018 2147       Studies/Results: No results found.  Anti-infectives: Anti-infectives (From admission, onward)   Start     Dose/Rate Route Frequency Ordered Stop   03/15/18 2200  cefTRIAXone (ROCEPHIN) 2 g in sodium chloride 0.9 % 100 mL IVPB     2 g 200 mL/hr over 30 Minutes Intravenous Every 24 hours 03/15/18 0713     03/15/18 0800  metroNIDAZOLE (FLAGYL) IVPB 500 mg     500 mg 100 mL/hr over 60 Minutes Intravenous Every 8 hours 03/15/18 0713     03/15/18 0000  ciprofloxacin (CIPRO) IVPB 400 mg  Status:  Discontinued     400 mg 200 mL/hr over 60 Minutes Intravenous 2 times daily 03/14/18 2338 03/15/18 0707   03/14/18 2315  cefTRIAXone (ROCEPHIN) 2 g in sodium chloride 0.9 % 100 mL IVPB     2 g 200 mL/hr over 30 Minutes Intravenous  Once 03/14/18 2308 03/14/18 2337   03/14/18 2315  metroNIDAZOLE (FLAGYL) IVPB 500 mg     500 mg 100 mL/hr over 60 Minutes Intravenous  Once 03/14/18 2308 03/15/18 0038       Assessment/Plan Diverticulitis with focal perforation -check c diff.  Suspect diarrhea is just secondary to her diverticulitis, but would like to rule this out given copious amounts. -patient states she is allergic to rocephin and shouldn't be getting it; however, it appears she has been getting it for 48 hrs and seems to be tolerating it well. -cont NPO x sips and ice due to persistent pain and diarrhea. Hypokalemia -replaced by medicine Depression GERD HTN PTSD Obesity  FEN - Npox ice/sips/IVFs VTE - Lovenox/SCDs ID - Rocephin/Flagyl 9/14 -->   LOS: 3 days    Henreitta Cea , [facility] 03/17/2018, 10:38 AM Pager: [phone]

## 2018-03-17 NOTE — Care Management Important Message (Signed)
Important Message  Patient Details  Name: Tamara [name] MRN: [id number] Date of Birth: 06-Feb-1961   Medicare Important Message Given:  Yes    Kerin Salen 03/17/2018, 11:29 AMImportant Message  Patient Details  Name: Tamara [name] MRN: [id number] Date of Birth: [birth year]   Medicare Important Message Given:  Yes    Kerin Salen 03/17/2018, 11:28 AM

## 2018-03-18 LAB — BASIC METABOLIC PANEL
Anion gap: 8 (ref 5–15)
BUN: 7 mg/dL (ref 6–20)
CO2: 24 mmol/L (ref 22–32)
Calcium: 9.1 mg/dL (ref 8.9–10.3)
Chloride: 109 mmol/L (ref 98–111)
Creatinine, Ser: 0.87 mg/dL (ref 0.44–1.00)
GFR calc Af Amer: >60 mL/min (ref >60)
Glucose, Bld: 111 mg/dL — ABNORMAL HIGH (ref 70–99)
POTASSIUM: 3.5 mmol/L (ref 3.5–5.1)
Sodium: 141 mmol/L (ref 135–145)

## 2018-03-18 LAB — GASTROINTESTINAL PANEL BY PCR, STOOL (REPLACES STOOL CULTURE)

## 2018-03-18 LAB — CBC
HCT: 33.8 % — ABNORMAL LOW (ref 36.0–46.0)
Hemoglobin: 11.6 g/dL — ABNORMAL LOW (ref 12.0–15.0)
MCH: 28.9 pg (ref 26.0–34.0)
MCHC: 34.3 g/dL (ref 30.0–36.0)
MCV: 84.3 fL (ref 78.0–100.0)
PLATELETS: 189 10*3/uL (ref 150–400)
RBC: 4.01 MIL/uL (ref 3.87–5.11)
RDW: 12.9 % (ref 11.5–15.5)
WBC: 5 10*3/uL (ref 4.0–10.5)

## 2018-03-18 MED ORDER — DEXLANSOPRAZOLE 60 MG PO CPDR
60.0000 mg | DELAYED_RELEASE_CAPSULE | Freq: Every day | ORAL | Status: DC
  Administered 2018-03-18 – 2018-03-20 (×3): 60 mg via ORAL
  Filled 2018-03-18 (×7): qty 1

## 2018-03-18 NOTE — Progress Notes (Signed)
PROGRESS NOTE    Tamara [name]  BLT:[id number] DOB: 20-Aug-1960 DOA: 03/14/2018 PCP: Jilda Panda, MD  Brief Narrative:  57 year old female with past medical history of hypertension, hyperlipidemia, depression, anxiety, PTSD, GERD, diverticulitis who presents to the emergency department complaints of acute onset of severe lower abdominal pain.  Patient was found to have diverticulitis with a small contained perforation as per the CT imaging.  General surgery consulted.  Currently she is ON CLEAR LIQUIDS  On antibiotics Assessment & Plan:   Principal Problem:   Sepsis ([place]) Active Problems:   Anxiety   Diverticulitis of colon with perforation   CKD (chronic kidney disease), stage III ([place])  Diverticulitis with perforation: CT scan showed wall thickening of the distal sigmoid colon concerning for diverticulitis with a small contained perforation.  Presented with abdominal pain, tachycardia and mild leukocytosis.  She feels much better today.  General surgery following.  Plan for conservative management.  Diet has been advanced to clear liquids by surgery today.  She wanted me to stop her Dilaudid since she is allergic to Dilaudid this has been stopped.  Her diarrhea is better and decreasing.  She wants to take a probiotic that she takes at home which I said is okay.  Patient reported allergies to ceftriaxone and so ceftriaxone was stopped and was started on aztreonam.  Patient's daughter is a Software engineer at [employer].   Diarrhea: Better C. difficile negative. Continue IV fluids.  Chronic kidney disease stage III: Currently kidney function is on baseline.  Continue IV fluids.  History of anxiety/PTSD: On xanax at home.  GERD:  She reports she was taking Dexilant at home but if that is not available she is okay to take Nexium 60 mg daily.  Hypokalemia: Supplemented with potassium.   DVT prophylaxis: SCD Code Status: Full code Family Communication: No family  available Disposition Plan: Await surgical clearance for discharge home patient is just starting to take clear liquids 03/18/2018. Consultants:  General surgery Procedures: None Antimicrobials aztreonam and Flagyl Subjective: Patient resting in bed reports her diarrhea is better abdominal pain is better no nausea vomiting.  Objective: Vitals:   03/17/18 0559 03/17/18 1328 03/17/18 2101 03/18/18 0443  BP: 135/71 132/86 (!) 142/97 122/88  Pulse: 93 84 81 77  Resp: 16 16 17 16   Temp: 99.7 F (37.6 C) 98.7 F (37.1 C) 99.1 F (37.3 C) 98.4 F (36.9 C)  TempSrc: Oral Oral Oral Oral  SpO2: 96% 99% 99% 99%  Weight:      Height:        Intake/Output Summary (Last 24 hours) at 03/18/2018 1115 Last data filed at 03/18/2018 0600 Gross per 24 hour  Intake 2659.53 ml  Output -  Net 2659.53 ml   Filed Weights   03/14/18 2138 03/15/18 0104  Weight: 83.9 kg 88 kg    Examination:  General exam: Appears calm and comfortable  Respiratory system: Clear to auscultation. Respiratory effort normal. Cardiovascular system: S1 & S2 heard, RRR. No JVD, murmurs, rubs, gallops or clicks. No pedal edema. Gastrointestinal system: Abdomen is nondistended, soft and tender in the lower abdomen no organomegaly or masses felt. Normal bowel sounds heard. Central nervous system: Alert and oriented. No focal neurological deficits. Extremities: Symmetric 5 x 5 power. Skin: No rashes, lesions or ulcers Psychiatry: Judgement and insight appear normal. Mood & affect appropriate.     Data Reviewed: I have personally reviewed following labs and imaging studies  CBC: Recent Labs  Lab 03/14/18 2147 03/15/18 0441 03/16/18  4235 03/18/18 0418  WBC 11.7* 11.8* 10.3 5.0  NEUTROABS  --   --  7.6  --   HGB 13.2 11.4* 11.5* 11.6*  HCT 38.9 33.7* 35.2* 33.8*  MCV 86.3 86.9 89.1 84.3  PLT 252 203 214 361   Basic Metabolic Panel: Recent Labs  Lab 03/14/18 2147 03/15/18 0441 03/16/18 0433 03/17/18 0417  03/18/18 0418  NA 138 141 141 138 141  K 3.5 3.7 3.2* 3.4* 3.5  CL 103 109 110 107 109  CO2 22 23 22 22 24   GLUCOSE 126* 119* 84 117* 111*  BUN 23* 18 9 7 7   CREATININE 1.21* 1.12* 0.98 0.85 0.87  CALCIUM 9.8 8.7* 8.8* 8.7* 9.1   GFR: Estimated Creatinine Clearance: 74.4 mL/min (by C-G formula based on SCr of 0.87 mg/dL). Liver Function Tests: Recent Labs  Lab 03/14/18 2147  AST 28  ALT 22  ALKPHOS 78  BILITOT 1.1  PROT 7.5  ALBUMIN 4.2   Recent Labs  Lab 03/14/18 2147  LIPASE 55*   No results for input(s): AMMONIA in the last 168 hours. Coagulation Profile: Recent Labs  Lab 03/14/18 2147  INR 1.02   Cardiac Enzymes: No results for input(s): CKTOTAL, CKMB, CKMBINDEX, TROPONINI in the last 168 hours. BNP (last 3 results) No results for input(s): PROBNP in the last 8760 hours. HbA1C: No results for input(s): HGBA1C in the last 72 hours. CBG: No results for input(s): GLUCAP in the last 168 hours. Lipid Profile: No results for input(s): CHOL, HDL, LDLCALC, TRIG, CHOLHDL, LDLDIRECT in the last 72 hours. Thyroid Function Tests: No results for input(s): TSH, T4TOTAL, FREET4, T3FREE, THYROIDAB in the last 72 hours. Anemia Panel: No results for input(s): VITAMINB12, FOLATE, FERRITIN, TIBC, IRON, RETICCTPCT in the last 72 hours. Sepsis Labs: Recent Labs  Lab 03/14/18 2147 03/15/18 0136  PROCALCITON <0.10  --   LATICACIDVEN  --  1.7    Recent Results (from the past 240 hour(s))  Urine culture     Status: None   Collection Time: 03/14/18 11:50 PM  Result Value Ref Range Status   Specimen Description   Final    URINE, RANDOM Performed at [facility], [place] [address] [place], [place] [address]    Special Requests   Final    NONE Performed at [facility], [place] [address] [place], [place] [address]    Culture   Final    NO GROWTH Performed at [facility], [place] [address], [place], [place] [address]    Report  Status 03/16/2018 FINAL  Final  Culture, blood (x 2)     Status: None (Preliminary result)   Collection Time: 03/15/18  1:36 AM  Result Value Ref Range Status   Specimen Description BLOOD RIGHT HAND  Final   Special Requests   Final    BOTTLES DRAWN AEROBIC AND ANAEROBIC Blood Culture adequate volume   Culture   Final    NO GROWTH 2 DAYS Performed at [facility], [place] [address], [place],  [address]    Report Status PENDING  Incomplete  Culture, blood (x 2)     Status: None (Preliminary result)   Collection Time: 03/15/18  1:36 AM  Result Value Ref Range Status   Specimen Description BLOOD LEFT HAND  Final   Special Requests   Final    BOTTLES DRAWN AEROBIC AND ANAEROBIC Blood Culture adequate volume   Culture   Final    NO GROWTH 2 DAYS Performed  at [facility], [place] [address] [place], [place] [address]    Report Status PENDING  Incomplete  C difficile quick scan w PCR reflex     Status: None   Collection Time: 03/17/18 10:37 AM  Result Value Ref Range Status   C Diff antigen NEGATIVE NEGATIVE Final   C Diff toxin NEGATIVE NEGATIVE Final   C Diff interpretation No C. difficile detected.  Final    Comment: Performed at [facility], [place] [address], [place], [place] [address]         Radiology Studies: No results found.      Scheduled Meds: . ALPRAZolam  2 mg Oral QHS  . dexlansoprazole  60 mg Oral Daily  . enoxaparin (LOVENOX) injection  40 mg Subcutaneous Q24H  . losartan  100 mg Oral Daily  . ondansetron (ZOFRAN) IV  4 mg Intravenous Once  . Oxcarbazepine  300 mg Oral Daily  . sodium chloride flush  3 mL Intravenous Q12H  . temazepam  45 mg Oral QHS  . Vilazodone HCl  30 mg Oral Daily   Continuous Infusions: . aztreonam 200 mL/hr at 03/18/18 0600  . dextrose 5 % and 0.9% NaCl 75 mL/hr at 03/18/18 0910  . metronidazole 500 mg (03/18/18 0859)     LOS: 4 days     Georgette Shell, MD [facility] If  7PM-7AM, please contact night-coverage [email or web address] Password [email or web address] 03/18/2018, 11:15 AM

## 2018-03-18 NOTE — Progress Notes (Signed)
Patient ID: Tamara [name], female   DOB: Jul 27, 1960, 57 y.o.   MRN: [id number]       Subjective: Feels better today.  Less pain.  Diarrhea becoming less.  Objective: Vital signs in last 24 hours: Temp:  [98.4 F (36.9 C)-99.1 F (37.3 C)] 98.4 F (36.9 C) (09/17 0443) Pulse Rate:  [77-84] 77 (09/17 0443) Resp:  [16-17] 16 (09/17 0443) BP: (122-142)/(86-97) 122/88 (09/17 0443) SpO2:  [99 %] 99 % (09/17 0443) Last BM Date: 03/17/18  Intake/Output from previous day: 09/16 0701 - 09/17 0700 In: 2659.5 [P.O.:50; I.V.:2042.9; IV Piggyback:566.7] Out: -  Intake/Output this shift: No intake/output data recorded.  PE: Abd: soft, less tender in lower abdomen, obese, but ND, +BS  Lab Results:  Recent Labs    03/16/18 0433 03/18/18 0418  WBC 10.3 5.0  HGB 11.5* 11.6*  HCT 35.2* 33.8*  PLT 214 189   BMET Recent Labs    03/17/18 0417 03/18/18 0418  NA 138 141  K 3.4* 3.5  CL 107 109  CO2 22 24  GLUCOSE 117* 111*  BUN 7 7  CREATININE 0.85 0.87  CALCIUM 8.7* 9.1   PT/INR No results for input(s): LABPROT, INR in the last 72 hours. CMP     Component Value Date/Time   NA 141 03/18/2018 0418   K 3.5 03/18/2018 0418   CL 109 03/18/2018 0418   CO2 24 03/18/2018 0418   GLUCOSE 111 (H) 03/18/2018 0418   BUN 7 03/18/2018 0418   CREATININE 0.87 03/18/2018 0418   CREATININE 1.12 (H) 10/05/2015 1540   CALCIUM 9.1 03/18/2018 0418   CALCIUM 11.8 (H) 03/13/2010 2236   PROT 7.5 03/14/2018 2147   ALBUMIN 4.2 03/14/2018 2147   AST 28 03/14/2018 2147   ALT 22 03/14/2018 2147   ALKPHOS 78 03/14/2018 2147   BILITOT 1.1 03/14/2018 2147   GFRNONAA >60 03/18/2018 0418   GFRNONAA 56 (L) 10/05/2015 1540   GFRAA >60 03/18/2018 0418   GFRAA 65 10/05/2015 1540   Lipase     Component Value Date/Time   LIPASE 55 (H) 03/14/2018 2147       Studies/Results: No results found.  Anti-infectives: Anti-infectives (From admission, onward)   Start     Dose/Rate Route Frequency  Ordered Stop   03/17/18 2230  aztreonam (AZACTAM) 2 g in sodium chloride 0.9 % 100 mL IVPB     2 g 200 mL/hr over 30 Minutes Intravenous Every 8 hours 03/17/18 2202     03/15/18 2200  cefTRIAXone (ROCEPHIN) 2 g in sodium chloride 0.9 % 100 mL IVPB  Status:  Discontinued     2 g 200 mL/hr over 30 Minutes Intravenous Every 24 hours 03/15/18 0713 03/17/18 2203   03/15/18 0800  metroNIDAZOLE (FLAGYL) IVPB 500 mg     500 mg 100 mL/hr over 60 Minutes Intravenous Every 8 hours 03/15/18 0713     03/15/18 0000  ciprofloxacin (CIPRO) IVPB 400 mg  Status:  Discontinued     400 mg 200 mL/hr over 60 Minutes Intravenous 2 times daily 03/14/18 2338 03/15/18 0707   03/14/18 2315  cefTRIAXone (ROCEPHIN) 2 g in sodium chloride 0.9 % 100 mL IVPB     2 g 200 mL/hr over 30 Minutes Intravenous  Once 03/14/18 2308 03/14/18 2337   03/14/18 2315  metroNIDAZOLE (FLAGYL) IVPB 500 mg     500 mg 100 mL/hr over 60 Minutes Intravenous  Once 03/14/18 2308 03/15/18 0038       Assessment/Plan Diverticulitis with  focal perforation -c. Diff negative.  Diarrhea improving -pain improved.  Will advance to clear liquids today Hypokalemia -3.5 today Depression GERD HTN PTSD Obesity  FEN - clear liquids VTE - Lovenox/SCDs ID - Rocephin 9/14--> refused 9/15, 9/16  Flagyl 9/14 -->  Aztreonam 9/16 -->  LOS: 4 days    Henreitta Cea , [facility] Surgery 03/18/2018, 9:09 AM Pager: [phone]

## 2018-03-19 DIAGNOSIS — K5720 Diverticulitis of large intestine with perforation and abscess without bleeding: Secondary | ICD-10-CM

## 2018-03-19 DIAGNOSIS — A419 Sepsis, unspecified organism: Principal | ICD-10-CM

## 2018-03-19 DIAGNOSIS — F419 Anxiety disorder, unspecified: Secondary | ICD-10-CM

## 2018-03-19 DIAGNOSIS — N183 Chronic kidney disease, stage 3 (moderate): Secondary | ICD-10-CM

## 2018-03-19 NOTE — Progress Notes (Signed)
The patient inquired about individuals accessing her chart that are not in direct care contact with her. Will give the pt number for audit & compliance [phone]

## 2018-03-19 NOTE — Progress Notes (Signed)
Patient ID: Tamara [name], female   DOB: [birth year], 57 y.o.   MRN: [id number]       Subjective: Pt is stressed because of a situation with her mother who has dementia.  She states this gives her crampy pains at times.  Overall pressure feeling in her abdomen is better.  Diarrhea is still present, but improving.  Tolerating clear liquids.  Objective: Vital signs in last 24 hours: Temp:  [98 F (36.7 C)-99.1 F (37.3 C)] 98 F (36.7 C) (09/18 4098) Pulse Rate:  [76-88] 77 (09/18 0608) Resp:  [16-18] 16 (09/18 0608) BP: (129-137)/(81-90) 129/81 (09/18 0608) SpO2:  [97 %-100 %] 97 % (09/18 0608) Last BM Date: 03/18/18  Intake/Output from previous day: 09/17 0701 - 09/18 0700 In: 3916.9 [P.O.:220; I.V.:1403.7; IV Piggyback:2293.2] Out: -  Intake/Output this shift: No intake/output data recorded.  PE: Heart: regular Lungs: CTAB Abd: soft, less tender, ND, +BS  Lab Results:  Recent Labs    03/18/18 0418  WBC 5.0  HGB 11.6*  HCT 33.8*  PLT 189   BMET Recent Labs    03/17/18 0417 03/18/18 0418  NA 138 141  K 3.4* 3.5  CL 107 109  CO2 22 24  GLUCOSE 117* 111*  BUN 7 7  CREATININE 0.85 0.87  CALCIUM 8.7* 9.1   PT/INR No results for input(s): LABPROT, INR in the last 72 hours. CMP     Component Value Date/Time   NA 141 03/18/2018 0418   K 3.5 03/18/2018 0418   CL 109 03/18/2018 0418   CO2 24 03/18/2018 0418   GLUCOSE 111 (H) 03/18/2018 0418   BUN 7 03/18/2018 0418   CREATININE 0.87 03/18/2018 0418   CREATININE 1.12 (H) 10/05/2015 1540   CALCIUM 9.1 03/18/2018 0418   CALCIUM 11.8 (H) 03/13/2010 2236   PROT 7.5 03/14/2018 2147   ALBUMIN 4.2 03/14/2018 2147   AST 28 03/14/2018 2147   ALT 22 03/14/2018 2147   ALKPHOS 78 03/14/2018 2147   BILITOT 1.1 03/14/2018 2147   GFRNONAA >60 03/18/2018 0418   GFRNONAA 56 (L) 10/05/2015 1540   GFRAA >60 03/18/2018 0418   GFRAA 65 10/05/2015 1540   Lipase     Component Value Date/Time   LIPASE 55 (H) 03/14/2018  2147       Studies/Results: No results found.  Anti-infectives: Anti-infectives (From admission, onward)   Start     Dose/Rate Route Frequency Ordered Stop   03/17/18 2230  aztreonam (AZACTAM) 2 g in sodium chloride 0.9 % 100 mL IVPB     2 g 200 mL/hr over 30 Minutes Intravenous Every 8 hours 03/17/18 2202     03/15/18 2200  cefTRIAXone (ROCEPHIN) 2 g in sodium chloride 0.9 % 100 mL IVPB  Status:  Discontinued     2 g 200 mL/hr over 30 Minutes Intravenous Every 24 hours 03/15/18 0713 03/17/18 2203   03/15/18 0800  metroNIDAZOLE (FLAGYL) IVPB 500 mg     500 mg 100 mL/hr over 60 Minutes Intravenous Every 8 hours 03/15/18 0713     03/15/18 0000  ciprofloxacin (CIPRO) IVPB 400 mg  Status:  Discontinued     400 mg 200 mL/hr over 60 Minutes Intravenous 2 times daily 03/14/18 2338 03/15/18 0707   03/14/18 2315  cefTRIAXone (ROCEPHIN) 2 g in sodium chloride 0.9 % 100 mL IVPB     2 g 200 mL/hr over 30 Minutes Intravenous  Once 03/14/18 2308 03/14/18 2337   03/14/18 2315  metroNIDAZOLE (FLAGYL) IVPB 500 mg  500 mg 100 mL/hr over 60 Minutes Intravenous  Once 03/14/18 2308 03/15/18 0038       Assessment/Plan Diverticulitis with focal perforation -diarrhea continues to improve -adv to full liquids -cont abx therapy Hypokalemia -resolved yesterday Depression GERD HTN PTSD Obesity  FEN -full liquids VTE -Lovenox/SCDs ID -Rocephin 9/14--> refused 9/15, 9/16  Flagyl 9/14 -->  Aztreonam 9/16 -->   LOS: 5 days    Henreitta Cea , [facility] Surgery 03/19/2018, 8:58 AM Pager: [phone]

## 2018-03-19 NOTE — Progress Notes (Signed)
PROGRESS NOTE    Tamara [name]  QMG:[id number] DOB: [birth year] DOA: 03/14/2018 PCP: Jilda Panda, MD   Brief Narrative: Tamara [name] is a 57 y.o. female with a history of hypertension, hyperlipidemia, depression, anxiety, PTSD, GERD, diverticulitis. She presented secondary to abdominal pain and found to have diverticulitis with perforation. She was started on antibiotics. She has associated diarrhea which is improving but still persistent.   Assessment & Plan:   Principal Problem:   Sepsis ([place]) Active Problems:   Anxiety   Diverticulitis of colon with perforation   CKD (chronic kidney disease), stage III ([place])   Diverticulitis with perforation Patient is improving with IV antibiotics. General surgery on board -General surgery recommendations: Advancing diet slowly -Continue Aztreonam and metronidazole  Diarrhea In setting of diverticulitis. C. Difficile negative. Improving but still persistent. -Continue IV fluids  Chronic kidney disease stage III Stable.  Anxiety/PTSD -Continue Xanax, Restoril, vilazodone  GERD -Continue Dexilant  Hypokalemia Supplementation given  Essential hypertension -Continue losartan    DVT prophylaxis: Lovenox Code Status:   Code Status: Full Code Family Communication: None at bedside Disposition Plan: Discharge pending general surgery recommendations   Consultants:   General surgery  Procedures:   None  Antimicrobials:  Ceftriaxone  Ciprofloxacin  Metronidazole  Aztreonam    Subjective: Diarrhea today. Slightly improved.  Objective: Vitals:   03/18/18 2051 03/19/18 0608 03/19/18 0925 03/19/18 1313  BP: 132/90 129/81 (!) 136/91 119/87  Pulse: 76 77 81 66  Resp: 18 16    Temp: 99.1 F (37.3 C) 98 F (36.7 C)  98.4 F (36.9 C)  TempSrc: Oral Oral  Oral  SpO2: 99% 97% 97% 99%  Weight:      Height:        Intake/Output Summary (Last 24 hours) at 03/19/2018 1624 Last data filed at 03/19/2018  1500 Gross per 24 hour  Intake 4646.64 ml  Output -  Net 4646.64 ml   Filed Weights   03/14/18 2138 03/15/18 0104  Weight: 83.9 kg 88 kg    Examination:  General exam: Appears calm and comfortable Respiratory system: Clear to auscultation. Respiratory effort normal. Cardiovascular system: S1 & S2 heard, RRR. No murmurs, rubs, gallops or clicks. Gastrointestinal system: Abdomen is nondistended, soft and nontender. No organomegaly or masses felt. Normal bowel sounds heard. Central nervous system: Alert and oriented. No focal neurological deficits. Extremities: No edema. No calf tenderness Skin: No cyanosis. No rashes Psychiatry: Judgement and insight appear normal. Mood & affect appropriate.     Data Reviewed: I have personally reviewed following labs and imaging studies  CBC: Recent Labs  Lab 03/14/18 2147 03/15/18 0441 03/16/18 0433 03/18/18 0418  WBC 11.7* 11.8* 10.3 5.0  NEUTROABS  --   --  7.6  --   HGB 13.2 11.4* 11.5* 11.6*  HCT 38.9 33.7* 35.2* 33.8*  MCV 86.3 86.9 89.1 84.3  PLT 252 203 214 326   Basic Metabolic Panel: Recent Labs  Lab 03/14/18 2147 03/15/18 0441 03/16/18 0433 03/17/18 0417 03/18/18 0418  NA 138 141 141 138 141  K 3.5 3.7 3.2* 3.4* 3.5  CL 103 109 110 107 109  CO2 22 23 22 22 24   GLUCOSE 126* 119* 84 117* 111*  BUN 23* 18 9 7 7   CREATININE 1.21* 1.12* 0.98 0.85 0.87  CALCIUM 9.8 8.7* 8.8* 8.7* 9.1   GFR: Estimated Creatinine Clearance: 74.4 mL/min (by C-G formula based on SCr of 0.87 mg/dL). Liver Function Tests: Recent Labs  Lab 03/14/18 2147  AST  28  ALT 22  ALKPHOS 78  BILITOT 1.1  PROT 7.5  ALBUMIN 4.2   Recent Labs  Lab 03/14/18 2147  LIPASE 55*   No results for input(s): AMMONIA in the last 168 hours. Coagulation Profile: Recent Labs  Lab 03/14/18 2147  INR 1.02   Cardiac Enzymes: No results for input(s): CKTOTAL, CKMB, CKMBINDEX, TROPONINI in the last 168 hours. BNP (last 3 results) No results for  input(s): PROBNP in the last 8760 hours. HbA1C: No results for input(s): HGBA1C in the last 72 hours. CBG: No results for input(s): GLUCAP in the last 168 hours. Lipid Profile: No results for input(s): CHOL, HDL, LDLCALC, TRIG, CHOLHDL, LDLDIRECT in the last 72 hours. Thyroid Function Tests: No results for input(s): TSH, T4TOTAL, FREET4, T3FREE, THYROIDAB in the last 72 hours. Anemia Panel: No results for input(s): VITAMINB12, FOLATE, FERRITIN, TIBC, IRON, RETICCTPCT in the last 72 hours. Sepsis Labs: Recent Labs  Lab 03/14/18 2147 03/15/18 0136  PROCALCITON <0.10  --   LATICACIDVEN  --  1.7    Recent Results (from the past 240 hour(s))  Urine culture     Status: None   Collection Time: 03/14/18 11:50 PM  Result Value Ref Range Status   Specimen Description   Final    URINE, RANDOM Performed at [facility], [place] [address] [place], [place] [address]    Special Requests   Final    NONE Performed at [facility], [place] [address] [place], [place] [address]    Culture   Final    NO GROWTH Performed at [facility], [place] [address], [place], [place] [address]    Report Status 03/16/2018 FINAL  Final  Culture, blood (x 2)     Status: None (Preliminary result)   Collection Time: 03/15/18  1:36 AM  Result Value Ref Range Status   Specimen Description BLOOD RIGHT HAND  Final   Special Requests   Final    BOTTLES DRAWN AEROBIC AND ANAEROBIC Blood Culture adequate volume   Culture   Final    NO GROWTH 4 DAYS Performed at [facility], [place] [address] [place], [place] [address]    Report Status PENDING  Incomplete  Culture, blood (x 2)     Status: None (Preliminary result)   Collection Time: 03/15/18  1:36 AM  Result Value Ref Range Status   Specimen Description BLOOD LEFT HAND  Final   Special Requests   Final    BOTTLES DRAWN AEROBIC AND ANAEROBIC Blood Culture adequate volume   Culture   Final    NO GROWTH 4  DAYS Performed at [facility], [place] [address], [place], [place] [address]    Report Status PENDING  Incomplete  C difficile quick scan w PCR reflex     Status: None   Collection Time: 03/17/18 10:37 AM  Result Value Ref Range Status   C Diff antigen NEGATIVE NEGATIVE Final   C Diff toxin NEGATIVE NEGATIVE Final   C Diff interpretation No C. difficile detected.  Final    Comment: Performed at [facility], [place] [address] [place], [place] [address]  Gastrointestinal Panel by PCR , Stool     Status: None   Collection Time: 03/17/18 10:37 AM  Result Value Ref Range Status   Campylobacter species NOT DETECTED NOT DETECTED Final   Plesimonas shigelloides NOT DETECTED NOT DETECTED Final   Salmonella species NOT DETECTED NOT DETECTED Final   Yersinia enterocolitica  NOT DETECTED NOT DETECTED Final   Vibrio species NOT DETECTED NOT DETECTED Final   Vibrio cholerae NOT DETECTED NOT DETECTED Final   Enteroaggregative E coli (EAEC) NOT DETECTED NOT DETECTED Final   Enteropathogenic E coli (EPEC) NOT DETECTED NOT DETECTED Final   Enterotoxigenic E coli (ETEC) NOT DETECTED NOT DETECTED Final   Shiga like toxin producing E coli (STEC) NOT DETECTED NOT DETECTED Final   Shigella/Enteroinvasive E coli (EIEC) NOT DETECTED NOT DETECTED Final   Cryptosporidium NOT DETECTED NOT DETECTED Final   Cyclospora cayetanensis NOT DETECTED NOT DETECTED Final   Entamoeba histolytica NOT DETECTED NOT DETECTED Final   Giardia lamblia NOT DETECTED NOT DETECTED Final   Adenovirus F40/41 NOT DETECTED NOT DETECTED Final   Astrovirus NOT DETECTED NOT DETECTED Final   Norovirus GI/GII NOT DETECTED NOT DETECTED Final   Rotavirus A NOT DETECTED NOT DETECTED Final   Sapovirus (I, II, IV, and V) NOT DETECTED NOT DETECTED Final    Comment: Performed at [facility], [address] [place], [place] [address]         Radiology Studies: No results found.      Scheduled Meds: .  ALPRAZolam  2 mg Oral QHS  . dexlansoprazole  60 mg Oral Daily  . enoxaparin (LOVENOX) injection  40 mg Subcutaneous Q24H  . losartan  100 mg Oral Daily  . ondansetron (ZOFRAN) IV  4 mg Intravenous Once  . Oxcarbazepine  300 mg Oral Daily  . sodium chloride flush  3 mL Intravenous Q12H  . temazepam  45 mg Oral QHS  . Vilazodone HCl  30 mg Oral Daily   Continuous Infusions: . aztreonam 200 mL/hr at 03/19/18 1500  . dextrose 5 % and 0.9% NaCl Stopped (03/19/18 1451)  . metronidazole Stopped (03/19/18 0914)     LOS: 5 days     Cordelia Poche, MD [facility] 03/19/2018, 4:24 PM  If 7PM-7AM, please contact night-coverage [email or web address] 03/19/2018, 4:24 PM

## 2018-03-20 LAB — CULTURE, BLOOD (ROUTINE X 2)
CULTURE: NO GROWTH
Culture: NO GROWTH
SPECIAL REQUESTS: ADEQUATE
Special Requests: ADEQUATE

## 2018-03-20 MED ORDER — ULTIMATE PROBIOTIC FORMULA PO CAPS
1.0000 | ORAL_CAPSULE | Freq: Every day | ORAL | Status: DC
  Administered 2018-03-20: 1 via ORAL

## 2018-03-20 MED ORDER — NON FORMULARY: 1.0000 | Freq: Every day | Status: DC

## 2018-03-20 NOTE — Progress Notes (Signed)
PROGRESS NOTE    Tamara [name]  RJJ:[id number] DOB: May 08, 1961 DOA: 03/14/2018 PCP: Jilda Panda, MD   Brief Narrative: Tamara [name] is a 57 y.o. female with a history of hypertension, hyperlipidemia, depression, anxiety, PTSD, GERD, diverticulitis. She presented secondary to abdominal pain and found to have diverticulitis with perforation. She was started on antibiotics. She has associated diarrhea which is improving but still persistent.   Assessment & Plan:   Principal Problem:   Sepsis ([place]) Active Problems:   Anxiety   Diverticulitis of colon with perforation   CKD (chronic kidney disease), stage III ([place])   Diverticulitis with perforation Patient is improving with IV antibiotics. General surgery on board -General surgery recommendations: Advancing diet slowly (soft diet) -Continue Aztreonam and metronidazole  Diarrhea In setting of diverticulitis. C. Difficile negative. Continues to improve -Continue IV fluids until diarrhea reliably resolved or manageable via oral intake  Chronic kidney disease stage III Stable.  Anxiety/PTSD -Continue Xanax, Restoril, vilazodone  GERD -Continue Dexilant  Hypokalemia Supplementation given  Essential hypertension -Continue losartan    DVT prophylaxis: Lovenox Code Status:   Code Status: Full Code Family Communication: None at bedside Disposition Plan: Discharge pending general surgery recommendations   Consultants:   General surgery  Procedures:   None  Antimicrobials:  Ceftriaxone  Ciprofloxacin  Metronidazole  Aztreonam    Subjective: Tolerated full liquid yesterday.  Objective: Vitals:   03/19/18 0925 03/19/18 1313 03/19/18 2047 03/20/18 0429  BP: (!) 136/91 119/87 (!) 129/95 98/69  Pulse: 81 66 84 85  Resp:   18 16  Temp:  98.4 F (36.9 C) 98.5 F (36.9 C) 98.5 F (36.9 C)  TempSrc:  Oral Oral Oral  SpO2: 97% 99% 99% 98%  Weight:      Height:        Intake/Output Summary  (Last 24 hours) at 03/20/2018 0758 Last data filed at 03/20/2018 0500 Gross per 24 hour  Intake 2613.44 ml  Output -  Net 2613.44 ml   Filed Weights   03/14/18 2138 03/15/18 0104  Weight: 83.9 kg 88 kg    Examination:  General exam: Appears calm and comfortable Respiratory system: Clear to auscultation. Respiratory effort normal. Cardiovascular system: S1 & S2 heard, RRR. No murmurs, rubs, gallops or clicks. Gastrointestinal system: Abdomen is nondistended, soft and nontender. Normal bowel sounds heard. Central nervous system: Alert and oriented. No focal neurological deficits. Extremities: No edema. No calf tenderness Skin: No cyanosis. No rashes Psychiatry: Judgement and insight appear normal. Mood & affect appropriate.     Data Reviewed: I have personally reviewed following labs and imaging studies  CBC: Recent Labs  Lab 03/14/18 2147 03/15/18 0441 03/16/18 0433 03/18/18 0418  WBC 11.7* 11.8* 10.3 5.0  NEUTROABS  --   --  7.6  --   HGB 13.2 11.4* 11.5* 11.6*  HCT 38.9 33.7* 35.2* 33.8*  MCV 86.3 86.9 89.1 84.3  PLT 252 203 214 016   Basic Metabolic Panel: Recent Labs  Lab 03/14/18 2147 03/15/18 0441 03/16/18 0433 03/17/18 0417 03/18/18 0418  NA 138 141 141 138 141  K 3.5 3.7 3.2* 3.4* 3.5  CL 103 109 110 107 109  CO2 22 23 22 22 24   GLUCOSE 126* 119* 84 117* 111*  BUN 23* 18 9 7 7   CREATININE 1.21* 1.12* 0.98 0.85 0.87  CALCIUM 9.8 8.7* 8.8* 8.7* 9.1   GFR: Estimated Creatinine Clearance: 74.4 mL/min (by C-G formula based on SCr of 0.87 mg/dL). Liver Function Tests: Recent Labs  Lab 03/14/18 2147  AST 28  ALT 22  ALKPHOS 78  BILITOT 1.1  PROT 7.5  ALBUMIN 4.2   Recent Labs  Lab 03/14/18 2147  LIPASE 55*   No results for input(s): AMMONIA in the last 168 hours. Coagulation Profile: Recent Labs  Lab 03/14/18 2147  INR 1.02   Cardiac Enzymes: No results for input(s): CKTOTAL, CKMB, CKMBINDEX, TROPONINI in the last 168 hours. BNP (last  3 results) No results for input(s): PROBNP in the last 8760 hours. HbA1C: No results for input(s): HGBA1C in the last 72 hours. CBG: No results for input(s): GLUCAP in the last 168 hours. Lipid Profile: No results for input(s): CHOL, HDL, LDLCALC, TRIG, CHOLHDL, LDLDIRECT in the last 72 hours. Thyroid Function Tests: No results for input(s): TSH, T4TOTAL, FREET4, T3FREE, THYROIDAB in the last 72 hours. Anemia Panel: No results for input(s): VITAMINB12, FOLATE, FERRITIN, TIBC, IRON, RETICCTPCT in the last 72 hours. Sepsis Labs: Recent Labs  Lab 03/14/18 2147 03/15/18 0136  PROCALCITON <0.10  --   LATICACIDVEN  --  1.7    Recent Results (from the past 240 hour(s))  Urine culture     Status: None   Collection Time: 03/14/18 11:50 PM  Result Value Ref Range Status   Specimen Description   Final    URINE, RANDOM Performed at [facility], [place] [address] [place], [place] [address]    Special Requests   Final    NONE Performed at [facility], [place] [address] [place], [place] [address]    Culture   Final    NO GROWTH Performed at [facility], [place] [address] [place], [place] [address]    Report Status 03/16/2018 FINAL  Final  Culture, blood (x 2)     Status: None   Collection Time: 03/15/18  1:36 AM  Result Value Ref Range Status   Specimen Description BLOOD RIGHT HAND  Final   Special Requests   Final    BOTTLES DRAWN AEROBIC AND ANAEROBIC Blood Culture adequate volume   Culture   Final    NO GROWTH 5 DAYS Performed at [facility], [place] [address] [place], [place] [address]    Report Status 03/20/2018 FINAL  Final  Culture, blood (x 2)     Status: None   Collection Time: 03/15/18  1:36 AM  Result Value Ref Range Status   Specimen Description BLOOD LEFT HAND  Final   Special Requests   Final    BOTTLES DRAWN AEROBIC AND ANAEROBIC Blood Culture adequate volume   Culture   Final    NO GROWTH 5 DAYS Performed at [facility], [place] [address] [place], [place] [address]    Report Status 03/20/2018 FINAL  Final  C difficile quick scan w PCR reflex     Status: None   Collection Time: 03/17/18 10:37 AM  Result Value Ref Range Status   C Diff antigen NEGATIVE NEGATIVE Final   C Diff toxin NEGATIVE NEGATIVE Final   C Diff interpretation No C. difficile detected.  Final    Comment: Performed at [facility], [place] [address] [place], [place] [address]  Gastrointestinal Panel by PCR , Stool     Status: None   Collection Time: 03/17/18 10:37 AM  Result Value Ref Range Status   Campylobacter species NOT DETECTED NOT DETECTED Final   Plesimonas shigelloides NOT DETECTED NOT DETECTED Final   Salmonella species NOT DETECTED NOT DETECTED Final  Yersinia enterocolitica NOT DETECTED NOT DETECTED Final   Vibrio species NOT DETECTED NOT DETECTED Final   Vibrio cholerae NOT DETECTED NOT DETECTED Final   Enteroaggregative E coli (EAEC) NOT DETECTED NOT DETECTED Final   Enteropathogenic E coli (EPEC) NOT DETECTED NOT DETECTED Final   Enterotoxigenic E coli (ETEC) NOT DETECTED NOT DETECTED Final   Shiga like toxin producing E coli (STEC) NOT DETECTED NOT DETECTED Final   Shigella/Enteroinvasive E coli (EIEC) NOT DETECTED NOT DETECTED Final   Cryptosporidium NOT DETECTED NOT DETECTED Final   Cyclospora cayetanensis NOT DETECTED NOT DETECTED Final   Entamoeba histolytica NOT DETECTED NOT DETECTED Final   Giardia lamblia NOT DETECTED NOT DETECTED Final   Adenovirus F40/41 NOT DETECTED NOT DETECTED Final   Astrovirus NOT DETECTED NOT DETECTED Final   Norovirus GI/GII NOT DETECTED NOT DETECTED Final   Rotavirus A NOT DETECTED NOT DETECTED Final   Sapovirus (I, II, IV, and V) NOT DETECTED NOT DETECTED Final    Comment: Performed at [facility], [address], [place], [place] [address]         Radiology Studies: No results found.      Scheduled Meds: . ALPRAZolam  2 mg Oral  QHS  . dexlansoprazole  60 mg Oral Daily  . enoxaparin (LOVENOX) injection  40 mg Subcutaneous Q24H  . losartan  100 mg Oral Daily  . ondansetron (ZOFRAN) IV  4 mg Intravenous Once  . Oxcarbazepine  300 mg Oral Daily  . sodium chloride flush  3 mL Intravenous Q12H  . temazepam  45 mg Oral QHS  . Vilazodone HCl  30 mg Oral Daily   Continuous Infusions: . aztreonam 2 g (03/20/18 0600)  . dextrose 5 % and 0.9% NaCl Stopped (03/19/18 1451)  . metronidazole Stopped (03/19/18 2359)     LOS: 6 days     Cordelia Poche, MD [facility] 03/20/2018, 7:58 AM  If 7PM-7AM, please contact night-coverage [email or web address] 03/20/2018, 7:58 AM

## 2018-03-20 NOTE — Progress Notes (Signed)
[facility] Progress Note     Subjective: CC-  Feeling better today from an abdominal standpoint. Patient reports mild RLQ soreness with movement, but overall much better. Tolerating full liquids without increased pain. Denies n/v. VSS. Diarrhea improved, she had a small BM yesterday. Taking probiotic from home.  Objective: Vital signs in last 24 hours: Temp:  [98.4 F (36.9 C)-98.5 F (36.9 C)] 98.5 F (36.9 C) (09/19 0429) Pulse Rate:  [66-85] 85 (09/19 0429) Resp:  [16-18] 16 (09/19 0429) BP: (98-129)/(69-95) 98/69 (09/19 0429) SpO2:  [98 %-99 %] 98 % (09/19 0429) Last BM Date: 03/19/18  Intake/Output from previous day: 09/18 0701 - 09/19 0700 In: 2613.4 [P.O.:360; I.V.:1325.2; IV Piggyback:928.2] Out: -  Intake/Output this shift: No intake/output data recorded.  PE: Gen:  Alert, NAD HEENT: EOM's intact, pupils equal and round Card:  RRR Pulm:  CTAB, no W/R/R, effort normal Abd: Soft, ND, mild subjective lower abdominal TTP RLQ>LLQ, +BS Ext:  Calves soft and nontender Psych: A&Ox3  Skin: no rashes noted, warm and dry  Lab Results:  Recent Labs    03/18/18 0418  WBC 5.0  HGB 11.6*  HCT 33.8*  PLT 189   BMET Recent Labs    03/18/18 0418  NA 141  K 3.5  CL 109  CO2 24  GLUCOSE 111*  BUN 7  CREATININE 0.87  CALCIUM 9.1   PT/INR No results for input(s): LABPROT, INR in the last 72 hours. CMP     Component Value Date/Time   NA 141 03/18/2018 0418   K 3.5 03/18/2018 0418   CL 109 03/18/2018 0418   CO2 24 03/18/2018 0418   GLUCOSE 111 (H) 03/18/2018 0418   BUN 7 03/18/2018 0418   CREATININE 0.87 03/18/2018 0418   CREATININE 1.12 (H) 10/05/2015 1540   CALCIUM 9.1 03/18/2018 0418   CALCIUM 11.8 (H) 03/13/2010 2236   PROT 7.5 03/14/2018 2147   ALBUMIN 4.2 03/14/2018 2147   AST 28 03/14/2018 2147   ALT 22 03/14/2018 2147   ALKPHOS 78 03/14/2018 2147   BILITOT 1.1 03/14/2018 2147   GFRNONAA >60 03/18/2018 0418   GFRNONAA 56 (L)  10/05/2015 1540   GFRAA >60 03/18/2018 0418   GFRAA 65 10/05/2015 1540   Lipase     Component Value Date/Time   LIPASE 55 (H) 03/14/2018 2147       Studies/Results: No results found.  Anti-infectives: Anti-infectives (From admission, onward)   Start     Dose/Rate Route Frequency Ordered Stop   03/17/18 2230  aztreonam (AZACTAM) 2 g in sodium chloride 0.9 % 100 mL IVPB     2 g 200 mL/hr over 30 Minutes Intravenous Every 8 hours 03/17/18 2202     03/15/18 2200  cefTRIAXone (ROCEPHIN) 2 g in sodium chloride 0.9 % 100 mL IVPB  Status:  Discontinued     2 g 200 mL/hr over 30 Minutes Intravenous Every 24 hours 03/15/18 0713 03/17/18 2203   03/15/18 0800  metroNIDAZOLE (FLAGYL) IVPB 500 mg     500 mg 100 mL/hr over 60 Minutes Intravenous Every 8 hours 03/15/18 0713     03/15/18 0000  ciprofloxacin (CIPRO) IVPB 400 mg  Status:  Discontinued     400 mg 200 mL/hr over 60 Minutes Intravenous 2 times daily 03/14/18 2338 03/15/18 0707   03/14/18 2315  cefTRIAXone (ROCEPHIN) 2 g in sodium chloride 0.9 % 100 mL IVPB     2 g 200 mL/hr over 30 Minutes Intravenous  Once 03/14/18 2308  03/14/18 2337   03/14/18 2315  metroNIDAZOLE (FLAGYL) IVPB 500 mg     500 mg 100 mL/hr over 60 Minutes Intravenous  Once 03/14/18 2308 03/15/18 0038       Assessment/Plan Diverticulitis with focal perforation -Advance to soft diet. Continue IV abx today. If tolerating diet and pain controlled ok for discharge this afternoon on oral abx from surgical standpoint. Recommend follow up with GI and surgeon since this was her second bout of diverticulitis and she had a focal perforation in her colon. Hypokalemia -resolved  Depression GERD HTN PTSD Obesity  FEN - soft diet VTE -Lovenox/SCDs ID -Rocephin 9/14--> refused 9/15, 9/16Flagyl 9/14 -->Aztreonam 9/16 -->   LOS: 6 days    [place]ellington [place] , [facility] 03/20/2018, 10:38 AM Pager: [phone] Consults: [phone] Mon  7:00 am -11:30 AM Tues-Fri 7:00 am-4:30 pm Sat-Sun 7:00 am-11:30 am

## 2018-03-21 LAB — HIV ANTIBODY (ROUTINE TESTING W REFLEX): HIV SCREEN 4TH GENERATION: NONREACTIVE

## 2018-03-21 MED ORDER — CIPROFLOXACIN HCL 500 MG PO TABS: 500.0000 mg | ORAL_TABLET | Freq: Two times a day (BID) | ORAL | 0 refills | Status: AC

## 2018-03-21 MED ORDER — CIPROFLOXACIN HCL 500 MG PO TABS
500.0000 mg | ORAL_TABLET | Freq: Two times a day (BID) | ORAL | Status: DC
  Administered 2018-03-21: 500 mg via ORAL
  Filled 2018-03-21: qty 1

## 2018-03-21 MED ORDER — METRONIDAZOLE 500 MG PO TABS: 500.0000 mg | ORAL_TABLET | Freq: Three times a day (TID) | ORAL | 0 refills | Status: AC

## 2018-03-21 MED ORDER — METRONIDAZOLE 500 MG PO TABS
500.0000 mg | ORAL_TABLET | Freq: Three times a day (TID) | ORAL | Status: DC
  Administered 2018-03-21: 500 mg via ORAL
  Filled 2018-03-21: qty 1

## 2018-03-21 NOTE — Progress Notes (Signed)
Patient ID: Tamara [name], female   DOB: 1961-03-28, 57 y.o.   MRN: [id number]       Subjective: Patient feels well today.  No complaints.  Had a solid BM.  Tolerating a soft diet.  Objective: Vital signs in last 24 hours: Temp:  [98 F (36.7 C)-98.7 F (37.1 C)] 98.7 F (37.1 C) (09/20 0453) Pulse Rate:  [78-97] 97 (09/20 0453) Resp:  [16-18] 16 (09/20 0453) BP: (103-134)/(75-90) 123/84 (09/20 0453) SpO2:  [99 %-100 %] 99 % (09/20 0453) Last BM Date: 03/19/18  Intake/Output from previous day: 09/19 0701 - 09/20 0700 In: 969.9 [I.V.:592; IV Piggyback:377.9] Out: -  Intake/Output this shift: No intake/output data recorded.  PE: Abd: soft, NT, ND, +BS  Lab Results:  No results for input(s): WBC, HGB, HCT, PLT in the last 72 hours. BMET No results for input(s): NA, K, CL, CO2, GLUCOSE, BUN, CREATININE, CALCIUM in the last 72 hours. PT/INR No results for input(s): LABPROT, INR in the last 72 hours. CMP     Component Value Date/Time   NA 141 03/18/2018 0418   K 3.5 03/18/2018 0418   CL 109 03/18/2018 0418   CO2 24 03/18/2018 0418   GLUCOSE 111 (H) 03/18/2018 0418   BUN 7 03/18/2018 0418   CREATININE 0.87 03/18/2018 0418   CREATININE 1.12 (H) 10/05/2015 1540   CALCIUM 9.1 03/18/2018 0418   CALCIUM 11.8 (H) 03/13/2010 2236   PROT 7.5 03/14/2018 2147   ALBUMIN 4.2 03/14/2018 2147   AST 28 03/14/2018 2147   ALT 22 03/14/2018 2147   ALKPHOS 78 03/14/2018 2147   BILITOT 1.1 03/14/2018 2147   GFRNONAA >60 03/18/2018 0418   GFRNONAA 56 (L) 10/05/2015 1540   GFRAA >60 03/18/2018 0418   GFRAA 65 10/05/2015 1540   Lipase     Component Value Date/Time   LIPASE 55 (H) 03/14/2018 2147       Studies/Results: No results found.  Anti-infectives: Anti-infectives (From admission, onward)   Start     Dose/Rate Route Frequency Ordered Stop   03/21/18 1400  metroNIDAZOLE (FLAGYL) tablet 500 mg     500 mg Oral Every 8 hours 03/21/18 1003 03/26/18 0559   03/21/18 1100   ciprofloxacin (CIPRO) tablet 500 mg     500 mg Oral 2 times daily 03/21/18 1003 03/26/18 0759   03/17/18 2230  aztreonam (AZACTAM) 2 g in sodium chloride 0.9 % 100 mL IVPB  Status:  Discontinued     2 g 200 mL/hr over 30 Minutes Intravenous Every 8 hours 03/17/18 2202 03/21/18 1003   03/15/18 2200  cefTRIAXone (ROCEPHIN) 2 g in sodium chloride 0.9 % 100 mL IVPB  Status:  Discontinued     2 g 200 mL/hr over 30 Minutes Intravenous Every 24 hours 03/15/18 0713 03/17/18 2203   03/15/18 0800  metroNIDAZOLE (FLAGYL) IVPB 500 mg  Status:  Discontinued     500 mg 100 mL/hr over 60 Minutes Intravenous Every 8 hours 03/15/18 0713 03/21/18 1003   03/15/18 0000  ciprofloxacin (CIPRO) IVPB 400 mg  Status:  Discontinued     400 mg 200 mL/hr over 60 Minutes Intravenous 2 times daily 03/14/18 2338 03/15/18 0707   03/14/18 2315  cefTRIAXone (ROCEPHIN) 2 g in sodium chloride 0.9 % 100 mL IVPB     2 g 200 mL/hr over 30 Minutes Intravenous  Once 03/14/18 2308 03/14/18 2337   03/14/18 2315  metroNIDAZOLE (FLAGYL) IVPB 500 mg     500 mg 100 mL/hr over 60  Minutes Intravenous  Once 03/14/18 2308 03/15/18 0038       Assessment/Plan Diverticulitis with focal perforation -tolerating a soft diet -ok for DC home on low fiber diet -3 more days of Cipro/Flagyl.  Patient informed me she CAN take Cirpo. -follow up in our office in 1 month -d/w Dr. Lonny Prude Hypokalemia -resolved  Depression GERD HTN PTSD Obesity  FEN - soft diet VTE -Lovenox/SCDs ID -Rocephin 9/14--> refused 9/15, 9/16Flagyl 9/14 -->Aztreonam 9/16 -->   LOS: 7 days    Henreitta Cea , [facility] 03/21/2018, 10:31 AM Pager: [phone]

## 2018-03-21 NOTE — Discharge Summary (Addendum)
Physician Discharge Summary  Tamara [name] OZD:[id number] DOB: July 14, 1960 DOA: 03/14/2018  PCP: Jilda Panda, MD  Admit date: 03/14/2018 Discharge date: 03/21/2018  Admitted From: Home Disposition: Home  Recommendations for Outpatient Follow-up:  1. Follow up with PCP in 1 week 2. Please obtain BMP/CBC in one week 3. Please follow up on the following pending results: None  Home Health: None Equipment/Devices: None  Discharge Condition: Stable CODE STATUS: Full code Diet recommendation: Low fiber diet   Brief/Interim Summary:  Admission HPI written by Norval Morton, MD   HPI: Tamara [name] is a 57 y.o. female with medical history significant of HTN, HLD, major depression, anxiety, PTSD, and GERD who presents with complaints of acute onset of severe lower abdominal pain after eating fried food this afternoon.  She describes as sharp and severe 10 out of 10 pain.  Nothing seemed to relieve pain symptoms and any movement worsen pain.  Associated symptoms included chills and diarrhea.  Last colonoscopy was completed by Dr. Henrene Pastor in 01/2017 where patient was noted to have multiple diverticula with retrosigmoid stenosis and internal hemorrhoids.  En route with EMS patient was given 100 mcg of fentanyl, 17 mg of ketamine with some pain relief.   ED Course: On admission to the emergency department patient was noted to be initially afebrile, pulse 10 3-1 28, respirations 16-22, blood pressure stable, and O2 saturation maintained on room air.  Labs revealed WBC 11.7.  CT scan revealed signs of diverticulitis with small contained perforation.  Sepsis protocol was not initially started and therefore no lactic acid was obtained.  Patient had initially been given 1 L normal saline IV fluids, started on Rocephin and metronidazole.  However, patient developed rash and itching with administration of Rocephin.   Hospital course:  Diverticulitis with perforation Patient started on empiric  Ceftriaxone and metronidazole. Ceftriaxone transitioned to Aztreonam and finally discharged Ciprofloxacin and Flagyl on discharge to complete 10 days. Low fiber diet.  Sepsis Secondary to diverticulitis with perforation. No   Diarrhea In setting of diverticulitis. C. Difficile negative. Blood cultures with no growth.  Chronic kidney disease stage III Stable.  Anxiety/PTSD Continued Xanax, Restoril, vilazodone  GERD Continued Dexilant  Hypokalemia Supplementation given  Essential hypertension Continued losartan  Discharge Diagnoses:  Principal Problem:   Sepsis ([place]) Active Problems:   Anxiety   Diverticulitis of colon with perforation   CKD (chronic kidney disease), stage III [facility])    Discharge Instructions  Discharge Instructions    Call MD for:  severe uncontrolled pain   Complete by:  As directed    Call MD for:  temperature >100.4   Complete by:  As directed    Diet - low sodium heart healthy   Complete by:  As directed    Increase activity slowly   Complete by:  As directed      Allergies as of 03/21/2018      Reactions   Benadryl [diphenhydramine] Anaphylaxis   Ceftriaxone Rash   Welts; Pt said she tolerates keflex   Darvon [propoxyphene] Other (See Comments)   Hallucinations   Dilaudid [hydromorphone Hcl]    HALLUCINATIONS   Gabapentin    Unknown reaction, patient cannot remember    Ivp Dye [iodinated Diagnostic Agents] Anaphylaxis, Swelling   Levaquin [levofloxacin] Nausea And Vomiting   Patient states she tolerates Cipro   Morphine And Related Itching   Penicillins Rash   Has patient had a PCN reaction causing immediate rash, facial/tongue/throat swelling, SOB or lightheadedness  with hypotension: yes Has patient had a PCN reaction causing severe rash involving mucus membranes or skin necrosis: No Has patient had a PCN reaction that required hospitalization: No Has patient had a PCN reaction occurring within the last 10 years: No If all  of the above answers are "NO", then may proceed with Cephalosporin use.   Propoxyphene Hcl Other (See Comments)   hallucination   Seroquel [quetiapine Fumerate]    Rectal bleeding, abdominal cramping   Statins Other (See Comments)   Severe mm pain (esp. Arms) >> DOES NOT WANT TO RETRY!   Sulfonamide Derivatives Rash   Welts   Adhesive [tape]    This is tape as well as adhesive on bandaids.   Dicyclomine    Vision loss in one eye   Other    All Anti-Histamines   Oxycodone Other (See Comments)   amnesia   Oxycontin [oxycodone Hcl]    Amnesia      Medication List    STOP taking these medications   baclofen 10 MG tablet Commonly known as:  LIORESAL   doxycycline 100 MG tablet Commonly known as:  VIBRA-TABS     TAKE these medications   albuterol 108 (90 Base) MCG/ACT inhaler Commonly known as:  PROVENTIL HFA;VENTOLIN HFA Inhale 2 puffs into the lungs every 6 (six) hours as needed for wheezing or shortness of breath.   ALPRAZolam 1 MG 24 hr tablet Commonly known as:  XANAX XR Take 2 mg by mouth at bedtime.   ALPRAZolam 1 MG tablet Commonly known as:  XANAX Take 0.5-1 mg by mouth 2 (two) times daily as needed for anxiety.   aspirin 325 MG tablet Take 1 tablet (325 mg total) by mouth daily.   ciprofloxacin 500 MG tablet Commonly known as:  CIPRO Take 1 tablet (500 mg total) by mouth 2 (two) times daily for 3 days.   dexlansoprazole 60 MG capsule Commonly known as:  DEXILANT Take 1 capsule (60 mg total) by mouth daily.   fenofibrate 145 MG tablet Commonly known as:  TRICOR Take 1 tablet (145 mg total) by mouth daily.   losartan 100 MG tablet Commonly known as:  COZAAR Take 1 tablet (100 mg total) by mouth daily.   LYSINE PO Take 1 tablet by mouth as needed. skin   metroNIDAZOLE 500 MG tablet Commonly known as:  FLAGYL Take 1 tablet (500 mg total) by mouth every 8 (eight) hours for 3 days.   nitroGLYCERIN 0.4 MG SL tablet Commonly known as:   NITROSTAT Place 1 tablet (0.4 mg total) under the tongue every 5 (five) minutes as needed for chest pain.   Oxcarbazepine 300 MG tablet Commonly known as:  TRILEPTAL Take 300 mg by mouth daily.   ranitidine 150 MG tablet Commonly known as:  ZANTAC Take 1 tablet (150 mg total) by mouth at bedtime. What changed:    when to take this  reasons to take this   temazepam 30 MG capsule Commonly known as:  RESTORIL Take 30 mg by mouth at bedtime.   temazepam 15 MG capsule Commonly known as:  RESTORIL Take 15 mg by mouth at bedtime.   VIIBRYD 10 MG Tabs Generic drug:  Vilazodone HCl Take 10 mg by mouth daily.   VIIBRYD 20 MG Tabs Generic drug:  Vilazodone HCl Take 20 mg by mouth daily.   Vitamin D3 50000 units Caps Take 50,000 Units by mouth once a week. What changed:  when to take this      Follow-up  Information    Irene Shipper, MD. Call.   Specialty:  Gastroenterology Why:  call to arrange follow up with your gastroenterologist Contact information: [address] [place] [address] [phone]        Johnathan Hausen, MD Follow up in 1 month(s).   Specialty:  General Surgery Why:  call to arrange follow up with general surgeon regarding diverticulitis Contact information: [address] [place] [address] [phone]          Allergies  Allergen Reactions  . Benadryl [Diphenhydramine] Anaphylaxis  . Ceftriaxone Rash    Welts; Pt said she tolerates keflex  . Darvon [Propoxyphene] Other (See Comments)    Hallucinations   . Dilaudid [Hydromorphone Hcl]     HALLUCINATIONS  . Gabapentin     Unknown reaction, patient cannot remember   . Ivp Dye [Iodinated Diagnostic Agents] Anaphylaxis and Swelling  . Levaquin [Levofloxacin] Nausea And Vomiting    Patient states she tolerates Cipro  . Morphine And Related Itching  . Penicillins Rash    Has patient had a PCN reaction causing immediate rash, facial/tongue/throat swelling, SOB or  lightheadedness with hypotension: yes Has patient had a PCN reaction causing severe rash involving mucus membranes or skin necrosis: No Has patient had a PCN reaction that required hospitalization: No Has patient had a PCN reaction occurring within the last 10 years: No If all of the above answers are "NO", then may proceed with Cephalosporin use.   Marland Kitchen Propoxyphene Hcl Other (See Comments)    hallucination  . Seroquel [Quetiapine Fumerate]     Rectal bleeding, abdominal cramping  . Statins Other (See Comments)    Severe mm pain (esp. Arms) >> DOES NOT WANT TO RETRY!  . Sulfonamide Derivatives Rash    Welts   . Adhesive [Tape]     This is tape as well as adhesive on bandaids.  . Dicyclomine     Vision loss in one eye  . Other     All Anti-Histamines  . Oxycodone Other (See Comments)    amnesia  . Oxycontin [Oxycodone Hcl]     Amnesia    Consultations:  General surgery   Procedures/Studies: Ct Abdomen Pelvis Wo Contrast  Result Date: 03/14/2018 CLINICAL DATA:  Abdominal pain EXAM: CT ABDOMEN AND PELVIS WITHOUT CONTRAST TECHNIQUE: Multidetector CT imaging of the abdomen and pelvis was performed following the standard protocol without IV contrast. COMPARISON:  CT 11/12/2016 FINDINGS: Lower chest: Lung bases are clear.  Small to moderate hiatal hernia. Hepatobiliary: Steatosis. Status post cholecystectomy. No biliary dilatation Pancreas: Unremarkable. No pancreatic ductal dilatation or surrounding inflammatory changes. Spleen: Normal in size without focal abnormality. Adrenals/Urinary Tract: Adrenal glands are unremarkable. Kidneys are normal, without renal calculi, focal lesion, or hydronephrosis. Bladder is unremarkable. Stomach/Bowel: Stomach is nonenlarged. No dilated small bowel. Negative appendix. Mild wall thickening of the distal sigmoid colon with surrounding inflammatory change and fluid. Small foci of extraluminal gas posteriorly. Vascular/Lymphatic: Nonaneurysmal aorta. Mild  aortic atherosclerosis. No significant adenopathy Reproductive: Status post hysterectomy. No adnexal masses. Other: Trace fluid in the pelvis. Musculoskeletal: No acute or significant osseous findings. IMPRESSION: 1. Mild wall thickening with surrounding edema and fluid at the distal sigmoid colon, suspected to represent acute diverticulitis. There are small foci of extraluminal gas in the region, concerning for contained perforation. There is no organized abscess. 2. Hepatic steatosis 3. Moderate hiatal hernia Electronically Signed   By: Donavan Foil M.D.   On: 03/14/2018 22:47  Subjective: Solid stool. Abdominal pain improved.  Discharge Exam: Vitals:   03/20/18 2134 03/21/18 0453  BP: 134/90 123/84  Pulse: 78 97  Resp: 18 16  Temp: 98.7 F (37.1 C) 98.7 F (37.1 C)  SpO2: 99% 99%   Vitals:   03/20/18 0429 03/20/18 1302 03/20/18 2134 03/21/18 0453  BP: 98/69 103/75 134/90 123/84  Pulse: 85 87 78 97  Resp: 16 18 18 16   Temp: 98.5 F (36.9 C) 98 F (36.7 C) 98.7 F (37.1 C) 98.7 F (37.1 C)  TempSrc: Oral Oral Oral Oral  SpO2: 98% 100% 99% 99%  Weight:      Height:        General: Pt is alert, awake, not in acute distress Cardiovascular: RRR, S1/S2 +, no rubs, no gallops Respiratory: CTA bilaterally, no wheezing, no rhonchi Abdominal: Soft, NT, ND, bowel sounds + Extremities: no edema, no cyanosis    The results of significant diagnostics from this hospitalization (including imaging, microbiology, ancillary and laboratory) are listed below for reference.     Microbiology: Recent Results (from the past 240 hour(s))  Urine culture     Status: None   Collection Time: 03/14/18 11:50 PM  Result Value Ref Range Status   Specimen Description   Final    URINE, RANDOM Performed at [facility] [address] [place], [place] [address]    Special Requests   Final    NONE Performed at [facility], [place] [address]  [place], [place] [address]    Culture   Final    NO GROWTH Performed at [facility], [place] [address] [place], [place] [address]    Report Status 03/16/2018 FINAL  Final  Culture, blood (x 2)     Status: None   Collection Time: 03/15/18  1:36 AM  Result Value Ref Range Status   Specimen Description BLOOD RIGHT HAND  Final   Special Requests   Final    BOTTLES DRAWN AEROBIC AND ANAEROBIC Blood Culture adequate volume   Culture   Final    NO GROWTH 5 DAYS Performed at [facility], [place] [address], [place], [place] [address]    Report Status 03/20/2018 FINAL  Final  Culture, blood (x 2)     Status: None   Collection Time: 03/15/18  1:36 AM  Result Value Ref Range Status   Specimen Description BLOOD LEFT HAND  Final   Special Requests   Final    BOTTLES DRAWN AEROBIC AND ANAEROBIC Blood Culture adequate volume   Culture   Final    NO GROWTH 5 DAYS Performed at [facility], [place] [address], [place], [place] [address]    Report Status 03/20/2018 FINAL  Final  C difficile quick scan w PCR reflex     Status: None   Collection Time: 03/17/18 10:37 AM  Result Value Ref Range Status   C Diff antigen NEGATIVE NEGATIVE Final   C Diff toxin NEGATIVE NEGATIVE Final   C Diff interpretation No C. difficile detected.  Final    Comment: Performed at [facility], [place] [address], [place],  [address]  Gastrointestinal Panel by PCR , Stool     Status: None   Collection Time: 03/17/18 10:37 AM  Result Value Ref Range Status   Campylobacter species NOT DETECTED NOT DETECTED Final   Plesimonas shigelloides NOT DETECTED NOT DETECTED Final   Salmonella species NOT DETECTED NOT DETECTED Final   Yersinia enterocolitica NOT  DETECTED NOT DETECTED Final   Vibrio species NOT DETECTED NOT DETECTED Final   Vibrio cholerae NOT DETECTED NOT DETECTED Final   Enteroaggregative E coli (EAEC) NOT DETECTED NOT DETECTED Final   Enteropathogenic E coli (EPEC) NOT DETECTED NOT  DETECTED Final   Enterotoxigenic E coli (ETEC) NOT DETECTED NOT DETECTED Final   Shiga like toxin producing E coli (STEC) NOT DETECTED NOT DETECTED Final   Shigella/Enteroinvasive E coli (EIEC) NOT DETECTED NOT DETECTED Final   Cryptosporidium NOT DETECTED NOT DETECTED Final   Cyclospora cayetanensis NOT DETECTED NOT DETECTED Final   Entamoeba histolytica NOT DETECTED NOT DETECTED Final   Giardia lamblia NOT DETECTED NOT DETECTED Final   Adenovirus F40/41 NOT DETECTED NOT DETECTED Final   Astrovirus NOT DETECTED NOT DETECTED Final   Norovirus GI/GII NOT DETECTED NOT DETECTED Final   Rotavirus A NOT DETECTED NOT DETECTED Final   Sapovirus (I, II, IV, and V) NOT DETECTED NOT DETECTED Final    Comment: Performed at [facility], [place] [place], [place] [address]     Labs: BNP (last 3 results) No results for input(s): BNP in the last 8760 hours. Basic Metabolic Panel: Recent Labs  Lab 03/14/18 2147 03/15/18 0441 03/16/18 0433 03/17/18 0417 03/18/18 0418  NA 138 141 141 138 141  K 3.5 3.7 3.2* 3.4* 3.5  CL 103 109 110 107 109  CO2 22 23 22 22 24   GLUCOSE 126* 119* 84 117* 111*  BUN 23* 18 9 7 7   CREATININE 1.21* 1.12* 0.98 0.85 0.87  CALCIUM 9.8 8.7* 8.8* 8.7* 9.1   Liver Function Tests: Recent Labs  Lab 03/14/18 2147  AST 28  ALT 22  ALKPHOS 78  BILITOT 1.1  PROT 7.5  ALBUMIN 4.2   Recent Labs  Lab 03/14/18 2147  LIPASE 55*   No results for input(s): AMMONIA in the last 168 hours. CBC: Recent Labs  Lab 03/14/18 2147 03/15/18 0441 03/16/18 0433 03/18/18 0418  WBC 11.7* 11.8* 10.3 5.0  NEUTROABS  --   --  7.6  --   HGB 13.2 11.4* 11.5* 11.6*  HCT 38.9 33.7* 35.2* 33.8*  MCV 86.3 86.9 89.1 84.3  PLT 252 203 214 189   Cardiac Enzymes: No results for input(s): CKTOTAL, CKMB, CKMBINDEX, TROPONINI in the last 168 hours. BNP: Invalid input(s): POCBNP CBG: No results for input(s): GLUCAP in the last 168 hours. D-Dimer No results for  input(s): DDIMER in the last 72 hours. Hgb A1c No results for input(s): HGBA1C in the last 72 hours. Lipid Profile No results for input(s): CHOL, HDL, LDLCALC, TRIG, CHOLHDL, LDLDIRECT in the last 72 hours. Thyroid function studies No results for input(s): TSH, T4TOTAL, T3FREE, THYROIDAB in the last 72 hours.  Invalid input(s): FREET3 Anemia work up No results for input(s): VITAMINB12, FOLATE, FERRITIN, TIBC, IRON, RETICCTPCT in the last 72 hours. Urinalysis    Component Value Date/Time   COLORURINE YELLOW 03/14/2018 2147   APPEARANCEUR CLEAR 03/14/2018 2147   LABSPEC 1.012 03/14/2018 2147   PHURINE 8.0 03/14/2018 2147   GLUCOSEU NEGATIVE 03/14/2018 2147   GLUCOSEU NEGATIVE 02/16/2010 1117   HGBUR NEGATIVE 03/14/2018 2147   BILIRUBINUR NEGATIVE 03/14/2018 2147   BILIRUBINUR negative 10/05/2015 1530   BILIRUBINUR neg 09/02/2014 1213   [place] 03/14/2018 2147   PROTEINUR NEGATIVE 03/14/2018 2147   UROBILINOGEN 0.2 10/05/2015 1530   UROBILINOGEN 1.0 03/19/2014 1910   NITRITE NEGATIVE 03/14/2018 2147   LEUKOCYTESUR TRACE (A) 03/14/2018 2147   Sepsis Labs Invalid input(s):  PROCALCITONIN,  WBC,  LACTICIDVEN Microbiology Recent Results (from the past 240 hour(s))  Urine culture     Status: None   Collection Time: 03/14/18 11:50 PM  Result Value Ref Range Status   Specimen Description   Final    URINE, RANDOM Performed at [facility] [address], [place], [place] [address]    Special Requests   Final    NONE Performed at [facility], [place] [address], [place], [place] [address]    Culture   Final    NO GROWTH Performed at [facility], [place] [address] [place], [place] [address]    Report Status 03/16/2018 FINAL  Final  Culture, blood (x 2)     Status: None   Collection Time: 03/15/18  1:36 AM  Result Value Ref Range Status   Specimen Description BLOOD RIGHT HAND  Final   Special Requests   Final    BOTTLES  DRAWN AEROBIC AND ANAEROBIC Blood Culture adequate volume   Culture   Final    NO GROWTH 5 DAYS Performed at [facility], [place] [address] [place], [place] [address]    Report Status 03/20/2018 FINAL  Final  Culture, blood (x 2)     Status: None   Collection Time: 03/15/18  1:36 AM  Result Value Ref Range Status   Specimen Description BLOOD LEFT HAND  Final   Special Requests   Final    BOTTLES DRAWN AEROBIC AND ANAEROBIC Blood Culture adequate volume   Culture   Final    NO GROWTH 5 DAYS Performed at [facility], [place] [address], [place], [place] [address]    Report Status 03/20/2018 FINAL  Final  C difficile quick scan w PCR reflex     Status: None   Collection Time: 03/17/18 10:37 AM  Result Value Ref Range Status   C Diff antigen NEGATIVE NEGATIVE Final   C Diff toxin NEGATIVE NEGATIVE Final   C Diff interpretation No C. difficile detected.  Final    Comment: Performed at [facility], [place] [address], [place], [place] [address]  Gastrointestinal Panel by PCR , Stool     Status: None   Collection Time: 03/17/18 10:37 AM  Result Value Ref Range Status   Campylobacter species NOT DETECTED NOT DETECTED Final   Plesimonas shigelloides NOT DETECTED NOT DETECTED Final   Salmonella species NOT DETECTED NOT DETECTED Final   Yersinia enterocolitica NOT DETECTED NOT DETECTED Final   Vibrio species NOT DETECTED NOT DETECTED Final   Vibrio cholerae NOT DETECTED NOT DETECTED Final   Enteroaggregative E coli (EAEC) NOT DETECTED NOT DETECTED Final   Enteropathogenic E coli (EPEC) NOT DETECTED NOT DETECTED Final   Enterotoxigenic E coli (ETEC) NOT DETECTED NOT DETECTED Final   Shiga like toxin producing E coli (STEC) NOT DETECTED NOT DETECTED Final   Shigella/Enteroinvasive E coli (EIEC) NOT DETECTED NOT DETECTED Final   Cryptosporidium NOT DETECTED NOT DETECTED Final   Cyclospora cayetanensis NOT DETECTED NOT DETECTED Final   Entamoeba histolytica NOT  DETECTED NOT DETECTED Final   Giardia lamblia NOT DETECTED NOT DETECTED Final   Adenovirus F40/41 NOT DETECTED NOT DETECTED Final   Astrovirus NOT DETECTED NOT DETECTED Final   Norovirus GI/GII NOT DETECTED NOT DETECTED Final   Rotavirus A NOT DETECTED NOT DETECTED Final   Sapovirus (I, II, IV, and V) NOT DETECTED NOT DETECTED Final    Comment: Performed at [facility], [place]  [address] [place], [place] [address]    SIGNED:   Cordelia Poche, MD [facility] 03/21/2018, 1:03 PM

## 2018-03-21 NOTE — Discharge Instructions (Signed)
Diverticulitis Diverticulitis is when small pockets in your large intestine (colon) get infected or swollen. This causes stomach pain and watery poop (diarrhea). These pouches are called diverticula. They form in people who have a condition called diverticulosis. Follow these instructions at home: Medicines  Take over-the-counter and prescription medicines only as told by your doctor. These include: ? Antibiotics. ? Pain medicines. ? Fiber pills. ? Probiotics. ? Stool softeners.  Do not drive or use heavy machinery while taking prescription pain medicine.  If you were prescribed an antibiotic, take it as told. Do not stop taking it even if you feel better. General instructions  Follow a diet as told by your doctor.  When you feel better, your doctor may tell you to change your diet. You may need to eat a lot of fiber. Fiber makes it easier to poop (have bowel movements). Healthy foods with fiber include: ? Berries. ? Beans. ? Lentils. ? Green vegetables.  Exercise 3 or more times a week. Aim for 30 minutes each time. Exercise enough to sweat and make your heart beat faster.  Keep all follow-up visits as told. This is important. You may need to have an exam of the large intestine. This is called a colonoscopy. Contact a doctor if:  Your pain does not get better.  You have a hard time eating or drinking.  You are not pooping like normal. Get help right away if:  Your pain gets worse.  Your problems do not get better.  Your problems get worse very fast.  You have a fever.  You throw up (vomit) more than one time.  You have poop that is: ? Bloody. ? Black. ? Tarry. Summary  Diverticulitis is when small pockets in your large intestine (colon) get infected or swollen.  Take medicines only as told by your doctor.  Follow a diet as told by your doctor. This information is not intended to replace advice given to you by your health care provider. Make sure you discuss  any questions you have with your health care provider. Document Released: 12/05/2007 Document Revised: 07/05/2016 Document Reviewed: 07/05/2016 Elsevier Interactive Patient Education  2017 [place].  Low-Fiber Diet, 4-6 weeks then switch to high fiber diet Fiber is found in fruits, vegetables, and whole grains. A low-fiber diet restricts fibrous foods that are not digested in the small intestine. A diet containing about 10-15 grams of fiber per day is considered low fiber. Low-fiber diets may be used to:  Promote healing and rest the bowel during intestinal flare-ups.  Prevent blockage of a partially obstructed or narrowed gastrointestinal tract.  Reduce fecal weight and volume.  Slow the movement of feces.  You may be on a low-fiber diet as a transitional diet following surgery, after an injury (trauma), or because of a short (acute) or lifelong (chronic) illness. Your health care provider will determine the length of time you need to stay on this diet. What do I need to know about a low-fiber diet? Always check the fiber content on the packaging's Nutrition Facts label, especially on foods from the grains list. Ask your dietitian if you have questions about specific foods that are related to your condition, especially if the food is not listed below. In general, a low-fiber food will have less than 2 g of fiber. What foods can I eat? Grains All breads and crackers made with white flour. Sweet rolls, doughnuts, waffles, pancakes, [place] toast, bagels. Pretzels, Melba toast, zwieback. Well-cooked cereals, such as cornmeal, farina, or  cream cereals. Dry cereals that do not contain whole grains, fruit, or nuts, such as refined corn, wheat, rice, and oat cereals. Potatoes prepared any way without skins, plain pastas and noodles, refined white rice. Use white flour for baking and making sauces. Use allowed list of grains for casseroles, dumplings, and puddings. Vegetables Strained tomato and  vegetable juices. Fresh lettuce, cucumber, spinach. Well-cooked (no skin or pulp) or canned vegetables, such as asparagus, bean sprouts, beets, carrots, green beans, mushrooms, potatoes, pumpkin, spinach, yellow squash, tomato sauce/puree, turnips, yams, and zucchini. Keep servings limited to  cup. Fruits All fruit juices except prune juice. Cooked or canned fruits without skin and seeds, such as applesauce, apricots, cherries, fruit cocktail, grapefruit, grapes, mandarin oranges, melons, peaches, pears, pineapple, and plums. Fresh fruits without skin, such as apricots, avocados, bananas, melons, pineapple, nectarines, and peaches. Keep servings limited to  cup or 1 piece. Meat and Other Protein Sources Ground or well-cooked tender beef, ham, veal, lamb, pork, or poultry. Eggs, plain cheese. Fish, oysters, shrimp, lobster, and other seafood. Liver, organ meats. Smooth nut butters. Dairy All milk products and alternative dairy substitutes, such as soy, rice, almond, and coconut, not containing added whole nuts, seeds, or added fruit. Beverages Decaf coffee, fruit, and vegetable juices or smoothies (small amounts, with no pulp or skins, and with fruits from allowed list), sports drinks, herbal tea. Condiments Ketchup, mustard, vinegar, cream sauce, cheese sauce, cocoa powder. Spices in moderation, such as allspice, basil, bay leaves, celery powder or leaves, cinnamon, cumin powder, curry powder, ginger, mace, marjoram, onion or garlic powder, oregano, paprika, parsley flakes, ground pepper, rosemary, sage, savory, tarragon, thyme, and turmeric. Sweets and Desserts Plain cakes and cookies, pie made with allowed fruit, pudding, custard, cream pie. Gelatin, fruit, ice, sherbet, frozen ice pops. Ice cream, ice milk without nuts. Plain hard candy, honey, jelly, molasses, syrup, sugar, chocolate syrup, gumdrops, marshmallows. Limit overall sugar intake. Fats and Oil Margarine, butter, cream, mayonnaise,  salad oils, plain salad dressings made from allowed foods. Choose healthy fats such as olive oil, canola oil, and omega-3 fatty acids (such as found in salmon or tuna) when possible. Other Bouillon, broth, or cream soups made from allowed foods. Any strained soup. Casseroles or mixed dishes made with allowed foods. The items listed above may not be a complete list of recommended foods or beverages. Contact your dietitian for more options. What foods are not recommended? Grains All whole wheat and whole grain breads and crackers. Multigrains, rye, bran seeds, nuts, or coconut. Cereals containing whole grains, multigrains, bran, coconut, nuts, raisins. Cooked or dry oatmeal, steel-cut oats. Coarse wheat cereals, granola. Cereals advertised as high fiber. Potato skins. Whole grain pasta, wild or brown rice. Popcorn. Coconut flour. Bran, buckwheat, corn bread, multigrains, rye, wheat germ. Vegetables Fresh, cooked or canned vegetables, such as artichokes, asparagus, beet greens, broccoli, Brussels sprouts, cabbage, celery, cauliflower, corn, eggplant, kale, legumes or beans, okra, peas, and tomatoes. Avoid large servings of any vegetables, especially raw vegetables. Fruits Fresh fruits, such as apples with or without skin, berries, cherries, figs, grapes, grapefruit, guavas, kiwis, mangoes, oranges, papayas, pears, persimmons, pineapple, and pomegranate. Prune juice and juices with pulp, stewed or dried prunes. Dried fruits, dates, raisins. Fruit seeds or skins. Avoid large servings of all fresh fruits. Meats and Other Protein Sources Tough, fibrous meats with gristle. Chunky nut butter. Cheese made with seeds, nuts, or other foods not recommended. Nuts, seeds, legumes (beans, including baked beans), dried peas, beans, lentils. Dairy Yogurt or  cheese that contains nuts, seeds, or added fruit. Beverages Fruit juices with high pulp, prune juice. Caffeinated coffee and teas. Condiments Coconut, maple  syrup, pickles, olives. Sweets and Desserts Desserts, cookies, or candies that contain nuts or coconut, chunky peanut butter, dried fruits. Jams, preserves with seeds, marmalade. Large amounts of sugar and sweets. Any other dessert made with fruits from the not recommended list. Other Soups made from vegetables that are not recommended or that contain other foods not recommended. The items listed above may not be a complete list of foods and beverages to avoid. Contact your dietitian for more information. This information is not intended to replace advice given to you by your health care provider. Make sure you discuss any questions you have with your health care provider. Document Released: 12/08/2001 Document Revised: 11/24/2015 Document Reviewed: 05/11/2013 Elsevier Interactive Patient Education  2017 [place].   High-Fiber Diet Fiber, also called dietary fiber, is a type of carbohydrate found in fruits, vegetables, whole grains, and beans. A high-fiber diet can have many health benefits. Your health care provider may recommend a high-fiber diet to help:  Prevent constipation. Fiber can make your bowel movements more regular.  Lower your cholesterol.  Relieve hemorrhoids, uncomplicated diverticulosis, or irritable bowel syndrome.  Prevent overeating as part of a weight-loss plan.  Prevent heart disease, type 2 diabetes, and certain cancers.  What is my plan? The recommended daily intake of fiber includes:  38 grams for men under age 43.  57 grams for men over age 55.  24 grams for women under age 68.  22 grams for women over age 40.  You can get the recommended daily intake of dietary fiber by eating a variety of fruits, vegetables, grains, and beans. Your health care provider may also recommend a fiber supplement if it is not possible to get enough fiber through your diet. What do I need to know about a high-fiber diet?  Fiber supplements have not been widely studied  for their effectiveness, so it is better to get fiber through food sources.  Always check the fiber content on thenutrition facts label of any prepackaged food. Look for foods that contain at least 5 grams of fiber per serving.  Ask your dietitian if you have questions about specific foods that are related to your condition, especially if those foods are not listed in the following section.  Increase your daily fiber consumption gradually. Increasing your intake of dietary fiber too quickly may cause bloating, cramping, or gas.  Drink plenty of water. Water helps you to digest fiber. What foods can I eat? Grains Whole-grain breads. Multigrain cereal. Oats and oatmeal. Brown rice. Barley. Bulgur wheat. [place]. Bran muffins. Popcorn. Rye wafer crackers. Vegetables Sweet potatoes. Spinach. Kale. Artichokes. Cabbage. Broccoli. Green peas. Carrots. Squash. Fruits Berries. Pears. Apples. Oranges. Avocados. Prunes and raisins. Dried figs. Meats and Other Protein Sources [place], kidney, pinto, and soy beans. Split peas. Lentils. Nuts and seeds. Dairy Fiber-fortified yogurt. Beverages Fiber-fortified soy milk. Fiber-fortified orange juice. Other Fiber bars. The items listed above may not be a complete list of recommended foods or beverages. Contact your dietitian for more options. What foods are not recommended? Grains White bread. Pasta made with refined flour. White rice. Vegetables Fried potatoes. Canned vegetables. Well-cooked vegetables. Fruits Fruit juice. Cooked, strained fruit. Meats and Other Protein Sources Fatty cuts of meat. Fried Sales executive or fried fish. Dairy Milk. Yogurt. Cream cheese. Sour cream. Beverages Soft drinks. Other Cakes and pastries. Butter and oils. The items  listed above may not be a complete list of foods and beverages to avoid. Contact your dietitian for more information. What are some tips for including high-fiber foods in my diet?  Eat a wide variety of  high-fiber foods.  Make sure that half of all grains consumed each day are whole grains.  Replace breads and cereals made from refined flour or white flour with whole-grain breads and cereals.  Replace white rice with brown rice, bulgur wheat, or millet.  Start the day with a breakfast that is high in fiber, such as a cereal that contains at least 5 grams of fiber per serving.  Use beans in place of meat in soups, salads, or pasta.  Eat high-fiber snacks, such as berries, raw vegetables, nuts, or popcorn. This information is not intended to replace advice given to you by your health care provider. Make sure you discuss any questions you have with your health care provider. Document Released: 06/18/2005 Document Revised: 11/24/2015 Document Reviewed: 12/01/2013 Elsevier Interactive Patient Education  [employer].

## 2018-03-25 DIAGNOSIS — F4310 Post-traumatic stress disorder, unspecified: Secondary | ICD-10-CM | POA: Diagnosis not present

## 2018-03-26 DIAGNOSIS — K5780 Diverticulitis of intestine, part unspecified, with perforation and abscess without bleeding: Secondary | ICD-10-CM | POA: Diagnosis not present

## 2018-03-27 DIAGNOSIS — F4310 Post-traumatic stress disorder, unspecified: Secondary | ICD-10-CM | POA: Diagnosis not present

## 2018-04-01 DIAGNOSIS — K5780 Diverticulitis of intestine, part unspecified, with perforation and abscess without bleeding: Secondary | ICD-10-CM | POA: Diagnosis not present

## 2018-04-08 DIAGNOSIS — F4310 Post-traumatic stress disorder, unspecified: Secondary | ICD-10-CM | POA: Diagnosis not present

## 2018-04-10 DIAGNOSIS — F4310 Post-traumatic stress disorder, unspecified: Secondary | ICD-10-CM | POA: Diagnosis not present

## 2018-04-15 DIAGNOSIS — F4310 Post-traumatic stress disorder, unspecified: Secondary | ICD-10-CM | POA: Diagnosis not present

## 2018-04-17 DIAGNOSIS — F4310 Post-traumatic stress disorder, unspecified: Secondary | ICD-10-CM | POA: Diagnosis not present

## 2018-04-21 DIAGNOSIS — K5780 Diverticulitis of intestine, part unspecified, with perforation and abscess without bleeding: Secondary | ICD-10-CM | POA: Diagnosis not present

## 2018-04-23 DIAGNOSIS — R05 Cough: Secondary | ICD-10-CM | POA: Diagnosis not present

## 2018-04-23 DIAGNOSIS — R509 Fever, unspecified: Secondary | ICD-10-CM | POA: Diagnosis not present

## 2018-04-29 DIAGNOSIS — F4310 Post-traumatic stress disorder, unspecified: Secondary | ICD-10-CM | POA: Diagnosis not present

## 2018-05-05 ENCOUNTER — Ambulatory Visit (INDEPENDENT_AMBULATORY_CARE_PROVIDER_SITE_OTHER)
Admission: RE | Admit: 2018-05-05 | Discharge: 2018-05-05 | Disposition: A | Discharge status: Home / Self Care | Payer: Medicare Other | Source: Ambulatory Visit | Attending: Internal Medicine | Admitting: Internal Medicine

## 2018-05-05 ENCOUNTER — Ambulatory Visit (INDEPENDENT_AMBULATORY_CARE_PROVIDER_SITE_OTHER): Payer: Medicare Other | Admitting: Internal Medicine

## 2018-05-05 ENCOUNTER — Encounter: Payer: Self-pay | Admitting: Internal Medicine

## 2018-05-05 VITALS — BP 110/76 | HR 96 | Ht 61.75 in | Wt 187.0 lb

## 2018-05-05 DIAGNOSIS — R109 Unspecified abdominal pain: Secondary | ICD-10-CM

## 2018-05-05 DIAGNOSIS — R935 Abnormal findings on diagnostic imaging of other abdominal regions, including retroperitoneum: Secondary | ICD-10-CM | POA: Diagnosis not present

## 2018-05-05 DIAGNOSIS — K5732 Diverticulitis of large intestine without perforation or abscess without bleeding: Secondary | ICD-10-CM | POA: Diagnosis not present

## 2018-05-05 MED ORDER — METRONIDAZOLE 500 MG PO TABS: 500.0000 mg | ORAL_TABLET | Freq: Two times a day (BID) | ORAL | 0 refills | Status: DC

## 2018-05-05 MED ORDER — CIPROFLOXACIN HCL 500 MG PO TABS: 500.0000 mg | ORAL_TABLET | Freq: Two times a day (BID) | ORAL | 0 refills | Status: DC

## 2018-05-05 NOTE — Patient Instructions (Signed)
We have sent the following medications to your pharmacy for you to pick up at your convenience:  Flagyl, Cipro  You have been scheduled for a CT scan of the abdomen and pelvis at [facility] ([address]---this is in the same building as [facility]).   You are scheduled on 05/05/2018 at 11:45am. You should arrive 15 minutes prior to your appointment time for registration. Please follow the written instructions below on the day of your exam:  WARNING: IF YOU ARE ALLERGIC TO IODINE/X-RAY DYE, PLEASE NOTIFY RADIOLOGY IMMEDIATELY AT [phone]! YOU WILL BE GIVEN A 13 HOUR PREMEDICATION PREP.  1) Do not eat anything after 7:30am (4 hours prior to your test) 2) You have been given 2 bottles of oral contrast to drink. The solution may taste better if refrigerated, but do NOT add ice or any other liquid to this solution. Shake well before drinking.    Drink 1 bottle of contrast @ 10:00am (2 hours prior to your exam)  Drink 1 bottle of contrast @ 11:00am (1 hour prior to your exam)  You may take any medications as prescribed with a small amount of water, if necessary. If you take any of the following medications: METFORMIN, GLUCOPHAGE, GLUCOVANCE, AVANDAMET, RIOMET, FORTAMET, [place] MET, JANUMET, GLUMETZA or METAGLIP, you MAY be asked to HOLD this medication 48 hours AFTER the exam.  The purpose of you drinking the oral contrast is to aid in the visualization of your intestinal tract. The contrast solution may cause some diarrhea. Depending on your individual set of symptoms, you may also receive an intravenous injection of x-ray contrast/dye. Plan on being at [facility] for 30 minutes or longer, depending on the type of exam you are having performed.  This test typically takes 30-45 minutes to complete.  If you have any questions regarding your exam or if you need to reschedule, you may call the CT department at [phone] between the hours of 8:00 am and 5:00 pm,  Monday-Friday.  ________________________________________________________________________

## 2018-05-05 NOTE — Progress Notes (Signed)
HISTORY OF PRESENT ILLNESS:  Tamara [name] is a 57 y.o. female with a history of GERD, functional dyspepsia, and obesity.  She presents today for follow-up regarding abdominal pain.  She was hospitalized March 14, 2018 with acute lower abdominal pain in the suprapubic region right of center.  Review of blood work shows mild leukocytosis.  Review of x-rays included a CT scan of the abdomen and pelvis with oral contrast revealing acute sigmoid diverticulitis with probable contained perforation.  She was treated with bowel rest and antibiotics.  She was hospitalized for 1 week.  Discharged home on ciprofloxacin and Flagyl for 1 week.  Was doing well until 4 days ago when she began to notice recurrent lower abdominal discomfort, though less severe.  No fevers.  She has been on a modified diet.  She does have outpatient surgical follow-up with Dr. Johnathan Hausen.  Her last complete colonoscopy was performed August 2018.  She was found to have pandiverticulosis with sigmoid stenosis and internal hemorrhoids.  Her GI review of systems today is otherwise negative.  For her reflux she is on Dexilant.  REVIEW OF SYSTEMS:  All non-GI ROS negative unless otherwise stated in the HPI except for fatigue, depression, anxiety, back pain, cough, sinus and allergy troubles, sleeping problems, shortness of breath  Past Medical History:  Diagnosis Date  . Allergy   . Anemia   . Anxiety   . Asthma   . Depression   . Diverticulitis   . GERD (gastroesophageal reflux disease)   . History of short term memory loss   . HTN (hypertension)   . Hyperlipemia   . Neuromuscular disorder ([place])   . PTSD (post-traumatic stress disorder)   . Shock therapy as cause of abnormal reaction of patient or of later complication without mention of misadventure at time of procedure     Past Surgical History:  Procedure Laterality Date  . ABDOMINAL HYSTERECTOMY     partial  . CHOLECYSTECTOMY    . COLONOSCOPY    . OTHER  SURGICAL HISTORY     electric shock therapy  . TUBAL LIGATION    . UPPER GASTROINTESTINAL ENDOSCOPY      Social History Kearstin Learn  reports that she has never smoked. She has never used smokeless tobacco. She reports that she does not drink alcohol or use drugs.  family history includes Arrhythmia in her father; COPD in her father; Cancer in her mother; Colon cancer in her paternal uncle; Coronary artery disease (age of onset: 62) in her maternal grandfather; Diabetes in her daughter and paternal grandfather; Hearing loss in her paternal grandfather and paternal grandmother; Heart disease in her father and maternal grandfather; Hyperlipidemia in her brother, father, maternal grandmother, and mother; Hypertension in her father, maternal grandfather, and mother; Stroke in her father.  Allergies  Allergen Reactions  . Benadryl [Diphenhydramine] Anaphylaxis  . Darvon [Propoxyphene] Other (See Comments)    Hallucinations   . Dilaudid [Hydromorphone Hcl]     HALLUCINATIONS  . Gabapentin     Unknown reaction, patient cannot remember   . Ivp Dye [Iodinated Diagnostic Agents] Anaphylaxis and Swelling  . Levaquin [Levofloxacin] Nausea And Vomiting    Patient states she tolerates Cipro  . Morphine And Related Itching  . Penicillins Rash    Has patient had a PCN reaction causing immediate rash, facial/tongue/throat swelling, SOB or lightheadedness with hypotension: yes Has patient had a PCN reaction causing severe rash involving mucus membranes or skin necrosis: yes Has patient had a  PCN reaction that required hospitalization: No Has patient had a PCN reaction occurring within the last 10 years: yes If all of the above answers are "NO", then may proceed with Cephalosporin use.   Marland Kitchen Propoxyphene Hcl Other (See Comments)    hallucination  . Rocephin [Ceftriaxone] Rash    Welts; Pt said she tolerates keflex  . Seroquel [Quetiapine Fumerate]     Rectal bleeding, abdominal cramping  .  Statins Other (See Comments)    Severe mm pain (esp. Arms) >> DOES NOT WANT TO RETRY!  . Sulfonamide Derivatives Rash    Welts   . Adhesive [Tape]     This is tape as well as adhesive on bandaids.  . Dicyclomine     Vision loss in one eye  . Other     All Anti-Histamines  . Oxycodone Other (See Comments)    amnesia  . Oxycontin [Oxycodone Hcl]     Amnesia       PHYSICAL EXAMINATION: Vital signs: BP 110/76 (BP Location: Left Arm, Patient Position: Sitting, Cuff Size: Normal)   Pulse 96   Ht 5' 1.75" (1.568 m) Comment: height measured without shoes  Wt 187 lb (84.8 kg)   BMI 34.48 kg/m   Constitutional: generally well-appearing, no acute distress Psychiatric: alert and oriented x3, cooperative Eyes: extraocular movements intact, anicteric, conjunctiva pink Mouth: oral pharynx moist, no lesions Neck: supple no lymphadenopathy Cardiovascular: heart regular rate and rhythm, no murmur Lungs: clear to auscultation bilaterally Abdomen: soft, mild tenderness without rebound in the right suprapubic region, nondistended, no obvious ascites, no peritoneal signs, normal bowel sounds, no organomegaly Rectal: Omitted Extremities: no clubbing, cyanosis, or lower extremity edema bilaterally Skin: no lesions on visible extremities Neuro: No focal deficits.  Cranial nerves intact    ASSESSMENT:  #1.  Recent hospitalization for acute diverticulitis with contained perforation.  Was doing well until recent days when she has developed recurrent but less severe discomfort.  Rule out recurrent diverticulitis #2.  GERD #3.  Functional dyspepsia #4.  Obesity   PLAN:  1.  Prescribe ciprofloxacin 500 mg twice daily for 10 days 2.  Prescribe metronidazole 500 mg twice daily for 10 days 3.  CT scan with oral contrast, of the abdomen pelvis, ASAP 4.  Surgical follow-up with Dr. Hassell Done as planned

## 2018-05-06 DIAGNOSIS — F4310 Post-traumatic stress disorder, unspecified: Secondary | ICD-10-CM | POA: Diagnosis not present

## 2018-05-08 DIAGNOSIS — F4310 Post-traumatic stress disorder, unspecified: Secondary | ICD-10-CM | POA: Diagnosis not present

## 2018-05-13 DIAGNOSIS — F4310 Post-traumatic stress disorder, unspecified: Secondary | ICD-10-CM | POA: Diagnosis not present

## 2018-05-15 DIAGNOSIS — F4310 Post-traumatic stress disorder, unspecified: Secondary | ICD-10-CM | POA: Diagnosis not present

## 2018-05-16 DIAGNOSIS — F329 Major depressive disorder, single episode, unspecified: Secondary | ICD-10-CM | POA: Diagnosis not present

## 2018-05-20 DIAGNOSIS — M9902 Segmental and somatic dysfunction of thoracic region: Secondary | ICD-10-CM | POA: Diagnosis not present

## 2018-05-20 DIAGNOSIS — M5414 Radiculopathy, thoracic region: Secondary | ICD-10-CM | POA: Diagnosis not present

## 2018-05-20 DIAGNOSIS — M9903 Segmental and somatic dysfunction of lumbar region: Secondary | ICD-10-CM | POA: Diagnosis not present

## 2018-05-20 DIAGNOSIS — M5032 Other cervical disc degeneration, mid-cervical region, unspecified level: Secondary | ICD-10-CM | POA: Diagnosis not present

## 2018-05-20 DIAGNOSIS — M5416 Radiculopathy, lumbar region: Secondary | ICD-10-CM | POA: Diagnosis not present

## 2018-05-20 DIAGNOSIS — M9901 Segmental and somatic dysfunction of cervical region: Secondary | ICD-10-CM | POA: Diagnosis not present

## 2018-05-20 DIAGNOSIS — F4310 Post-traumatic stress disorder, unspecified: Secondary | ICD-10-CM | POA: Diagnosis not present

## 2018-05-22 DIAGNOSIS — M9901 Segmental and somatic dysfunction of cervical region: Secondary | ICD-10-CM | POA: Diagnosis not present

## 2018-05-22 DIAGNOSIS — M9903 Segmental and somatic dysfunction of lumbar region: Secondary | ICD-10-CM | POA: Diagnosis not present

## 2018-05-22 DIAGNOSIS — M5032 Other cervical disc degeneration, mid-cervical region, unspecified level: Secondary | ICD-10-CM | POA: Diagnosis not present

## 2018-05-22 DIAGNOSIS — M5414 Radiculopathy, thoracic region: Secondary | ICD-10-CM | POA: Diagnosis not present

## 2018-05-22 DIAGNOSIS — M9902 Segmental and somatic dysfunction of thoracic region: Secondary | ICD-10-CM | POA: Diagnosis not present

## 2018-05-22 DIAGNOSIS — F4310 Post-traumatic stress disorder, unspecified: Secondary | ICD-10-CM | POA: Diagnosis not present

## 2018-05-22 DIAGNOSIS — M5416 Radiculopathy, lumbar region: Secondary | ICD-10-CM | POA: Diagnosis not present

## 2018-05-23 DIAGNOSIS — R05 Cough: Secondary | ICD-10-CM | POA: Diagnosis not present

## 2018-05-23 DIAGNOSIS — Z23 Encounter for immunization: Secondary | ICD-10-CM | POA: Diagnosis not present

## 2018-05-26 DIAGNOSIS — M9903 Segmental and somatic dysfunction of lumbar region: Secondary | ICD-10-CM | POA: Diagnosis not present

## 2018-05-26 DIAGNOSIS — M9901 Segmental and somatic dysfunction of cervical region: Secondary | ICD-10-CM | POA: Diagnosis not present

## 2018-05-26 DIAGNOSIS — M5032 Other cervical disc degeneration, mid-cervical region, unspecified level: Secondary | ICD-10-CM | POA: Diagnosis not present

## 2018-05-26 DIAGNOSIS — M9902 Segmental and somatic dysfunction of thoracic region: Secondary | ICD-10-CM | POA: Diagnosis not present

## 2018-05-26 DIAGNOSIS — M5416 Radiculopathy, lumbar region: Secondary | ICD-10-CM | POA: Diagnosis not present

## 2018-05-26 DIAGNOSIS — M5414 Radiculopathy, thoracic region: Secondary | ICD-10-CM | POA: Diagnosis not present

## 2018-05-27 DIAGNOSIS — F4310 Post-traumatic stress disorder, unspecified: Secondary | ICD-10-CM | POA: Diagnosis not present

## 2018-05-27 DIAGNOSIS — K5780 Diverticulitis of intestine, part unspecified, with perforation and abscess without bleeding: Secondary | ICD-10-CM | POA: Diagnosis not present

## 2018-05-28 DIAGNOSIS — M9901 Segmental and somatic dysfunction of cervical region: Secondary | ICD-10-CM | POA: Diagnosis not present

## 2018-05-28 DIAGNOSIS — M9902 Segmental and somatic dysfunction of thoracic region: Secondary | ICD-10-CM | POA: Diagnosis not present

## 2018-05-28 DIAGNOSIS — M5416 Radiculopathy, lumbar region: Secondary | ICD-10-CM | POA: Diagnosis not present

## 2018-05-28 DIAGNOSIS — M9903 Segmental and somatic dysfunction of lumbar region: Secondary | ICD-10-CM | POA: Diagnosis not present

## 2018-05-28 DIAGNOSIS — M5032 Other cervical disc degeneration, mid-cervical region, unspecified level: Secondary | ICD-10-CM | POA: Diagnosis not present

## 2018-05-28 DIAGNOSIS — M5414 Radiculopathy, thoracic region: Secondary | ICD-10-CM | POA: Diagnosis not present

## 2018-06-03 DIAGNOSIS — F4310 Post-traumatic stress disorder, unspecified: Secondary | ICD-10-CM | POA: Diagnosis not present

## 2018-06-05 DIAGNOSIS — F4310 Post-traumatic stress disorder, unspecified: Secondary | ICD-10-CM | POA: Diagnosis not present

## 2018-06-10 DIAGNOSIS — F4310 Post-traumatic stress disorder, unspecified: Secondary | ICD-10-CM | POA: Diagnosis not present

## 2018-06-12 DIAGNOSIS — F4310 Post-traumatic stress disorder, unspecified: Secondary | ICD-10-CM | POA: Diagnosis not present

## 2018-06-17 ENCOUNTER — Telehealth: Payer: Self-pay | Admitting: Internal Medicine

## 2018-06-17 DIAGNOSIS — F4310 Post-traumatic stress disorder, unspecified: Secondary | ICD-10-CM | POA: Diagnosis not present

## 2018-06-17 NOTE — Telephone Encounter (Signed)
Patient changed pharmacy from [facility] to [facility] select the one that is in [place] not [place]. ID# [id number] phone number [phone]  Pt is needing refill on ranitidine

## 2018-06-18 MED ORDER — RANITIDINE HCL 150 MG PO TABS: 150.0000 mg | ORAL_TABLET | Freq: Every day | ORAL | 3 refills | Status: DC

## 2018-06-18 NOTE — Telephone Encounter (Signed)
Confirmed the correct [facility] and sent Ranitidine rx electronically

## 2018-06-19 DIAGNOSIS — F4310 Post-traumatic stress disorder, unspecified: Secondary | ICD-10-CM | POA: Diagnosis not present

## 2018-06-24 DIAGNOSIS — F4310 Post-traumatic stress disorder, unspecified: Secondary | ICD-10-CM | POA: Diagnosis not present

## 2018-07-01 DIAGNOSIS — K5780 Diverticulitis of intestine, part unspecified, with perforation and abscess without bleeding: Secondary | ICD-10-CM | POA: Diagnosis not present

## 2018-07-08 DIAGNOSIS — F4310 Post-traumatic stress disorder, unspecified: Secondary | ICD-10-CM | POA: Diagnosis not present

## 2018-07-10 DIAGNOSIS — F4310 Post-traumatic stress disorder, unspecified: Secondary | ICD-10-CM | POA: Diagnosis not present

## 2018-07-12 ENCOUNTER — Inpatient Hospital Stay (HOSPITAL_COMMUNITY)
Admission: EM | Admit: 2018-07-12 | Discharge: 2018-07-17 | DRG: 392 | Disposition: A | Discharge status: Home / Self Care | Payer: Medicare Other | Attending: Family Medicine | Admitting: Family Medicine

## 2018-07-12 ENCOUNTER — Emergency Department (HOSPITAL_COMMUNITY): Payer: Medicare Other

## 2018-07-12 ENCOUNTER — Encounter (HOSPITAL_COMMUNITY): Payer: Self-pay | Admitting: Emergency Medicine

## 2018-07-12 ENCOUNTER — Inpatient Hospital Stay (HOSPITAL_COMMUNITY): Payer: Medicare Other

## 2018-07-12 DIAGNOSIS — Z8349 Family history of other endocrine, nutritional and metabolic diseases: Secondary | ICD-10-CM | POA: Diagnosis not present

## 2018-07-12 DIAGNOSIS — F4310 Post-traumatic stress disorder, unspecified: Secondary | ICD-10-CM | POA: Diagnosis not present

## 2018-07-12 DIAGNOSIS — R1030 Lower abdominal pain, unspecified: Secondary | ICD-10-CM | POA: Diagnosis not present

## 2018-07-12 DIAGNOSIS — Z833 Family history of diabetes mellitus: Secondary | ICD-10-CM | POA: Diagnosis not present

## 2018-07-12 DIAGNOSIS — Z6834 Body mass index (BMI) 34.0-34.9, adult: Secondary | ICD-10-CM

## 2018-07-12 DIAGNOSIS — L0291 Cutaneous abscess, unspecified: Secondary | ICD-10-CM

## 2018-07-12 DIAGNOSIS — B9620 Unspecified Escherichia coli [E. coli] as the cause of diseases classified elsewhere: Secondary | ICD-10-CM | POA: Diagnosis not present

## 2018-07-12 DIAGNOSIS — F329 Major depressive disorder, single episode, unspecified: Secondary | ICD-10-CM | POA: Diagnosis not present

## 2018-07-12 DIAGNOSIS — R1084 Generalized abdominal pain: Secondary | ICD-10-CM | POA: Diagnosis not present

## 2018-07-12 DIAGNOSIS — Z1623 Resistance to quinolones and fluoroquinolones: Secondary | ICD-10-CM | POA: Diagnosis not present

## 2018-07-12 DIAGNOSIS — Z8249 Family history of ischemic heart disease and other diseases of the circulatory system: Secondary | ICD-10-CM

## 2018-07-12 DIAGNOSIS — Z1629 Resistance to other single specified antibiotic: Secondary | ICD-10-CM | POA: Diagnosis not present

## 2018-07-12 DIAGNOSIS — K219 Gastro-esophageal reflux disease without esophagitis: Secondary | ICD-10-CM | POA: Diagnosis not present

## 2018-07-12 DIAGNOSIS — Z9049 Acquired absence of other specified parts of digestive tract: Secondary | ICD-10-CM | POA: Diagnosis not present

## 2018-07-12 DIAGNOSIS — F418 Other specified anxiety disorders: Secondary | ICD-10-CM | POA: Diagnosis present

## 2018-07-12 DIAGNOSIS — K5792 Diverticulitis of intestine, part unspecified, without perforation or abscess without bleeding: Secondary | ICD-10-CM | POA: Diagnosis not present

## 2018-07-12 DIAGNOSIS — I10 Essential (primary) hypertension: Secondary | ICD-10-CM | POA: Diagnosis present

## 2018-07-12 DIAGNOSIS — I700 Atherosclerosis of aorta: Secondary | ICD-10-CM | POA: Diagnosis not present

## 2018-07-12 DIAGNOSIS — K5732 Diverticulitis of large intestine without perforation or abscess without bleeding: Secondary | ICD-10-CM | POA: Diagnosis not present

## 2018-07-12 DIAGNOSIS — N739 Female pelvic inflammatory disease, unspecified: Secondary | ICD-10-CM | POA: Diagnosis present

## 2018-07-12 DIAGNOSIS — Z1611 Resistance to penicillins: Secondary | ICD-10-CM | POA: Diagnosis not present

## 2018-07-12 DIAGNOSIS — E785 Hyperlipidemia, unspecified: Secondary | ICD-10-CM | POA: Diagnosis not present

## 2018-07-12 DIAGNOSIS — Z825 Family history of asthma and other chronic lower respiratory diseases: Secondary | ICD-10-CM

## 2018-07-12 DIAGNOSIS — Z800 Family history of malignant neoplasm of digestive organs: Secondary | ICD-10-CM

## 2018-07-12 DIAGNOSIS — K631 Perforation of intestine (nontraumatic): Secondary | ICD-10-CM

## 2018-07-12 DIAGNOSIS — K760 Fatty (change of) liver, not elsewhere classified: Secondary | ICD-10-CM | POA: Diagnosis present

## 2018-07-12 DIAGNOSIS — Z90710 Acquired absence of both cervix and uterus: Secondary | ICD-10-CM | POA: Diagnosis not present

## 2018-07-12 DIAGNOSIS — K449 Diaphragmatic hernia without obstruction or gangrene: Secondary | ICD-10-CM | POA: Diagnosis present

## 2018-07-12 DIAGNOSIS — I129 Hypertensive chronic kidney disease with stage 1 through stage 4 chronic kidney disease, or unspecified chronic kidney disease: Secondary | ICD-10-CM | POA: Diagnosis not present

## 2018-07-12 DIAGNOSIS — Z792 Long term (current) use of antibiotics: Secondary | ICD-10-CM

## 2018-07-12 DIAGNOSIS — K5720 Diverticulitis of large intestine with perforation and abscess without bleeding: Secondary | ICD-10-CM | POA: Diagnosis not present

## 2018-07-12 DIAGNOSIS — N183 Chronic kidney disease, stage 3 unspecified: Secondary | ICD-10-CM | POA: Diagnosis present

## 2018-07-12 DIAGNOSIS — F419 Anxiety disorder, unspecified: Secondary | ICD-10-CM | POA: Diagnosis not present

## 2018-07-12 DIAGNOSIS — Z7982 Long term (current) use of aspirin: Secondary | ICD-10-CM

## 2018-07-12 DIAGNOSIS — K5780 Diverticulitis of intestine, part unspecified, with perforation and abscess without bleeding: Secondary | ICD-10-CM | POA: Diagnosis not present

## 2018-07-12 DIAGNOSIS — F32A Depression, unspecified: Secondary | ICD-10-CM | POA: Diagnosis present

## 2018-07-12 DIAGNOSIS — Z823 Family history of stroke: Secondary | ICD-10-CM

## 2018-07-12 DIAGNOSIS — E669 Obesity, unspecified: Secondary | ICD-10-CM | POA: Diagnosis not present

## 2018-07-12 DIAGNOSIS — Z79899 Other long term (current) drug therapy: Secondary | ICD-10-CM

## 2018-07-12 LAB — URINALYSIS, ROUTINE W REFLEX MICROSCOPIC
Bilirubin Urine: NEGATIVE
Glucose, UA: NEGATIVE mg/dL
Hgb urine dipstick: NEGATIVE
Ketones, ur: NEGATIVE mg/dL
Leukocytes, UA: NEGATIVE
Nitrite: NEGATIVE
PROTEIN: NEGATIVE mg/dL
Specific Gravity, Urine: 1.01 (ref 1.005–1.030)
pH: 8 (ref 5.0–8.0)

## 2018-07-12 LAB — COMPREHENSIVE METABOLIC PANEL
ALT: 15 U/L (ref 0–44)
AST: 20 U/L (ref 15–41)
Albumin: 4.1 g/dL (ref 3.5–5.0)
Alkaline Phosphatase: 64 U/L (ref 38–126)
Anion gap: 9 (ref 5–15)
BUN: 28 mg/dL — ABNORMAL HIGH (ref 6–20)
CALCIUM: 10.8 mg/dL — AB (ref 8.9–10.3)
CO2: 24 mmol/L (ref 22–32)
Chloride: 103 mmol/L (ref 98–111)
Creatinine, Ser: 1.24 mg/dL — ABNORMAL HIGH (ref 0.44–1.00)
GFR calc Af Amer: 56 mL/min — ABNORMAL LOW (ref >60)
GFR calc non Af Amer: 49 mL/min — ABNORMAL LOW (ref >60)
Glucose, Bld: 101 mg/dL — ABNORMAL HIGH (ref 70–99)
Potassium: 3.7 mmol/L (ref 3.5–5.1)
Sodium: 136 mmol/L (ref 135–145)
TOTAL PROTEIN: 6.8 g/dL (ref 6.5–8.1)
Total Bilirubin: 1 mg/dL (ref 0.3–1.2)

## 2018-07-12 LAB — CBC
HCT: 39.1 % (ref 36.0–46.0)
Hemoglobin: 12.9 g/dL (ref 12.0–15.0)
MCH: 28.5 pg (ref 26.0–34.0)
MCHC: 33 g/dL (ref 30.0–36.0)
MCV: 86.5 fL (ref 80.0–100.0)
PLATELETS: 251 10*3/uL (ref 150–400)
RBC: 4.52 MIL/uL (ref 3.87–5.11)
RDW: 12.6 % (ref 11.5–15.5)
WBC: 9.4 10*3/uL (ref 4.0–10.5)
nRBC: 0 % (ref 0.0–0.2)

## 2018-07-12 LAB — LACTIC ACID, PLASMA
Lactic Acid, Venous: 1.6 mmol/L (ref 0.5–1.9)
Lactic Acid, Venous: 1.3 mmol/L (ref 0.5–1.9)

## 2018-07-12 LAB — LIPASE, BLOOD: Lipase: 56 U/L — ABNORMAL HIGH (ref 11–51)

## 2018-07-12 MED ORDER — ALPRAZOLAM 0.5 MG PO TABS
0.5000 mg | ORAL_TABLET | Freq: Two times a day (BID) | ORAL | Status: DC | PRN
  Administered 2018-07-14 – 2018-07-16 (×2): 0.5 mg via ORAL
  Filled 2018-07-12 (×2): qty 1

## 2018-07-12 MED ORDER — ONDANSETRON HCL 4 MG PO TABS: 4.0000 mg | ORAL_TABLET | Freq: Four times a day (QID) | ORAL | Status: DC | PRN

## 2018-07-12 MED ORDER — KETAMINE HCL 50 MG/5ML IJ SOSY
0.3000 mg/kg | PREFILLED_SYRINGE | Freq: Once | INTRAMUSCULAR | Status: AC
  Administered 2018-07-12: 26 mg via INTRAVENOUS
  Filled 2018-07-12: qty 5

## 2018-07-12 MED ORDER — METRONIDAZOLE IN NACL 5-0.79 MG/ML-% IV SOLN
500.0000 mg | Freq: Three times a day (TID) | INTRAVENOUS | Status: DC
  Administered 2018-07-12 – 2018-07-14 (×5): 500 mg via INTRAVENOUS
  Filled 2018-07-12 (×5): qty 100

## 2018-07-12 MED ORDER — ONDANSETRON 4 MG PO TBDP
4.0000 mg | ORAL_TABLET | Freq: Once | ORAL | Status: DC | PRN
  Filled 2018-07-12: qty 1

## 2018-07-12 MED ORDER — ONDANSETRON HCL 4 MG/2ML IJ SOLN
4.0000 mg | Freq: Four times a day (QID) | INTRAMUSCULAR | Status: DC | PRN
  Administered 2018-07-12: 4 mg via INTRAVENOUS
  Filled 2018-07-12: qty 2

## 2018-07-12 MED ORDER — SODIUM CHLORIDE 0.9% FLUSH
5.0000 mL | Freq: Three times a day (TID) | INTRAVENOUS | Status: DC
  Administered 2018-07-13 – 2018-07-17 (×10): 5 mL

## 2018-07-12 MED ORDER — FENTANYL CITRATE (PF) 100 MCG/2ML IJ SOLN
INTRAMUSCULAR | Status: AC
  Filled 2018-07-12: qty 4

## 2018-07-12 MED ORDER — TEMAZEPAM 15 MG PO CAPS
30.0000 mg | ORAL_CAPSULE | Freq: Every day | ORAL | Status: DC
  Administered 2018-07-12 – 2018-07-16 (×5): 30 mg via ORAL
  Filled 2018-07-12 (×5): qty 2

## 2018-07-12 MED ORDER — METOCLOPRAMIDE HCL 5 MG/ML IJ SOLN
20.0000 mg | Freq: Once | INTRAVENOUS | Status: AC
  Administered 2018-07-12: 20 mg via INTRAVENOUS
  Filled 2018-07-12: qty 4
  Filled 2018-07-12: qty 2

## 2018-07-12 MED ORDER — SODIUM CHLORIDE 0.9 % IV BOLUS
1000.0000 mL | Freq: Once | INTRAVENOUS | Status: AC
  Administered 2018-07-12: 1000 mL via INTRAVENOUS

## 2018-07-12 MED ORDER — CIPROFLOXACIN IN D5W 400 MG/200ML IV SOLN
400.0000 mg | Freq: Two times a day (BID) | INTRAVENOUS | Status: DC
  Administered 2018-07-13 – 2018-07-14 (×4): 400 mg via INTRAVENOUS
  Filled 2018-07-12 (×4): qty 200

## 2018-07-12 MED ORDER — VILAZODONE HCL 20 MG PO TABS
20.0000 mg | ORAL_TABLET | Freq: Every day | ORAL | Status: DC
  Administered 2018-07-12 – 2018-07-15 (×4): 20 mg via ORAL
  Filled 2018-07-12 (×5): qty 1

## 2018-07-12 MED ORDER — FENTANYL CITRATE (PF) 100 MCG/2ML IJ SOLN
INTRAMUSCULAR | Status: AC | PRN
  Administered 2018-07-12 (×2): 25 ug via INTRAVENOUS
  Administered 2018-07-12: 75 ug via INTRAVENOUS

## 2018-07-12 MED ORDER — ONDANSETRON HCL 4 MG/2ML IJ SOLN
INTRAMUSCULAR | Status: AC | PRN
  Administered 2018-07-12: 4 mg via INTRAVENOUS

## 2018-07-12 MED ORDER — METRONIDAZOLE IN NACL 5-0.79 MG/ML-% IV SOLN
500.0000 mg | Freq: Once | INTRAVENOUS | Status: AC
  Administered 2018-07-12: 500 mg via INTRAVENOUS
  Filled 2018-07-12: qty 100

## 2018-07-12 MED ORDER — ALBUTEROL SULFATE (2.5 MG/3ML) 0.083% IN NEBU: 3.0000 mL | INHALATION_SOLUTION | Freq: Four times a day (QID) | RESPIRATORY_TRACT | Status: DC | PRN

## 2018-07-12 MED ORDER — MIDAZOLAM HCL 2 MG/2ML IJ SOLN
INTRAMUSCULAR | Status: AC | PRN
  Administered 2018-07-12: 0.5 mg via INTRAVENOUS
  Administered 2018-07-12: 1.5 mg via INTRAVENOUS

## 2018-07-12 MED ORDER — ACETAMINOPHEN 325 MG PO TABS
650.0000 mg | ORAL_TABLET | Freq: Four times a day (QID) | ORAL | Status: DC | PRN
  Administered 2018-07-13 (×2): 650 mg via ORAL
  Filled 2018-07-12 (×2): qty 2

## 2018-07-12 MED ORDER — CIPROFLOXACIN IN D5W 400 MG/200ML IV SOLN
400.0000 mg | Freq: Once | INTRAVENOUS | Status: AC
  Administered 2018-07-12: 400 mg via INTRAVENOUS
  Filled 2018-07-12: qty 200

## 2018-07-12 MED ORDER — ONDANSETRON HCL 4 MG/2ML IJ SOLN
INTRAMUSCULAR | Status: AC
  Filled 2018-07-12: qty 2

## 2018-07-12 MED ORDER — ONDANSETRON HCL 4 MG/2ML IJ SOLN
4.0000 mg | Freq: Once | INTRAMUSCULAR | Status: AC
  Administered 2018-07-12: 4 mg via INTRAVENOUS
  Filled 2018-07-12: qty 2

## 2018-07-12 MED ORDER — MIDAZOLAM HCL 2 MG/2ML IJ SOLN
INTRAMUSCULAR | Status: AC
  Filled 2018-07-12: qty 4

## 2018-07-12 MED ORDER — TEMAZEPAM 15 MG PO CAPS
15.0000 mg | ORAL_CAPSULE | Freq: Every day | ORAL | Status: DC
  Administered 2018-07-12 – 2018-07-16 (×5): 15 mg via ORAL
  Filled 2018-07-12 (×5): qty 1

## 2018-07-12 MED ORDER — ALPRAZOLAM ER 1 MG PO TB24
2.0000 mg | ORAL_TABLET | Freq: Every day | ORAL | Status: DC
  Administered 2018-07-12 – 2018-07-16 (×5): 2 mg via ORAL
  Filled 2018-07-12 (×5): qty 2

## 2018-07-12 MED ORDER — OXCARBAZEPINE 300 MG PO TABS
300.0000 mg | ORAL_TABLET | Freq: Every day | ORAL | Status: DC
  Administered 2018-07-12 – 2018-07-17 (×6): 300 mg via ORAL
  Filled 2018-07-12 (×6): qty 1

## 2018-07-12 MED ORDER — SODIUM CHLORIDE 0.9 % IV SOLN
INTRAVENOUS | Status: DC
  Administered 2018-07-12 – 2018-07-17 (×5): via INTRAVENOUS

## 2018-07-12 MED ORDER — ACETAMINOPHEN 650 MG RE SUPP: 650.0000 mg | Freq: Four times a day (QID) | RECTAL | Status: DC | PRN

## 2018-07-12 MED ORDER — HYDROMORPHONE HCL 1 MG/ML IJ SOLN
1.0000 mg | INTRAMUSCULAR | Status: DC | PRN
  Administered 2018-07-12 (×2): 1 mg via INTRAVENOUS
  Filled 2018-07-12 (×2): qty 1

## 2018-07-12 MED ORDER — VILAZODONE HCL 10 MG PO TABS
10.0000 mg | ORAL_TABLET | Freq: Every day | ORAL | Status: DC
  Administered 2018-07-12 – 2018-07-15 (×4): 10 mg via ORAL
  Filled 2018-07-12 (×4): qty 1

## 2018-07-12 NOTE — H&P (Signed)
History and Physical    Tamara [name] PJA:[id number] DOB: 02-Mar-1961 DOA: 07/12/2018  PCP: Jilda Panda, MD  Patient coming from: home  I have personally briefly reviewed patient's old medical records in [place]  Chief Complaint: Home  HPI: Tamara [name] is a 58 y.o. female with medical history significant of diverticulitis with perforation complicated with sepsis with recent hospitalization December 2019 as well as CKD stage III, anxiety/PTSD, GERD and morbid obesity.  Patient reports he had acute onset of abdominal pain approximately 2:00 in the morning which he described as 10 out of 10 pain.  Patient reported very similar to the prior pain she had in September with similar symptoms.  She reports no trauma no recent colonoscopies no changes in health other than this morning.  In the ER patient is CT scan concerning for diverticulitis and air-fluid collection with contained perforation and developing abscess.  The case was discussed with both general surgery and IR with plans for admission n.p.o. status but no acute plan for surgery as patient is surprisingly nontoxic-appearing with a normal white count, normal blood pressure and is afebrile.  ED Course: As noted CT with diverticulitis with a 6-1/2 x 3.9 cm air-fluid collection with contained perforation and developing abscess.Marland Kitchen  CBC with a white count of 9.4 hemoglobin 12.9 platelets 251.  BMP notable for creatinine 1.24 up from 0.87 September and most recent vital signs with a mildly elevated pulse of 114 respirations teens to 20s and blood pressure 143/113  Review of Systems: As per HPI otherwise 10 point review of systems negative.    Past Medical History:  Diagnosis Date  . Allergy   . Anemia   . Anxiety   . Asthma   . Depression   . Diverticulitis   . GERD (gastroesophageal reflux disease)   . History of short term memory loss   . HTN (hypertension)   . Hyperlipemia   . Neuromuscular disorder ([place])   . PTSD  (post-traumatic stress disorder)   . Shock therapy as cause of abnormal reaction of patient or of later complication without mention of misadventure at time of procedure     Past Surgical History:  Procedure Laterality Date  . ABDOMINAL HYSTERECTOMY     partial  . CHOLECYSTECTOMY    . COLONOSCOPY    . OTHER SURGICAL HISTORY     electric shock therapy  . TUBAL LIGATION    . UPPER GASTROINTESTINAL ENDOSCOPY       reports that she has never smoked. She has never used smokeless tobacco. She reports that she does not drink alcohol or use drugs.  Allergies  Allergen Reactions  . Benadryl [Diphenhydramine] Anaphylaxis  . Darvon [Propoxyphene] Other (See Comments)    Hallucinations   . Dilaudid [Hydromorphone Hcl]     HALLUCINATIONS  . Gabapentin     Unknown reaction, patient cannot remember   . Ivp Dye [Iodinated Diagnostic Agents] Anaphylaxis and Swelling  . Levaquin [Levofloxacin] Nausea And Vomiting    Patient states she tolerates Cipro  . Morphine And Related Itching  . Penicillins Rash    Has patient had a PCN reaction causing immediate rash, facial/tongue/throat swelling, SOB or lightheadedness with hypotension: yes Has patient had a PCN reaction causing severe rash involving mucus membranes or skin necrosis: yes Has patient had a PCN reaction that required hospitalization: No Has patient had a PCN reaction occurring within the last 10 years: yes If all of the above answers are "NO", then may proceed with  Cephalosporin use.   Marland Kitchen Propoxyphene Hcl Other (See Comments)    hallucination  . Rocephin [Ceftriaxone] Rash    Welts; Pt said she tolerates keflex  . Seroquel [Quetiapine Fumerate]     Rectal bleeding, abdominal cramping  . Statins Other (See Comments)    Severe mm pain (esp. Arms) >> DOES NOT WANT TO RETRY!  . Sulfonamide Derivatives Rash    Welts   . Adhesive [Tape]     This is tape as well as adhesive on bandaids.  . Dicyclomine     Vision loss in one eye  .  Other     All Anti-Histamines  . Oxycodone Other (See Comments)    amnesia  . Oxycontin [Oxycodone Hcl]     Amnesia    Family History  Problem Relation Age of Onset  . Arrhythmia Father   . Stroke Father   . Hyperlipidemia Father   . Heart disease Father   . Hypertension Father   . COPD Father   . Coronary artery disease Maternal Grandfather 40       Died 58  . Heart disease Maternal Grandfather   . Hypertension Maternal Grandfather   . Cancer Mother        bone marrow  . Hyperlipidemia Mother   . Hypertension Mother   . Hearing loss Paternal Grandmother   . Hearing loss Paternal Grandfather   . Diabetes Paternal Grandfather   . Diabetes Daughter   . Colon cancer Paternal Uncle   . Hyperlipidemia Brother   . Hyperlipidemia Maternal Grandmother   . Esophageal cancer Neg Hx   . Rectal cancer Neg Hx   . Stomach cancer Neg Hx      Prior to Admission medications   Medication Sig Start Date End Date Taking? Authorizing Provider  ALPRAZolam (XANAX XR) 1 MG 24 hr tablet Take 2 mg by mouth at bedtime.    Yes [provider]  ALPRAZolam Duanne Moron) 1 MG tablet Take 0.5-1 mg by mouth 2 (two) times daily as needed for anxiety.   Yes [provider]  aspirin 325 MG tablet Take 1 tablet (325 mg total) by mouth daily. 10/22/13  Yes Bernadene Bell, MD  Cholecalciferol (VITAMIN D3) 50000 UNITS CAPS Take 50,000 Units by mouth once a week. Patient taking differently: Take 50,000 Units by mouth every Saturday.  09/24/14  Yes [name], MD  dexlansoprazole (DEXILANT) 60 MG capsule Take 1 capsule (60 mg total) by mouth daily. 09/02/14  Yes [name], MD  doxycycline (VIBRAMYCIN) 100 MG capsule Take 100 mg by mouth daily.    Yes [provider]  fenofibrate (TRICOR) 145 MG tablet Take 1 tablet (145 mg total) by mouth daily. 05/22/16  Yes Wardell Honour, MD  losartan (COZAAR) 100 MG tablet Take 1 tablet (100 mg total) by mouth daily. 09/07/15  Yes [name], MD  LYSINE PO Take 1 tablet by mouth as needed. skin   Yes [provider]  Oxcarbazepine (TRILEPTAL) 300 MG tablet Take 300 mg by mouth daily.   Yes [provider]  temazepam (RESTORIL) 15 MG capsule Take 15 mg by mouth at bedtime.  01/16/17  Yes [provider]  temazepam (RESTORIL) 30 MG capsule Take 30 mg by mouth at bedtime.   Yes [provider]  Vilazodone HCl (VIIBRYD) 10 MG TABS Take 10 mg by mouth daily.   Yes [provider]  Vilazodone HCl (VIIBRYD) 20 MG TABS Take 20 mg by mouth daily.  Yes [provider]  albuterol (PROVENTIL HFA;VENTOLIN HFA) 108 (90 Base) MCG/ACT inhaler Inhale 2 puffs into the lungs every 6 (six) hours as needed for wheezing or shortness of breath. 07/05/15   Leandrew Koyanagi, MD  ciprofloxacin (CIPRO) 500 MG tablet Take 1 tablet (500 mg total) by mouth 2 (two) times daily. Patient not taking: Reported on 07/12/2018 05/05/18   Irene Shipper, MD  metroNIDAZOLE (FLAGYL) 500 MG tablet Take 1 tablet (500 mg total) by mouth 2 (two) times daily. Patient not taking: Reported on 07/12/2018 05/05/18   Irene Shipper, MD  nitroGLYCERIN (NITROSTAT) 0.4 MG SL tablet Place 1 tablet (0.4 mg total) under the tongue every 5 (five) minutes as needed for chest pain. 02/22/17   Croitoru, Mihai, MD  ranitidine (ZANTAC) 150 MG tablet Take 1 tablet (150 mg total) by mouth at bedtime. Patient not taking: Reported on 07/12/2018 06/18/18   Irene Shipper, MD    Physical Exam: Vitals:   07/12/18 1045 07/12/18 1100 07/12/18 1115 07/12/18 1130  BP: (!) 131/99 (!) 131/96 (!) 133/99 (!) 143/113  Pulse: (!) 116 (!) 114 (!) 115 (!) 114  Resp: (!) 26 (!) 22 (!) 27 (!) 21  Temp:      TempSrc:      SpO2: 98% 100% 99% 98%  Weight:      Height:        Constitutional: NAD, calm, comfortable Vitals:   07/12/18 1045 07/12/18 1100 07/12/18 1115 07/12/18 1130  BP: (!) 131/99 (!) 131/96 (!) 133/99 (!) 143/113  Pulse: (!) 116 (!) 114 (!)  115 (!) 114  Resp: (!) 26 (!) 22 (!) 27 (!) 21  Temp:      TempSrc:      SpO2: 98% 100% 99% 98%  Weight:      Height:       Eyes: PERRL, lids and conjunctivae normal ENMT: Mucous membranes are moist. Posterior pharynx clear of any exudate or lesions.Normal dentition.  Neck: normal, supple, no masses, no thyromegaly Respiratory: clear to auscultation bilaterally, no wheezing, no crackles. Normal respiratory effort. No accessory muscle use.  Cardiovascular: Regular rate and rhythm, no murmurs / rubs / gallops. No extremity edema. 2+ pedal pulses. No carotid bruits.  Abdomen: Abdomen tender to palpation suprapubic region, protuberant but no rebound no guarding not distended not rigid Musculoskeletal: no clubbing / cyanosis. No joint deformity upper and lower extremities. Good ROM, no contractures. Normal muscle tone.  Skin: no rashes, lesions, ulcers. No induration Neurologic: CN 2-12 grossly intact. Sensation intact, DTR normal. Strength 5/5 in all 4.  Psychiatric: Normal judgment and insight. Alert and oriented x 3. Normal mood.     Labs on Admission: I have personally reviewed following labs and imaging studies  CBC: Recent Labs  Lab 07/12/18 0640  WBC 9.4  HGB 12.9  HCT 39.1  MCV 86.5  PLT 941   Basic Metabolic Panel: Recent Labs  Lab 07/12/18 0640  NA 136  K 3.7  CL 103  CO2 24  GLUCOSE 101*  BUN 28*  CREATININE 1.24*  CALCIUM 10.8*   GFR: Estimated Creatinine Clearance: 51.4 mL/min (A) (by C-G formula based on SCr of 1.24 mg/dL (H)). Liver Function Tests: Recent Labs  Lab 07/12/18 0640  AST 20  ALT 15  ALKPHOS 64  BILITOT 1.0  PROT 6.8  ALBUMIN 4.1   Recent Labs  Lab 07/12/18 0640  LIPASE 56*   No results for input(s): AMMONIA in the last 168 hours. Coagulation Profile:  No results for input(s): INR, PROTIME in the last 168 hours. Cardiac Enzymes: No results for input(s): CKTOTAL, CKMB, CKMBINDEX, TROPONINI in the last 168 hours. BNP (last 3  results) No results for input(s): PROBNP in the last 8760 hours. HbA1C: No results for input(s): HGBA1C in the last 72 hours. CBG: No results for input(s): GLUCAP in the last 168 hours. Lipid Profile: No results for input(s): CHOL, HDL, LDLCALC, TRIG, CHOLHDL, LDLDIRECT in the last 72 hours. Thyroid Function Tests: No results for input(s): TSH, T4TOTAL, FREET4, T3FREE, THYROIDAB in the last 72 hours. Anemia Panel: No results for input(s): VITAMINB12, FOLATE, FERRITIN, TIBC, IRON, RETICCTPCT in the last 72 hours. Urine analysis:    Component Value Date/Time   COLORURINE YELLOW 07/12/2018 0640   APPEARANCEUR HAZY (A) 07/12/2018 0640   LABSPEC 1.010 07/12/2018 0640   PHURINE 8.0 07/12/2018 0640   GLUCOSEU NEGATIVE 07/12/2018 0640   GLUCOSEU NEGATIVE 02/16/2010 1117   [place] 07/12/2018 0640   BILIRUBINUR NEGATIVE 07/12/2018 0640   BILIRUBINUR negative 10/05/2015 1530   BILIRUBINUR neg 09/02/2014 1213   KETONESUR NEGATIVE 07/12/2018 0640   PROTEINUR NEGATIVE 07/12/2018 0640   UROBILINOGEN 0.2 10/05/2015 1530   UROBILINOGEN 1.0 03/19/2014 1910   NITRITE NEGATIVE 07/12/2018 0640   LEUKOCYTESUR NEGATIVE 07/12/2018 0640    Radiological Exams on Admission: Ct Abdomen Pelvis Wo Contrast  Result Date: 07/12/2018 CLINICAL DATA:  Lower abdominal pain. EXAM: CT ABDOMEN AND PELVIS WITHOUT CONTRAST TECHNIQUE: Multidetector CT imaging of the abdomen and pelvis was performed following the standard protocol without IV contrast. COMPARISON:  CT scan of May 15, 2018. FINDINGS: Lower chest: Visualized lung bases are unremarkable. Moderate size sliding-type hiatal hernia is noted. Hepatobiliary: Status post cholecystectomy. Probable fatty infiltration of the liver. No biliary dilatation is noted. Pancreas: Unremarkable. No pancreatic ductal dilatation or surrounding inflammatory changes. Spleen: Normal in size without focal abnormality. Adrenals/Urinary Tract: Adrenal glands are  unremarkable. Kidneys are normal, without renal calculi, focal lesion, or hydronephrosis. Bladder is unremarkable. Stomach/Bowel: The stomach and appendix are unremarkable. There is no evidence of bowel obstruction. Worsening distal sigmoid diverticulitis is noted with adjacent 6.5 x 3.9 cm air collection with small fluid level, consistent with contained perforation and developing abscess. The perforation has a very wide neck measuring 1.4 cm. Vascular/Lymphatic: Aortic atherosclerosis. No enlarged abdominal or pelvic lymph nodes. Reproductive: Status post hysterectomy. No adnexal masses. Other: No abdominal wall hernia or abnormality. No abdominopelvic ascites. Musculoskeletal: No acute or significant osseous findings. IMPRESSION: Distal sigmoid diverticulitis is noted which is significantly progressed compared to prior exam, with adjacent 6.5 x 3.9 cm air-fluid collection consistent with contained perforation and developing abscess. The perforation has a wide opening in the bowel wall, measuring 1.4 cm at least. Hepatic steatosis. Moderate size sliding-type hiatal hernia. Aortic Atherosclerosis (ICD10-I70.0). Electronically Signed   By: Marijo Conception, M.D.   On: 07/12/2018 08:24    EKG: Independently reviewed. none  Assessment/Plan Active Problems:   Hyperlipidemia   OBESITY   Depression   Essential hypertension   Diverticulitis of colon with perforation   CKD (chronic kidney disease), stage III (HCC)   Anxiety   PTSD   Diverticulitis of colon with perforation.  Patient has multiple allergies, will treat with Cipro and Flagyl.  Discussed the case with interventional radiology who will evaluate the patient for probable IR drain tomorrow, consult placed, n.p.o. after midnight, also discussed the case with general surgery Dr. Lucia Gaskins who will see the patient in consultation also.  This is a  recurrent problem he does not see an acute surgical intervention given patient's nontoxic appearing clinical  status, normal white count, normal blood pressure and no decompensation.  Depression with anxiety and PTSD patient is on significant doses of anxiolytics which will continue as long as blood pressure tolerates.  There is no acute decompensation  CKD.  Monitor patient's renal function, hydrate as she will be in n.p.o. status.  Avoid nephrotoxic agents.  Pharmacy to renally dose medications  Hyperlipidemia patient can resume her home antihyperlipidemics on discharge  Obesity.  Patient received weight loss counseling  DVT prophylaxis: scd Code Status: full Family Communication: Discussed plan of care with patient's uncle who was bedside as well as with patient in agreement with plan for admission, antibiotics, IR consultation and neurosurgery consultation Disposition Plan: To home likely 3 to 4 days Consults called: Interventional radiology Jenene Slicker and general surgery Dr. Lucia Gaskins Admission status: Inpatient telemetry   Nicolette Bang MD [facility]  If 7PM-7AM, please contact night-coverage [email or web address] Password [facility]  07/12/2018, 11:47 AM

## 2018-07-12 NOTE — ED Triage Notes (Signed)
Per EMS, pt. From home with complaint of abdominal pain at 10/10 which started at 0230 am today. Denied diarrhea, denied N/V. Pt. Diagnosed with diverticulitis Sept.2019. received 7mcg of IV fentanyl x 2 doses via EMS , stated pain is now bearable. Ambulatory upon arrival to ED, walked to bathroom.

## 2018-07-12 NOTE — Consult Note (Addendum)
Re:   Heiley Shaikh DOB:   1961/06/10 MRN:   [id number]  Chief Complaint Abdominal pain  ASSESEMENT AND PLAN: 1.  Diverticulitis with focal perforation and air.  Agree with attempt at perc drain.  If infection can be controlled, she will probably need an elective sigmoid colon resection.  If infection cannot be controlled, she would need more urgent operation with probable colostomy.  She understands the medical/surgical plan.  2.  Depression/Anxiety 3.  GERD with moderate sized HH 4.  HTN 5.  PTSD 6.  Obesity - BMI 34.5 7.  Atherosclerosis  Chief Complaint  Patient presents with  . Abdominal Pain   PHYSICIAN REQUESTING CONSULTATION: Jilda Panda, MD  HISTORY OF PRESENT ILLNESS: Wayne Brunker is a 58 y.o. (DOB: April 25, 1961)  white female whose primary care physician is Jilda Panda, MD.  She is with her uncle, Shirlee Latch.   She comes the [facility] with abdominal pain.  She was hospitalized from 03/14/2018 - 03/21/2018 for diverticulitis.  The current perf is in the same area as a CT scan on 03/14/2018.  She had one bout prior to that hospitalization.  And she had one bout in November 2019, but the CT scan was fairly unremarkable.  She sees Dr. Henrene Pastor for GI. She had a colonoscopy 02/16/2017 which just showed diverticulosis.  He did mention rectosigmoid stenosis - but no photos of the stenosis??  She had a open cholecystectomy 1986.  She had a hysterectomy in 2000.  She had an abdominal CT scan - 07/12/2018 - 1)  Distal sigmoid diverticulitis is noted which is significantly progressed compared to prior exam, with adjacent 6.5 x 3.9 cm air-fluid collection consistent with contained perforation and developing abscess. The perforation has a wide opening in the bowel wall, measuring 1.4 cm at least.  2)  Hepatic steatosis.  3)  Moderate size sliding-type hiatal hernia.  4)  Aortic Atherosclerosis (ICD10-I70.0). WBC - 9,400 - 07/12/2018    Past Medical History:  Diagnosis Date    . Allergy   . Anemia   . Anxiety   . Asthma   . Depression   . Diverticulitis   . GERD (gastroesophageal reflux disease)   . History of short term memory loss   . HTN (hypertension)   . Hyperlipemia   . Neuromuscular disorder ([place])   . PTSD (post-traumatic stress disorder)   . Shock therapy as cause of abnormal reaction of patient or of later complication without mention of misadventure at time of procedure       Past Surgical History:  Procedure Laterality Date  . ABDOMINAL HYSTERECTOMY     partial  . CHOLECYSTECTOMY    . COLONOSCOPY    . OTHER SURGICAL HISTORY     electric shock therapy  . TUBAL LIGATION    . UPPER GASTROINTESTINAL ENDOSCOPY        Current Facility-Administered Medications  Medication Dose Route Frequency Provider Last Rate Last Dose  . 0.9 %  sodium chloride infusion   Intravenous Continuous Daleen Bo, MD      . metroNIDAZOLE (FLAGYL) IVPB 500 mg  500 mg Intravenous Once Daleen Bo, MD      . ondansetron (ZOFRAN-ODT) disintegrating tablet 4 mg  4 mg Oral Once PRN Daleen Bo, MD       Current Outpatient Medications  Medication Sig Dispense Refill  . ALPRAZolam (XANAX XR) 1 MG 24 hr tablet Take 2 mg by mouth at bedtime.     . ALPRAZolam (XANAX) 1 MG  tablet Take 0.5-1 mg by mouth 2 (two) times daily as needed for anxiety.    Marland Kitchen aspirin 325 MG tablet Take 1 tablet (325 mg total) by mouth daily. 30 tablet 0  . Cholecalciferol (VITAMIN D3) 50000 UNITS CAPS Take 50,000 Units by mouth once a week. (Patient taking differently: Take 50,000 Units by mouth every Saturday. ) 12 capsule 2  . dexlansoprazole (DEXILANT) 60 MG capsule Take 1 capsule (60 mg total) by mouth daily. 90 capsule 3  . doxycycline (VIBRAMYCIN) 100 MG capsule Take 100 mg by mouth daily.     . fenofibrate (TRICOR) 145 MG tablet Take 1 tablet (145 mg total) by mouth daily. 90 tablet 0  . losartan (COZAAR) 100 MG tablet Take 1 tablet (100 mg total) by mouth daily. 90 tablet 3  .  LYSINE PO Take 1 tablet by mouth as needed. skin    . Oxcarbazepine (TRILEPTAL) 300 MG tablet Take 300 mg by mouth daily.    . temazepam (RESTORIL) 15 MG capsule Take 15 mg by mouth at bedtime.     . temazepam (RESTORIL) 30 MG capsule Take 30 mg by mouth at bedtime.    . Vilazodone HCl (VIIBRYD) 10 MG TABS Take 10 mg by mouth daily.    . Vilazodone HCl (VIIBRYD) 20 MG TABS Take 20 mg by mouth daily.    Marland Kitchen albuterol (PROVENTIL HFA;VENTOLIN HFA) 108 (90 Base) MCG/ACT inhaler Inhale 2 puffs into the lungs every 6 (six) hours as needed for wheezing or shortness of breath. 1 Inhaler 3  . ciprofloxacin (CIPRO) 500 MG tablet Take 1 tablet (500 mg total) by mouth 2 (two) times daily. (Patient not taking: Reported on 07/12/2018) 20 tablet 0  . metroNIDAZOLE (FLAGYL) 500 MG tablet Take 1 tablet (500 mg total) by mouth 2 (two) times daily. (Patient not taking: Reported on 07/12/2018) 20 tablet 0  . nitroGLYCERIN (NITROSTAT) 0.4 MG SL tablet Place 1 tablet (0.4 mg total) under the tongue every 5 (five) minutes as needed for chest pain. 25 tablet 3  . ranitidine (ZANTAC) 150 MG tablet Take 1 tablet (150 mg total) by mouth at bedtime. (Patient not taking: Reported on 07/12/2018) 90 tablet 3      Allergies  Allergen Reactions  . Benadryl [Diphenhydramine] Anaphylaxis  . Darvon [Propoxyphene] Other (See Comments)    Hallucinations   . Dilaudid [Hydromorphone Hcl]     HALLUCINATIONS  . Gabapentin     Unknown reaction, patient cannot remember   . Ivp Dye [Iodinated Diagnostic Agents] Anaphylaxis and Swelling  . Levaquin [Levofloxacin] Nausea And Vomiting    Patient states she tolerates Cipro  . Morphine And Related Itching  . Penicillins Rash    Has patient had a PCN reaction causing immediate rash, facial/tongue/throat swelling, SOB or lightheadedness with hypotension: yes Has patient had a PCN reaction causing severe rash involving mucus membranes or skin necrosis: yes Has patient had a PCN reaction that  required hospitalization: No Has patient had a PCN reaction occurring within the last 10 years: yes If all of the above answers are "NO", then may proceed with Cephalosporin use.   Marland Kitchen Propoxyphene Hcl Other (See Comments)    hallucination  . Rocephin [Ceftriaxone] Rash    Welts; Pt said she tolerates keflex  . Seroquel [Quetiapine Fumerate]     Rectal bleeding, abdominal cramping  . Statins Other (See Comments)    Severe mm pain (esp. Arms) >> DOES NOT WANT TO RETRY!  . Sulfonamide Derivatives Rash  Welts   . Adhesive [Tape]     This is tape as well as adhesive on bandaids.  . Dicyclomine     Vision loss in one eye  . Other     All Anti-Histamines  . Oxycodone Other (See Comments)    amnesia  . Oxycontin [Oxycodone Hcl]     Amnesia    REVIEW OF SYSTEMS: Skin:  No history of rash.  No history of abnormal moles. Infection:  No history of hepatitis or HIV.  No history of MRSA. Neurologic:  No history of stroke.  No history of seizure.  No history of headaches. Cardiac:  HTN x 20 years.  Has seen Dr. Sallyanne Kuster for atypical chest pain. Myoview perfusion scan - 02/27/2017 - negative Pulmonary:  Does not smoke cigarettes.  No asthma or bronchitis.  No OSA/CPAP.  Endocrine:  No diabetes. No thyroid disease. Gastrointestinal:  See HPI Urologic:  Remote history of kidney stones (20 years ago) Musculoskeletal:  No history of joint or back disease. Hematologic:  No bleeding disorder.  No history of anemia.  Not anticoagulated. Psycho-social:  The patient is oriented.   She sees Dr. Casimiro Needle for PTSD (from divorce) and depression. She has had ECT at [facility].  No recent problems.  SOCIAL and FAMILY HISTORY: Boy friend - Hayes Ludwig of 20 years (he has MS and does not come to the hospital) On disability for mental health reasons since 2009.       She takes care of he step mother.   She is with her uncle, Shirlee Latch.  (they are about the same age)  PHYSICAL EXAM: BP (!)  132/95   Pulse (!) 114   Temp 98.3 F (36.8 C) (Oral)   Resp (!) 24   Ht 5\' 2"  (1.575 m)   Wt 85.7 kg   SpO2 97%   BMI 34.57 kg/m   General: Obese WF who is alert and generally healthy appearing.  Skin:  Inspection and palpation - no mass or rash. Eyes:  Conjunctiva and lids unremarkable.            Pupils are equal Ears, Nose, Mouth, and Throat:  Ears and nose unremarkable            Lips and teeth are unremarable. Neck: Supple. No mass, trachea midline.  No thyroid mass. Lymph Nodes:  No supraclavicular, cervical, or inguinal nodes. Lungs: Normal respiratory effort.  Clear to auscultation and symmetric breath sounds. Heart:  Palpation of the heart is normal.            Auscultation: RRR. No murmur or rub.  Abdomen: Soft. No mass. No hernia.             Right subcostal incsion.  Tenderness in the lower 1/2 of her abdomen.  No peritoneal sxes. Rectal: Not done. Musculoskeletal:  Normal gait.            Good muscle strength and ROM  in upper and lower extremities.  Neurologic:  Grossly intact to motor and sensory function. Psychiatric: Normal judgement and insight. Behavior is normal.            Oriented to time, person, place.   DATA REVIEWED, COUNSELING AND COORDINATION OF CARE: Epic notes reviewed. Counseling and coordination of care exceeded more than 50% of the time spent with patient. Total time spent with patient and charting: 50 minutes  Alphonsa Overall, MD,  [facility], [address]  [phone]  [place], [place]    [place] Phone:  [phone] FAX:  [phone]

## 2018-07-12 NOTE — ED Notes (Signed)
ED TO INPATIENT HANDOFF REPORT  Name/Age/Gender Tamara [name] 58 y.o. female  Code Status Code Status History    Date Active Date Inactive Code Status Order ID Comments User Context   03/14/2018 2350 03/21/2018 1739 Full Code [id number]  Norval Morton, MD ED   10/22/2013 0220 10/22/2013 2026 Full Code [id number]  Leone Haven, MD Inpatient      Home/SNF/Other Home  Chief Complaint Abdominal Pain  Level of Care/Admitting Diagnosis ED Disposition    ED Disposition Condition [place] [facility] Area: [facility] [[id number]]  Level of Care: Telemetry [5]  Admit to tele based on following criteria: Other see comments  Comments: perf bowel wall  Diagnosis: Diverticulitis of colon with perforation [094709]  Admitting Physician: Marcell Anger [[id number]]  Attending Physician: Marcell Anger [[id number]]  Estimated length of stay: past midnight tomorrow  Certification:: I certify this patient will need inpatient services for at least 2 midnights  PT Class (Do Not Modify): Inpatient [101]  PT Acc Code (Do Not Modify): Private [1]       Medical History Past Medical History:  Diagnosis Date  . Allergy   . Anemia   . Anxiety   . Asthma   . Depression   . Diverticulitis   . GERD (gastroesophageal reflux disease)   . History of short term memory loss   . HTN (hypertension)   . Hyperlipemia   . Neuromuscular disorder ([place])   . PTSD (post-traumatic stress disorder)   . Shock therapy as cause of abnormal reaction of patient or of later complication without mention of misadventure at time of procedure     Allergies Allergies  Allergen Reactions  . Benadryl [Diphenhydramine] Anaphylaxis  . Darvon [Propoxyphene] Other (See Comments)    Hallucinations   . Dilaudid [Hydromorphone Hcl]     HALLUCINATIONS  . Gabapentin     Unknown reaction, patient cannot remember   . Ivp Dye [Iodinated Diagnostic Agents] Anaphylaxis and Swelling   . Levaquin [Levofloxacin] Nausea And Vomiting    Patient states she tolerates Cipro  . Morphine And Related Itching  . Penicillins Rash    Has patient had a PCN reaction causing immediate rash, facial/tongue/throat swelling, SOB or lightheadedness with hypotension: yes Has patient had a PCN reaction causing severe rash involving mucus membranes or skin necrosis: yes Has patient had a PCN reaction that required hospitalization: No Has patient had a PCN reaction occurring within the last 10 years: yes If all of the above answers are "NO", then may proceed with Cephalosporin use.   Marland Kitchen Propoxyphene Hcl Other (See Comments)    hallucination  . Rocephin [Ceftriaxone] Rash    Welts; Pt said she tolerates keflex  . Seroquel [Quetiapine Fumerate]     Rectal bleeding, abdominal cramping  . Statins Other (See Comments)    Severe mm pain (esp. Arms) >> DOES NOT WANT TO RETRY!  . Sulfonamide Derivatives Rash    Welts   . Adhesive [Tape]     This is tape as well as adhesive on bandaids.  . Dicyclomine     Vision loss in one eye  . Other     All Anti-Histamines  . Oxycodone Other (See Comments)    amnesia  . Oxycontin [Oxycodone Hcl]     Amnesia    IV Location/Drains/Wounds Patient Lines/Drains/Airways Status   Active Line/Drains/Airways    Name:   Placement date:   Placement time:   Site:   Days:  Peripheral IV 07/12/18 Left Antecubital   07/12/18    -    Antecubital   less than 1          Labs/Imaging Results for orders placed or performed during the hospital encounter of 07/12/18 (from the past 48 hour(s))  Lipase, blood     Status: Abnormal   Collection Time: 07/12/18  6:40 AM  Result Value Ref Range   Lipase 56 (H) 11 - 51 U/L    Comment: Performed at [facility], [place] [address], [place], [place] [address]  Comprehensive metabolic panel     Status: Abnormal   Collection Time: 07/12/18  6:40 AM  Result Value Ref Range   Sodium 136 135 - 145 mmol/L    Potassium 3.7 3.5 - 5.1 mmol/L   Chloride 103 98 - 111 mmol/L   CO2 24 22 - 32 mmol/L   Glucose, Bld 101 (H) 70 - 99 mg/dL   BUN 28 (H) 6 - 20 mg/dL   Creatinine, Ser 1.24 (H) 0.44 - 1.00 mg/dL   Calcium 10.8 (H) 8.9 - 10.3 mg/dL   Total Protein 6.8 6.5 - 8.1 g/dL   Albumin 4.1 3.5 - 5.0 g/dL   AST 20 15 - 41 U/L   ALT 15 0 - 44 U/L   Alkaline Phosphatase 64 38 - 126 U/L   Total Bilirubin 1.0 0.3 - 1.2 mg/dL   GFR calc non Af Amer 49 (L) >60 mL/min   GFR calc Af Amer 56 (L) >60 mL/min   Anion gap 9 5 - 15    Comment: Performed at [facility], [place] [address], [place], [place] [address]  CBC     Status: None   Collection Time: 07/12/18  6:40 AM  Result Value Ref Range   WBC 9.4 4.0 - 10.5 K/uL   RBC 4.52 3.87 - 5.11 MIL/uL   Hemoglobin 12.9 12.0 - 15.0 g/dL   HCT 39.1 36.0 - 46.0 %   MCV 86.5 80.0 - 100.0 fL   MCH 28.5 26.0 - 34.0 pg   MCHC 33.0 30.0 - 36.0 g/dL   RDW 12.6 11.5 - 15.5 %   Platelets 251 150 - 400 K/uL   nRBC 0.0 0.0 - 0.2 %    Comment: Performed at [facility], [place] [address], [place], [place] [address]  Urinalysis, Routine w reflex microscopic     Status: Abnormal   Collection Time: 07/12/18  6:40 AM  Result Value Ref Range   Color, Urine YELLOW YELLOW   APPearance HAZY (A) CLEAR   Specific Gravity, Urine 1.010 1.005 - 1.030   pH 8.0 5.0 - 8.0   Glucose, UA NEGATIVE NEGATIVE mg/dL   Hgb urine dipstick NEGATIVE NEGATIVE   Bilirubin Urine NEGATIVE NEGATIVE   Ketones, ur NEGATIVE NEGATIVE mg/dL   Protein, ur NEGATIVE NEGATIVE mg/dL   Nitrite NEGATIVE NEGATIVE   Leukocytes, UA NEGATIVE NEGATIVE    Comment: Performed at [facility] [address] [place], [place] [address]   Ct Abdomen Pelvis Wo Contrast  Result Date: 07/12/2018 CLINICAL DATA:  Lower abdominal pain. EXAM: CT ABDOMEN AND PELVIS WITHOUT CONTRAST TECHNIQUE: Multidetector CT imaging of the abdomen and pelvis was performed following  the standard protocol without IV contrast. COMPARISON:  CT scan of May 15, 2018. FINDINGS: Lower chest: Visualized lung bases are unremarkable. Moderate size sliding-type hiatal hernia is noted. Hepatobiliary: Status post cholecystectomy. Probable fatty infiltration of the liver. No biliary dilatation is  noted. Pancreas: Unremarkable. No pancreatic ductal dilatation or surrounding inflammatory changes. Spleen: Normal in size without focal abnormality. Adrenals/Urinary Tract: Adrenal glands are unremarkable. Kidneys are normal, without renal calculi, focal lesion, or hydronephrosis. Bladder is unremarkable. Stomach/Bowel: The stomach and appendix are unremarkable. There is no evidence of bowel obstruction. Worsening distal sigmoid diverticulitis is noted with adjacent 6.5 x 3.9 cm air collection with small fluid level, consistent with contained perforation and developing abscess. The perforation has a very wide neck measuring 1.4 cm. Vascular/Lymphatic: Aortic atherosclerosis. No enlarged abdominal or pelvic lymph nodes. Reproductive: Status post hysterectomy. No adnexal masses. Other: No abdominal wall hernia or abnormality. No abdominopelvic ascites. Musculoskeletal: No acute or significant osseous findings. IMPRESSION: Distal sigmoid diverticulitis is noted which is significantly progressed compared to prior exam, with adjacent 6.5 x 3.9 cm air-fluid collection consistent with contained perforation and developing abscess. The perforation has a wide opening in the bowel wall, measuring 1.4 cm at least. Hepatic steatosis. Moderate size sliding-type hiatal hernia. Aortic Atherosclerosis (ICD10-I70.0). Electronically Signed   By: Marijo Conception, M.D.   On: 07/12/2018 08:24   None  Pending Labs Unresulted Labs (From admission, onward)    Start     Ordered   07/12/18 1200  Lactic acid, plasma  STAT Now then every 3 hours,   R     07/12/18 1159   Signed and Held  Basic metabolic panel  Tomorrow morning,    R     Signed and Held   Signed and Held  CBC  Tomorrow morning,   R     Signed and Held          Vitals/Pain Today's Vitals   07/12/18 1045 07/12/18 1100 07/12/18 1115 07/12/18 1130  BP: (!) 131/99 (!) 131/96 (!) 133/99 (!) 143/113  Pulse: (!) 116 (!) 114 (!) 115 (!) 114  Resp: (!) 26 (!) 22 (!) 27 (!) 21  Temp:      TempSrc:      SpO2: 98% 100% 99% 98%  Weight:      Height:      PainSc:        Isolation Precautions No active isolations  Medications Medications  ondansetron (ZOFRAN-ODT) disintegrating tablet 4 mg (has no administration in time range)  0.9 %  sodium chloride infusion (has no administration in time range)  metroNIDAZOLE (FLAGYL) IVPB 500 mg (has no administration in time range)  ondansetron (ZOFRAN) injection 4 mg (4 mg Intravenous Given 07/12/18 0752)  sodium chloride 0.9 % bolus 1,000 mL (0 mLs Intravenous Stopped 07/12/18 1030)  ketamine 50 mg in normal saline 5 mL (10 mg/mL) syringe (26 mg Intravenous Given 07/12/18 0752)  ciprofloxacin (CIPRO) IVPB 400 mg (400 mg Intravenous New Bag/Given 07/12/18 1029)    Mobility walks

## 2018-07-12 NOTE — Procedures (Signed)
  Procedure: CT R pelvic abscess drain placement 79f EBL:   minimal Complications:  none immediate  See full dictation in [facility].  Dillard Cannon MD Main # [phone] Pager  [phone]   ok

## 2018-07-12 NOTE — ED Notes (Signed)
Called RN Ginger to inform that patient will be transported to IR first prior to going upstairs.

## 2018-07-12 NOTE — H&P (Signed)
Chief Complaint: Pelvic abscess  Referring Physician(s): Nicolette Bang  Supervising Physician: Arne Cleveland  Patient Status: [facility] - ED  History of Present Illness: Tamara [name] is a 58 y.o. female with recent history of diverticulitis with perforation.  She was hospitalized last September and developed sepsis secondary to diverticulitis.  Other medical issues include CKD stage III, anxiety/PTSD, GERD, and a host of allergies.   She presented to the ED early this morning with complaints of acute onset of abdominal pain which started around 2:00 this morning.  She states it is very similar to the prior pain she had in September with similar symptoms.    CT scan showed = Distal sigmoid diverticulitis is noted which is significantly progressed compared to prior exam, with adjacent 6.5 x 3.9 cm air-fluid collection consistent with contained perforation and developing abscess. The perforation has a wide opening in the bowel wall, measuring 1.4 cm at least.  Per Dr. Daphene Jaeger note, he discussed her case with General Surgery and they have requested IR evaluation for percutaneous drain.  She is NPO. She takes 325 aspirin, no other blood thinners.  She does c/o some nause, no vomiting, no fever/chills.  Past Medical History:  Diagnosis Date  . Allergy   . Anemia   . Anxiety   . Asthma   . Depression   . Diverticulitis   . GERD (gastroesophageal reflux disease)   . History of short term memory loss   . HTN (hypertension)   . Hyperlipemia   . Neuromuscular disorder ([place])   . PTSD (post-traumatic stress disorder)   . Shock therapy as cause of abnormal reaction of patient or of later complication without mention of misadventure at time of procedure     Past Surgical History:  Procedure Laterality Date  . ABDOMINAL HYSTERECTOMY     partial  . CHOLECYSTECTOMY    . COLONOSCOPY    . OTHER SURGICAL HISTORY     electric shock therapy  . TUBAL LIGATION     . UPPER GASTROINTESTINAL ENDOSCOPY      Allergies: Benadryl [diphenhydramine]; Darvon [propoxyphene]; Dilaudid [hydromorphone hcl]; Gabapentin; Ivp dye [iodinated diagnostic agents]; Levaquin [levofloxacin]; Morphine and related; Penicillins; Propoxyphene hcl; Rocephin [ceftriaxone]; Seroquel [quetiapine fumerate]; Statins; Sulfonamide derivatives; Adhesive [tape]; Dicyclomine; Other; Oxycodone; and Oxycontin [oxycodone hcl]  Medications: Prior to Admission medications   Medication Sig Start Date End Date Taking? Authorizing Provider  ALPRAZolam (XANAX XR) 1 MG 24 hr tablet Take 2 mg by mouth at bedtime.    Yes [provider]  ALPRAZolam Duanne Moron) 1 MG tablet Take 0.5-1 mg by mouth 2 (two) times daily as needed for anxiety.   Yes [provider]  aspirin 325 MG tablet Take 1 tablet (325 mg total) by mouth daily. 10/22/13  Yes Bernadene Bell, MD  Cholecalciferol (VITAMIN D3) 50000 UNITS CAPS Take 50,000 Units by mouth once a week. Patient taking differently: Take 50,000 Units by mouth every Saturday.  09/24/14  Yes [name], MD  dexlansoprazole (DEXILANT) 60 MG capsule Take 1 capsule (60 mg total) by mouth daily. 09/02/14  Yes [name], MD  doxycycline (VIBRAMYCIN) 100 MG capsule Take 100 mg by mouth daily.    Yes [provider]  fenofibrate (TRICOR) 145 MG tablet Take 1 tablet (145 mg total) by mouth daily. 05/22/16  Yes Wardell Honour, MD  losartan (COZAAR) 100 MG tablet Take 1 tablet (100 mg total) by mouth daily. 09/07/15  Yes [name], MD  LYSINE PO Take  1 tablet by mouth as needed. skin   Yes [provider]  Oxcarbazepine (TRILEPTAL) 300 MG tablet Take 300 mg by mouth daily.   Yes [provider]  temazepam (RESTORIL) 15 MG capsule Take 15 mg by mouth at bedtime.  01/16/17  Yes [provider]  temazepam (RESTORIL) 30 MG capsule Take 30 mg by mouth at bedtime.   Yes [provider]  Vilazodone HCl  (VIIBRYD) 10 MG TABS Take 10 mg by mouth daily.   Yes [provider]  Vilazodone HCl (VIIBRYD) 20 MG TABS Take 20 mg by mouth daily.   Yes [provider]  albuterol (PROVENTIL HFA;VENTOLIN HFA) 108 (90 Base) MCG/ACT inhaler Inhale 2 puffs into the lungs every 6 (six) hours as needed for wheezing or shortness of breath. 07/05/15   Leandrew Koyanagi, MD  ciprofloxacin (CIPRO) 500 MG tablet Take 1 tablet (500 mg total) by mouth 2 (two) times daily. Patient not taking: Reported on 07/12/2018 05/05/18   Irene Shipper, MD  metroNIDAZOLE (FLAGYL) 500 MG tablet Take 1 tablet (500 mg total) by mouth 2 (two) times daily. Patient not taking: Reported on 07/12/2018 05/05/18   Irene Shipper, MD  nitroGLYCERIN (NITROSTAT) 0.4 MG SL tablet Place 1 tablet (0.4 mg total) under the tongue every 5 (five) minutes as needed for chest pain. 02/22/17   Croitoru, Mihai, MD  ranitidine (ZANTAC) 150 MG tablet Take 1 tablet (150 mg total) by mouth at bedtime. Patient not taking: Reported on 07/12/2018 06/18/18   Irene Shipper, MD     Family History  Problem Relation Age of Onset  . Arrhythmia Father   . Stroke Father   . Hyperlipidemia Father   . Heart disease Father   . Hypertension Father   . COPD Father   . Coronary artery disease Maternal Grandfather 40       Died 58  . Heart disease Maternal Grandfather   . Hypertension Maternal Grandfather   . Cancer Mother        bone marrow  . Hyperlipidemia Mother   . Hypertension Mother   . Hearing loss Paternal Grandmother   . Hearing loss Paternal Grandfather   . Diabetes Paternal Grandfather   . Diabetes Daughter   . Colon cancer Paternal Uncle   . Hyperlipidemia Brother   . Hyperlipidemia Maternal Grandmother   . Esophageal cancer Neg Hx   . Rectal cancer Neg Hx   . Stomach cancer Neg Hx     Social History   Socioeconomic History  . Marital status: Divorced    Spouse name: Not on file  . Number of children: 3  . Years of education:  college  . Highest education level: Not on file  Occupational History    Employer: UNEMPLOYED    Comment: Disabled  Social Needs  . Financial resource strain: Not on file  . Food insecurity:    Worry: Not on file    Inability: Not on file  . Transportation needs:    Medical: Not on file    Non-medical: Not on file  Tobacco Use  . Smoking status: Never Smoker  . Smokeless tobacco: Never Used  Substance and Sexual Activity  . Alcohol use: No    Alcohol/week: 0.0 standard drinks  . Drug use: No  . Sexual activity: Yes    Birth control/protection: None  Lifestyle  . Physical activity:    Days per week: Not on file    Minutes per session: Not on file  .  Stress: Not on file  Relationships  . Social connections:    Talks on phone: Not on file    Gets together: Not on file    Attends religious service: Not on file    Active member of club or organization: Not on file    Attends meetings of clubs or organizations: Not on file    Relationship status: Not on file  Other Topics Concern  . Not on file  Social History Narrative   Lives with boyfriend. Walks daily for 20 minutes. Education: [employer].     Review of Systems: A 12 point ROS discussed and pertinent positives are indicated in the HPI above.  All other systems are negative.  Review of Systems  Vital Signs: BP (!) 111/92   Pulse (!) 115   Temp 98.3 F (36.8 C) (Oral)   Resp (!) 26   Ht 5\' 2"  (1.575 m)   Wt 85.7 kg   SpO2 98%   BMI 34.57 kg/m   Physical Exam Constitutional:      Appearance: She is obese.  HENT:     Head: Normocephalic and atraumatic.  Eyes:     Extraocular Movements: Extraocular movements intact.  Cardiovascular:     Rate and Rhythm: Tachycardia present.  Pulmonary:     Effort: Pulmonary effort is normal. No respiratory distress.     Breath sounds: Normal breath sounds.  Abdominal:     Palpations: Abdomen is soft.     Tenderness: There is abdominal tenderness.  Musculoskeletal: Normal  range of motion.  Skin:    General: Skin is warm and dry.  Neurological:     General: No focal deficit present.     Mental Status: She is alert and oriented to person, place, and time.  Psychiatric:        Mood and Affect: Mood normal.        Behavior: Behavior normal.        Thought Content: Thought content normal.        Judgment: Judgment normal.     Imaging: Ct Abdomen Pelvis Wo Contrast  Result Date: 07/12/2018 CLINICAL DATA:  Lower abdominal pain. EXAM: CT ABDOMEN AND PELVIS WITHOUT CONTRAST TECHNIQUE: Multidetector CT imaging of the abdomen and pelvis was performed following the standard protocol without IV contrast. COMPARISON:  CT scan of May 15, 2018. FINDINGS: Lower chest: Visualized lung bases are unremarkable. Moderate size sliding-type hiatal hernia is noted. Hepatobiliary: Status post cholecystectomy. Probable fatty infiltration of the liver. No biliary dilatation is noted. Pancreas: Unremarkable. No pancreatic ductal dilatation or surrounding inflammatory changes. Spleen: Normal in size without focal abnormality. Adrenals/Urinary Tract: Adrenal glands are unremarkable. Kidneys are normal, without renal calculi, focal lesion, or hydronephrosis. Bladder is unremarkable. Stomach/Bowel: The stomach and appendix are unremarkable. There is no evidence of bowel obstruction. Worsening distal sigmoid diverticulitis is noted with adjacent 6.5 x 3.9 cm air collection with small fluid level, consistent with contained perforation and developing abscess. The perforation has a very wide neck measuring 1.4 cm. Vascular/Lymphatic: Aortic atherosclerosis. No enlarged abdominal or pelvic lymph nodes. Reproductive: Status post hysterectomy. No adnexal masses. Other: No abdominal wall hernia or abnormality. No abdominopelvic ascites. Musculoskeletal: No acute or significant osseous findings. IMPRESSION: Distal sigmoid diverticulitis is noted which is significantly progressed compared to prior exam,  with adjacent 6.5 x 3.9 cm air-fluid collection consistent with contained perforation and developing abscess. The perforation has a wide opening in the bowel wall, measuring 1.4 cm at least. Hepatic steatosis. Moderate size sliding-type  hiatal hernia. Aortic Atherosclerosis (ICD10-I70.0). Electronically Signed   By: Marijo Conception, M.D.   On: 07/12/2018 08:24    Labs:  CBC: Recent Labs    03/15/18 0441 03/16/18 0433 03/18/18 0418 07/12/18 0640  WBC 11.8* 10.3 5.0 9.4  HGB 11.4* 11.5* 11.6* 12.9  HCT 33.7* 35.2* 33.8* 39.1  PLT 203 214 189 251    COAGS: Recent Labs    03/14/18 2147  INR 1.02  APTT 30    BMP: Recent Labs    03/16/18 0433 03/17/18 0417 03/18/18 0418 07/12/18 0640  NA 141 138 141 136  K 3.2* 3.4* 3.5 3.7  CL 110 107 109 103  CO2 22 22 24 24   GLUCOSE 84 117* 111* 101*  BUN 9 7 7  28*  CALCIUM 8.8* 8.7* 9.1 10.8*  CREATININE 0.98 0.85 0.87 1.24*  GFRNONAA >60 >60 >60 49*  GFRAA >60 >60 >60 56*    LIVER FUNCTION TESTS: Recent Labs    03/14/18 2147 07/12/18 0640  BILITOT 1.1 1.0  AST 28 20  ALT 22 15  ALKPHOS 78 64  PROT 7.5 6.8  ALBUMIN 4.2 4.1    TUMOR MARKERS: No results for input(s): AFPTM, CEA, CA199, CHROMGRNA in the last 8760 hours.  Assessment and Plan:  Perforated diverticulitis with abscess formation in the pelvis.  Images reviewed by Dr. Vernard Gambles.  Will proceed with image guided placement of a drain today.  Risks and benefits discussed with the patient including bleeding, infection, damage to adjacent structures, bowel perforation/fistula connection, and sepsis.  All of the patient's questions were answered, patient is agreeable to proceed. Consent signed and in chart.  Thank you for this interesting consult.  I greatly enjoyed meeting Laurian Edrington and look forward to participating in their care.  A copy of this report was sent to the requesting provider on this date.  Electronically Signed: Murrell Redden, PA-C     07/12/2018, 1:04 PM      I spent a total of 40 Minutes in face to face in clinical consultation, greater than 50% of which was counseling/coordinating care for pelvic drain.

## 2018-07-12 NOTE — ED Provider Notes (Signed)
[facility] DEPT Provider Note   CSN: [id number] Arrival date & time: 07/12/18  0609     History   Chief Complaint Chief Complaint  Patient presents with  . Abdominal Pain    HPI Tamara [name] is a 58 y.o. female.  HPI   She presents for evaluation of suspected diverticulitis, with onset of abdominal pain about 2:30 AM, suddenly.  This pain reminds her of when she had diverticulitis in September 2019.  At that time she was treated with oral Cipro and Flagyl.  She saw her GI doctor again in November with recurrence of symptoms and received another course of antibiotics.  She subsequently had a CT scan that showed improving diverticulitis.  She has been on a strict low fiber diet but recently liberalized it somewhat.  She is concerned that the increased fiber is causing her symptoms.  She denies fever, chills, nausea, weakness or dizziness.  No recent cough, shortness of breath, weakness or dizziness.  There are no other known modifying factors.  Past Medical History:  Diagnosis Date  . Allergy   . Anemia   . Anxiety   . Asthma   . Depression   . Diverticulitis   . GERD (gastroesophageal reflux disease)   . History of short term memory loss   . HTN (hypertension)   . Hyperlipemia   . Neuromuscular disorder ([place])   . PTSD (post-traumatic stress disorder)   . Shock therapy as cause of abnormal reaction of patient or of later complication without mention of misadventure at time of procedure     Patient Active Problem List   Diagnosis Date Noted  . PTSD (post-traumatic stress disorder) 07/12/2018  . Diverticulitis of colon with perforation 03/15/2018  . CKD (chronic kidney disease), stage III ([place]) 03/15/2018  . Sepsis ([place]) 03/14/2018  . Diverticulosis of colon without hemorrhage 11/09/2016  . Multinodular non-toxic goiter 02/16/2015  . Migraine 11/05/2013  . Stroke ([place]) 10/22/2013  . ASTHMA 08/01/2010  . Hyperlipidemia 09/17/2007  .  OBESITY 09/17/2007  . Anxiety 09/17/2007  . Depression 09/17/2007  . Essential hypertension 09/17/2007  . GERD 09/17/2007    Past Surgical History:  Procedure Laterality Date  . ABDOMINAL HYSTERECTOMY     partial  . CHOLECYSTECTOMY    . COLONOSCOPY    . OTHER SURGICAL HISTORY     electric shock therapy  . TUBAL LIGATION    . UPPER GASTROINTESTINAL ENDOSCOPY       OB History   No obstetric history on file.      Home Medications    Prior to Admission medications   Medication Sig Start Date End Date Taking? Authorizing Provider  ALPRAZolam (XANAX XR) 1 MG 24 hr tablet Take 2 mg by mouth at bedtime.    Yes [provider]  ALPRAZolam Duanne Moron) 1 MG tablet Take 0.5-1 mg by mouth 2 (two) times daily as needed for anxiety.   Yes [provider]  aspirin 325 MG tablet Take 1 tablet (325 mg total) by mouth daily. 10/22/13  Yes Bernadene Bell, MD  Cholecalciferol (VITAMIN D3) 50000 UNITS CAPS Take 50,000 Units by mouth once a week. Patient taking differently: Take 50,000 Units by mouth every Saturday.  09/24/14  Yes [name], MD  dexlansoprazole (DEXILANT) 60 MG capsule Take 1 capsule (60 mg total) by mouth daily. 09/02/14  Yes [name], MD  doxycycline (VIBRAMYCIN) 100 MG capsule Take 100 mg by mouth daily.    Yes [provider]  fenofibrate (TRICOR) 145 MG tablet Take 1 tablet (145 mg total) by mouth daily. 05/22/16  Yes Wardell Honour, MD  losartan (COZAAR) 100 MG tablet Take 1 tablet (100 mg total) by mouth daily. 09/07/15  Yes [name], MD  LYSINE PO Take 1 tablet by mouth as needed. skin   Yes [provider]  Oxcarbazepine (TRILEPTAL) 300 MG tablet Take 300 mg by mouth daily.   Yes [provider]  temazepam (RESTORIL) 15 MG capsule Take 15 mg by mouth at bedtime.  01/16/17  Yes [provider]  temazepam (RESTORIL) 30 MG capsule Take 30 mg by mouth at bedtime.   Yes [provider]  Vilazodone  HCl (VIIBRYD) 10 MG TABS Take 10 mg by mouth daily.   Yes [provider]  Vilazodone HCl (VIIBRYD) 20 MG TABS Take 20 mg by mouth daily.   Yes [provider]  albuterol (PROVENTIL HFA;VENTOLIN HFA) 108 (90 Base) MCG/ACT inhaler Inhale 2 puffs into the lungs every 6 (six) hours as needed for wheezing or shortness of breath. 07/05/15   Leandrew Koyanagi, MD  ciprofloxacin (CIPRO) 500 MG tablet Take 1 tablet (500 mg total) by mouth 2 (two) times daily. Patient not taking: Reported on 07/12/2018 05/05/18   Irene Shipper, MD  metroNIDAZOLE (FLAGYL) 500 MG tablet Take 1 tablet (500 mg total) by mouth 2 (two) times daily. Patient not taking: Reported on 07/12/2018 05/05/18   Irene Shipper, MD  nitroGLYCERIN (NITROSTAT) 0.4 MG SL tablet Place 1 tablet (0.4 mg total) under the tongue every 5 (five) minutes as needed for chest pain. 02/22/17   Croitoru, Mihai, MD  ranitidine (ZANTAC) 150 MG tablet Take 1 tablet (150 mg total) by mouth at bedtime. Patient not taking: Reported on 07/12/2018 06/18/18   Irene Shipper, MD    Family History Family History  Problem Relation Age of Onset  . Arrhythmia Father   . Stroke Father   . Hyperlipidemia Father   . Heart disease Father   . Hypertension Father   . COPD Father   . Coronary artery disease Maternal Grandfather 40       Died 58  . Heart disease Maternal Grandfather   . Hypertension Maternal Grandfather   . Cancer Mother        bone marrow  . Hyperlipidemia Mother   . Hypertension Mother   . Hearing loss Paternal Grandmother   . Hearing loss Paternal Grandfather   . Diabetes Paternal Grandfather   . Diabetes Daughter   . Colon cancer Paternal Uncle   . Hyperlipidemia Brother   . Hyperlipidemia Maternal Grandmother   . Esophageal cancer Neg Hx   . Rectal cancer Neg Hx   . Stomach cancer Neg Hx     Social History Social History   Tobacco Use  . Smoking status: Never Smoker  . Smokeless tobacco: Never Used  Substance Use  Topics  . Alcohol use: No    Alcohol/week: 0.0 standard drinks  . Drug use: No     Allergies   Benadryl [diphenhydramine]; Darvon [propoxyphene]; Dilaudid [hydromorphone hcl]; Gabapentin; Ivp dye [iodinated diagnostic agents]; Levaquin [levofloxacin]; Morphine and related; Penicillins; Propoxyphene hcl; Rocephin [ceftriaxone]; Seroquel [quetiapine fumerate]; Statins; Sulfonamide derivatives; Adhesive [tape]; Dicyclomine; Other; Oxycodone; and Oxycontin [oxycodone hcl]   Review of Systems Review of Systems  All other systems reviewed and are negative.    Physical Exam Updated Vital Signs BP 115/79   Pulse (!) 115   Temp (!) 100.8 F (38.2  C) (Oral)   Resp 20   Ht 5\' 2"  (1.575 m)   Wt 85.7 kg   SpO2 95%   BMI 34.57 kg/m   Physical Exam Vitals signs and nursing note reviewed.  Constitutional:      Appearance: She is well-developed. She is obese. She is not ill-appearing or diaphoretic.  HENT:     Head: Normocephalic and atraumatic.     Right Ear: External ear normal.     Left Ear: External ear normal.  Eyes:     Conjunctiva/sclera: Conjunctivae normal.     Pupils: Pupils are equal, round, and reactive to light.  Neck:     Musculoskeletal: Normal range of motion and neck supple.     Trachea: Phonation normal.  Cardiovascular:     Rate and Rhythm: Normal rate and regular rhythm.     Heart sounds: Normal heart sounds.  Pulmonary:     Effort: Pulmonary effort is normal.     Breath sounds: Normal breath sounds.  Abdominal:     General: There is no distension.     Palpations: Abdomen is soft. There is no mass.     Tenderness: There is abdominal tenderness (Bilateral lower quadrants, mild).     Hernia: No hernia is present.  Musculoskeletal: Normal range of motion.  Skin:    General: Skin is warm and dry.  Neurological:     Mental Status: She is alert and oriented to person, place, and time.     Cranial Nerves: No cranial nerve deficit.     Sensory: No sensory  deficit.     Motor: No abnormal muscle tone.     Coordination: Coordination normal.  Psychiatric:        Behavior: Behavior normal.        Thought Content: Thought content normal.        Judgment: Judgment normal.      ED Treatments / Results  Labs (all labs ordered are listed, but only abnormal results are displayed) Labs Reviewed  LIPASE, BLOOD - Abnormal; Notable for the following components:      Result Value   Lipase 56 (*)    All other components within normal limits  COMPREHENSIVE METABOLIC PANEL - Abnormal; Notable for the following components:   Glucose, Bld 101 (*)    BUN 28 (*)    Creatinine, Ser 1.24 (*)    Calcium 10.8 (*)    GFR calc non Af Amer 49 (*)    GFR calc Af Amer 56 (*)    All other components within normal limits  URINALYSIS, ROUTINE W REFLEX MICROSCOPIC - Abnormal; Notable for the following components:   APPearance HAZY (*)    All other components within normal limits  AEROBIC/ANAEROBIC CULTURE (SURGICAL/DEEP WOUND)  CBC  LACTIC ACID, PLASMA  LACTIC ACID, PLASMA  BASIC METABOLIC PANEL  CBC    EKG None  Radiology Ct Abdomen Pelvis Wo Contrast  Result Date: 07/12/2018 CLINICAL DATA:  Lower abdominal pain. EXAM: CT ABDOMEN AND PELVIS WITHOUT CONTRAST TECHNIQUE: Multidetector CT imaging of the abdomen and pelvis was performed following the standard protocol without IV contrast. COMPARISON:  CT scan of May 15, 2018. FINDINGS: Lower chest: Visualized lung bases are unremarkable. Moderate size sliding-type hiatal hernia is noted. Hepatobiliary: Status post cholecystectomy. Probable fatty infiltration of the liver. No biliary dilatation is noted. Pancreas: Unremarkable. No pancreatic ductal dilatation or surrounding inflammatory changes. Spleen: Normal in size without focal abnormality. Adrenals/Urinary Tract: Adrenal glands are unremarkable. Kidneys are normal, without renal  calculi, focal lesion, or hydronephrosis. Bladder is unremarkable.  Stomach/Bowel: The stomach and appendix are unremarkable. There is no evidence of bowel obstruction. Worsening distal sigmoid diverticulitis is noted with adjacent 6.5 x 3.9 cm air collection with small fluid level, consistent with contained perforation and developing abscess. The perforation has a very wide neck measuring 1.4 cm. Vascular/Lymphatic: Aortic atherosclerosis. No enlarged abdominal or pelvic lymph nodes. Reproductive: Status post hysterectomy. No adnexal masses. Other: No abdominal wall hernia or abnormality. No abdominopelvic ascites. Musculoskeletal: No acute or significant osseous findings. IMPRESSION: Distal sigmoid diverticulitis is noted which is significantly progressed compared to prior exam, with adjacent 6.5 x 3.9 cm air-fluid collection consistent with contained perforation and developing abscess. The perforation has a wide opening in the bowel wall, measuring 1.4 cm at least. Hepatic steatosis. Moderate size sliding-type hiatal hernia. Aortic Atherosclerosis (ICD10-I70.0). Electronically Signed   By: Marijo Conception, M.D.   On: 07/12/2018 08:24   Ct Image Guided Drainage By Percutaneous Catheter  Result Date: 07/12/2018 CLINICAL DATA:  Diverticular abscess.  Drainage requested. EXAM: CT GUIDED DRAINAGE OF RIGHT PELVIC ABSCESS ANESTHESIA/SEDATION: Intravenous Fentanyl and Versed were administered as conscious sedation during continuous monitoring of the patient's level of consciousness and physiological / cardiorespiratory status by the radiology RN, with a total moderate sedation time of 30 minutes. PROCEDURE: The procedure, risks, benefits, and alternatives were explained to the patient. Questions regarding the procedure were encouraged and answered. The patient understands and consents to the procedure. Patient placed prone. Select axial scans through the pelvis were obtained. The gas and fluid collection was localized and an appropriate skin entry site was determined and marked. The  operative field was prepped with chlorhexidinein a sterile fashion, and a sterile drape was applied covering the operative field. A sterile gown and sterile gloves were used for the procedure. Local anesthesia was provided with 1% Lidocaine. Under CT fluoroscopic guidance, an 18 gauge trocar needle was advanced into the collection. Gas and thin cloudy fluid could be aspirated. A guidewire advanced easily within the collection, its position confirmed on CT. Tract dilated to facilitate placement of a 12 French pigtail drain catheter, formed centrally within the collection. CT confirms appropriate catheter positioning. Catheter secured externally with 0 Prolene suture and StatLock and placed to gravity drain bag. A sample of the aspirate was sent for Gram stain and culture. The patient tolerated the procedure well. COMPLICATIONS: None immediate FINDINGS: Gas and fluid collection in the deep right pelvis was again localized. Transgluteal 12 French pigtail drain catheter placed as above. Sample of the aspirate sent for Gram stain and culture. IMPRESSION: 1. Technically successful CT-guided right pelvic abscess drain catheter placement Electronically Signed   By: Lucrezia Europe M.D.   On: 07/12/2018 14:58    Procedures .Critical Care Performed by: Daleen Bo, MD Authorized by: Daleen Bo, MD   Critical care provider statement:    Critical care time (minutes):  35   Critical care start time:  07/12/2018 7:02 AM   Critical care end time:  07/12/2018 10:51 AM   Critical care time was exclusive of:  Separately billable procedures and treating other patients   Critical care was time spent personally by me on the following activities:  Blood draw for specimens, development of treatment plan with patient or surrogate, discussions with consultants, evaluation of patient's response to treatment, examination of patient, obtaining history from patient or surrogate, ordering and performing treatments and interventions,  ordering and review of laboratory studies, pulse oximetry, re-evaluation of  patient's condition, review of old charts and ordering and review of radiographic studies   (including critical care time)  Medications Ordered in ED Medications  0.9 %  sodium chloride infusion ( Intravenous Transfusing/Transfer 07/12/18 1515)  ALPRAZolam (XANAX XR) 24 hr tablet 2 mg (has no administration in time range)  ALPRAZolam (XANAX) tablet 0.5-1 mg (has no administration in time range)  temazepam (RESTORIL) capsule 15 mg (has no administration in time range)  temazepam (RESTORIL) capsule 30 mg (has no administration in time range)  Vilazodone HCl (VIIBRYD) TABS 10 mg (10 mg Oral Given 07/12/18 1622)  Vilazodone HCl TABS 20 mg (has no administration in time range)  Oxcarbazepine (TRILEPTAL) tablet 300 mg (300 mg Oral Given 07/12/18 1622)  albuterol (PROVENTIL) (2.5 MG/3ML) 0.083% nebulizer solution 3 mL (has no administration in time range)  acetaminophen (TYLENOL) tablet 650 mg (has no administration in time range)    Or  acetaminophen (TYLENOL) suppository 650 mg (has no administration in time range)  ondansetron (ZOFRAN) tablet 4 mg (has no administration in time range)    Or  ondansetron (ZOFRAN) injection 4 mg (has no administration in time range)  HYDROmorphone (DILAUDID) injection 1 mg (1 mg Intravenous Given 07/12/18 1623)  ciprofloxacin (CIPRO) IVPB 400 mg (has no administration in time range)  metroNIDAZOLE (FLAGYL) IVPB 500 mg (has no administration in time range)  sodium chloride flush (NS) 0.9 % injection 5 mL (5 mLs Intracatheter Not Given 07/12/18 1629)  ondansetron (ZOFRAN) injection 4 mg (4 mg Intravenous Given 07/12/18 0752)  sodium chloride 0.9 % bolus 1,000 mL (0 mLs Intravenous Stopped 07/12/18 1030)  ketamine 50 mg in normal saline 5 mL (10 mg/mL) syringe (26 mg Intravenous Given 07/12/18 0752)  ciprofloxacin (CIPRO) IVPB 400 mg (0 mg Intravenous Stopped 07/12/18 1307)  metroNIDAZOLE (FLAGYL)  IVPB 500 mg (500 mg Intravenous New Bag/Given 07/12/18 1308)  midazolam (VERSED) 2 MG/2ML injection (  Duplicate 12/01/07 3235)  fentaNYL (SUBLIMAZE) 100 MCG/2ML injection (  Duplicate 5/73/22 0254)  ondansetron (ZOFRAN) 4 MG/2ML injection (  Duplicate 2/70/62 3762)  midazolam (VERSED) injection (0.5 mg Intravenous Given 07/12/18 1419)  fentaNYL (SUBLIMAZE) injection (25 mcg Intravenous Given 07/12/18 1425)  ondansetron (ZOFRAN) injection (4 mg Intravenous Given 07/12/18 1413)     Initial Impression / Assessment and Plan / ED Course  I have reviewed the triage vital signs and the nursing notes.  Pertinent labs & imaging results that were available during my care of the patient were reviewed by me and considered in my medical decision making (see chart for details).  Clinical Course as of Jul 12 1708  Sat Jul 12, 2018  1012 Normal  Urinalysis, Routine w reflex microscopic(!) [EW]  1012 Mild elevation  Lipase, blood(!) [EW]  1012 Normal  CBC [EW]  1012 Normal except glucose high, BUN high, creatinine high, calcium 9  Comprehensive metabolic panel(!) [EW]  8315 Abnormal, sigmoid diverticulitis with possible perforation and colon defect and associated abscess.  Images reviewed by me  CT Abdomen Pelvis Wo Contrast [EW]  1761 Requested callback from general surgery to assist in care.  Patient has diverticulitis with perforation, will require drainage procedure and possibly partial colectomy.   [EW]  71 Callback from general surgery who will see as a Optometrist.  Dr. Lucia Gaskins requests that hospitalist admit the patient and the patient have a percutaneous drainage performed by interventional radiology.  He suspects she will require surgery either during this hospitalization or later.   [EW]    Clinical Course User  Index [EW] Daleen Bo, MD     Patient Vitals for the past 24 hrs:  BP Temp Temp src Pulse Resp SpO2 Height Weight  07/12/18 1513 115/79 (!) 100.8 F (38.2 C) Oral (!) 115 -  95 % - -  07/12/18 1440 104/77 - - (!) 119 20 96 % - -  07/12/18 1425 (!) 96/59 - - (!) 113 (!) 23 98 % - -  07/12/18 1420 107/62 - - (!) 112 20 99 % - -  07/12/18 1415 (!) 105/57 - - (!) 115 20 99 % - -  07/12/18 1356 (!) 125/99 - - (!) 111 14 - - -  07/12/18 1300 (!) 111/92 - - (!) 115 (!) 26 98 % - -  07/12/18 1230 (!) 132/95 - - (!) 114 (!) 24 97 % - -  07/12/18 1130 (!) 143/113 - - (!) 114 (!) 21 98 % - -  07/12/18 1115 (!) 133/99 - - (!) 115 (!) 27 99 % - -  07/12/18 1100 (!) 131/96 - - (!) 114 (!) 22 100 % - -  07/12/18 1045 (!) 131/99 - - (!) 116 (!) 26 98 % - -  07/12/18 1030 110/68 - - (!) 113 (!) 29 98 % - -  07/12/18 0900 (!) 136/96 - - (!) 111 15 97 % - -  07/12/18 0815 (!) 141/92 - - 97 15 100 % - -  07/12/18 0800 (!) 152/109 - - 97 16 100 % - -  07/12/18 0732 (!) 125/93 - - 90 19 99 % - -  07/12/18 0720 (!) 125/93 - - 100 19 99 % - -  07/12/18 0623 124/87 98.3 F (36.8 C) Oral 86 11 99 % 5\' 2"  (1.575 m) 85.7 kg      Medical Decision Making:   Patient Vitals for the past 24 hrs:  BP Temp Temp src Pulse Resp SpO2 Height Weight  07/12/18 1513 115/79 (!) 100.8 F (38.2 C) Oral (!) 115 - 95 % - -  07/12/18 1440 104/77 - - (!) 119 20 96 % - -  07/12/18 1425 (!) 96/59 - - (!) 113 (!) 23 98 % - -  07/12/18 1420 107/62 - - (!) 112 20 99 % - -  07/12/18 1415 (!) 105/57 - - (!) 115 20 99 % - -  07/12/18 1356 (!) 125/99 - - (!) 111 14 - - -  07/12/18 1300 (!) 111/92 - - (!) 115 (!) 26 98 % - -  07/12/18 1230 (!) 132/95 - - (!) 114 (!) 24 97 % - -  07/12/18 1130 (!) 143/113 - - (!) 114 (!) 21 98 % - -  07/12/18 1115 (!) 133/99 - - (!) 115 (!) 27 99 % - -  07/12/18 1100 (!) 131/96 - - (!) 114 (!) 22 100 % - -  07/12/18 1045 (!) 131/99 - - (!) 116 (!) 26 98 % - -  07/12/18 1030 110/68 - - (!) 113 (!) 29 98 % - -  07/12/18 0900 (!) 136/96 - - (!) 111 15 97 % - -  07/12/18 0815 (!) 141/92 - - 97 15 100 % - -  07/12/18 0800 (!) 152/109 - - 97 16 100 % - -  07/12/18 0732 (!)  125/93 - - 90 19 99 % - -  07/12/18 0720 (!) 125/93 - - 100 19 99 % - -  07/12/18 0623 124/87 98.3 F (36.8 C) Oral 86 11 99 % 5\' 2"  (1.575  m) 85.7 kg    10:52 AM Reevaluation with update and discussion. After initial assessment and treatment, an updated evaluation reveals she states her pain is better after ketamine, but has returned somewhat.  Findings discussed and questions answered. Daleen Bo   Medical Decision Making: Perforated sigmoid diverticulitis, with large abscess.  Parenteral antibiotics begun.  Patient's pain improved.  Case discussed with surgery who will see as a Optometrist.  Hospitalist contacted to admit patient.  Doubt sepsis, metabolic instability or impending vascular collapse.  CRITICAL CARE-yes Performed by: Daleen Bo   Nursing Notes Reviewed/ Care Coordinated Applicable Imaging Reviewed Interpretation of Laboratory Data incorporated into ED treatment  10:54 AM-Consult complete with hospitalist. Patient case explained and discussed.  He agrees to admit patient for further evaluation and treatment. Call ended at 11:05 AM  Plan: Admit    Final Clinical Impressions(s) / ED Diagnoses   Final diagnoses:  Diverticulitis of colon  Perforated intestine [facility])    ED Discharge Orders    None       Daleen Bo, MD 07/12/18 1711

## 2018-07-12 NOTE — ED Notes (Signed)
Bed: GX41 Expected date:  Expected time:  Means of arrival:  Comments: EMS 85 F left lower abd pain-hx diverticulitis

## 2018-07-12 NOTE — Progress Notes (Signed)
Pt having uncontrolled vomiting. Zofran not effective. Doesn't want Phenergan with dilaudid. Pt thinks may be related to the dilaudid day shift nurse gave at [facility]. MD notified for another antiemetic. Will continue to monitor.

## 2018-07-13 DIAGNOSIS — L0291 Cutaneous abscess, unspecified: Secondary | ICD-10-CM

## 2018-07-13 DIAGNOSIS — K5732 Diverticulitis of large intestine without perforation or abscess without bleeding: Secondary | ICD-10-CM

## 2018-07-13 DIAGNOSIS — I10 Essential (primary) hypertension: Secondary | ICD-10-CM

## 2018-07-13 DIAGNOSIS — F419 Anxiety disorder, unspecified: Secondary | ICD-10-CM

## 2018-07-13 DIAGNOSIS — K5720 Diverticulitis of large intestine with perforation and abscess without bleeding: Principal | ICD-10-CM

## 2018-07-13 DIAGNOSIS — F4310 Post-traumatic stress disorder, unspecified: Secondary | ICD-10-CM

## 2018-07-13 DIAGNOSIS — E785 Hyperlipidemia, unspecified: Secondary | ICD-10-CM

## 2018-07-13 DIAGNOSIS — F329 Major depressive disorder, single episode, unspecified: Secondary | ICD-10-CM

## 2018-07-13 DIAGNOSIS — E669 Obesity, unspecified: Secondary | ICD-10-CM

## 2018-07-13 LAB — CBC
HCT: 35.8 % — ABNORMAL LOW (ref 36.0–46.0)
Hemoglobin: 11.4 g/dL — ABNORMAL LOW (ref 12.0–15.0)
MCH: 27.9 pg (ref 26.0–34.0)
MCHC: 31.8 g/dL (ref 30.0–36.0)
MCV: 87.7 fL (ref 80.0–100.0)
Platelets: 192 10*3/uL (ref 150–400)
RBC: 4.08 MIL/uL (ref 3.87–5.11)
RDW: 12.9 % (ref 11.5–15.5)
WBC: 6.5 10*3/uL (ref 4.0–10.5)
nRBC: 0 % (ref 0.0–0.2)

## 2018-07-13 LAB — BASIC METABOLIC PANEL
Anion gap: 7 (ref 5–15)
BUN: 16 mg/dL (ref 6–20)
CHLORIDE: 105 mmol/L (ref 98–111)
CO2: 26 mmol/L (ref 22–32)
Calcium: 9 mg/dL (ref 8.9–10.3)
Creatinine, Ser: 1 mg/dL (ref 0.44–1.00)
GFR calc Af Amer: >60 mL/min (ref >60)
GFR calc non Af Amer: >60 mL/min (ref >60)
Glucose, Bld: 100 mg/dL — ABNORMAL HIGH (ref 70–99)
POTASSIUM: 3.7 mmol/L (ref 3.5–5.1)
Sodium: 138 mmol/L (ref 135–145)

## 2018-07-13 MED ORDER — KETOROLAC TROMETHAMINE 30 MG/ML IJ SOLN: 30.0000 mg | Freq: Four times a day (QID) | INTRAMUSCULAR | Status: DC | PRN

## 2018-07-13 MED ORDER — HYDROMORPHONE HCL 1 MG/ML IJ SOLN
0.5000 mg | INTRAMUSCULAR | Status: DC | PRN
  Administered 2018-07-14 – 2018-07-16 (×3): 0.5 mg via INTRAVENOUS
  Filled 2018-07-13 (×3): qty 0.5

## 2018-07-13 MED ORDER — METHOCARBAMOL 500 MG PO TABS
1000.0000 mg | ORAL_TABLET | Freq: Four times a day (QID) | ORAL | Status: DC | PRN
  Administered 2018-07-15: 1000 mg via ORAL
  Filled 2018-07-13: qty 2

## 2018-07-13 MED ORDER — ACETAMINOPHEN 500 MG PO TABS
1000.0000 mg | ORAL_TABLET | Freq: Four times a day (QID) | ORAL | Status: DC | PRN
  Administered 2018-07-13 – 2018-07-17 (×5): 1000 mg via ORAL
  Filled 2018-07-13 (×6): qty 2

## 2018-07-13 MED ORDER — HYDROXYZINE HCL 25 MG PO TABS: 25.0000 mg | ORAL_TABLET | Freq: Three times a day (TID) | ORAL | Status: DC | PRN

## 2018-07-13 MED ORDER — SODIUM CHLORIDE 0.9 % IV BOLUS
500.0000 mL | Freq: Once | INTRAVENOUS | Status: AC
  Administered 2018-07-13: 500 mL via INTRAVENOUS

## 2018-07-13 MED ORDER — KETOROLAC TROMETHAMINE 30 MG/ML IJ SOLN: 15.0000 mg | Freq: Four times a day (QID) | INTRAMUSCULAR | Status: DC | PRN

## 2018-07-13 MED ORDER — METHOCARBAMOL 1000 MG/10ML IJ SOLN
1000.0000 mg | Freq: Four times a day (QID) | INTRAVENOUS | Status: DC | PRN
  Filled 2018-07-13: qty 10

## 2018-07-13 MED ORDER — HYDROMORPHONE HCL 1 MG/ML IJ SOLN: 1.0000 mg | INTRAMUSCULAR | Status: DC | PRN

## 2018-07-13 MED ORDER — PROCHLORPERAZINE EDISYLATE 10 MG/2ML IJ SOLN
10.0000 mg | Freq: Four times a day (QID) | INTRAMUSCULAR | Status: DC | PRN
  Administered 2018-07-13 – 2018-07-15 (×3): 10 mg via INTRAVENOUS
  Filled 2018-07-13 (×3): qty 2

## 2018-07-13 MED ORDER — PANTOPRAZOLE SODIUM 40 MG PO TBEC: 40.0000 mg | DELAYED_RELEASE_TABLET | Freq: Every day | ORAL | Status: DC

## 2018-07-13 MED ORDER — ACETAMINOPHEN 500 MG PO TABS: 1000.0000 mg | ORAL_TABLET | Freq: Three times a day (TID) | ORAL | Status: DC

## 2018-07-13 MED ORDER — PANTOPRAZOLE SODIUM 40 MG PO TBEC
40.0000 mg | DELAYED_RELEASE_TABLET | Freq: Two times a day (BID) | ORAL | Status: DC
  Administered 2018-07-15: 40 mg via ORAL
  Filled 2018-07-13 (×3): qty 1

## 2018-07-13 NOTE — Progress Notes (Signed)
[facility]:  [phone] General Surgery Progress Note   LOS: 1 day  POD -     Chief Complaint: Abdominal pain  Assessment and Plan: 1.  Perforated diverticulitis  Perc drain - 07/12/2018  Flagyl/Cirpo - 1/11 >>>  Looks better -   2. Depression/Anxiety  Sees Dr. Casimiro Needle 3. GERD with moderate sized HH 4. HTN 5. PTSD 6. Obesity - BMI 34.5 7.  Atherosclerosis 8.  DVT prophylaxis - start lovenox   Principal Problem:   Diverticulitis of colon with perforation Active Problems:   Hyperlipidemia   OBESITY   Anxiety   Depression   Essential hypertension   CKD (chronic kidney disease), stage III (HCC)   PTSD (post-traumatic stress disorder)   Subjective:  A little better.  Nausea early this AM, which is better.  Objective:   Vitals:   07/13/18 0558 07/13/18 0820  BP: 90/61 100/80  Pulse: 83   Resp: 18   Temp: 97.9 F (36.6 C)   SpO2: 98%      Intake/Output from previous day:  01/11 0701 - 01/12 0700 In: 2069.4 [I.V.:769.4; IV Piggyback:1300] Out: 51 [Urine:800; Drains:10]  Intake/Output this shift:  No intake/output data recorded.   Physical Exam:   General: WN WF who is alert and oriented.    HEENT: Normal. Pupils equal. .   Lungs: Clear   Abdomen: Soft, but still lower abdominal tenderness  Drain - only 10 cc so far   Lab Results:    Recent Labs    07/12/18 0640 07/13/18 0543  WBC 9.4 6.5  HGB 12.9 11.4*  HCT 39.1 35.8*  PLT 251 192    BMET   Recent Labs    07/12/18 0640 07/13/18 0543  NA 136 138  K 3.7 3.7  CL 103 105  CO2 24 26  GLUCOSE 101* 100*  BUN 28* 16  CREATININE 1.24* 1.00  CALCIUM 10.8* 9.0    PT/INR  No results for input(s): LABPROT, INR in the last 72 hours.  ABG  No results for input(s): PHART, HCO3 in the last 72 hours.  Invalid input(s): PCO2, PO2   Studies/Results:  Ct Abdomen Pelvis Wo Contrast  Result Date: 07/12/2018 CLINICAL DATA:  Lower abdominal pain. EXAM: CT ABDOMEN AND  PELVIS WITHOUT CONTRAST TECHNIQUE: Multidetector CT imaging of the abdomen and pelvis was performed following the standard protocol without IV contrast. COMPARISON:  CT scan of May 15, 2018. FINDINGS: Lower chest: Visualized lung bases are unremarkable. Moderate size sliding-type hiatal hernia is noted. Hepatobiliary: Status post cholecystectomy. Probable fatty infiltration of the liver. No biliary dilatation is noted. Pancreas: Unremarkable. No pancreatic ductal dilatation or surrounding inflammatory changes. Spleen: Normal in size without focal abnormality. Adrenals/Urinary Tract: Adrenal glands are unremarkable. Kidneys are normal, without renal calculi, focal lesion, or hydronephrosis. Bladder is unremarkable. Stomach/Bowel: The stomach and appendix are unremarkable. There is no evidence of bowel obstruction. Worsening distal sigmoid diverticulitis is noted with adjacent 6.5 x 3.9 cm air collection with small fluid level, consistent with contained perforation and developing abscess. The perforation has a very wide neck measuring 1.4 cm. Vascular/Lymphatic: Aortic atherosclerosis. No enlarged abdominal or pelvic lymph nodes. Reproductive: Status post hysterectomy. No adnexal masses. Other: No abdominal wall hernia or abnormality. No abdominopelvic ascites. Musculoskeletal: No acute or significant osseous findings. IMPRESSION: Distal sigmoid diverticulitis is noted which is significantly progressed compared to prior exam, with adjacent 6.5 x 3.9 cm air-fluid collection consistent with contained perforation and developing abscess. The perforation has a wide opening  in the bowel wall, measuring 1.4 cm at least. Hepatic steatosis. Moderate size sliding-type hiatal hernia. Aortic Atherosclerosis (ICD10-I70.0). Electronically Signed   By: Marijo Conception, M.D.   On: 07/12/2018 08:24   Ct Image Guided Drainage By Percutaneous Catheter  Result Date: 07/12/2018 CLINICAL DATA:  Diverticular abscess.  Drainage  requested. EXAM: CT GUIDED DRAINAGE OF RIGHT PELVIC ABSCESS ANESTHESIA/SEDATION: Intravenous Fentanyl and Versed were administered as conscious sedation during continuous monitoring of the patient's level of consciousness and physiological / cardiorespiratory status by the radiology RN, with a total moderate sedation time of 30 minutes. PROCEDURE: The procedure, risks, benefits, and alternatives were explained to the patient. Questions regarding the procedure were encouraged and answered. The patient understands and consents to the procedure. Patient placed prone. Select axial scans through the pelvis were obtained. The gas and fluid collection was localized and an appropriate skin entry site was determined and marked. The operative field was prepped with chlorhexidinein a sterile fashion, and a sterile drape was applied covering the operative field. A sterile gown and sterile gloves were used for the procedure. Local anesthesia was provided with 1% Lidocaine. Under CT fluoroscopic guidance, an 18 gauge trocar needle was advanced into the collection. Gas and thin cloudy fluid could be aspirated. A guidewire advanced easily within the collection, its position confirmed on CT. Tract dilated to facilitate placement of a 12 French pigtail drain catheter, formed centrally within the collection. CT confirms appropriate catheter positioning. Catheter secured externally with 0 Prolene suture and StatLock and placed to gravity drain bag. A sample of the aspirate was sent for Gram stain and culture. The patient tolerated the procedure well. COMPLICATIONS: None immediate FINDINGS: Gas and fluid collection in the deep right pelvis was again localized. Transgluteal 12 French pigtail drain catheter placed as above. Sample of the aspirate sent for Gram stain and culture. IMPRESSION: 1. Technically successful CT-guided right pelvic abscess drain catheter placement Electronically Signed   By: Lucrezia Europe M.D.   On: 07/12/2018 14:58      Anti-infectives:   Anti-infectives (From admission, onward)   Start     Dose/Rate Route Frequency Ordered Stop   07/12/18 2200  ciprofloxacin (CIPRO) IVPB 400 mg     400 mg 200 mL/hr over 60 Minutes Intravenous Every 12 hours 07/12/18 1511     07/12/18 2100  metroNIDAZOLE (FLAGYL) IVPB 500 mg     500 mg 100 mL/hr over 60 Minutes Intravenous Every 8 hours 07/12/18 1511     07/12/18 1030  ciprofloxacin (CIPRO) IVPB 400 mg     400 mg 200 mL/hr over 60 Minutes Intravenous  Once 07/12/18 1017 07/12/18 1307   07/12/18 1030  metroNIDAZOLE (FLAGYL) IVPB 500 mg     500 mg 100 mL/hr over 60 Minutes Intravenous  Once 07/12/18 1017 07/12/18 1408      Alphonsa Overall, MD, FACS Pager: [facility]: [phone] 07/13/2018

## 2018-07-13 NOTE — Progress Notes (Signed)
PROGRESS NOTE    Tamara [name]  DTO:[id number] DOB: 1961/06/30 DOA: 07/12/2018 PCP: Jilda Panda, MD    Brief Narrative:  Patient is a 58 year old female prior history of diverticulitis with perforation complicated with sepsis during recent hospitalization December 2019, anxiety/PTSD, morbid obesity, GERD presenting to the ED with abdominal pain.  Repeat CT abdomen and pelvis concerning for diverticulitis with contained perforation and developing abscess.  Patient admitted placed empirically on IV ciprofloxacin and IV Flagyl.  General surgery consulted and are following.  IR consulted and patient status post CT-guided right pelvic abscess drain placement 07/12/2018.   Assessment & Plan:   Principal Problem:   Diverticulitis of colon with perforation Active Problems:   Hyperlipidemia   OBESITY   Anxiety   Depression   Essential hypertension   CKD (chronic kidney disease), stage III (HCC)   PTSD (post-traumatic stress disorder)  1 acute diverticulitis with contained perforation with developing abscess Clinically improving slowly.  Findings noted on CT abdomen and pelvis 07/12/2018.  General surgery and IR consulted.  Patient status post percutaneous drain placement per IR 07/12/2018 with cultures pending.  Continue IV ciprofloxacin, IV Flagyl, pain management.  Discontinue Zofran and placed on IV Compazine for nausea as patient states Zofran is not helping.  Per general surgery will likely need repeat CT abdomen and pelvis done in a few days to follow-up on perforation and abscess.  General surgery following and appreciate input and recommendations.  2.  Depression/anxiety/PTSD Stable.  Continue home regimen of Trileptal, and Viibryd.  Xanax nightly.  Outpatient follow-up.  3.  Gastroesophageal reflux disease with moderate sized hiatal hernia Place on a PPI.  Resume home regimen of Zantac.  4.  Hypertension Patient noted to have borderline blood pressure with systolics in the 98P.   Repeat blood pressure manually.  Give a fluid bolus.  Continue IV fluids.  Home regimen of Cozaar.  5.  Hyperlipidemia Resume TriCor on discharge.  6.  Obesity   DVT prophylaxis: SCDs Code Status: Full Family Communication: Updated patient.  No family at bedside. Disposition Plan: Home when clinically improved and per general surgery.   Consultants:   General surgery: Dr. Lucia Gaskins 07/12/2018  Procedures:   CT image guided right pelvic abscess drain placement per Dr. Vernard Gambles 07/12/2018  CT abdomen and pelvis 07/12/2018  Antimicrobials:   IV ciprofloxacin 07/12/2018  IV Flagyl 07/12/2018   Subjective: Patient laying in bed.  Denies any emesis.  Had some nausea earlier on.  Patient still with lower abdominal pain however improving since admission.  Drain in place.  Objective: Vitals:   07/12/18 2120 07/13/18 0558 07/13/18 0820 07/13/18 1354  BP: 110/76 90/61 100/80 108/76  Pulse: 99 83  82  Resp: 19 18    Temp: 97.9 F (36.6 C) 97.9 F (36.6 C)  99.3 F (37.4 C)  TempSrc: Oral Oral  Oral  SpO2: 100% 98%  98%  Weight:      Height:        Intake/Output Summary (Last 24 hours) at 07/13/2018 1514 Last data filed at 07/13/2018 1300 Gross per 24 hour  Intake 1069.35 ml  Output 1405 ml  Net -335.65 ml   Filed Weights   07/12/18 0623 07/12/18 1513  Weight: 85.7 kg 87.3 kg    Examination:  General exam: Appears calm and comfortable  Respiratory system: Clear to auscultation. Respiratory effort normal. Cardiovascular system: S1 & S2 heard, RRR. No JVD, murmurs, rubs, gallops or clicks. No pedal edema. Gastrointestinal system: Abdomen is nondistended, soft  and tender to palpation in the left lower quadrant, right lower quadrant and suprapubic region.  Positive bowel sounds.  Drain in place.  Central nervous system: Alert and oriented. No focal neurological deficits. Extremities: Symmetric 5 x 5 power. Skin: No rashes, lesions or ulcers Psychiatry: Judgement and insight  appear normal. Mood & affect appropriate.     Data Reviewed: I have personally reviewed following labs and imaging studies  CBC: Recent Labs  Lab 07/12/18 0640 07/13/18 0543  WBC 9.4 6.5  HGB 12.9 11.4*  HCT 39.1 35.8*  MCV 86.5 87.7  PLT 251 244   Basic Metabolic Panel: Recent Labs  Lab 07/12/18 0640 07/13/18 0543  NA 136 138  K 3.7 3.7  CL 103 105  CO2 24 26  GLUCOSE 101* 100*  BUN 28* 16  CREATININE 1.24* 1.00  CALCIUM 10.8* 9.0   GFR: Estimated Creatinine Clearance: 64.5 mL/min (by C-G formula based on SCr of 1 mg/dL). Liver Function Tests: Recent Labs  Lab 07/12/18 0640  AST 20  ALT 15  ALKPHOS 64  BILITOT 1.0  PROT 6.8  ALBUMIN 4.1   Recent Labs  Lab 07/12/18 0640  LIPASE 56*   No results for input(s): AMMONIA in the last 168 hours. Coagulation Profile: No results for input(s): INR, PROTIME in the last 168 hours. Cardiac Enzymes: No results for input(s): CKTOTAL, CKMB, CKMBINDEX, TROPONINI in the last 168 hours. BNP (last 3 results) No results for input(s): PROBNP in the last 8760 hours. HbA1C: No results for input(s): HGBA1C in the last 72 hours. CBG: No results for input(s): GLUCAP in the last 168 hours. Lipid Profile: No results for input(s): CHOL, HDL, LDLCALC, TRIG, CHOLHDL, LDLDIRECT in the last 72 hours. Thyroid Function Tests: No results for input(s): TSH, T4TOTAL, FREET4, T3FREE, THYROIDAB in the last 72 hours. Anemia Panel: No results for input(s): VITAMINB12, FOLATE, FERRITIN, TIBC, IRON, RETICCTPCT in the last 72 hours. Sepsis Labs: Recent Labs  Lab 07/12/18 1519 07/12/18 1801  LATICACIDVEN 1.6 1.3    Recent Results (from the past 240 hour(s))  Aerobic/Anaerobic Culture (surgical/deep wound)     Status: None (Preliminary result)   Collection Time: 07/12/18  2:39 PM  Result Value Ref Range Status   Specimen Description   Final    JP DRAINAGE Performed at [facility] [address] [place],  [place] [address]    Special Requests   Final    NONE Performed at [facility], [place] [address], [place], [place] [address]    Gram Stain   Final    ABUNDANT WBC PRESENT, PREDOMINANTLY PMN RARE GRAM POSITIVE COCCI RARE GRAM NEGATIVE RODS Performed at [facility], [place] [address] [place], [place] [address]    Culture MODERATE GRAM NEGATIVE RODS  Final   Report Status PENDING  Incomplete         Radiology Studies: Ct Abdomen Pelvis Wo Contrast  Result Date: 07/12/2018 CLINICAL DATA:  Lower abdominal pain. EXAM: CT ABDOMEN AND PELVIS WITHOUT CONTRAST TECHNIQUE: Multidetector CT imaging of the abdomen and pelvis was performed following the standard protocol without IV contrast. COMPARISON:  CT scan of May 15, 2018. FINDINGS: Lower chest: Visualized lung bases are unremarkable. Moderate size sliding-type hiatal hernia is noted. Hepatobiliary: Status post cholecystectomy. Probable fatty infiltration of the liver. No biliary dilatation is noted. Pancreas: Unremarkable. No pancreatic ductal dilatation or surrounding inflammatory changes. Spleen: Normal in size without focal abnormality. Adrenals/Urinary Tract: Adrenal glands are unremarkable. Kidneys are normal, without  renal calculi, focal lesion, or hydronephrosis. Bladder is unremarkable. Stomach/Bowel: The stomach and appendix are unremarkable. There is no evidence of bowel obstruction. Worsening distal sigmoid diverticulitis is noted with adjacent 6.5 x 3.9 cm air collection with small fluid level, consistent with contained perforation and developing abscess. The perforation has a very wide neck measuring 1.4 cm. Vascular/Lymphatic: Aortic atherosclerosis. No enlarged abdominal or pelvic lymph nodes. Reproductive: Status post hysterectomy. No adnexal masses. Other: No abdominal wall hernia or abnormality. No abdominopelvic ascites. Musculoskeletal: No acute or significant osseous findings. IMPRESSION: Distal sigmoid  diverticulitis is noted which is significantly progressed compared to prior exam, with adjacent 6.5 x 3.9 cm air-fluid collection consistent with contained perforation and developing abscess. The perforation has a wide opening in the bowel wall, measuring 1.4 cm at least. Hepatic steatosis. Moderate size sliding-type hiatal hernia. Aortic Atherosclerosis (ICD10-I70.0). Electronically Signed   By: Marijo Conception, M.D.   On: 07/12/2018 08:24   Ct Image Guided Drainage By Percutaneous Catheter  Result Date: 07/12/2018 CLINICAL DATA:  Diverticular abscess.  Drainage requested. EXAM: CT GUIDED DRAINAGE OF RIGHT PELVIC ABSCESS ANESTHESIA/SEDATION: Intravenous Fentanyl and Versed were administered as conscious sedation during continuous monitoring of the patient's level of consciousness and physiological / cardiorespiratory status by the radiology RN, with a total moderate sedation time of 30 minutes. PROCEDURE: The procedure, risks, benefits, and alternatives were explained to the patient. Questions regarding the procedure were encouraged and answered. The patient understands and consents to the procedure. Patient placed prone. Select axial scans through the pelvis were obtained. The gas and fluid collection was localized and an appropriate skin entry site was determined and marked. The operative field was prepped with chlorhexidinein a sterile fashion, and a sterile drape was applied covering the operative field. A sterile gown and sterile gloves were used for the procedure. Local anesthesia was provided with 1% Lidocaine. Under CT fluoroscopic guidance, an 18 gauge trocar needle was advanced into the collection. Gas and thin cloudy fluid could be aspirated. A guidewire advanced easily within the collection, its position confirmed on CT. Tract dilated to facilitate placement of a 12 French pigtail drain catheter, formed centrally within the collection. CT confirms appropriate catheter positioning. Catheter secured  externally with 0 Prolene suture and StatLock and placed to gravity drain bag. A sample of the aspirate was sent for Gram stain and culture. The patient tolerated the procedure well. COMPLICATIONS: None immediate FINDINGS: Gas and fluid collection in the deep right pelvis was again localized. Transgluteal 12 French pigtail drain catheter placed as above. Sample of the aspirate sent for Gram stain and culture. IMPRESSION: 1. Technically successful CT-guided right pelvic abscess drain catheter placement Electronically Signed   By: Lucrezia Europe M.D.   On: 07/12/2018 14:58        Scheduled Meds: . ALPRAZolam  2 mg Oral QHS  . Oxcarbazepine  300 mg Oral Daily  . sodium chloride flush  5 mL Intracatheter Q8H  . temazepam  15 mg Oral QHS  . temazepam  30 mg Oral QHS  . Vilazodone HCl  10 mg Oral Daily  . Vilazodone HCl  20 mg Oral Daily   Continuous Infusions: . sodium chloride Stopped (07/12/18 2303)  . ciprofloxacin 400 mg (07/13/18 1038)  . methocarbamol (ROBAXIN) IV    . metronidazole 500 mg (07/13/18 0600)     LOS: 1 day    Time spent: 35 minutes    Irine Seal, MD [facility]  If 7PM-7AM, please contact night-coverage [email or web address]  07/13/2018, 3:14 PM

## 2018-07-14 ENCOUNTER — Other Ambulatory Visit: Payer: Self-pay

## 2018-07-14 LAB — CBC WITH DIFFERENTIAL/PLATELET
Abs Immature Granulocytes: 0.04 10*3/uL (ref 0.00–0.07)
Basophils Absolute: 0.1 10*3/uL (ref 0.0–0.1)
Basophils Relative: 1 %
Eosinophils Absolute: 0.2 10*3/uL (ref 0.0–0.5)
Eosinophils Relative: 4 %
HCT: 33.2 % — ABNORMAL LOW (ref 36.0–46.0)
Hemoglobin: 10.8 g/dL — ABNORMAL LOW (ref 12.0–15.0)
Immature Granulocytes: 1 %
Lymphocytes Relative: 23 %
Lymphs Abs: 1.2 10*3/uL (ref 0.7–4.0)
MCH: 28.9 pg (ref 26.0–34.0)
MCHC: 32.5 g/dL (ref 30.0–36.0)
MCV: 88.8 fL (ref 80.0–100.0)
Monocytes Absolute: 0.4 10*3/uL (ref 0.1–1.0)
Monocytes Relative: 8 %
Neutro Abs: 3.5 10*3/uL (ref 1.7–7.7)
Neutrophils Relative %: 63 %
Platelets: 179 10*3/uL (ref 150–400)
RBC: 3.74 MIL/uL — ABNORMAL LOW (ref 3.87–5.11)
RDW: 12.3 % (ref 11.5–15.5)
WBC: 5.4 10*3/uL (ref 4.0–10.5)
nRBC: 0 % (ref 0.0–0.2)

## 2018-07-14 LAB — BASIC METABOLIC PANEL
Anion gap: 10 (ref 5–15)
BUN: 12 mg/dL (ref 6–20)
CO2: 22 mmol/L (ref 22–32)
Calcium: 8.8 mg/dL — ABNORMAL LOW (ref 8.9–10.3)
Chloride: 107 mmol/L (ref 98–111)
Creatinine, Ser: 0.94 mg/dL (ref 0.44–1.00)
GFR calc Af Amer: >60 mL/min (ref >60)
GFR calc non Af Amer: >60 mL/min (ref >60)
Glucose, Bld: 84 mg/dL (ref 70–99)
Potassium: 3.6 mmol/L (ref 3.5–5.1)
SODIUM: 139 mmol/L (ref 135–145)

## 2018-07-14 MED ORDER — SODIUM CHLORIDE 0.9% FLUSH
10.0000 mL | INTRAVENOUS | Status: DC | PRN
  Administered 2018-07-16: 20 mL
  Filled 2018-07-14: qty 40

## 2018-07-14 MED ORDER — ENOXAPARIN SODIUM 40 MG/0.4ML ~~LOC~~ SOLN
40.0000 mg | SUBCUTANEOUS | Status: DC
  Administered 2018-07-14 – 2018-07-17 (×4): 40 mg via SUBCUTANEOUS
  Filled 2018-07-14 (×4): qty 0.4

## 2018-07-14 MED ORDER — SODIUM CHLORIDE 0.9% FLUSH
10.0000 mL | Freq: Two times a day (BID) | INTRAVENOUS | Status: DC
  Administered 2018-07-15 – 2018-07-16 (×3): 10 mL

## 2018-07-14 MED ORDER — CEFAZOLIN SODIUM-DEXTROSE 2-4 GM/100ML-% IV SOLN
2.0000 g | Freq: Three times a day (TID) | INTRAVENOUS | Status: DC
  Administered 2018-07-14 – 2018-07-17 (×10): 2 g via INTRAVENOUS
  Filled 2018-07-14 (×12): qty 100

## 2018-07-14 NOTE — Progress Notes (Signed)
Patient's IV was leaking. Paged IV team. IV nurse could not get peripheral IV after 3 attempts. Paged MD and midline order was obtained verbally.

## 2018-07-14 NOTE — Progress Notes (Signed)
Referring Physician(s): [place]  Supervising Physician: Daryll Brod  Patient Status:  Tamara [name] - In-pt  Chief Complaint:  Diverticular abscess  Subjective: Pt doing ok; asking when IV going to be placed; has some soreness at transgluteal drain site as expected; has ambulated   Allergies: Benadryl [diphenhydramine]; Darvon [propoxyphene]; Dilaudid [hydromorphone hcl]; Gabapentin; Ivp dye [iodinated diagnostic agents]; Levaquin [levofloxacin]; Morphine and related; Penicillins; Propoxyphene hcl; Rocephin [ceftriaxone]; Seroquel [quetiapine fumerate]; Statins; Sulfonamide derivatives; Adhesive [tape]; Dicyclomine; Other; Oxycodone; and Oxycontin [oxycodone hcl]  Medications: Prior to Admission medications   Medication Sig Start Date End Date Taking? Authorizing Provider  ALPRAZolam (XANAX XR) 1 MG 24 hr tablet Take 2 mg by mouth at bedtime.    Yes [provider]  ALPRAZolam Duanne Moron) 1 MG tablet Take 0.5-1 mg by mouth 2 (two) times daily as needed for anxiety.   Yes [provider]  aspirin 325 MG tablet Take 1 tablet (325 mg total) by mouth daily. 10/22/13  Yes Bernadene Bell, MD  Cholecalciferol (VITAMIN D3) 50000 UNITS CAPS Take 50,000 Units by mouth once a week. Patient taking differently: Take 50,000 Units by mouth every Saturday.  09/24/14  Yes [name], MD  dexlansoprazole (DEXILANT) 60 MG capsule Take 1 capsule (60 mg total) by mouth daily. 09/02/14  Yes [name], MD  doxycycline (VIBRAMYCIN) 100 MG capsule Take 100 mg by mouth daily.    Yes [provider]  fenofibrate (TRICOR) 145 MG tablet Take 1 tablet (145 mg total) by mouth daily. 05/22/16  Yes Wardell Honour, MD  losartan (COZAAR) 100 MG tablet Take 1 tablet (100 mg total) by mouth daily. 09/07/15  Yes [name], MD  LYSINE PO Take 1 tablet by mouth as needed. skin   Yes [provider]  Oxcarbazepine (TRILEPTAL) 300 MG tablet Take 300 mg by mouth daily.   Yes  [provider]  temazepam (RESTORIL) 15 MG capsule Take 15 mg by mouth at bedtime.  01/16/17  Yes [provider]  temazepam (RESTORIL) 30 MG capsule Take 30 mg by mouth at bedtime.   Yes [provider]  Vilazodone HCl (VIIBRYD) 10 MG TABS Take 10 mg by mouth daily.   Yes [provider]  Vilazodone HCl (VIIBRYD) 20 MG TABS Take 20 mg by mouth daily.   Yes [provider]  albuterol (PROVENTIL HFA;VENTOLIN HFA) 108 (90 Base) MCG/ACT inhaler Inhale 2 puffs into the lungs every 6 (six) hours as needed for wheezing or shortness of breath. 07/05/15   Leandrew Koyanagi, MD  ciprofloxacin (CIPRO) 500 MG tablet Take 1 tablet (500 mg total) by mouth 2 (two) times daily. Patient not taking: Reported on 07/12/2018 05/05/18   Irene Shipper, MD  metroNIDAZOLE (FLAGYL) 500 MG tablet Take 1 tablet (500 mg total) by mouth 2 (two) times daily. Patient not taking: Reported on 07/12/2018 05/05/18   Irene Shipper, MD  nitroGLYCERIN (NITROSTAT) 0.4 MG SL tablet Place 1 tablet (0.4 mg total) under the tongue every 5 (five) minutes as needed for chest pain. 02/22/17   Croitoru, Mihai, MD  ranitidine (ZANTAC) 150 MG tablet Take 1 tablet (150 mg total) by mouth at bedtime. Patient not taking: Reported on 07/12/2018 06/18/18   Irene Shipper, MD     Vital Signs: BP 132/90 (BP Location: Right Arm)   Pulse 92   Temp 98.7 F (37.1 C)   Resp 18   Ht 5\' 2"  (1.575 m)   Wt 192 lb 8 oz (87.3  kg)   SpO2 99%   BMI 35.21 kg/m   Physical Exam awake/alert; rt TG drain intact, output 35 cc blood-tinged fluid; cx- e coli prelim; site mildly tender to palpation  Imaging: Ct Abdomen Pelvis Wo Contrast  Result Date: 07/12/2018 CLINICAL DATA:  Lower abdominal pain. EXAM: CT ABDOMEN AND PELVIS WITHOUT CONTRAST TECHNIQUE: Multidetector CT imaging of the abdomen and pelvis was performed following the standard protocol without IV contrast. COMPARISON:  CT scan of May 15, 2018. FINDINGS:  Lower chest: Visualized lung bases are unremarkable. Moderate size sliding-type hiatal hernia is noted. Hepatobiliary: Status post cholecystectomy. Probable fatty infiltration of the liver. No biliary dilatation is noted. Pancreas: Unremarkable. No pancreatic ductal dilatation or surrounding inflammatory changes. Spleen: Normal in size without focal abnormality. Adrenals/Urinary Tract: Adrenal glands are unremarkable. Kidneys are normal, without renal calculi, focal lesion, or hydronephrosis. Bladder is unremarkable. Stomach/Bowel: The stomach and appendix are unremarkable. There is no evidence of bowel obstruction. Worsening distal sigmoid diverticulitis is noted with adjacent 6.5 x 3.9 cm air collection with small fluid level, consistent with contained perforation and developing abscess. The perforation has a very wide neck measuring 1.4 cm. Vascular/Lymphatic: Aortic atherosclerosis. No enlarged abdominal or pelvic lymph nodes. Reproductive: Status post hysterectomy. No adnexal masses. Other: No abdominal wall hernia or abnormality. No abdominopelvic ascites. Musculoskeletal: No acute or significant osseous findings. IMPRESSION: Distal sigmoid diverticulitis is noted which is significantly progressed compared to prior exam, with adjacent 6.5 x 3.9 cm air-fluid collection consistent with contained perforation and developing abscess. The perforation has a wide opening in the bowel wall, measuring 1.4 cm at least. Hepatic steatosis. Moderate size sliding-type hiatal hernia. Aortic Atherosclerosis (ICD10-I70.0). Electronically Signed   By: Marijo Conception, M.D.   On: 07/12/2018 08:24   Ct Image Guided Drainage By Percutaneous Catheter  Result Date: 07/12/2018 CLINICAL DATA:  Diverticular abscess.  Drainage requested. EXAM: CT GUIDED DRAINAGE OF RIGHT PELVIC ABSCESS ANESTHESIA/SEDATION: Intravenous Fentanyl and Versed were administered as conscious sedation during continuous monitoring of the patient's level of  consciousness and physiological / cardiorespiratory status by the radiology RN, with a total moderate sedation time of 30 minutes. PROCEDURE: The procedure, risks, benefits, and alternatives were explained to the patient. Questions regarding the procedure were encouraged and answered. The patient understands and consents to the procedure. Patient placed prone. Select axial scans through the pelvis were obtained. The gas and fluid collection was localized and an appropriate skin entry site was determined and marked. The operative field was prepped with chlorhexidinein a sterile fashion, and a sterile drape was applied covering the operative field. A sterile gown and sterile gloves were used for the procedure. Local anesthesia was provided with 1% Lidocaine. Under CT fluoroscopic guidance, an 18 gauge trocar needle was advanced into the collection. Gas and thin cloudy fluid could be aspirated. A guidewire advanced easily within the collection, its position confirmed on CT. Tract dilated to facilitate placement of a 12 French pigtail drain catheter, formed centrally within the collection. CT confirms appropriate catheter positioning. Catheter secured externally with 0 Prolene suture and StatLock and placed to gravity drain bag. A sample of the aspirate was sent for Gram stain and culture. The patient tolerated the procedure well. COMPLICATIONS: None immediate FINDINGS: Gas and fluid collection in the deep right pelvis was again localized. Transgluteal 12 French pigtail drain catheter placed as above. Sample of the aspirate sent for Gram stain and culture. IMPRESSION: 1. Technically successful CT-guided right pelvic abscess drain catheter placement Electronically Signed  By: Lucrezia Europe M.D.   On: 07/12/2018 14:58    Labs:  CBC: Recent Labs    03/18/18 0418 07/12/18 0640 07/13/18 0543 07/14/18 0613  WBC 5.0 9.4 6.5 5.4  HGB 11.6* 12.9 11.4* 10.8*  HCT 33.8* 39.1 35.8* 33.2*  PLT 189 251 192 179     COAGS: Recent Labs    03/14/18 2147  INR 1.02  APTT 30    BMP: Recent Labs    03/18/18 0418 07/12/18 0640 07/13/18 0543 07/14/18 0613  NA 141 136 138 139  K 3.5 3.7 3.7 3.6  CL 109 103 105 107  CO2 24 24 26 22   GLUCOSE 111* 101* 100* 84  BUN 7 28* 16 12  CALCIUM 9.1 10.8* 9.0 8.8*  CREATININE 0.87 1.24* 1.00 0.94  GFRNONAA >60 49* >60 >60  GFRAA >60 56* >60 >60    LIVER FUNCTION TESTS: Recent Labs    03/14/18 2147 07/12/18 0640  BILITOT 1.1 1.0  AST 28 20  ALT 22 15  ALKPHOS 78 64  PROT 7.5 6.8  ALBUMIN 4.2 4.1    Assessment and Plan: Pt with hx diverticular abscess , s/p drainage 07/12/18; afebrile; WBC nl; creat nl; hgb 10.8; drain fluid cx- e coli; cont with drain flushes/output monitoring; check f/u imaging within 1 week of placement   Electronically Signed: [name], PA-C 07/14/2018, 3:12 PM   I spent a total of 15 minutes at the the patient's bedside AND on the patient's hospital floor or unit, greater than 50% of which was counseling/coordinating care for pelvic abscess drain    Patient ID: Herbert Pun, female   DOB: 11-04-1960, 58 y.o.   MRN: [id number]

## 2018-07-14 NOTE — Progress Notes (Signed)
[facility] Progress Note     Subjective: CC-  Patient states that she feels much better than prior to admission. Sore from drain placement, but abdominal pain much better. Denies n/v. Passing flatus. Ambulating around room without issues. WBC 5.4, afebrile.  Objective: Vital signs in last 24 hours: Temp:  [98.1 F (36.7 C)-99.3 F (37.4 C)] 98.2 F (36.8 C) (01/13 0546) Pulse Rate:  [82-93] 93 (01/13 0546) Resp:  [17-18] 18 (01/13 0546) BP: (108-122)/(76-77) 117/76 (01/13 0546) SpO2:  [96 %-100 %] 96 % (01/13 0546) Last BM Date: 07/11/18  Intake/Output from previous day: 01/12 0701 - 01/13 0700 In: 15  Out: 3585 [Urine:3550; Drains:35] Intake/Output this shift: Total I/O In: 1350 [I.V.:550; IV Piggyback:800] Out: -   PE: Gen:  Alert, NAD, pleasant HEENT: EOM's intact, pupils equal and round Card:  RRR Pulm:  CTAB, no W/R/R, effort normal Abd: Soft, ND, +BS, no HSM, mild subjective lower abdominal TTP Psych: A&Ox3  Skin: no rashes noted, warm and dry  Lab Results:  Recent Labs    07/13/18 0543 07/14/18 0613  WBC 6.5 5.4  HGB 11.4* 10.8*  HCT 35.8* 33.2*  PLT 192 179   BMET Recent Labs    07/13/18 0543 07/14/18 0613  NA 138 139  K 3.7 3.6  CL 105 107  CO2 26 22  GLUCOSE 100* 84  BUN 16 12  CREATININE 1.00 0.94  CALCIUM 9.0 8.8*   PT/INR No results for input(s): LABPROT, INR in the last 72 hours. CMP     Component Value Date/Time   NA 139 07/14/2018 0613   K 3.6 07/14/2018 0613   CL 107 07/14/2018 0613   CO2 22 07/14/2018 0613   GLUCOSE 84 07/14/2018 0613   BUN 12 07/14/2018 0613   CREATININE 0.94 07/14/2018 0613   CREATININE 1.12 (H) 10/05/2015 1540   CALCIUM 8.8 (L) 07/14/2018 0613   CALCIUM 11.8 (H) 03/13/2010 2236   PROT 6.8 07/12/2018 0640   ALBUMIN 4.1 07/12/2018 0640   AST 20 07/12/2018 0640   ALT 15 07/12/2018 0640   ALKPHOS 64 07/12/2018 0640   BILITOT 1.0 07/12/2018 0640   GFRNONAA >60 07/14/2018 0613    GFRNONAA 56 (L) 10/05/2015 1540   GFRAA >60 07/14/2018 0613   GFRAA 65 10/05/2015 1540   Lipase     Component Value Date/Time   LIPASE 56 (H) 07/12/2018 0640       Studies/Results: Ct Image Guided Drainage By Percutaneous Catheter  Result Date: 07/12/2018 CLINICAL DATA:  Diverticular abscess.  Drainage requested. EXAM: CT GUIDED DRAINAGE OF RIGHT PELVIC ABSCESS ANESTHESIA/SEDATION: Intravenous Fentanyl and Versed were administered as conscious sedation during continuous monitoring of the patient's level of consciousness and physiological / cardiorespiratory status by the radiology RN, with a total moderate sedation time of 30 minutes. PROCEDURE: The procedure, risks, benefits, and alternatives were explained to the patient. Questions regarding the procedure were encouraged and answered. The patient understands and consents to the procedure. Patient placed prone. Select axial scans through the pelvis were obtained. The gas and fluid collection was localized and an appropriate skin entry site was determined and marked. The operative field was prepped with chlorhexidinein a sterile fashion, and a sterile drape was applied covering the operative field. A sterile gown and sterile gloves were used for the procedure. Local anesthesia was provided with 1% Lidocaine. Under CT fluoroscopic guidance, an 18 gauge trocar needle was advanced into the collection. Gas and thin cloudy fluid could be aspirated. A guidewire advanced  easily within the collection, its position confirmed on CT. Tract dilated to facilitate placement of a 12 French pigtail drain catheter, formed centrally within the collection. CT confirms appropriate catheter positioning. Catheter secured externally with 0 Prolene suture and StatLock and placed to gravity drain bag. A sample of the aspirate was sent for Gram stain and culture. The patient tolerated the procedure well. COMPLICATIONS: None immediate FINDINGS: Gas and fluid collection in the  deep right pelvis was again localized. Transgluteal 12 French pigtail drain catheter placed as above. Sample of the aspirate sent for Gram stain and culture. IMPRESSION: 1. Technically successful CT-guided right pelvic abscess drain catheter placement Electronically Signed   By: Lucrezia Europe M.D.   On: 07/12/2018 14:58    Anti-infectives: Anti-infectives (From admission, onward)   Start     Dose/Rate Route Frequency Ordered Stop   07/12/18 2200  ciprofloxacin (CIPRO) IVPB 400 mg     400 mg 200 mL/hr over 60 Minutes Intravenous Every 12 hours 07/12/18 1511     07/12/18 2100  metroNIDAZOLE (FLAGYL) IVPB 500 mg     500 mg 100 mL/hr over 60 Minutes Intravenous Every 8 hours 07/12/18 1511     07/12/18 1030  ciprofloxacin (CIPRO) IVPB 400 mg     400 mg 200 mL/hr over 60 Minutes Intravenous  Once 07/12/18 1017 07/12/18 1307   07/12/18 1030  metroNIDAZOLE (FLAGYL) IVPB 500 mg     500 mg 100 mL/hr over 60 Minutes Intravenous  Once 07/12/18 1017 07/12/18 1408       Assessment/Plan Depression/anxiety PTSD GERD Moderate sized hiatal hernia HTN Obesity - BMI 34.5  Perforated diverticulitis with abscess S/p IR drain 1/11 - gram stain growing gram negative rods, culture pending - WBC, afebrile, pain improved - Ok for clear liquids. Continue ambulating. Continue IV abx. Hopefully going to continue to improve without surgery. Labs in AM.  ID - cipro/flagyl 1/11>>day#3 FEN - IVF, CLD VTE - SCDs, lovenox Foley - none   LOS: 2 days    Wellington Hampshire , [facility] 07/14/2018, 9:17 AM Pager: [phone] Mon 7:00 am -11:30 AM Tues-Fri 7:00 am-4:30 pm Sat-Sun 7:00 am-11:30 am

## 2018-07-14 NOTE — Progress Notes (Signed)
First dose of Ancef IV given at 1553. RN will monitor patient closely for any anaphylaxis.

## 2018-07-14 NOTE — Progress Notes (Signed)
PROGRESS NOTE    Tamara [name]  WUJ:[id number] DOB: 1960-09-15 DOA: 07/12/2018 PCP: Jilda Panda, MD    Brief Narrative:  Patient is a 58 year old female prior history of diverticulitis with perforation complicated with sepsis during recent hospitalization December 2019, anxiety/PTSD, morbid obesity, GERD presenting to the ED with abdominal pain.  Repeat CT abdomen and pelvis concerning for diverticulitis with contained perforation and developing abscess.  Patient admitted placed empirically on IV ciprofloxacin and IV Flagyl.  General surgery consulted and are following.  IR consulted and patient status post CT-guided right pelvic abscess drain placement 07/12/2018.   Assessment & Plan:   Principal Problem:   Diverticulitis of colon Active Problems:   Hyperlipidemia   OBESITY   Anxiety   Depression   Essential hypertension   CKD (chronic kidney disease), stage III (HCC)   PTSD (post-traumatic stress disorder)   Abscess  1 acute diverticulitis with contained perforation with developing abscess Clinically improving slowly.  Findings noted on CT abdomen and pelvis 07/12/2018.  General surgery and IR consulted.  Patient status post percutaneous drain placement per IR 07/12/2018 with cultures positive for E. coli which is resistant to ampicillin and ciprofloxacin.  Discontinue IV ciprofloxacin and IV Flagyl and start patient on IV Ancef.  Continue IV Compazine as needed for nausea.  Patient being followed by general surgery and has been started on clear liquids.  Drain in place and IR following. General surgery following and appreciate input and recommendations.  2.  Depression/anxiety/PTSD Stable.  Continue home regimen of Trileptal, and Viibryd.  Xanax nightly.  Outpatient follow-up.  3.  Gastroesophageal reflux disease with moderate sized hiatal hernia Continue Protonix.  4.  Hypertension Patient noted to have borderline blood pressure with systolics in the 95A.  Repeat blood  pressure manually.  Continue to hold home regimen of Cozaar.  Continue IV fluids.  Follow.    5.  Hyperlipidemia Resume TriCor on discharge.  6.  Obesity   DVT prophylaxis: Lovenox Code Status: Full Family Communication: Updated patient.  No family at bedside. Disposition Plan: Home when clinically improved and per general surgery.   Consultants:   General surgery: Dr. Lucia Gaskins 07/12/2018  Procedures:   CT image guided right pelvic abscess drain placement per Dr. Vernard Gambles 07/12/2018  CT abdomen and pelvis 07/12/2018    Antimicrobials:   IV ciprofloxacin 07/12/2018>>>>>> 07/14/2018  IV Flagyl 07/12/2018>>>>> 07/14/2018  IV Ancef 07/14/2018   Subjective: Patient sitting up in bed.  Denies any emesis.  Nausea improving.  Abdominal pain improving.  Drain in place.   Objective: Vitals:   07/13/18 0820 07/13/18 1354 07/13/18 2132 07/14/18 0546  BP: 100/80 108/76 122/77 117/76  Pulse:  82 86 93  Resp:   17 18  Temp:  99.3 F (37.4 C) 98.1 F (36.7 C) 98.2 F (36.8 C)  TempSrc:  Oral Oral Oral  SpO2:  98% 100% 96%  Weight:      Height:        Intake/Output Summary (Last 24 hours) at 07/14/2018 0957 Last data filed at 07/14/2018 0916 Gross per 24 hour  Intake 1365 ml  Output 4185 ml  Net -2820 ml   Filed Weights   07/12/18 0623 07/12/18 1513  Weight: 85.7 kg 87.3 kg    Examination:  General exam: NAD Respiratory system: CTAB.  No wheezes, no crackles, no rhonchi. Respiratory effort normal. Cardiovascular system: Regular rate rhythm no murmurs rubs or gallops.  No JVD.  No lower extremity edema. Gastrointestinal system: Abdomen is soft, nondistended,  positive bowel sounds, some tenderness to palpation in the left lower quadrant, right lower quadrant and suprapubic region.  Positive bowel sounds.  Drain in place.  Central nervous system: Alert and oriented. No focal neurological deficits. Extremities: Symmetric 5 x 5 power. Skin: No rashes, lesions or  ulcers Psychiatry: Judgement and insight appear normal. Mood & affect appropriate.     Data Reviewed: I have personally reviewed following labs and imaging studies  CBC: Recent Labs  Lab 07/12/18 0640 07/13/18 0543 07/14/18 0613  WBC 9.4 6.5 5.4  NEUTROABS  --   --  3.5  HGB 12.9 11.4* 10.8*  HCT 39.1 35.8* 33.2*  MCV 86.5 87.7 88.8  PLT 251 192 732   Basic Metabolic Panel: Recent Labs  Lab 07/12/18 0640 07/13/18 0543 07/14/18 0613  NA 136 138 139  K 3.7 3.7 3.6  CL 103 105 107  CO2 24 26 22   GLUCOSE 101* 100* 84  BUN 28* 16 12  CREATININE 1.24* 1.00 0.94  CALCIUM 10.8* 9.0 8.8*   GFR: Estimated Creatinine Clearance: 68.6 mL/min (by C-G formula based on SCr of 0.94 mg/dL). Liver Function Tests: Recent Labs  Lab 07/12/18 0640  AST 20  ALT 15  ALKPHOS 64  BILITOT 1.0  PROT 6.8  ALBUMIN 4.1   Recent Labs  Lab 07/12/18 0640  LIPASE 56*   No results for input(s): AMMONIA in the last 168 hours. Coagulation Profile: No results for input(s): INR, PROTIME in the last 168 hours. Cardiac Enzymes: No results for input(s): CKTOTAL, CKMB, CKMBINDEX, TROPONINI in the last 168 hours. BNP (last 3 results) No results for input(s): PROBNP in the last 8760 hours. HbA1C: No results for input(s): HGBA1C in the last 72 hours. CBG: No results for input(s): GLUCAP in the last 168 hours. Lipid Profile: No results for input(s): CHOL, HDL, LDLCALC, TRIG, CHOLHDL, LDLDIRECT in the last 72 hours. Thyroid Function Tests: No results for input(s): TSH, T4TOTAL, FREET4, T3FREE, THYROIDAB in the last 72 hours. Anemia Panel: No results for input(s): VITAMINB12, FOLATE, FERRITIN, TIBC, IRON, RETICCTPCT in the last 72 hours. Sepsis Labs: Recent Labs  Lab 07/12/18 1519 07/12/18 1801  LATICACIDVEN 1.6 1.3    Recent Results (from the past 240 hour(s))  Aerobic/Anaerobic Culture (surgical/deep wound)     Status: None (Preliminary result)   Collection Time: 07/12/18  2:39 PM   Result Value Ref Range Status   Specimen Description   Final    JP DRAINAGE Performed at [facility] [address] [place], [place] [address]    Special Requests   Final    NONE Performed at [facility], [place] [address] [place], [place] [address]    Gram Stain   Final    ABUNDANT WBC PRESENT, PREDOMINANTLY PMN RARE GRAM POSITIVE COCCI RARE GRAM NEGATIVE RODS Performed at [facility], [place] [address] [place], [place] [address]    Culture MODERATE GRAM NEGATIVE RODS  Final   Report Status PENDING  Incomplete         Radiology Studies: Ct Image Guided Drainage By Percutaneous Catheter  Result Date: 07/12/2018 CLINICAL DATA:  Diverticular abscess.  Drainage requested. EXAM: CT GUIDED DRAINAGE OF RIGHT PELVIC ABSCESS ANESTHESIA/SEDATION: Intravenous Fentanyl and Versed were administered as conscious sedation during continuous monitoring of the patient's level of consciousness and physiological / cardiorespiratory status by the radiology RN, with a total moderate sedation time of 30 minutes. PROCEDURE: The procedure, risks, benefits, and alternatives were explained to the patient.  Questions regarding the procedure were encouraged and answered. The patient understands and consents to the procedure. Patient placed prone. Select axial scans through the pelvis were obtained. The gas and fluid collection was localized and an appropriate skin entry site was determined and marked. The operative field was prepped with chlorhexidinein a sterile fashion, and a sterile drape was applied covering the operative field. A sterile gown and sterile gloves were used for the procedure. Local anesthesia was provided with 1% Lidocaine. Under CT fluoroscopic guidance, an 18 gauge trocar needle was advanced into the collection. Gas and thin cloudy fluid could be aspirated. A guidewire advanced easily within the collection, its position confirmed on CT. Tract dilated  to facilitate placement of a 12 French pigtail drain catheter, formed centrally within the collection. CT confirms appropriate catheter positioning. Catheter secured externally with 0 Prolene suture and StatLock and placed to gravity drain bag. A sample of the aspirate was sent for Gram stain and culture. The patient tolerated the procedure well. COMPLICATIONS: None immediate FINDINGS: Gas and fluid collection in the deep right pelvis was again localized. Transgluteal 12 French pigtail drain catheter placed as above. Sample of the aspirate sent for Gram stain and culture. IMPRESSION: 1. Technically successful CT-guided right pelvic abscess drain catheter placement Electronically Signed   By: Lucrezia Europe M.D.   On: 07/12/2018 14:58        Scheduled Meds: . ALPRAZolam  2 mg Oral QHS  . enoxaparin (LOVENOX) injection  40 mg Subcutaneous Q24H  . Oxcarbazepine  300 mg Oral Daily  . pantoprazole  40 mg Oral BID AC  . sodium chloride flush  5 mL Intracatheter Q8H  . temazepam  15 mg Oral QHS  . temazepam  30 mg Oral QHS  . Vilazodone HCl  10 mg Oral Daily  . Vilazodone HCl  20 mg Oral Daily   Continuous Infusions: . sodium chloride 100 mL/hr at 07/14/18 0233  . ciprofloxacin 400 mg (07/14/18 0916)  . methocarbamol (ROBAXIN) IV    . metronidazole 500 mg (07/14/18 0437)     LOS: 2 days    Time spent: 35 minutes    Irine Seal, MD [facility]  If 7PM-7AM, please contact night-coverage [email or web address] 07/14/2018, 9:57 AM

## 2018-07-15 LAB — CBC
HCT: 34.4 % — ABNORMAL LOW (ref 36.0–46.0)
Hemoglobin: 11.1 g/dL — ABNORMAL LOW (ref 12.0–15.0)
MCH: 28.3 pg (ref 26.0–34.0)
MCHC: 32.3 g/dL (ref 30.0–36.0)
MCV: 87.8 fL (ref 80.0–100.0)
Platelets: 211 10*3/uL (ref 150–400)
RBC: 3.92 MIL/uL (ref 3.87–5.11)
RDW: 12.4 % (ref 11.5–15.5)
WBC: 5.5 10*3/uL (ref 4.0–10.5)
nRBC: 0 % (ref 0.0–0.2)

## 2018-07-15 LAB — BASIC METABOLIC PANEL
Anion gap: 9 (ref 5–15)
BUN: 8 mg/dL (ref 6–20)
CO2: 24 mmol/L (ref 22–32)
Calcium: 9.2 mg/dL (ref 8.9–10.3)
Chloride: 106 mmol/L (ref 98–111)
Creatinine, Ser: 0.97 mg/dL (ref 0.44–1.00)
GFR calc Af Amer: >60 mL/min (ref >60)
GFR calc non Af Amer: >60 mL/min (ref >60)
Glucose, Bld: 96 mg/dL (ref 70–99)
Potassium: 3.5 mmol/L (ref 3.5–5.1)
Sodium: 139 mmol/L (ref 135–145)

## 2018-07-15 MED ORDER — LOPERAMIDE HCL 2 MG PO CAPS
2.0000 mg | ORAL_CAPSULE | ORAL | Status: DC | PRN
  Administered 2018-07-15: 2 mg via ORAL
  Filled 2018-07-15: qty 1

## 2018-07-15 MED ORDER — LIDOCAINE VISCOUS HCL 2 % MT SOLN: 15.0000 mL | Freq: Four times a day (QID) | OROMUCOSAL | Status: DC | PRN

## 2018-07-15 MED ORDER — POTASSIUM CHLORIDE CRYS ER 20 MEQ PO TBCR
40.0000 meq | EXTENDED_RELEASE_TABLET | Freq: Once | ORAL | Status: AC
  Administered 2018-07-15: 40 meq via ORAL
  Filled 2018-07-15: qty 2

## 2018-07-15 MED ORDER — HYDROXYZINE HCL 25 MG PO TABS: 25.0000 mg | ORAL_TABLET | Freq: Three times a day (TID) | ORAL | Status: DC | PRN

## 2018-07-15 MED ORDER — LIP MEDEX EX OINT
TOPICAL_OINTMENT | CUTANEOUS | Status: DC | PRN
  Administered 2018-07-15: 05:00:00 via TOPICAL
  Filled 2018-07-15: qty 7

## 2018-07-15 MED ORDER — ALUM & MAG HYDROXIDE-SIMETH 200-200-20 MG/5ML PO SUSP
30.0000 mL | Freq: Four times a day (QID) | ORAL | Status: DC | PRN
  Administered 2018-07-15: 30 mL via ORAL
  Filled 2018-07-15: qty 30

## 2018-07-15 NOTE — Progress Notes (Signed)
[facility] Progress Note     Subjective: CC-  Patient ambulating in the room. States that she feels a little better each day. Loose BM this morning. Tolerating clear liquids without n/v or increased abdominal pain. WBC WNL, TMAX 100.  Objective: Vital signs in last 24 hours: Temp:  [98.4 F (36.9 C)-100 F (37.8 C)] 100 F (37.8 C) (01/14 0614) Pulse Rate:  [72-92] 86 (01/14 0614) Resp:  [18] 18 (01/14 0614) BP: (113-132)/(90-92) 123/92 (01/14 0614) SpO2:  [98 %-100 %] 98 % (01/14 0614) Last BM Date: 07/14/18  Intake/Output from previous day: 01/13 0701 - 01/14 0700 In: 3598.1 [P.O.:600; I.V.:1788.1; IV Piggyback:1200] Out: 6314 [Urine:3950; Drains:15] Intake/Output this shift: No intake/output data recorded.  PE: Gen:  Alert, NAD, pleasant HEENT: EOM's intact, pupils equal and round Card:  RRR Pulm:  CTAB, no W/R/R, effort normal Abd: Soft, ND, +BS, no HSM, nontender, drain with slightly cloudy SS drainage Psych: A&Ox3  Skin: no rashes noted, warm and dry  Lab Results:  Recent Labs    07/14/18 0613 07/15/18 0405  WBC 5.4 5.5  HGB 10.8* 11.1*  HCT 33.2* 34.4*  PLT 179 211   BMET Recent Labs    07/14/18 0613 07/15/18 0405  NA 139 139  K 3.6 3.5  CL 107 106  CO2 22 24  GLUCOSE 84 96  BUN 12 8  CREATININE 0.94 0.97  CALCIUM 8.8* 9.2   PT/INR No results for input(s): LABPROT, INR in the last 72 hours. CMP     Component Value Date/Time   NA 139 07/15/2018 0405   K 3.5 07/15/2018 0405   CL 106 07/15/2018 0405   CO2 24 07/15/2018 0405   GLUCOSE 96 07/15/2018 0405   BUN 8 07/15/2018 0405   CREATININE 0.97 07/15/2018 0405   CREATININE 1.12 (H) 10/05/2015 1540   CALCIUM 9.2 07/15/2018 0405   CALCIUM 11.8 (H) 03/13/2010 2236   PROT 6.8 07/12/2018 0640   ALBUMIN 4.1 07/12/2018 0640   AST 20 07/12/2018 0640   ALT 15 07/12/2018 0640   ALKPHOS 64 07/12/2018 0640   BILITOT 1.0 07/12/2018 0640   GFRNONAA >60 07/15/2018 0405   GFRNONAA  56 (L) 10/05/2015 1540   GFRAA >60 07/15/2018 0405   GFRAA 65 10/05/2015 1540   Lipase     Component Value Date/Time   LIPASE 56 (H) 07/12/2018 0640       Studies/Results: No results found.  Anti-infectives: Anti-infectives (From admission, onward)   Start     Dose/Rate Route Frequency Ordered Stop   07/14/18 1500  ceFAZolin (ANCEF) IVPB 2g/100 mL premix     2 g 200 mL/hr over 30 Minutes Intravenous Every 8 hours 07/14/18 1426     07/12/18 2200  ciprofloxacin (CIPRO) IVPB 400 mg  Status:  Discontinued     400 mg 200 mL/hr over 60 Minutes Intravenous Every 12 hours 07/12/18 1511 07/14/18 1426   07/12/18 2100  metroNIDAZOLE (FLAGYL) IVPB 500 mg  Status:  Discontinued     500 mg 100 mL/hr over 60 Minutes Intravenous Every 8 hours 07/12/18 1511 07/14/18 1426   07/12/18 1030  ciprofloxacin (CIPRO) IVPB 400 mg     400 mg 200 mL/hr over 60 Minutes Intravenous  Once 07/12/18 1017 07/12/18 1307   07/12/18 1030  metroNIDAZOLE (FLAGYL) IVPB 500 mg     500 mg 100 mL/hr over 60 Minutes Intravenous  Once 07/12/18 1017 07/12/18 1408       Assessment/Plan Depression/anxiety PTSD GERD Moderate sized hiatal  hernia HTN Obesity - BMI 34.5  Perforated diverticulitis with abscess S/p IR drain 1/11 - culture grew E coli, antibiotics changed to ancef 1/13 - WBC, pain improved, tolerating clears and started having bowel function today - Advance to full liquids. Continue drain and IV antibiotics. Continue ambulating.   ID - cipro/flagyl 1/11>>1/13, ancef 1/13>> FEN - IVF, FLD VTE - SCDs, lovenox Foley - none   LOS: 3 days    Wellington Hampshire , [facility] 07/15/2018, 8:41 AM Pager: [phone] Mon 7:00 am -11:30 AM Tues-Fri 7:00 am-4:30 pm Sat-Sun 7:00 am-11:30 am

## 2018-07-15 NOTE — Care Management Important Message (Signed)
Important Message  Patient Details  Name: Tamara [name] MRN: [id number] Date of Birth: 13-Feb-1961   Medicare Important Message Given:  Yes    Kerin Salen 07/15/2018, 11:18 AMImportant Message  Patient Details  Name: Tamara [name] MRN: [id number] Date of Birth: Mar 27, 1961   Medicare Important Message Given:  Yes    Kerin Salen 07/15/2018, 11:18 AM

## 2018-07-15 NOTE — Progress Notes (Signed)
Referring Physician(s): Tamara [name]  Supervising Physician: Tamara [name]  Patient Status:  Tamara [name] - In-pt  Chief Complaint: Diverticular abscess   Subjective:Patient doing okay, has ambulated, does have some intermittent right posterior thigh cramping with walking; denies nausea /vomiting    Allergies: Benadryl [diphenhydramine]; Darvon [propoxyphene]; Dilaudid [hydromorphone hcl]; Gabapentin; Ivp dye [iodinated diagnostic agents]; Levaquin [levofloxacin]; Morphine and related; Penicillins; Propoxyphene hcl; Rocephin [ceftriaxone]; Seroquel [quetiapine fumerate]; Statins; Sulfonamide derivatives; Adhesive [tape]; Dicyclomine; Other; Oxycodone; and Oxycontin [oxycodone hcl]  Medications: Prior to Admission medications   Medication Sig Start Date End Date Taking? Authorizing Provider  ALPRAZolam (XANAX XR) 1 MG 24 hr tablet Take 2 mg by mouth at bedtime.    Yes [provider]  ALPRAZolam Duanne Moron) 1 MG tablet Take 0.5-1 mg by mouth 2 (two) times daily as needed for anxiety.   Yes [provider]  aspirin 325 MG tablet Take 1 tablet (325 mg total) by mouth daily. 10/22/13  Yes Tamara Bell, MD  Cholecalciferol (VITAMIN D3) 50000 UNITS CAPS Take 50,000 Units by mouth once a week. Patient taking differently: Take 50,000 Units by mouth every Saturday.  09/24/14  Yes Tamara Haber, MD  dexlansoprazole (DEXILANT) 60 MG capsule Take 1 capsule (60 mg total) by mouth daily. 09/02/14  Yes Tamara Haber, MD  doxycycline (VIBRAMYCIN) 100 MG capsule Take 100 mg by mouth daily.    Yes [provider]  fenofibrate (TRICOR) 145 MG tablet Take 1 tablet (145 mg total) by mouth daily. 05/22/16  Yes Tamara Honour, MD  losartan (COZAAR) 100 MG tablet Take 1 tablet (100 mg total) by mouth daily. 09/07/15  Yes Tamara Haber, MD  LYSINE PO Take 1 tablet by mouth as needed. skin   Yes [provider]  Oxcarbazepine (TRILEPTAL) 300 MG tablet Take 300 mg by mouth  daily.   Yes [provider]  temazepam (RESTORIL) 15 MG capsule Take 15 mg by mouth at bedtime.  01/16/17  Yes [provider]  temazepam (RESTORIL) 30 MG capsule Take 30 mg by mouth at bedtime.   Yes [provider]  Vilazodone HCl (VIIBRYD) 10 MG TABS Take 10 mg by mouth daily.   Yes [provider]  Vilazodone HCl (VIIBRYD) 20 MG TABS Take 20 mg by mouth daily.   Yes [provider]  albuterol (PROVENTIL HFA;VENTOLIN HFA) 108 (90 Base) MCG/ACT inhaler Inhale 2 puffs into the lungs every 6 (six) hours as needed for wheezing or shortness of breath. 07/05/15   Tamara Koyanagi, MD  ciprofloxacin (CIPRO) 500 MG tablet Take 1 tablet (500 mg total) by mouth 2 (two) times daily. Patient not taking: Reported on 07/12/2018 05/05/18   Tamara Shipper, MD  metroNIDAZOLE (FLAGYL) 500 MG tablet Take 1 tablet (500 mg total) by mouth 2 (two) times daily. Patient not taking: Reported on 07/12/2018 05/05/18   Tamara Shipper, MD  nitroGLYCERIN (NITROSTAT) 0.4 MG SL tablet Place 1 tablet (0.4 mg total) under the tongue every 5 (five) minutes as needed for chest pain. 02/22/17   Tamara [name], Mihai, MD  ranitidine (ZANTAC) 150 MG tablet Take 1 tablet (150 mg total) by mouth at bedtime. Patient not taking: Reported on 07/12/2018 06/18/18   Tamara Shipper, MD     Vital Signs: BP (!) 136/94 (BP Location: Right Arm)   Pulse 86   Temp 98.4 F (36.9 C)   Resp 20   Ht 5\' 2"  (1.575 m)   Wt 192 lb 8 oz (87.3 kg)  SpO2 100%   BMI 35.21 kg/m   Physical Exam right transgluteal drain intact, insertion site okay, site mildly tender, output 15 cc of serosanguineous fluid  Imaging: Ct Abdomen Pelvis Wo Contrast  Result Date: 07/12/2018 CLINICAL DATA:  Lower abdominal pain. EXAM: CT ABDOMEN AND PELVIS WITHOUT CONTRAST TECHNIQUE: Multidetector CT imaging of the abdomen and pelvis was performed following the standard protocol without IV contrast. COMPARISON:  CT scan of May 15, 2018. FINDINGS: Lower chest: Visualized lung bases are unremarkable. Moderate size sliding-type hiatal hernia is noted. Hepatobiliary: Status post cholecystectomy. Probable fatty infiltration of the liver. No biliary dilatation is noted. Pancreas: Unremarkable. No pancreatic ductal dilatation or surrounding inflammatory changes. Spleen: Normal in size without focal abnormality. Adrenals/Urinary Tract: Adrenal glands are unremarkable. Kidneys are normal, without renal calculi, focal lesion, or hydronephrosis. Bladder is unremarkable. Stomach/Bowel: The stomach and appendix are unremarkable. There is no evidence of bowel obstruction. Worsening distal sigmoid diverticulitis is noted with adjacent 6.5 x 3.9 cm air collection with small fluid level, consistent with contained perforation and developing abscess. The perforation has a very wide neck measuring 1.4 cm. Vascular/Lymphatic: Aortic atherosclerosis. No enlarged abdominal or pelvic lymph nodes. Reproductive: Status post hysterectomy. No adnexal masses. Other: No abdominal wall hernia or abnormality. No abdominopelvic ascites. Musculoskeletal: No acute or significant osseous findings. IMPRESSION: Distal sigmoid diverticulitis is noted which is significantly progressed compared to prior exam, with adjacent 6.5 x 3.9 cm air-fluid collection consistent with contained perforation and developing abscess. The perforation has a wide opening in the bowel wall, measuring 1.4 cm at least. Hepatic steatosis. Moderate size sliding-type hiatal hernia. Aortic Atherosclerosis (ICD10-I70.0). Electronically Signed   By: Marijo Conception, M.D.   On: 07/12/2018 08:24   Ct Image Guided Drainage By Percutaneous Catheter  Result Date: 07/12/2018 CLINICAL DATA:  Diverticular abscess.  Drainage requested. EXAM: CT GUIDED DRAINAGE OF RIGHT PELVIC ABSCESS ANESTHESIA/SEDATION: Intravenous Fentanyl and Versed were administered as conscious sedation during continuous monitoring of the  patient's level of consciousness and physiological / cardiorespiratory status by the radiology RN, with a total moderate sedation time of 30 minutes. PROCEDURE: The procedure, risks, benefits, and alternatives were explained to the patient. Questions regarding the procedure were encouraged and answered. The patient understands and consents to the procedure. Patient placed prone. Select axial scans through the pelvis were obtained. The gas and fluid collection was localized and an appropriate skin entry site was determined and marked. The operative field was prepped with chlorhexidinein a sterile fashion, and a sterile drape was applied covering the operative field. A sterile gown and sterile gloves were used for the procedure. Local anesthesia was provided with 1% Lidocaine. Under CT fluoroscopic guidance, an 18 gauge trocar needle was advanced into the collection. Gas and thin cloudy fluid could be aspirated. A guidewire advanced easily within the collection, its position confirmed on CT. Tract dilated to facilitate placement of a 12 French pigtail drain catheter, formed centrally within the collection. CT confirms appropriate catheter positioning. Catheter secured externally with 0 Prolene suture and StatLock and placed to gravity drain bag. A sample of the aspirate was sent for Gram stain and culture. The patient tolerated the procedure well. COMPLICATIONS: None immediate FINDINGS: Gas and fluid collection in the deep right pelvis was again localized. Transgluteal 12 French pigtail drain catheter placed as above. Sample of the aspirate sent for Gram stain and culture. IMPRESSION: 1. Technically successful CT-guided right pelvic abscess drain catheter placement Electronically Signed   By: Lucrezia Europe  M.D.   On: 07/12/2018 14:58    Labs:  CBC: Recent Labs    07/12/18 0640 07/13/18 0543 07/14/18 0613 07/15/18 0405  WBC 9.4 6.5 5.4 5.5  HGB 12.9 11.4* 10.8* 11.1*  HCT 39.1 35.8* 33.2* 34.4*  PLT 251 192  179 211    COAGS: Recent Labs    03/14/18 2147  INR 1.02  APTT 30    BMP: Recent Labs    07/12/18 0640 07/13/18 0543 07/14/18 0613 07/15/18 0405  NA 136 138 139 139  K 3.7 3.7 3.6 3.5  CL 103 105 107 106  CO2 24 26 22 24   GLUCOSE 101* 100* 84 96  BUN 28* 16 12 8   CALCIUM 10.8* 9.0 8.8* 9.2  CREATININE 1.24* 1.00 0.94 0.97  GFRNONAA 49* >60 >60 >60  GFRAA 56* >60 >60 >60    LIVER FUNCTION TESTS: Recent Labs    03/14/18 2147 07/12/18 0640  BILITOT 1.1 1.0  AST 28 20  ALT 22 15  ALKPHOS 78 64  PROT 7.5 6.8  ALBUMIN 4.2 4.1    Assessment and Plan: Pt with hx diverticular abscess , s/p drainage 07/12/18; afebrile; WBC nl; creat nl; hgb 11.1 ; drain fluid cx- e coli; cont with drain flushes/output monitoring; check f/u imaging within 1 week of placement or if output significantly declines; will likely need drain injection before removal   Electronically Signed: [name], PA-C 07/15/2018, 5:21 PM   I spent a total of 15 minutes at the the patient's bedside AND on the patient's [name] floor or unit, greater than 50% of which was counseling/coordinating care for pelvic abscess drain    Patient ID: Tamara [name], female   DOB: 05-18-1961, 58 y.o.   MRN: [id number]

## 2018-07-15 NOTE — Progress Notes (Signed)
PROGRESS NOTE    Tamara [name]  PVV:[id number] DOB: 1960-09-19 DOA: 07/12/2018 PCP: Jilda Panda, MD    Brief Narrative:  Patient is a 58 year old female prior history of diverticulitis with perforation complicated with sepsis during recent hospitalization December 2019, anxiety/PTSD, morbid obesity, GERD presenting to the ED with abdominal pain.  Repeat CT abdomen and pelvis concerning for diverticulitis with contained perforation and developing abscess.  Patient admitted placed empirically on IV ciprofloxacin and IV Flagyl.  General surgery consulted and are following.  IR consulted and patient status post CT-guided right pelvic abscess drain placement 07/12/2018.   Assessment & Plan:   Principal Problem:   Diverticulitis of colon Active Problems:   Hyperlipidemia   OBESITY   Anxiety   Depression   Essential hypertension   CKD (chronic kidney disease), stage III (HCC)   PTSD (post-traumatic stress disorder)   Abscess  1 acute diverticulitis with contained perforation with developing abscess Clinically improving slowly.  Findings noted on CT abdomen and pelvis 07/12/2018.  General surgery and IR consulted.  Patient status post percutaneous drain placement per IR 07/12/2018 with cultures positive for E. coli which is resistant to ampicillin and ciprofloxacin.  Discontinued IV ciprofloxacin and IV Flagyl and started patient on IV Ancef.  Patient tolerating clear liquids.  Continue IV Compazine as needed for nausea.  Patient being followed by general surgery and diet has been advanced to full liquid diet.  Drain in place and IR following.  General surgery following and appreciate input and recommendations.  2.  Depression/anxiety/PTSD Stable.  Continue home regimen of Trileptal, and Viibryd.  Xanax nightly.  Outpatient follow-up.  3.  Gastroesophageal reflux disease with moderate sized hiatal hernia Patient complaining that Protonix not effective and has been refusing ordered Protonix.   Will DC Protonix.  GI cocktail as needed.   4.  Hypertension Patient noted to have borderline blood pressure with systolics in the 75Q on 4/92/0100.  Blood pressure improving with hydration.  Continue to hold Cozaar.  Follow.  5.  Hyperlipidemia Resume TriCor on discharge.  6.  Obesity   DVT prophylaxis: Lovenox Code Status: Full Family Communication: Updated patient.  No family at bedside. Disposition Plan: Home when clinically improved and per general surgery.   Consultants:   General surgery: Dr. Lucia Gaskins 07/12/2018  Interventional radiology  Procedures:   CT image guided right pelvic abscess drain placement per Dr. Vernard Gambles 07/12/2018  CT abdomen and pelvis 07/12/2018    Antimicrobials:   IV ciprofloxacin 07/12/2018>>>>>> 07/14/2018  IV Flagyl 07/12/2018>>>>> 07/14/2018  IV Ancef 07/14/2018   Subjective: Patient sitting up in bed.  Denies any nausea or emesis.  Denies any chest pain.  States abdominal pain is improving.  Drain in place.  Tolerating clear liquids.  Objective: Vitals:   07/14/18 0546 07/14/18 1355 07/14/18 2040 07/15/18 0614  BP: 117/76 132/90 (!) 113/91 (!) 123/92  Pulse: 93 92 72 86  Resp: 18 18 18 18   Temp: 98.2 F (36.8 C) 98.7 F (37.1 C) 98.4 F (36.9 C) 100 F (37.8 C)  TempSrc: Oral  Oral Oral  SpO2: 96% 99% 100% 98%  Weight:      Height:        Intake/Output Summary (Last 24 hours) at 07/15/2018 1007 Last data filed at 07/15/2018 0636 Gross per 24 hour  Intake 2248.07 ml  Output 3365 ml  Net -1116.93 ml   Filed Weights   07/12/18 0623 07/12/18 1513  Weight: 85.7 kg 87.3 kg    Examination:  General  exam: NAD Respiratory system: Lungs clear to auscultation bilaterally.  No wheezes, no crackles, no rhonchi.  Normal respiratory effort.  Cardiovascular system: RRR no murmurs rubs or gallops.  No JVD.  No lower extremity edema.  Gastrointestinal system: Abdomen is nondistended, soft, positive bowel sounds.  Decreasing tenderness to  palpation in the left lower quadrant.  Positive bowel sounds.  Drain in place.   Central nervous system: Alert and oriented. No focal neurological deficits. Extremities: Symmetric 5 x 5 power. Skin: No rashes, lesions or ulcers Psychiatry: Judgement and insight appear normal. Mood & affect appropriate.     Data Reviewed: I have personally reviewed following labs and imaging studies  CBC: Recent Labs  Lab 07/12/18 0640 07/13/18 0543 07/14/18 0613 07/15/18 0405  WBC 9.4 6.5 5.4 5.5  NEUTROABS  --   --  3.5  --   HGB 12.9 11.4* 10.8* 11.1*  HCT 39.1 35.8* 33.2* 34.4*  MCV 86.5 87.7 88.8 87.8  PLT 251 192 179 426   Basic Metabolic Panel: Recent Labs  Lab 07/12/18 0640 07/13/18 0543 07/14/18 0613 07/15/18 0405  NA 136 138 139 139  K 3.7 3.7 3.6 3.5  CL 103 105 107 106  CO2 24 26 22 24   GLUCOSE 101* 100* 84 96  BUN 28* 16 12 8   CREATININE 1.24* 1.00 0.94 0.97  CALCIUM 10.8* 9.0 8.8* 9.2   GFR: Estimated Creatinine Clearance: 66.5 mL/min (by C-G formula based on SCr of 0.97 mg/dL). Liver Function Tests: Recent Labs  Lab 07/12/18 0640  AST 20  ALT 15  ALKPHOS 64  BILITOT 1.0  PROT 6.8  ALBUMIN 4.1   Recent Labs  Lab 07/12/18 0640  LIPASE 56*   No results for input(s): AMMONIA in the last 168 hours. Coagulation Profile: No results for input(s): INR, PROTIME in the last 168 hours. Cardiac Enzymes: No results for input(s): CKTOTAL, CKMB, CKMBINDEX, TROPONINI in the last 168 hours. BNP (last 3 results) No results for input(s): PROBNP in the last 8760 hours. HbA1C: No results for input(s): HGBA1C in the last 72 hours. CBG: No results for input(s): GLUCAP in the last 168 hours. Lipid Profile: No results for input(s): CHOL, HDL, LDLCALC, TRIG, CHOLHDL, LDLDIRECT in the last 72 hours. Thyroid Function Tests: No results for input(s): TSH, T4TOTAL, FREET4, T3FREE, THYROIDAB in the last 72 hours. Anemia Panel: No results for input(s): VITAMINB12, FOLATE,  FERRITIN, TIBC, IRON, RETICCTPCT in the last 72 hours. Sepsis Labs: Recent Labs  Lab 07/12/18 1519 07/12/18 1801  LATICACIDVEN 1.6 1.3    Recent Results (from the past 240 hour(s))  Aerobic/Anaerobic Culture (surgical/deep wound)     Status: None (Preliminary result)   Collection Time: 07/12/18  2:39 PM  Result Value Ref Range Status   Specimen Description   Final    JP DRAINAGE Performed at [facility] [address], [place], [place] [address]    Special Requests   Final    NONE Performed at [facility] [place] [address], [place], [place] [address]    Gram Stain   Final    ABUNDANT WBC PRESENT, PREDOMINANTLY PMN RARE GRAM POSITIVE COCCI RARE GRAM NEGATIVE RODS Performed at [facility], [place] [address] [place], [place] [address]    Culture   Final    MODERATE ESCHERICHIA COLI MIXED ANAEROBIC FLORA PRESENT.  CALL LAB IF FURTHER IID REQUIRED.    Report Status PENDING  Incomplete   Organism ID, Bacteria ESCHERICHIA COLI  Final  Susceptibility   Escherichia coli - MIC*    AMPICILLIN >=32 RESISTANT Resistant     CEFAZOLIN <=4 SENSITIVE Sensitive     CEFEPIME <=1 SENSITIVE Sensitive     CEFTAZIDIME <=1 SENSITIVE Sensitive     CEFTRIAXONE <=1 SENSITIVE Sensitive     CIPROFLOXACIN >=4 RESISTANT Resistant     GENTAMICIN <=1 SENSITIVE Sensitive     IMIPENEM <=0.25 SENSITIVE Sensitive     TRIMETH/SULFA >=320 RESISTANT Resistant     AMPICILLIN/SULBACTAM >=32 RESISTANT Resistant     PIP/TAZO <=4 SENSITIVE Sensitive     Extended ESBL NEGATIVE Sensitive     * MODERATE ESCHERICHIA COLI         Radiology Studies: No results found.      Scheduled Meds: . ALPRAZolam  2 mg Oral QHS  . enoxaparin (LOVENOX) injection  40 mg Subcutaneous Q24H  . Oxcarbazepine  300 mg Oral Daily  . pantoprazole  40 mg Oral BID AC  . sodium chloride flush  10-40 mL Intracatheter Q12H  . sodium chloride flush  5 mL Intracatheter Q8H  .  temazepam  15 mg Oral QHS  . temazepam  30 mg Oral QHS  . Vilazodone HCl  10 mg Oral Daily  . Vilazodone HCl  20 mg Oral Daily   Continuous Infusions: . sodium chloride 100 mL/hr at 07/15/18 0600  .  ceFAZolin (ANCEF) IV 2 g (07/15/18 0636)  . methocarbamol (ROBAXIN) IV       LOS: 3 days    Time spent: 35 minutes    Irine Seal, MD [facility]  If 7PM-7AM, please contact night-coverage [email or web address] 07/15/2018, 10:07 AM

## 2018-07-16 LAB — CBC
HCT: 34.2 % — ABNORMAL LOW (ref 36.0–46.0)
Hemoglobin: 11.4 g/dL — ABNORMAL LOW (ref 12.0–15.0)
MCH: 29.2 pg (ref 26.0–34.0)
MCHC: 33.3 g/dL (ref 30.0–36.0)
MCV: 87.5 fL (ref 80.0–100.0)
Platelets: 251 10*3/uL (ref 150–400)
RBC: 3.91 MIL/uL (ref 3.87–5.11)
RDW: 12.5 % (ref 11.5–15.5)
WBC: 5.8 10*3/uL (ref 4.0–10.5)
nRBC: 0 % (ref 0.0–0.2)

## 2018-07-16 LAB — BASIC METABOLIC PANEL
Anion gap: 10 (ref 5–15)
BUN: 7 mg/dL (ref 6–20)
CO2: 23 mmol/L (ref 22–32)
Calcium: 9.4 mg/dL (ref 8.9–10.3)
Chloride: 107 mmol/L (ref 98–111)
Creatinine, Ser: 0.92 mg/dL (ref 0.44–1.00)
GFR calc Af Amer: >60 mL/min (ref >60)
GFR calc non Af Amer: >60 mL/min (ref >60)
GLUCOSE: 101 mg/dL — AB (ref 70–99)
Potassium: 3.7 mmol/L (ref 3.5–5.1)
Sodium: 140 mmol/L (ref 135–145)

## 2018-07-16 MED ORDER — VILAZODONE HCL 10 MG PO TABS
30.0000 mg | ORAL_TABLET | Freq: Every day | ORAL | Status: DC
  Administered 2018-07-16 – 2018-07-17 (×2): 30 mg via ORAL
  Filled 2018-07-16 (×2): qty 3

## 2018-07-16 MED ORDER — CYCLOBENZAPRINE HCL 5 MG PO TABS
5.0000 mg | ORAL_TABLET | Freq: Three times a day (TID) | ORAL | Status: DC | PRN
  Administered 2018-07-16: 5 mg via ORAL
  Filled 2018-07-16: qty 1

## 2018-07-16 NOTE — Progress Notes (Addendum)
CC:  Abdominal pain  Subjective: Had a bad night with discomfort which she attributes to Robaxin.  Still having some pain but it is a transgluteal drain.  She is better this AM, no real pain on abdominal exam and up walking a good deal.    Objective: Vital signs in last 24 hours: Temp:  [98.4 F (36.9 C)-98.5 F (36.9 C)] 98.5 F (36.9 C) (01/15 0326) Pulse Rate:  [85-90] 85 (01/15 0326) Resp:  [17-20] 20 (01/15 0326) BP: (118-136)/(94-97) 118/97 (01/15 0326) SpO2:  [99 %-100 %] 99 % (01/15 0326) Last BM Date: 07/15/18  Intake/Output from previous day: 01/14 0701 - 01/15 0700 In: 865.6 [I.V.:745.6; IV Piggyback:100] Out: -  Intake/Output this shift: No intake/output data recorded.  General appearance: alert, cooperative and no distress Resp: clear to auscultation bilaterally GI: soft nontender, pain last PM but better today.  Drain is serous and a little cloudy.    Lab Results:  Recent Labs    07/15/18 0405 07/16/18 0319  WBC 5.5 5.8  HGB 11.1* 11.4*  HCT 34.4* 34.2*  PLT 211 251    BMET Recent Labs    07/15/18 0405 07/16/18 0319  NA 139 140  K 3.5 3.7  CL 106 107  CO2 24 23  GLUCOSE 96 101*  BUN 8 7  CREATININE 0.97 0.92  CALCIUM 9.2 9.4   PT/INR No results for input(s): LABPROT, INR in the last 72 hours.  Recent Labs  Lab 07/12/18 0640  AST 20  ALT 15  ALKPHOS 64  BILITOT 1.0  PROT 6.8  ALBUMIN 4.1     Lipase     Component Value Date/Time   LIPASE 56 (H) 07/12/2018 0640     Medications: . ALPRAZolam  2 mg Oral QHS  . enoxaparin (LOVENOX) injection  40 mg Subcutaneous Q24H  . Oxcarbazepine  300 mg Oral Daily  . sodium chloride flush  10-40 mL Intracatheter Q12H  . sodium chloride flush  5 mL Intracatheter Q8H  . temazepam  15 mg Oral QHS  . temazepam  30 mg Oral QHS  . Vilazodone HCl  30 mg Oral Daily   . sodium chloride 100 mL/hr at 07/15/18 0706  .  ceFAZolin (ANCEF) IV 2 g (07/16/18 9528)  . methocarbamol (ROBAXIN) IV      Anti-infectives (From admission, onward)   Start     Dose/Rate Route Frequency Ordered Stop   07/14/18 1500  ceFAZolin (ANCEF) IVPB 2g/100 mL premix     2 g 200 mL/hr over 30 Minutes Intravenous Every 8 hours 07/14/18 1426     07/12/18 2200  ciprofloxacin (CIPRO) IVPB 400 mg  Status:  Discontinued     400 mg 200 mL/hr over 60 Minutes Intravenous Every 12 hours 07/12/18 1511 07/14/18 1426   07/12/18 2100  metroNIDAZOLE (FLAGYL) IVPB 500 mg  Status:  Discontinued     500 mg 100 mL/hr over 60 Minutes Intravenous Every 8 hours 07/12/18 1511 07/14/18 1426   07/12/18 1030  ciprofloxacin (CIPRO) IVPB 400 mg     400 mg 200 mL/hr over 60 Minutes Intravenous  Once 07/12/18 1017 07/12/18 1307   07/12/18 1030  metroNIDAZOLE (FLAGYL) IVPB 500 mg     500 mg 100 mL/hr over 60 Minutes Intravenous  Once 07/12/18 1017 07/12/18 1408      Assessment/Plan Depression/anxiety PTSD GERD Moderate sized hiatal hernia HTN Obesity - BMI 34.5  Perforated diverticulitiswith abscess S/p IR drain 1/11 - culture grew E coli, antibiotics changed to  ancef 1/13 - WBC (5,800 - 07/16/2018), pain improved, tolerating clears and started having bowel function today - Advance to full liquids. Continue drain and IV antibiotics. Continue ambulating.   ID -cipro/flagyl 1/11>>1/13, ancef 1/13>> FEN -IVF, FLD VTE -SCDs, lovenox Foley -none  Plan:  Soft diet, continue antibiotics for 14 days total.    If she does well convert to PO tomorrow and plan DC home tomorrow.      LOS: 4 days    JENNINGS,WILLARD 07/16/2018 [id number]  Agree with above. Seems to be progressing.  The drainage in her perc drain bag does not look bad.  She is optimistic about going home tomorrow.  Alphonsa Overall, MD, [facility] Pager: [phone] Office phone:  [phone]

## 2018-07-16 NOTE — Progress Notes (Signed)
Referring Physician(s): Creswell  Supervising Physician: Aletta Edouard  Patient Status:  [facility] - In-pt  Chief Complaint: Diverticular abscess   Subjective:Patient doing okay, has ambulated, does have some intermittent right posterior thigh cramping with walking; denies nausea /vomiting    Allergies: Benadryl [diphenhydramine]; Darvon [propoxyphene]; Dilaudid [hydromorphone hcl]; Gabapentin; Ivp dye [iodinated diagnostic agents]; Levaquin [levofloxacin]; Morphine and related; Penicillins; Propoxyphene hcl; Rocephin [ceftriaxone]; Seroquel [quetiapine fumerate]; Statins; Sulfonamide derivatives; Adhesive [tape]; Dicyclomine; Other; Oxycodone; Oxycontin [oxycodone hcl]; and Robaxin [methocarbamol]  Medications:  Current Facility-Administered Medications:  .  0.9 %  sodium chloride infusion, , Intravenous, Continuous, Earnstine Regal, PA-C, Last Rate: 50 mL/hr at 07/16/18 1112 .  acetaminophen (TYLENOL) tablet 1,000 mg, 1,000 mg, Oral, Q6H PRN, Michael Boston, MD, 1,000 mg at 07/15/18 2127 .  albuterol (PROVENTIL) (2.5 MG/3ML) 0.083% nebulizer solution 3 mL, 3 mL, Inhalation, Q6H PRN, Spongberg, Audie Pinto, MD .  ALPRAZolam (XANAX XR) 24 hr tablet 2 mg, 2 mg, Oral, QHS, Spongberg, Audie Pinto, MD, 2 mg at 07/15/18 2127 .  ALPRAZolam Duanne Moron) tablet 0.5-1 mg, 0.5-1 mg, Oral, BID PRN, Marcell Anger, MD, 0.5 mg at 07/14/18 1442 .  alum & mag hydroxide-simeth (MAALOX/MYLANTA) 200-200-20 MG/5ML suspension 30 mL, 30 mL, Oral, Q6H PRN, 30 mL at 07/15/18 2127 **AND** lidocaine (XYLOCAINE) 2 % viscous mouth solution 15 mL, 15 mL, Oral, Q6H PRN, Eugenie Filler, MD .  ceFAZolin (ANCEF) IVPB 2g/100 mL premix, 2 g, Intravenous, Q8H, Eugenie Filler, MD, Last Rate: 200 mL/hr at 07/16/18 1334, 2 g at 07/16/18 1334 .  cyclobenzaprine (FLEXERIL) tablet 5 mg, 5 mg, Oral, TID PRN, Elodia Florence., MD .  enoxaparin (LOVENOX) injection 40 mg, 40 mg, Subcutaneous, Q24H,  Meuth, Brooke A, PA-C, 40 mg at 07/16/18 0950 .  HYDROmorphone (DILAUDID) injection 0.5 mg, 0.5 mg, Intravenous, Q4H PRN, Eugenie Filler, MD, 0.5 mg at 07/14/18 1557 .  hydrOXYzine (ATARAX/VISTARIL) tablet 25 mg, 25 mg, Oral, TID PRN, Minda Ditto, RPH .  lip balm (CARMEX) ointment, , Topical, PRN, Eugenie Filler, MD .  loperamide (IMODIUM) capsule 2 mg, 2 mg, Oral, PRN, Eugenie Filler, MD, 2 mg at 07/15/18 1219 .  methocarbamol (ROBAXIN) tablet 1,000 mg, 1,000 mg, Oral, Q6H PRN, Michael Boston, MD, 1,000 mg at 07/15/18 1536 .  Oxcarbazepine (TRILEPTAL) tablet 300 mg, 300 mg, Oral, Daily, Spongberg, Audie Pinto, MD, 300 mg at 07/16/18 0950 .  prochlorperazine (COMPAZINE) injection 10 mg, 10 mg, Intravenous, Q6H PRN, Eugenie Filler, MD, 10 mg at 07/15/18 2253 .  sodium chloride flush (NS) 0.9 % injection 10-40 mL, 10-40 mL, Intracatheter, Q12H, Eugenie Filler, MD, 10 mL at 07/16/18 0951 .  sodium chloride flush (NS) 0.9 % injection 10-40 mL, 10-40 mL, Intracatheter, PRN, Eugenie Filler, MD, 20 mL at 07/16/18 0318 .  sodium chloride flush (NS) 0.9 % injection 5 mL, 5 mL, Intracatheter, Q8H, Arne Cleveland, MD, 5 mL at 07/16/18 1334 .  temazepam (RESTORIL) capsule 15 mg, 15 mg, Oral, QHS, Spongberg, Audie Pinto, MD, 15 mg at 07/15/18 2126 .  temazepam (RESTORIL) capsule 30 mg, 30 mg, Oral, QHS, Spongberg, Audie Pinto, MD, 30 mg at 07/15/18 2126 .  Vilazodone HCl (VIIBRYD) TABS 30 mg, 30 mg, Oral, Daily, Elodia Florence., MD, 30 mg at 07/16/18 0950    Vital Signs: BP (!) 141/93 (BP Location: Right Arm)   Pulse 74   Temp 98.7 F (37.1 C)   Resp 18   Ht 5'  2" (1.575 m)   Wt 87.3 kg   SpO2 99%   BMI 35.21 kg/m   Physical Exam Constitutional:      Appearance: She is well-developed.  Neurological:     Mental Status: She is alert.    Right transgluteal drain intact, insertion site okay, site mildly tender  Output serosanguineous  Imaging: No results  found.  Labs:  CBC: Recent Labs    07/13/18 0543 07/14/18 0613 07/15/18 0405 07/16/18 0319  WBC 6.5 5.4 5.5 5.8  HGB 11.4* 10.8* 11.1* 11.4*  HCT 35.8* 33.2* 34.4* 34.2*  PLT 192 179 211 251    COAGS: Recent Labs    03/14/18 2147  INR 1.02  APTT 30    BMP: Recent Labs    07/13/18 0543 07/14/18 0613 07/15/18 0405 07/16/18 0319  NA 138 139 139 140  K 3.7 3.6 3.5 3.7  CL 105 107 106 107  CO2 26 22 24 23   GLUCOSE 100* 84 96 101*  BUN 16 12 8 7   CALCIUM 9.0 8.8* 9.2 9.4  CREATININE 1.00 0.94 0.97 0.92  GFRNONAA >60 >60 >60 >60  GFRAA >60 >60 >60 >60    LIVER FUNCTION TESTS: Recent Labs    03/14/18 2147 07/12/18 0640  BILITOT 1.1 1.0  AST 28 20  ALT 22 15  ALKPHOS 78 64  PROT 7.5 6.8  ALBUMIN 4.2 4.1    Assessment and Plan: Pt with hx diverticular abscess , s/p drainage 07/12/18;  Feeling much better today Hopeful for discharge tomorrow. Will coordinate output follow up/imaging.   Electronically Signed: Ascencion Dike, PA-C 07/16/2018, 3:10 PM   I spent a total of 15 minutes at the the patient's bedside AND on the patient's hospital floor or unit, greater than 50% of which was counseling/coordinating care for pelvic abscess drain    Patient ID: Tamara [name], female   DOB: 08-15-1960, 58 y.o.   MRN: [id number]

## 2018-07-16 NOTE — Progress Notes (Signed)
**Note De-Identified vi Obfusction** PROGRESS NOTE    Tamara [name]  HCW:[id number] DOB: [birth year] DOA: 07/12/2018 PCP: Jild Pnd, MD   Brief Nrrtive:  Ptient is  41 yer old femle prior history of diverticulitis with perfortion complicted with sepsis during recent hospitliztion December 2019, nxiety/PTSD, morbid obesity, GERD presenting to the ED with bdominl pin.  Repet CT bdomen nd pelvis concerning for diverticulitis with contined perfortion nd developing bscess.  Ptient dmitted plced empiriclly on IV ciprofloxcin nd IV Flgyl.  Generl surgery consulted nd re following.  IR consulted nd ptient sttus post CT-guided right pelvic bscess drin plcement 07/12/2018.   Assessment & Pln:   Principl Problem:   Diverticulitis of colon Active Problems:   Hyperlipidemi   OBESITY   Anxiety   Depression   [place] hypertension   CKD (chronic kidney disese), stge III (HCC)   PTSD (post-trumtic stress disorder)   Abscess   1 cute diverticulitis with contined perfortion with developing bscess CT 1/11 with diverticulitis with 6.5 x 3.9 cm ir fluid collection c/w contined perfortion nd developing bscess  Surgery/IR following S/p per drin by IR on 1/11 Cx growing e. Coli sensitive to ncef (resistnt to mp/cipro) Plnning for possible d/c tomorrow, IR will coordinte outptient f/u nd imging Pln for 14 dys bx, pln to convert to PO bx tomorrow nd d/c home if doing well  2.  Depression/nxiety/PTSD Stble.  Continue home regimen of Trileptl, nd Viibryd.  Xnx nightly.  Outptient follow-up.  3.  Gstroesophgel reflux disese with moderte sized hitl herni Ptient complining tht Protonix not effective nd hs been refusing ordered Protonix.  Will DC Protonix.  GI cocktil s needed.   4.  Hypertension Low bp's improved, follow  5.  Hyperlipidemi Resume TriCor on dischrge.  6.  Obesity  DVT prophylxis:lovenox Code Sttus: full  Fmily  Communiction: none t bedside Disposition Pln: pending    Consultnts:   Surgery  IR  Procedures:  CT guided drin plcement 1/11  Antimicrobils:  Anti-infectives (From dmission, onwrd)   Strt     Dose/Rte Route Frequency Ordered Stop   07/14/18 1500  ceFAZolin (ANCEF) IVPB 2g/100 mL premix     2 g 200 mL/hr over 30 Minutes Intrvenous Every 8 hours 07/14/18 1426     07/12/18 2200  ciprofloxcin (CIPRO) IVPB 400 mg  Sttus:  Discontinued     400 mg 200 mL/hr over 60 Minutes Intrvenous Every 12 hours 07/12/18 1511 07/14/18 1426   07/12/18 2100  metroNIDAZOLE (FLAGYL) IVPB 500 mg  Sttus:  Discontinued     500 mg 100 mL/hr over 60 Minutes Intrvenous Every 8 hours 07/12/18 1511 07/14/18 1426   07/12/18 1030  ciprofloxcin (CIPRO) IVPB 400 mg     400 mg 200 mL/hr over 60 Minutes Intrvenous  Once 07/12/18 1017 07/12/18 1307   07/12/18 1030  metroNIDAZOLE (FLAGYL) IVPB 500 mg     500 mg 100 mL/hr over 60 Minutes Intrvenous  Once 07/12/18 1017 07/12/18 1408         Subjective: "I feel pretty good" Hd GI upset lst night ttributes to robxin  Objective: Vitls:   07/15/18 0614 07/15/18 1335 07/15/18 2043 07/16/18 0326  BP: (!) 123/92 (!) 136/94 (!) 129/97 (!) 118/97  Pulse: 86 86 90 85  Resp: 18 20 17 20   Temp: 100 F (37.8 C) 98.4 F (36.9 C) 98.5 F (36.9 C) 98.5 F (36.9 C)  TempSrc: Orl     SpO2: 98% 100% 100% 99%  Weight:      Height:  Intake/Output Summary (Last 24 hours) at 07/16/2018 1237 Last data filed at 07/15/2018 1500 Gross per 24 hour  Intake 865.62 ml  Output -  Net 865.62 ml   Filed Weights   07/12/18 0623 07/12/18 1513  Weight: 85.7 kg 87.3 kg    Examination:  General exam: Appears calm and comfortable  Respiratory system: Clear to auscultation. Respiratory effort normal. Cardiovascular system: S1 & S2 heard, RRR. Gastrointestinal system: Abdomen is nondistended, soft and nontender.  Drain at [employer] back. Central  nervous system: Alert and oriented. No focal neurological deficits. Extremities: no LEE Skin: No rashes, lesions or ulcers Psychiatry: Judgement and insight appear normal. Mood & affect appropriate.     Data Reviewed: I have personally reviewed following labs and imaging studies  CBC: Recent Labs  Lab 07/12/18 0640 07/13/18 0543 07/14/18 0613 07/15/18 0405 07/16/18 0319  WBC 9.4 6.5 5.4 5.5 5.8  NEUTROABS  --   --  3.5  --   --   HGB 12.9 11.4* 10.8* 11.1* 11.4*  HCT 39.1 35.8* 33.2* 34.4* 34.2*  MCV 86.5 87.7 88.8 87.8 87.5  PLT 251 192 179 211 588   Basic Metabolic Panel: Recent Labs  Lab 07/12/18 0640 07/13/18 0543 07/14/18 0613 07/15/18 0405 07/16/18 0319  NA 136 138 139 139 140  K 3.7 3.7 3.6 3.5 3.7  CL 103 105 107 106 107  CO2 24 26 22 24 23   GLUCOSE 101* 100* 84 96 101*  BUN 28* 16 12 8 7   CREATININE 1.24* 1.00 0.94 0.97 0.92  CALCIUM 10.8* 9.0 8.8* 9.2 9.4   GFR: Estimated Creatinine Clearance: 70.1 mL/min (by C-G formula based on SCr of 0.92 mg/dL). Liver Function Tests: Recent Labs  Lab 07/12/18 0640  AST 20  ALT 15  ALKPHOS 64  BILITOT 1.0  PROT 6.8  ALBUMIN 4.1   Recent Labs  Lab 07/12/18 0640  LIPASE 56*   No results for input(s): AMMONIA in the last 168 hours. Coagulation Profile: No results for input(s): INR, PROTIME in the last 168 hours. Cardiac Enzymes: No results for input(s): CKTOTAL, CKMB, CKMBINDEX, TROPONINI in the last 168 hours. BNP (last 3 results) No results for input(s): PROBNP in the last 8760 hours. HbA1C: No results for input(s): HGBA1C in the last 72 hours. CBG: No results for input(s): GLUCAP in the last 168 hours. Lipid Profile: No results for input(s): CHOL, HDL, LDLCALC, TRIG, CHOLHDL, LDLDIRECT in the last 72 hours. Thyroid Function Tests: No results for input(s): TSH, T4TOTAL, FREET4, T3FREE, THYROIDAB in the last 72 hours. Anemia Panel: No results for input(s): VITAMINB12, FOLATE, FERRITIN, TIBC, IRON,  RETICCTPCT in the last 72 hours. Sepsis Labs: Recent Labs  Lab 07/12/18 1519 07/12/18 1801  LATICACIDVEN 1.6 1.3    Recent Results (from the past 240 hour(s))  Aerobic/Anaerobic Culture (surgical/deep wound)     Status: None (Preliminary result)   Collection Time: 07/12/18  2:39 PM  Result Value Ref Range Status   Specimen Description   Final    JP DRAINAGE Performed at [facility] [address], [place], [place] [address]    Special Requests   Final    NONE Performed at [facility], [place] [address], [place], [place] [address]    Gram Stain   Final    ABUNDANT WBC PRESENT, PREDOMINANTLY PMN RARE GRAM POSITIVE COCCI RARE GRAM NEGATIVE RODS Performed at [facility], [place] [address], [place], [place] [address]    Culture   Final  MODERATE ESCHERICHIA COLI MIXED ANAEROBIC FLORA PRESENT.  CALL LAB IF FURTHER IID REQUIRED.    Report Status PENDING  Incomplete   Organism ID, Bacteria ESCHERICHIA COLI  Final      Susceptibility   Escherichia coli - MIC*    AMPICILLIN >=32 RESISTANT Resistant     CEFAZOLIN <=4 SENSITIVE Sensitive     CEFEPIME <=1 SENSITIVE Sensitive     CEFTAZIDIME <=1 SENSITIVE Sensitive     CEFTRIAXONE <=1 SENSITIVE Sensitive     CIPROFLOXACIN >=4 RESISTANT Resistant     GENTAMICIN <=1 SENSITIVE Sensitive     IMIPENEM <=0.25 SENSITIVE Sensitive     TRIMETH/SULFA >=320 RESISTANT Resistant     AMPICILLIN/SULBACTAM >=32 RESISTANT Resistant     PIP/TAZO <=4 SENSITIVE Sensitive     Extended ESBL NEGATIVE Sensitive     * MODERATE ESCHERICHIA COLI         Radiology Studies: No results found.      Scheduled Meds: . ALPRAZolam  2 mg Oral QHS  . enoxaparin (LOVENOX) injection  40 mg Subcutaneous Q24H  . Oxcarbazepine  300 mg Oral Daily  . sodium chloride flush  10-40 mL Intracatheter Q12H  . sodium chloride flush  5 mL Intracatheter Q8H  . temazepam  15 mg Oral QHS  . temazepam  30 mg Oral QHS  .  Vilazodone HCl  30 mg Oral Daily   Continuous Infusions: . sodium chloride 50 mL/hr at 07/16/18 1112  .  ceFAZolin (ANCEF) IV 2 g (07/16/18 7903)     LOS: 4 days    Time spent: over 67 min    Fayrene Helper, MD [facility] Pager [phone]  If 7PM-7AM, please contact night-coverage [email or web address] Password [facility] 07/16/2018, 12:37 PM

## 2018-07-17 ENCOUNTER — Other Ambulatory Visit: Payer: Self-pay | Admitting: Radiology

## 2018-07-17 ENCOUNTER — Other Ambulatory Visit: Payer: Self-pay | Admitting: Surgery

## 2018-07-17 DIAGNOSIS — K651 Peritoneal abscess: Secondary | ICD-10-CM

## 2018-07-17 LAB — COMPREHENSIVE METABOLIC PANEL
ALT: 15 U/L (ref 0–44)
AST: 18 U/L (ref 15–41)
Albumin: 3.4 g/dL — ABNORMAL LOW (ref 3.5–5.0)
Alkaline Phosphatase: 53 U/L (ref 38–126)
Anion gap: 8 (ref 5–15)
BUN: 10 mg/dL (ref 6–20)
CO2: 25 mmol/L (ref 22–32)
Calcium: 9.1 mg/dL (ref 8.9–10.3)
Chloride: 105 mmol/L (ref 98–111)
Creatinine, Ser: 1.02 mg/dL — ABNORMAL HIGH (ref 0.44–1.00)
GFR calc Af Amer: >60 mL/min (ref >60)
Glucose, Bld: 112 mg/dL — ABNORMAL HIGH (ref 70–99)
Potassium: 3.6 mmol/L (ref 3.5–5.1)
Sodium: 138 mmol/L (ref 135–145)
Total Bilirubin: 0.6 mg/dL (ref 0.3–1.2)
Total Protein: 6.4 g/dL — ABNORMAL LOW (ref 6.5–8.1)

## 2018-07-17 LAB — CBC
HCT: 33.6 % — ABNORMAL LOW (ref 36.0–46.0)
Hemoglobin: 11.1 g/dL — ABNORMAL LOW (ref 12.0–15.0)
MCH: 28.9 pg (ref 26.0–34.0)
MCHC: 33 g/dL (ref 30.0–36.0)
MCV: 87.5 fL (ref 80.0–100.0)
Platelets: 237 10*3/uL (ref 150–400)
RBC: 3.84 MIL/uL — ABNORMAL LOW (ref 3.87–5.11)
RDW: 12.6 % (ref 11.5–15.5)
WBC: 7.7 10*3/uL (ref 4.0–10.5)
nRBC: 0 % (ref 0.0–0.2)

## 2018-07-17 LAB — AEROBIC/ANAEROBIC CULTURE W GRAM STAIN (SURGICAL/DEEP WOUND)

## 2018-07-17 LAB — AEROBIC/ANAEROBIC CULTURE (SURGICAL/DEEP WOUND)

## 2018-07-17 LAB — MAGNESIUM: Magnesium: 1.8 mg/dL (ref 1.7–2.4)

## 2018-07-17 MED ORDER — CEPHALEXIN 500 MG PO CAPS: 500.0000 mg | ORAL_CAPSULE | Freq: Four times a day (QID) | ORAL | 0 refills | Status: AC

## 2018-07-17 MED ORDER — METRONIDAZOLE 500 MG PO TABS: 500.0000 mg | ORAL_TABLET | Freq: Three times a day (TID) | ORAL | 0 refills | Status: AC

## 2018-07-17 NOTE — Progress Notes (Signed)
Patient has discharged to home on 07/17/2018. Discharge instruction including medication and appointment was given to patient. Patient has no question at time time.

## 2018-07-17 NOTE — Discharge Instructions (Signed)
Flush drain with 5-7 mL NS once daily.

## 2018-07-17 NOTE — Final Consult Note (Signed)
Consultant Final Sign-Off Note    Assessment/Final recommendations  Tamara [name] is a 58 y.o. female followed by me for perforated diverticulitis with abscess s/p drain placement on 1/11.  The patient is voiding well, tolerating diet, ambulating well, pain well controlled, vital signs stable, having BM, and felt to be stable for sign off from a surgical standpoint. Recommend total of 14 days of abx. Will have patient call to schedule follow up appointment for shortly after follow up with drain clinic.   Wound care (if applicable):    Diet at discharge: per primary team   Activity at discharge: per primary team   Follow-up appointment:  Will have patient call to schedule follow up appointment for shortly after follow up with drain clinic   Pending results:  Unresulted Labs (From admission, onward)   None       Medication recommendations: 14 total days of abx   Other recommendations:    Thank you for allowing [place] to participate in the care of your patient!  Please consult [place] again if you have further needs for your patient.  Barth Kirks [facility] 07/17/2018 10:34 AM    Subjective  CC: Pain around drain site Patient doing well this morning. Reports abdominal pain has resolved. Only complains of pain in her right gluteus around the drain site. No change in drainage output. Tolerating diet. No N/V. Passing flatus. Soft BMs overnight. Voiding well. Mobilizing.    Objective  Vital signs in last 24 hours: Temp:  [98.7 F (37.1 C)-99.2 F (37.3 C)] 99.2 F (37.3 C) (01/15 2021) Pulse Rate:  [74-105] 92 (01/16 0800) Resp:  [18-20] 20 (01/16 0433) BP: (120-141)/(92-93) 120/93 (01/16 0433) SpO2:  [99 %-100 %] 99 % (01/16 0433)  General: WD, WN, pleasant. NAD Heart: RRR Lungs: CTA b/l Abd: Soft, ND, NT. +BS.    Pertinent labs and Studies: Recent Labs    07/15/18 0405 07/16/18 0319 07/17/18 0415  WBC 5.5 5.8 7.7  HGB 11.1* 11.4* 11.1*  HCT 34.4* 34.2* 33.6*    BMET Recent Labs    07/16/18 0319 07/17/18 0415  NA 140 138  K 3.7 3.6  CL 107 105  CO2 23 25  GLUCOSE 101* 112*  BUN 7 10  CREATININE 0.92 1.02*  CALCIUM 9.4 9.1   No results for input(s): LABURIN in the last 72 hours. Results for orders placed or performed during the hospital encounter of 07/12/18  Aerobic/Anaerobic Culture (surgical/deep wound)     Status: None   Collection Time: 07/12/18  2:39 PM  Result Value Ref Range Status   Specimen Description   Final    JP DRAINAGE Performed at [facility] [address], [place], [place] [address]    Special Requests   Final    NONE Performed at [facility], [place] [address], [place], [place] [address]    Gram Stain   Final    ABUNDANT WBC PRESENT, PREDOMINANTLY PMN RARE GRAM POSITIVE COCCI RARE GRAM NEGATIVE RODS Performed at [facility], [place] [address] [place],  [address]    Culture   Final    MODERATE ESCHERICHIA COLI MIXED ANAEROBIC FLORA PRESENT.  CALL LAB IF FURTHER IID REQUIRED.    Report Status 07/17/2018 FINAL  Final   Organism ID, Bacteria ESCHERICHIA COLI  Final      Susceptibility   Escherichia coli - MIC*    AMPICILLIN >=32 RESISTANT Resistant     CEFAZOLIN <=4 SENSITIVE Sensitive  CEFEPIME <=1 SENSITIVE Sensitive     CEFTAZIDIME <=1 SENSITIVE Sensitive     CEFTRIAXONE <=1 SENSITIVE Sensitive     CIPROFLOXACIN >=4 RESISTANT Resistant     GENTAMICIN <=1 SENSITIVE Sensitive     IMIPENEM <=0.25 SENSITIVE Sensitive     TRIMETH/SULFA >=320 RESISTANT Resistant     AMPICILLIN/SULBACTAM >=32 RESISTANT Resistant     PIP/TAZO <=4 SENSITIVE Sensitive     Extended ESBL NEGATIVE Sensitive     * MODERATE ESCHERICHIA COLI    Imaging: No results found.

## 2018-07-17 NOTE — Progress Notes (Signed)
Referring Physician(s): [place]  Supervising Physician: Arne Cleveland  Patient Status:  [facility] - In-pt  Chief Complaint:  Diverticular abscess  Subjective: Patient scheduled for discharge home today.  No new changes.  Continues to have some mild soreness at drain site as well as posterior right thigh after ambulating.  Allergies: Benadryl [diphenhydramine]; Darvon [propoxyphene]; Dilaudid [hydromorphone hcl]; Gabapentin; Ivp dye [iodinated diagnostic agents]; Levaquin [levofloxacin]; Morphine and related; Penicillins; Propoxyphene hcl; Rocephin [ceftriaxone]; Seroquel [quetiapine fumerate]; Statins; Sulfonamide derivatives; Adhesive [tape]; Dicyclomine; Other; Oxycodone; Oxycontin [oxycodone hcl]; and Robaxin [methocarbamol]  Medications: Prior to Admission medications   Medication Sig Start Date End Date Taking? Authorizing Provider  ALPRAZolam (XANAX XR) 1 MG 24 hr tablet Take 2 mg by mouth at bedtime.    Yes [provider]  ALPRAZolam Duanne Moron) 1 MG tablet Take 0.5-1 mg by mouth 2 (two) times daily as needed for anxiety.   Yes [provider]  aspirin 325 MG tablet Take 1 tablet (325 mg total) by mouth daily. 10/22/13  Yes Bernadene Bell, MD  Cholecalciferol (VITAMIN D3) 50000 UNITS CAPS Take 50,000 Units by mouth once a week. Patient taking differently: Take 50,000 Units by mouth every Saturday.  09/24/14  Yes [name], MD  dexlansoprazole (DEXILANT) 60 MG capsule Take 1 capsule (60 mg total) by mouth daily. 09/02/14  Yes [name], MD  doxycycline (VIBRAMYCIN) 100 MG capsule Take 100 mg by mouth daily.    Yes [provider]  fenofibrate (TRICOR) 145 MG tablet Take 1 tablet (145 mg total) by mouth daily. 05/22/16  Yes Wardell Honour, MD  losartan (COZAAR) 100 MG tablet Take 1 tablet (100 mg total) by mouth daily. 09/07/15  Yes [name], MD  LYSINE PO Take 1 tablet by mouth as needed. skin   Yes [provider]    Oxcarbazepine (TRILEPTAL) 300 MG tablet Take 300 mg by mouth daily.   Yes [provider]  temazepam (RESTORIL) 15 MG capsule Take 15 mg by mouth at bedtime.  01/16/17  Yes [provider]  temazepam (RESTORIL) 30 MG capsule Take 30 mg by mouth at bedtime.   Yes [provider]  Vilazodone HCl (VIIBRYD) 10 MG TABS Take 10 mg by mouth daily.   Yes [provider]  Vilazodone HCl (VIIBRYD) 20 MG TABS Take 20 mg by mouth daily.   Yes [provider]  albuterol (PROVENTIL HFA;VENTOLIN HFA) 108 (90 Base) MCG/ACT inhaler Inhale 2 puffs into the lungs every 6 (six) hours as needed for wheezing or shortness of breath. 07/05/15   Leandrew Koyanagi, MD  cephALEXin (KEFLEX) 500 MG capsule Take 1 capsule (500 mg total) by mouth 4 (four) times daily for 10 days. 07/17/18 07/27/18  Elodia Florence., MD  ciprofloxacin (CIPRO) 500 MG tablet Take 1 tablet (500 mg total) by mouth 2 (two) times daily. Patient not taking: Reported on 07/12/2018 05/05/18   Irene Shipper, MD  metroNIDAZOLE (FLAGYL) 500 MG tablet Take 1 tablet (500 mg total) by mouth 2 (two) times daily. Patient not taking: Reported on 07/12/2018 05/05/18   Irene Shipper, MD  metroNIDAZOLE (FLAGYL) 500 MG tablet Take 1 tablet (500 mg total) by mouth 3 (three) times daily for 10 days. 07/17/18 07/27/18  Elodia Florence., MD  nitroGLYCERIN (NITROSTAT) 0.4 MG SL tablet Place 1 tablet (0.4 mg total) under the tongue every 5 (five) minutes as needed for chest pain. 02/22/17   Croitoru, Mihai, MD  ranitidine (ZANTAC) 150  MG tablet Take 1 tablet (150 mg total) by mouth at bedtime. Patient not taking: Reported on 07/12/2018 06/18/18   Irene Shipper, MD     Vital Signs: BP (!) 120/93   Pulse 92   Temp 99.2 F (37.3 C) (Oral)   Resp 20   Ht 5\' 2"  (1.575 m)   Wt 192 lb 8 oz (87.3 kg)   SpO2 99%   BMI 35.21 kg/m   Physical Exam awake, alert.  Right transgluteal drain intact, insertion site okay, mildly  tender, about 15 to 20 cc of right hazy fluid in drain bag  Imaging: No results found.  Labs:  CBC: Recent Labs    07/14/18 0613 07/15/18 0405 07/16/18 0319 07/17/18 0415  WBC 5.4 5.5 5.8 7.7  HGB 10.8* 11.1* 11.4* 11.1*  HCT 33.2* 34.4* 34.2* 33.6*  PLT 179 211 251 237    COAGS: Recent Labs    03/14/18 2147  INR 1.02  APTT 30    BMP: Recent Labs    07/14/18 0613 07/15/18 0405 07/16/18 0319 07/17/18 0415  NA 139 139 140 138  K 3.6 3.5 3.7 3.6  CL 107 106 107 105  CO2 22 24 23 25   GLUCOSE 84 96 101* 112*  BUN 12 8 7 10   CALCIUM 8.8* 9.2 9.4 9.1  CREATININE 0.94 0.97 0.92 1.02*  GFRNONAA >60 >60 >60 >60  GFRAA >60 >60 >60 >60    LIVER FUNCTION TESTS: Recent Labs    03/14/18 2147 07/12/18 0640 07/17/18 0415  BILITOT 1.1 1.0 0.6  AST 28 20 18   ALT 22 15 15   ALKPHOS 78 64 53  PROT 7.5 6.8 6.4*  ALBUMIN 4.2 4.1 3.4*    Assessment and Plan: Pt with hx diverticular abscess , s/p drainage 07/12/18; afebrile; WBC nl;hgb stable; creat 1.02; cx- e coli; as outpatient recommend once daily irrigation of drain with 5 cc sterile normal saline, output recording and dressing changes every 1 to 2 days. Above instructions were reviewed with patient and output card given to patient with follow-up phone numbers if needed. She will be scheduled for follow-up in the IR drain clinic in 1 to 2 weeks.   Electronically Signed: [name], PA-C 07/17/2018, 1:36 PM   I spent a total of 15 minutes at the the patient's bedside AND on the patient's hospital floor or unit, greater than 50% of which was counseling/coordinating care for pelvic abscess drain    Patient ID: Tamara [name], female   DOB: 1961/01/03, 58 y.o.   MRN: [id number]

## 2018-07-17 NOTE — Discharge Summary (Signed)
Physician Discharge Summary  Tamara [name] TKW:[id number] DOB: 01/28/1961 DOA: 07/12/2018  PCP: Jilda Panda, MD  Admit date: 07/12/2018 Discharge date: 07/18/2018  Time spent: 35 minutes  Recommendations for Outpatient Follow-up:  1. Follow up outpatient CBC/CMP 2. Ensure completion of abx 3. Follow up repeat imaging as outpatient with IR/surgery, will need follow up with IR and surgery as an outpatient   4. Pt with colonoscopy in 2018 with diverticulosis/hemorrhoids, discuss timing for repeat colonoscopy in setting of recurrent diverticulits  Discharge Diagnoses:  Principal Problem:   Diverticulitis of colon Active Problems:   Hyperlipidemia   OBESITY   Anxiety   Depression   Essential hypertension   CKD (chronic kidney disease), stage III (HCC)   PTSD (post-traumatic stress disorder)   Abscess   Discharge Condition: stable  Diet recommendation: heart healthy  Filed Weights   07/12/18 0623 07/12/18 1513  Weight: 85.7 kg 87.3 kg    History of present illness:  Patient is a 58 year old female prior history of diverticulitis with perforation complicated with sepsis during recent hospitalization December 2019, anxiety/PTSD, morbid obesity, GERD presenting to the ED with abdominal pain. Repeat CT abdomen and pelvis concerning for diverticulitis with contained perforation and developing abscess. Patient admitted placed empirically on IV ciprofloxacin and IV Flagyl. General surgery consulted and are following. IR consulted and patient status post CT-guided right pelvic abscess drain placement 07/12/2018.  She's improved s/p drain placement.  She's growing e. Coli sensitive to ancef as well as mixed anaerobes.  She'll be discharged on keflex/flagyl with follow up with IR and surgery as an outpatient.  Hospital Course:  1 acute diverticulitis with contained perforation with developingabscess CT 1/11 with diverticulitis with 6.5 x 3.9 cm air fluid collection c/w contained  perforation and developing abscess  Surgery/IR following S/p per drain by IR on 1/11 Cx growing e. Coli sensitive to ancef (resistant to amp/cipro) as welll as mixed anaerobic flora Plan for 14 days abx with keflex and flagy (additional 10 days at discharge)  2. Depression/anxiety/PTSD Stable. Continue home regimen of Trileptal, and Viibryd. Xanax nightly. Outpatient follow-up.  3. Gastroesophageal reflux disease with moderate sized hiatal hernia Patient complaining that Protonix not effective and has been refusing ordered Protonix. Will DC Protonix. GI cocktail as needed.   4. Hypertension Low bp's improved, follow  5. Hyperlipidemia Resume TriCor on discharge.  6. Obesity  Procedures: CT guided drain placement 1/11  Consultations:  Surgery  IR  Discharge Exam: Vitals:   07/17/18 0800 07/17/18 1433  BP:  115/79  Pulse: 92 87  Resp:    Temp:  98.7 F (37.1 C)  SpO2:  100%   Feeling well. Some soreness near drain. Ready for discharge home.  General: No acute distress. Cardiovascular: Heart sounds show a regular rate, and rhythm. Lungs: Clear to auscultation bilaterally Abdomen: Soft, nontender, nondistended.  Drain in place. Neurological: Alert and oriented 3. Moves all extremities 4. Cranial nerves II through XII grossly intact. Skin: Warm and dry. No rashes or lesions. Extremities: No clubbing or cyanosis. No edema Discharge Instructions   Discharge Instructions    Call MD for:  difficulty breathing, headache or visual disturbances   Complete by:  As directed    Call MD for:  persistant dizziness or light-headedness   Complete by:  As directed    Call MD for:  persistant nausea and vomiting   Complete by:  As directed    Call MD for:  redness, tenderness, or signs of infection (pain, swelling, redness,  odor or green/yellow discharge around incision site)   Complete by:  As directed    Call MD for:  severe uncontrolled pain   Complete by:   As directed    Call MD for:  temperature >100.4   Complete by:  As directed    Diet - low sodium heart healthy   Complete by:  As directed    Discharge instructions   Complete by:  As directed    You were seen for perforated diverticulitis.  You had a drain placed by interventional radiology.  You should have follow up imaging and for the drain with interventional radiology.  Please follow up with general surgery.  Follow up with your PCP.  Discuss timing for colonoscopy with them.  We'll send you home with an additional 10 days of antibiotics (keflex and flagyl).  Return for new, recurrent, or worsening symptoms.  Please ask your PCP to request records from this hospitalization so they know what was done and what the next steps will be.   Face-to-face encounter (required for Medicare/Medicaid patients)   Complete by:  As directed    I Fayrene Helper certify that this patient is under my care and that I, or a nurse practitioner or physician's assistant working with me, had a face-to-face encounter that meets the physician face-to-face encounter requirements with this patient on 07/17/2018. The encounter with the patient was in whole, or in part for the following medical condition(s) which is the primary reason for home health care (List medical condition): diverticulitis   The encounter with the patient was in whole, or in part, for the following medical condition, which is the primary reason for home health care:  diverticulitis   I certify that, based on my findings, the following services are medically necessary home health services:  Nursing   Reason for Medically Necessary Home Health Services:  Skilled Nursing- Post-Surgical Wound Assessment and Care   My clinical findings support the need for the above services:  Pain interferes with ambulation/mobility   Further, I certify that my clinical findings support that this patient is homebound due to:  Pain interferes with  ambulation/mobility   Home Health   Complete by:  As directed    To provide the following care/treatments:  RN   Increase activity slowly   Complete by:  As directed      Allergies as of 07/17/2018      Reactions   Benadryl [diphenhydramine] Anaphylaxis   Darvon [propoxyphene] Other (See Comments)   Hallucinations   Dilaudid [hydromorphone Hcl]    HALLUCINATIONS   Gabapentin    Unknown reaction, patient cannot remember    Ivp Dye [iodinated Diagnostic Agents] Anaphylaxis, Swelling   Levaquin [levofloxacin] Nausea And Vomiting   Patient states she tolerates Cipro   Morphine And Related Itching   Penicillins Rash   **Tolerated cefazolin 07/15/18** Has patient had a PCN reaction causing immediate rash, facial/tongue/throat swelling, SOB or lightheadedness with hypotension: yes Has patient had a PCN reaction causing severe rash involving mucus membranes or skin necrosis: yes Has patient had a PCN reaction that required hospitalization: No Has patient had a PCN reaction occurring within the last 10 years: yes If all of the above answers are "NO", then may proceed with Cephalosporin use.   Propoxyphene Hcl Other (See Comments)   hallucination   Rocephin [ceftriaxone] Rash   Welts; Pt said she tolerates keflex Tolerated cefazolin 07/15/18   Seroquel [quetiapine Fumerate]    Rectal bleeding, abdominal cramping  Statins Other (See Comments)   Severe mm pain (esp. Arms) >> DOES NOT WANT TO RETRY!   Sulfonamide Derivatives Rash   Welts   Adhesive [tape]    This is tape as well as adhesive on bandaids.   Dicyclomine    Vision loss in one eye   Other    All Anti-Histamines   Oxycodone Other (See Comments)   amnesia   Oxycontin [oxycodone Hcl]    Amnesia   Robaxin [methocarbamol]    GI upset      Medication List    STOP taking these medications   ciprofloxacin 500 MG tablet Commonly known as:  CIPRO     TAKE these medications   albuterol 108 (90 Base) MCG/ACT  inhaler Commonly known as:  PROVENTIL HFA;VENTOLIN HFA Inhale 2 puffs into the lungs every 6 (six) hours as needed for wheezing or shortness of breath.   ALPRAZolam 1 MG 24 hr tablet Commonly known as:  XANAX XR Take 2 mg by mouth at bedtime.   ALPRAZolam 1 MG tablet Commonly known as:  XANAX Take 0.5-1 mg by mouth 2 (two) times daily as needed for anxiety.   aspirin 325 MG tablet Take 1 tablet (325 mg total) by mouth daily.   cephALEXin 500 MG capsule Commonly known as:  KEFLEX Take 1 capsule (500 mg total) by mouth 4 (four) times daily for 10 days.   dexlansoprazole 60 MG capsule Commonly known as:  DEXILANT Take 1 capsule (60 mg total) by mouth daily.   doxycycline 100 MG capsule Commonly known as:  VIBRAMYCIN Take 100 mg by mouth daily.   fenofibrate 145 MG tablet Commonly known as:  TRICOR Take 1 tablet (145 mg total) by mouth daily.   losartan 100 MG tablet Commonly known as:  COZAAR Take 1 tablet (100 mg total) by mouth daily.   LYSINE PO Take 1 tablet by mouth as needed. skin   metroNIDAZOLE 500 MG tablet Commonly known as:  FLAGYL Take 1 tablet (500 mg total) by mouth 3 (three) times daily for 10 days. What changed:  when to take this   nitroGLYCERIN 0.4 MG SL tablet Commonly known as:  NITROSTAT Place 1 tablet (0.4 mg total) under the tongue every 5 (five) minutes as needed for chest pain.   Oxcarbazepine 300 MG tablet Commonly known as:  TRILEPTAL Take 300 mg by mouth daily.   ranitidine 150 MG tablet Commonly known as:  ZANTAC Take 1 tablet (150 mg total) by mouth at bedtime.   temazepam 30 MG capsule Commonly known as:  RESTORIL Take 30 mg by mouth at bedtime.   temazepam 15 MG capsule Commonly known as:  RESTORIL Take 15 mg by mouth at bedtime.   VIIBRYD 10 MG Tabs Generic drug:  Vilazodone HCl Take 10 mg by mouth daily.   VIIBRYD 20 MG Tabs Generic drug:  Vilazodone HCl Take 20 mg by mouth daily.   Vitamin D3 1.25 MG (50000 UT)  Caps Take 50,000 Units by mouth once a week. What changed:  when to take this      Allergies  Allergen Reactions  . Benadryl [Diphenhydramine] Anaphylaxis  . Darvon [Propoxyphene] Other (See Comments)    Hallucinations   . Dilaudid [Hydromorphone Hcl]     HALLUCINATIONS  . Gabapentin     Unknown reaction, patient cannot remember   . Ivp Dye [Iodinated Diagnostic Agents] Anaphylaxis and Swelling  . Levaquin [Levofloxacin] Nausea And Vomiting    Patient states she tolerates Cipro  .  Morphine And Related Itching  . Penicillins Rash    **Tolerated cefazolin 07/15/18**  Has patient had a PCN reaction causing immediate rash, facial/tongue/throat swelling, SOB or lightheadedness with hypotension: yes Has patient had a PCN reaction causing severe rash involving mucus membranes or skin necrosis: yes Has patient had a PCN reaction that required hospitalization: No Has patient had a PCN reaction occurring within the last 10 years: yes If all of the above answers are "NO", then may proceed with Cephalosporin use.   Marland Kitchen Propoxyphene Hcl Other (See Comments)    hallucination  . Rocephin [Ceftriaxone] Rash    Welts; Pt said she tolerates keflex Tolerated cefazolin 07/15/18  . Seroquel [Quetiapine Fumerate]     Rectal bleeding, abdominal cramping  . Statins Other (See Comments)    Severe mm pain (esp. Arms) >> DOES NOT WANT TO RETRY!  . Sulfonamide Derivatives Rash    Welts   . Adhesive [Tape]     This is tape as well as adhesive on bandaids.  . Dicyclomine     Vision loss in one eye  . Other     All Anti-Histamines  . Oxycodone Other (See Comments)    amnesia  . Oxycontin [Oxycodone Hcl]     Amnesia  . Robaxin [Methocarbamol]     GI upset    Follow-up Information    Alphonsa Overall, MD Follow up.   Specialty:  General Surgery Why:  Call for follow up appointment as soon as you know date of next CT. Aim for date just after your CT. Contact information: [address] [place] [address] [phone]        Jilda Panda, MD Follow up.   Specialty:  Internal Medicine Why:  CAll and follow up.  They will need to see you and set up a with a colonoscopy in 6-8 weeks. Contact information: [address] [place] [address] [phone]        Arne Cleveland, MD. Go in 2 week(s).   Specialties:  Interventional Radiology, Radiology Why:  Office will call you to schedule follow up appointment which will include a CT scan. Contact information: [address] [place] [address] [phone]            The results of significant diagnostics from this hospitalization (including imaging, microbiology, ancillary and laboratory) are listed below for reference.    Significant Diagnostic Studies: Ct Abdomen Pelvis Wo Contrast  Result Date: 07/12/2018 CLINICAL DATA:  Lower abdominal pain. EXAM: CT ABDOMEN AND PELVIS WITHOUT CONTRAST TECHNIQUE: Multidetector CT imaging of the abdomen and pelvis was performed following the standard protocol without IV contrast. COMPARISON:  CT scan of May 15, 2018. FINDINGS: Lower chest: Visualized lung bases are unremarkable. Moderate size sliding-type hiatal hernia is noted. Hepatobiliary: Status post cholecystectomy. Probable fatty infiltration of the liver. No biliary dilatation is noted. Pancreas: Unremarkable. No pancreatic ductal dilatation or surrounding inflammatory changes. Spleen: Normal in size without focal abnormality. Adrenals/Urinary Tract: Adrenal glands are unremarkable. Kidneys are normal, without renal calculi, focal lesion, or hydronephrosis. Bladder is unremarkable. Stomach/Bowel: The stomach and appendix are unremarkable. There is no evidence of bowel obstruction. Worsening distal sigmoid diverticulitis is noted with adjacent 6.5 x 3.9 cm air collection with small fluid level, consistent with contained perforation and developing abscess. The perforation has a very wide neck  measuring 1.4 cm. Vascular/Lymphatic: Aortic atherosclerosis. No enlarged abdominal or pelvic lymph nodes. Reproductive: Status post hysterectomy. No adnexal masses. Other: No abdominal  wall hernia or abnormality. No abdominopelvic ascites. Musculoskeletal: No acute or significant osseous findings. IMPRESSION: Distal sigmoid diverticulitis is noted which is significantly progressed compared to prior exam, with adjacent 6.5 x 3.9 cm air-fluid collection consistent with contained perforation and developing abscess. The perforation has a wide opening in the bowel wall, measuring 1.4 cm at least. Hepatic steatosis. Moderate size sliding-type hiatal hernia. Aortic Atherosclerosis (ICD10-I70.0). Electronically Signed   By: Marijo Conception, M.D.   On: 07/12/2018 08:24   Ct Image Guided Drainage By Percutaneous Catheter  Result Date: 07/12/2018 CLINICAL DATA:  Diverticular abscess.  Drainage requested. EXAM: CT GUIDED DRAINAGE OF RIGHT PELVIC ABSCESS ANESTHESIA/SEDATION: Intravenous Fentanyl and Versed were administered as conscious sedation during continuous monitoring of the patient's level of consciousness and physiological / cardiorespiratory status by the radiology RN, with a total moderate sedation time of 30 minutes. PROCEDURE: The procedure, risks, benefits, and alternatives were explained to the patient. Questions regarding the procedure were encouraged and answered. The patient understands and consents to the procedure. Patient placed prone. Select axial scans through the pelvis were obtained. The gas and fluid collection was localized and an appropriate skin entry site was determined and marked. The operative field was prepped with chlorhexidinein a sterile fashion, and a sterile drape was applied covering the operative field. A sterile gown and sterile gloves were used for the procedure. Local anesthesia was provided with 1% Lidocaine. Under CT fluoroscopic guidance, an 18 gauge trocar needle was advanced  into the collection. Gas and thin cloudy fluid could be aspirated. A guidewire advanced easily within the collection, its position confirmed on CT. Tract dilated to facilitate placement of a 12 French pigtail drain catheter, formed centrally within the collection. CT confirms appropriate catheter positioning. Catheter secured externally with 0 Prolene suture and StatLock and placed to gravity drain bag. A sample of the aspirate was sent for Gram stain and culture. The patient tolerated the procedure well. COMPLICATIONS: None immediate FINDINGS: Gas and fluid collection in the deep right pelvis was again localized. Transgluteal 12 French pigtail drain catheter placed as above. Sample of the aspirate sent for Gram stain and culture. IMPRESSION: 1. Technically successful CT-guided right pelvic abscess drain catheter placement Electronically Signed   By: Lucrezia Europe M.D.   On: 07/12/2018 14:58    Microbiology: Recent Results (from the past 240 hour(s))  Aerobic/Anaerobic Culture (surgical/deep wound)     Status: None   Collection Time: 07/12/18  2:39 PM  Result Value Ref Range Status   Specimen Description   Final    JP DRAINAGE Performed at [facility] [address] [place], [place] [address]    Special Requests   Final    NONE Performed at [facility], [place] [address] [place], [place] [address]    Gram Stain   Final    ABUNDANT WBC PRESENT, PREDOMINANTLY PMN RARE GRAM POSITIVE COCCI RARE GRAM NEGATIVE RODS Performed at [facility], [place] [address], [place], [place] [address]    Culture   Final    MODERATE ESCHERICHIA COLI MIXED ANAEROBIC FLORA PRESENT.  CALL LAB IF FURTHER IID REQUIRED.    Report Status 07/17/2018 FINAL  Final   Organism ID, Bacteria ESCHERICHIA COLI  Final      Susceptibility   Escherichia coli - MIC*    AMPICILLIN >=32 RESISTANT Resistant     CEFAZOLIN <=4 SENSITIVE Sensitive     CEFEPIME <=1 SENSITIVE Sensitive      CEFTAZIDIME <=1  SENSITIVE Sensitive     CEFTRIAXONE <=1 SENSITIVE Sensitive     CIPROFLOXACIN >=4 RESISTANT Resistant     GENTAMICIN <=1 SENSITIVE Sensitive     IMIPENEM <=0.25 SENSITIVE Sensitive     TRIMETH/SULFA >=320 RESISTANT Resistant     AMPICILLIN/SULBACTAM >=32 RESISTANT Resistant     PIP/TAZO <=4 SENSITIVE Sensitive     Extended ESBL NEGATIVE Sensitive     * MODERATE ESCHERICHIA COLI     Labs: Basic Metabolic Panel: Recent Labs  Lab 07/13/18 0543 07/14/18 0613 07/15/18 0405 07/16/18 0319 07/17/18 0415  NA 138 139 139 140 138  K 3.7 3.6 3.5 3.7 3.6  CL 105 107 106 107 105  CO2 26 22 24 23 25   GLUCOSE 100* 84 96 101* 112*  BUN 16 12 8 7 10   CREATININE 1.00 0.94 0.97 0.92 1.02*  CALCIUM 9.0 8.8* 9.2 9.4 9.1  MG  --   --   --   --  1.8   Liver Function Tests: Recent Labs  Lab 07/12/18 0640 07/17/18 0415  AST 20 18  ALT 15 15  ALKPHOS 64 53  BILITOT 1.0 0.6  PROT 6.8 6.4*  ALBUMIN 4.1 3.4*   Recent Labs  Lab 07/12/18 0640  LIPASE 56*   No results for input(s): AMMONIA in the last 168 hours. CBC: Recent Labs  Lab 07/13/18 0543 07/14/18 0613 07/15/18 0405 07/16/18 0319 07/17/18 0415  WBC 6.5 5.4 5.5 5.8 7.7  NEUTROABS  --  3.5  --   --   --   HGB 11.4* 10.8* 11.1* 11.4* 11.1*  HCT 35.8* 33.2* 34.4* 34.2* 33.6*  MCV 87.7 88.8 87.8 87.5 87.5  PLT 192 179 211 251 237   Cardiac Enzymes: No results for input(s): CKTOTAL, CKMB, CKMBINDEX, TROPONINI in the last 168 hours. BNP: BNP (last 3 results) No results for input(s): BNP in the last 8760 hours.  ProBNP (last 3 results) No results for input(s): PROBNP in the last 8760 hours.  CBG: No results for input(s): GLUCAP in the last 168 hours.     Signed:  Fayrene Helper MD.  [facility] 07/18/2018, 12:12 AM

## 2018-07-22 DIAGNOSIS — F4310 Post-traumatic stress disorder, unspecified: Secondary | ICD-10-CM | POA: Diagnosis not present

## 2018-07-24 ENCOUNTER — Ambulatory Visit
Admission: RE | Admit: 2018-07-24 | Discharge: 2018-07-24 | Disposition: A | Discharge status: Home / Self Care | Payer: PRIVATE HEALTH INSURANCE | Source: Ambulatory Visit | Attending: Radiology | Admitting: Radiology

## 2018-07-24 ENCOUNTER — Other Ambulatory Visit: Payer: Medicare Other

## 2018-07-24 ENCOUNTER — Ambulatory Visit
Admission: RE | Admit: 2018-07-24 | Discharge: 2018-07-24 | Disposition: A | Discharge status: Home / Self Care | Payer: PRIVATE HEALTH INSURANCE | Source: Ambulatory Visit | Attending: Surgery | Admitting: Surgery

## 2018-07-24 DIAGNOSIS — K651 Peritoneal abscess: Secondary | ICD-10-CM | POA: Diagnosis not present

## 2018-07-24 DIAGNOSIS — K5720 Diverticulitis of large intestine with perforation and abscess without bleeding: Secondary | ICD-10-CM | POA: Diagnosis not present

## 2018-07-24 HISTORY — PX: IR RADIOLOGIST EVAL & MGMT: IMG5224

## 2018-07-24 NOTE — Progress Notes (Signed)
Chief Complaint: Status post percutaneous catheter drainage a distal sigmoid diverticular abscess on 07/12/2018.  History of Present Illness: Tamara [name] is a 58 y.o. female who presented with recurrent sigmoid diverticulitis and a large associated pelvic abscess adjacent to the distal sigmoid colon near the rectosigmoid junction.  This was drained percutaneously by catheter drainage under CT guidance by Dr. Vernard Gambles on 1/11 with placement of a 12 French drain.  She was discharged from the hospital on 07/17/2018 and remains on oral antibiotics.  Return from the drainage catheter has gradually decreased with only approximately 10 mL of drainage every other day of a clear, slightly pink fluid.  She denies fever or significant abdominal pain.  She is tolerating a regular diet.  Tamara [name] has follow-up appointments with Dr. Lucia Gaskins on 08/01/2018 and Dr. Henrene Pastor on 08/12/2018.  Past Medical History:  Diagnosis Date  . Allergy   . Anemia   . Anxiety   . Asthma   . Depression   . Diverticulitis   . GERD (gastroesophageal reflux disease)   . History of short term memory loss   . HTN (hypertension)   . Hyperlipemia   . Neuromuscular disorder ([place])   . PTSD (post-traumatic stress disorder)   . Shock therapy as cause of abnormal reaction of patient or of later complication without mention of misadventure at time of procedure     Past Surgical History:  Procedure Laterality Date  . ABDOMINAL HYSTERECTOMY     partial  . CHOLECYSTECTOMY    . COLONOSCOPY    . IR RADIOLOGIST EVAL & MGMT  07/24/2018  . OTHER SURGICAL HISTORY     electric shock therapy  . TUBAL LIGATION    . UPPER GASTROINTESTINAL ENDOSCOPY      Allergies: Benadryl [diphenhydramine]; Darvon [propoxyphene]; Dilaudid [hydromorphone hcl]; Gabapentin; Ivp dye [iodinated diagnostic agents]; Levaquin [levofloxacin]; Morphine and related; Penicillins; Propoxyphene hcl; Rocephin [ceftriaxone]; Seroquel [quetiapine fumerate];  Statins; Sulfonamide derivatives; Adhesive [tape]; Dicyclomine; Other; Oxycodone; Oxycontin [oxycodone hcl]; and Robaxin [methocarbamol]  Medications: Prior to Admission medications   Medication Sig Start Date End Date Taking? Authorizing Provider  albuterol (PROVENTIL HFA;VENTOLIN HFA) 108 (90 Base) MCG/ACT inhaler Inhale 2 puffs into the lungs every 6 (six) hours as needed for wheezing or shortness of breath. 07/05/15   Leandrew Koyanagi, MD  ALPRAZolam (XANAX XR) 1 MG 24 hr tablet Take 2 mg by mouth at bedtime.     [provider]  ALPRAZolam Duanne Moron) 1 MG tablet Take 0.5-1 mg by mouth 2 (two) times daily as needed for anxiety.    [provider]  aspirin 325 MG tablet Take 1 tablet (325 mg total) by mouth daily. 10/22/13   Bernadene Bell, MD  cephALEXin (KEFLEX) 500 MG capsule Take 1 capsule (500 mg total) by mouth 4 (four) times daily for 10 days. 07/17/18 07/27/18  Elodia Florence., MD  Cholecalciferol (VITAMIN D3) 50000 UNITS CAPS Take 50,000 Units by mouth once a week. Patient taking differently: Take 50,000 Units by mouth every Saturday.  09/24/14   [name], MD  dexlansoprazole (DEXILANT) 60 MG capsule Take 1 capsule (60 mg total) by mouth daily. 09/02/14   [name], MD  doxycycline (VIBRAMYCIN) 100 MG capsule Take 100 mg by mouth daily.     [provider]  fenofibrate (TRICOR) 145 MG tablet Take 1 tablet (145 mg total) by mouth daily. 05/22/16   Wardell Honour, MD  losartan (COZAAR) 100 MG tablet Take 1 tablet (100 mg  total) by mouth daily. 09/07/15   [name], MD  LYSINE PO Take 1 tablet by mouth as needed. skin    [provider]  metroNIDAZOLE (FLAGYL) 500 MG tablet Take 1 tablet (500 mg total) by mouth 3 (three) times daily for 10 days. 07/17/18 07/27/18  Elodia Florence., MD  nitroGLYCERIN (NITROSTAT) 0.4 MG SL tablet Place 1 tablet (0.4 mg total) under the tongue every 5 (five) minutes as needed for chest pain.  02/22/17   Croitoru, Mihai, MD  Oxcarbazepine (TRILEPTAL) 300 MG tablet Take 300 mg by mouth daily.    [provider]  ranitidine (ZANTAC) 150 MG tablet Take 1 tablet (150 mg total) by mouth at bedtime. Patient not taking: Reported on 07/12/2018 06/18/18   Irene Shipper, MD  temazepam (RESTORIL) 15 MG capsule Take 15 mg by mouth at bedtime.  01/16/17   [provider]  temazepam (RESTORIL) 30 MG capsule Take 30 mg by mouth at bedtime.    [provider]  Vilazodone HCl (VIIBRYD) 10 MG TABS Take 10 mg by mouth daily.    [provider]  Vilazodone HCl (VIIBRYD) 20 MG TABS Take 20 mg by mouth daily.    [provider]     Family History  Problem Relation Age of Onset  . Arrhythmia Father   . Stroke Father   . Hyperlipidemia Father   . Heart disease Father   . Hypertension Father   . COPD Father   . Coronary artery disease Maternal Grandfather 40       Died 58  . Heart disease Maternal Grandfather   . Hypertension Maternal Grandfather   . Cancer Mother        bone marrow  . Hyperlipidemia Mother   . Hypertension Mother   . Hearing loss Paternal Grandmother   . Hearing loss Paternal Grandfather   . Diabetes Paternal Grandfather   . Diabetes Daughter   . Colon cancer Paternal Uncle   . Hyperlipidemia Brother   . Hyperlipidemia Maternal Grandmother   . Esophageal cancer Neg Hx   . Rectal cancer Neg Hx   . Stomach cancer Neg Hx     Social History   Socioeconomic History  . Marital status: Divorced    Spouse name: Not on file  . Number of children: 3  . Years of education: college  . Highest education level: Not on file  Occupational History    Employer: UNEMPLOYED    Comment: Disabled  Social Needs  . Financial resource strain: Not on file  . Food insecurity:    Worry: Not on file    Inability: Not on file  . Transportation needs:    Medical: Not on file    Non-medical: Not on file  Tobacco Use  . Smoking status: Never  Smoker  . Smokeless tobacco: Never Used  Substance and Sexual Activity  . Alcohol use: No    Alcohol/week: 0.0 standard drinks  . Drug use: No  . Sexual activity: Yes    Birth control/protection: None  Lifestyle  . Physical activity:    Days per week: Not on file    Minutes per session: Not on file  . Stress: Not on file  Relationships  . Social connections:    Talks on phone: Not on file    Gets together: Not on file    Attends religious service: Not on file    Active member of club or organization: Not on file    Attends  meetings of clubs or organizations: Not on file    Relationship status: Not on file  Other Topics Concern  . Not on file  Social History Narrative   Lives with boyfriend. Walks daily for 20 minutes. Education: [employer].    Review of Systems: A 12 point ROS discussed and pertinent positives are indicated in the HPI above.  All other systems are negative.  Review of Systems  Vital Signs: BP 114/83   Pulse 74   Temp 98.1 F (36.7 C)   SpO2 96%   Physical Exam   Imaging: Ct Abdomen Pelvis Wo Contrast  Result Date: 07/24/2018 CLINICAL DATA:  Sigmoid diverticulitis with abscess and status post percutaneous catheter drainage of diverticular abscess on 07/12/2018. There is no longer any significant fluid drainage from the catheter and follow-up CT is performed. EXAM: CT ABDOMEN AND PELVIS WITHOUT CONTRAST TECHNIQUE: Multidetector CT imaging of the abdomen and pelvis was performed following the standard protocol without IV contrast. COMPARISON:  07/12/2018 FINDINGS: Lower chest: No acute abnormality. Hepatobiliary: No focal liver abnormality is seen. Status post cholecystectomy. No biliary dilatation. Pancreas: Unremarkable. No pancreatic ductal dilatation or surrounding inflammatory changes. Spleen: Normal in size without focal abnormality. Adrenals/Urinary Tract: Adrenal glands are unremarkable. Kidneys are normal, without renal calculi, focal lesion, or  hydronephrosis. Bladder is unremarkable. Stomach/Bowel: Bowel shows no evidence of obstruction, ileus or inflammation. Abscess cavity at the level of the distal sigmoid and rectosigmoid junction has completely resolved with drainage catheter remaining in stable position from a right transgluteal approach. No significant surrounding inflammation identified. Vascular/Lymphatic: No significant vascular findings are present. No enlarged abdominal or pelvic lymph nodes. Reproductive: Status post hysterectomy. No adnexal masses. Other: No abdominal wall hernia or abnormality. No abdominopelvic ascites. No new abscess identified. Musculoskeletal: No acute or significant osseous findings. IMPRESSION: Complete resolution of sigmoid/rectosigmoid diverticular abscess by CT after percutaneous catheter drainage. No significant residual inflammation identified. The drainage catheter will be injected under fluoroscopy to evaluate for any fistula to the adjacent colon. This injection procedure will be dictated separately. Electronically Signed   By: Aletta Edouard M.D.   On: 07/24/2018 09:18   Ct Abdomen Pelvis Wo Contrast  Result Date: 07/12/2018 CLINICAL DATA:  Lower abdominal pain. EXAM: CT ABDOMEN AND PELVIS WITHOUT CONTRAST TECHNIQUE: Multidetector CT imaging of the abdomen and pelvis was performed following the standard protocol without IV contrast. COMPARISON:  CT scan of May 15, 2018. FINDINGS: Lower chest: Visualized lung bases are unremarkable. Moderate size sliding-type hiatal hernia is noted. Hepatobiliary: Status post cholecystectomy. Probable fatty infiltration of the liver. No biliary dilatation is noted. Pancreas: Unremarkable. No pancreatic ductal dilatation or surrounding inflammatory changes. Spleen: Normal in size without focal abnormality. Adrenals/Urinary Tract: Adrenal glands are unremarkable. Kidneys are normal, without renal calculi, focal lesion, or hydronephrosis. Bladder is unremarkable.  Stomach/Bowel: The stomach and appendix are unremarkable. There is no evidence of bowel obstruction. Worsening distal sigmoid diverticulitis is noted with adjacent 6.5 x 3.9 cm air collection with small fluid level, consistent with contained perforation and developing abscess. The perforation has a very wide neck measuring 1.4 cm. Vascular/Lymphatic: Aortic atherosclerosis. No enlarged abdominal or pelvic lymph nodes. Reproductive: Status post hysterectomy. No adnexal masses. Other: No abdominal wall hernia or abnormality. No abdominopelvic ascites. Musculoskeletal: No acute or significant osseous findings. IMPRESSION: Distal sigmoid diverticulitis is noted which is significantly progressed compared to prior exam, with adjacent 6.5 x 3.9 cm air-fluid collection consistent with contained perforation and developing abscess. The perforation has a wide opening  in the bowel wall, measuring 1.4 cm at least. Hepatic steatosis. Moderate size sliding-type hiatal hernia. Aortic Atherosclerosis (ICD10-I70.0). Electronically Signed   By: Marijo Conception, M.D.   On: 07/12/2018 08:24   Dg Sinus/fist Tube Chk-non Gi  Result Date: 07/24/2018 INDICATION: Sigmoid diverticulitis with abscess and status post percutaneous catheter drainage of diverticular abscess on 07/12/2018. CT today has demonstrated resolution of the abscess cavity and there is no significant fluid currently draining from the catheter with minimal clear fluid drainage presently. Injection of the drain is now performed to evaluate for fistula prior to potential catheter removal. EXAM: INJECTION OF PERCUTANEOUS ABSCESS DRAIN UNDER FLUOROSCOPY CONTRAST:  5 mL Omnipaque 300 injected into abscess cavity FLUOROSCOPY TIME:  48 seconds.  23.2 mGy. MEDICATIONS: None ANESTHESIA/SEDATION: None COMPLICATIONS: None immediate. PROCEDURE: Under fluoroscopy, contrast was injected via the pre-existing right transgluteal percutaneous abscess drainage catheter. Multiple  fluoroscopic images were obtained in different projections. The contrast was aspirated from the cavity. The catheter was then cut and removed. A dressing was applied over the catheter exit site. FINDINGS: Injection of the drain demonstrates filling a small cavity conforming to the pigtail portion of the drainage catheter. There is no demonstrated fistula to the adjacent colon or elsewhere in the pelvis. Given findings, the drainage catheter was removed following injection. IMPRESSION: Injection of the diverticular abscess drain with contrast under fluoroscopy demonstrates no evidence of fistula to adjacent bowel. Given findings as well as CT findings and lack of significant drain output, the drain was removed today. Electronically Signed   By: Aletta Edouard M.D.   On: 07/24/2018 10:21   Ct Image Guided Drainage By Percutaneous Catheter  Result Date: 07/12/2018 CLINICAL DATA:  Diverticular abscess.  Drainage requested. EXAM: CT GUIDED DRAINAGE OF RIGHT PELVIC ABSCESS ANESTHESIA/SEDATION: Intravenous Fentanyl and Versed were administered as conscious sedation during continuous monitoring of the patient's level of consciousness and physiological / cardiorespiratory status by the radiology RN, with a total moderate sedation time of 30 minutes. PROCEDURE: The procedure, risks, benefits, and alternatives were explained to the patient. Questions regarding the procedure were encouraged and answered. The patient understands and consents to the procedure. Patient placed prone. Select axial scans through the pelvis were obtained. The gas and fluid collection was localized and an appropriate skin entry site was determined and marked. The operative field was prepped with chlorhexidinein a sterile fashion, and a sterile drape was applied covering the operative field. A sterile gown and sterile gloves were used for the procedure. Local anesthesia was provided with 1% Lidocaine. Under CT fluoroscopic guidance, an 18 gauge  trocar needle was advanced into the collection. Gas and thin cloudy fluid could be aspirated. A guidewire advanced easily within the collection, its position confirmed on CT. Tract dilated to facilitate placement of a 12 French pigtail drain catheter, formed centrally within the collection. CT confirms appropriate catheter positioning. Catheter secured externally with 0 Prolene suture and StatLock and placed to gravity drain bag. A sample of the aspirate was sent for Gram stain and culture. The patient tolerated the procedure well. COMPLICATIONS: None immediate FINDINGS: Gas and fluid collection in the deep right pelvis was again localized. Transgluteal 12 French pigtail drain catheter placed as above. Sample of the aspirate sent for Gram stain and culture. IMPRESSION: 1. Technically successful CT-guided right pelvic abscess drain catheter placement Electronically Signed   By: Lucrezia Europe M.D.   On: 07/12/2018 14:58   Ir Radiologist Eval & Mgmt  Result Date: 07/24/2018 Please refer to  notes tab for details about interventional procedure. (Op Note)   Labs:  CBC: Recent Labs    07/14/18 0613 07/15/18 0405 07/16/18 0319 07/17/18 0415  WBC 5.4 5.5 5.8 7.7  HGB 10.8* 11.1* 11.4* 11.1*  HCT 33.2* 34.4* 34.2* 33.6*  PLT 179 211 251 237    COAGS: Recent Labs    03/14/18 2147  INR 1.02  APTT 30    BMP: Recent Labs    07/14/18 0613 07/15/18 0405 07/16/18 0319 07/17/18 0415  NA 139 139 140 138  K 3.6 3.5 3.7 3.6  CL 107 106 107 105  CO2 22 24 23 25   GLUCOSE 84 96 101* 112*  BUN 12 8 7 10   CALCIUM 8.8* 9.2 9.4 9.1  CREATININE 0.94 0.97 0.92 1.02*  GFRNONAA >60 >60 >60 >60  GFRAA >60 >60 >60 >60    LIVER FUNCTION TESTS: Recent Labs    03/14/18 2147 07/12/18 0640 07/17/18 0415  BILITOT 1.1 1.0 0.6  AST 28 20 18   ALT 22 15 15   ALKPHOS 78 64 53  PROT 7.5 6.8 6.4*  ALBUMIN 4.2 4.1 3.4*    Assessment and Plan:  CT today demonstrates complete resolution of the  diverticular abscess with stable positioning of the percutaneous drain.  No new abscess is identified.  There is no significant inflammation in the pelvis.  Contrast injection of the drainage catheter was also performed under fluoroscopy demonstrating a small cavity corresponding to the pigtail drain and no evidence of fistula to the adjacent colon.  Given imaging findings as well as no significant output from the drain, the percutaneous drain was removed today without difficulty.  The patient will keep her upcoming follow-up appointments to discuss further treatment options.  Electronically Signed: Azzie Roup 07/24/2018, 11:40 AM   I spent a total of  15 Minutes in face to face in clinical consultation, greater than 50% of which was counseling/coordinating care for diverticular abscess drainage.

## 2018-07-29 ENCOUNTER — Other Ambulatory Visit: Payer: Medicare Other

## 2018-07-29 DIAGNOSIS — F4310 Post-traumatic stress disorder, unspecified: Secondary | ICD-10-CM | POA: Diagnosis not present

## 2018-07-31 DIAGNOSIS — F4310 Post-traumatic stress disorder, unspecified: Secondary | ICD-10-CM | POA: Diagnosis not present

## 2018-08-01 DIAGNOSIS — K5792 Diverticulitis of intestine, part unspecified, without perforation or abscess without bleeding: Secondary | ICD-10-CM | POA: Diagnosis not present

## 2018-08-03 DIAGNOSIS — K5780 Diverticulitis of intestine, part unspecified, with perforation and abscess without bleeding: Secondary | ICD-10-CM | POA: Diagnosis not present

## 2018-08-04 DIAGNOSIS — K5780 Diverticulitis of intestine, part unspecified, with perforation and abscess without bleeding: Secondary | ICD-10-CM | POA: Diagnosis not present

## 2018-08-11 DIAGNOSIS — N183 Chronic kidney disease, stage 3 (moderate): Secondary | ICD-10-CM | POA: Diagnosis not present

## 2018-08-11 DIAGNOSIS — R7301 Impaired fasting glucose: Secondary | ICD-10-CM | POA: Diagnosis not present

## 2018-08-11 DIAGNOSIS — Z0000 Encounter for general adult medical examination without abnormal findings: Secondary | ICD-10-CM | POA: Diagnosis not present

## 2018-08-11 DIAGNOSIS — Z6833 Body mass index (BMI) 33.0-33.9, adult: Secondary | ICD-10-CM | POA: Diagnosis not present

## 2018-08-11 DIAGNOSIS — E7800 Pure hypercholesterolemia, unspecified: Secondary | ICD-10-CM | POA: Diagnosis not present

## 2018-08-11 DIAGNOSIS — I119 Hypertensive heart disease without heart failure: Secondary | ICD-10-CM | POA: Diagnosis not present

## 2018-08-12 ENCOUNTER — Ambulatory Visit (INDEPENDENT_AMBULATORY_CARE_PROVIDER_SITE_OTHER): Payer: Medicare Other | Admitting: Internal Medicine

## 2018-08-12 ENCOUNTER — Telehealth: Payer: Self-pay

## 2018-08-12 ENCOUNTER — Encounter: Payer: Self-pay | Admitting: Internal Medicine

## 2018-08-12 VITALS — BP 100/72 | HR 80 | Ht 61.0 in | Wt 183.0 lb

## 2018-08-12 DIAGNOSIS — K5732 Diverticulitis of large intestine without perforation or abscess without bleeding: Secondary | ICD-10-CM | POA: Diagnosis not present

## 2018-08-12 DIAGNOSIS — R935 Abnormal findings on diagnostic imaging of other abdominal regions, including retroperitoneum: Secondary | ICD-10-CM

## 2018-08-12 NOTE — Patient Instructions (Signed)
Dr. Henrene Pastor will speak to Dr. Lucia Gaskins and then we will contact you if necessary

## 2018-08-12 NOTE — Telephone Encounter (Signed)
Lm for patient that Dr. Henrene Pastor spoke with Dr. Lucia Gaskins and patient does not need a colonoscopy at this time

## 2018-08-13 ENCOUNTER — Encounter: Payer: Self-pay | Admitting: Internal Medicine

## 2018-08-13 DIAGNOSIS — F329 Major depressive disorder, single episode, unspecified: Secondary | ICD-10-CM | POA: Diagnosis not present

## 2018-08-13 NOTE — Progress Notes (Signed)
HISTORY OF PRESENT ILLNESS:  Tamara [name] is a 58 y.o. female with a history of recurrent diverticulitis for which she was hospitalized in September 2019 and again in January 2020.  The most recent hospitalization revealed a large abscess which was treated with a combination of percutaneous drainage and antibiotics.  Patient has since completed her antibiotic therapy and has had her drain removed.  She has seen her general surgeon, Dr. Lucia Gaskins, who is anticipating elective resection.  Patient tells me that she is feeling much better since having her percutaneous tube removed.  No fevers or significant abdominal pain.  Moving her bowels well.  GI review of systems is otherwise negative.  She does complain of fatigue.  She continues on Dexilant for GERD.  I have reviewed her most recent CT and laboratories.  The has had colonoscopy in 2014 revealing diverticulosis and hemorrhoids.  She underwent her most recent colonoscopy January 30, 2017 to evaluate diarrhea and rectal bleeding.  She was found to have right and left-sided diverticulosis as well as internal hemorrhoids.  No other abnormalities.  Follow-up in 10 years recommended.  REVIEW OF SYSTEMS:  All non-GI ROS negative unless otherwise stated in the HPI except for fatigue  Past Medical History:  Diagnosis Date  . Allergy   . Anemia   . Anxiety   . Asthma   . Depression   . Diverticulitis   . GERD (gastroesophageal reflux disease)   . History of short term memory loss   . HTN (hypertension)   . Hyperlipemia   . Neuromuscular disorder ([place])   . PTSD (post-traumatic stress disorder)   . Shock therapy as cause of abnormal reaction of patient or of later complication without mention of misadventure at time of procedure     Past Surgical History:  Procedure Laterality Date  . ABDOMINAL HYSTERECTOMY     partial  . CHOLECYSTECTOMY    . COLONOSCOPY    . IR RADIOLOGIST EVAL & MGMT  07/24/2018  . OTHER SURGICAL HISTORY     electric shock  therapy  . TUBAL LIGATION    . UPPER GASTROINTESTINAL ENDOSCOPY      Social History Tamara [name]  reports that she has never smoked. She has never used smokeless tobacco. She reports that she does not drink alcohol or use drugs.  family history includes Arrhythmia in her father; COPD in her father; Cancer in her mother; Colon cancer in her paternal uncle; Coronary artery disease (age of onset: 58) in her maternal grandfather; Diabetes in her daughter and paternal grandfather; Hearing loss in her paternal grandfather and paternal grandmother; Heart disease in her father and maternal grandfather; Hyperlipidemia in her brother, father, maternal grandmother, and mother; Hypertension in her father, maternal grandfather, and mother; Stroke in her father.  Allergies  Allergen Reactions  . Benadryl [Diphenhydramine] Anaphylaxis  . Darvon [Propoxyphene] Other (See Comments)    Hallucinations   . Dilaudid [Hydromorphone Hcl]     HALLUCINATIONS  . Gabapentin     Unknown reaction, patient cannot remember   . Ivp Dye [Iodinated Diagnostic Agents] Anaphylaxis and Swelling  . Levaquin [Levofloxacin] Nausea And Vomiting    Patient states she tolerates Cipro  . Morphine And Related Itching  . Penicillins Rash    **Tolerated cefazolin 07/15/18**  Has patient had a PCN reaction causing immediate rash, facial/tongue/throat swelling, SOB or lightheadedness with hypotension: yes Has patient had a PCN reaction causing severe rash involving mucus membranes or skin necrosis: yes Has patient had a  PCN reaction that required hospitalization: No Has patient had a PCN reaction occurring within the last 10 years: yes If all of the above answers are "NO", then may proceed with Cephalosporin use.   Marland Kitchen Propoxyphene Hcl Other (See Comments)    hallucination  . Rocephin [Ceftriaxone] Rash    Welts; Pt said she tolerates keflex Tolerated cefazolin 07/15/18  . Seroquel [Quetiapine Fumerate]     Rectal bleeding,  abdominal cramping  . Statins Other (See Comments)    Severe mm pain (esp. Arms) >> DOES NOT WANT TO RETRY!  . Sulfonamide Derivatives Rash    Welts   . Adhesive [Tape]     This is tape as well as adhesive on bandaids.  . Dicyclomine     Vision loss in one eye  . Other     All Anti-Histamines  . Oxycodone Other (See Comments)    amnesia  . Oxycontin [Oxycodone Hcl]     Amnesia  . Robaxin [Methocarbamol]     GI upset        PHYSICAL EXAMINATION: Vital signs: BP 100/72   Pulse 80   Ht 5\' 1"  (1.549 m)   Wt 183 lb (83 kg)   BMI 34.58 kg/m   Constitutional: generally well-appearing, no acute distress Psychiatric: alert and oriented x3, cooperative Eyes: extraocular movements intact, anicteric, conjunctiva pink Mouth: oral pharynx moist, no lesions Neck: supple no lymphadenopathy Cardiovascular: heart regular rate and rhythm, no murmur Lungs: clear to auscultation bilaterally Abdomen: soft, nontender, nondistended, no obvious ascites, no peritoneal signs, normal bowel sounds, no organomegaly Rectal: Omitted Extremities: no lower extremity edema bilaterally Skin: no, cyanosis, or lesions on visible extremities Neuro: No focal deficits.  Cranial nerves intact  ASSESSMENT:  1.  Recurrent and complicated diverticulitis as described.  Status post recent hospitalization with successful percutaneous drainage of pericolonic abscess. 2.  GERD with history of peptic stricture.  Currently asymptomatic post dilation on PPI 3.  Colonoscopy 2014 and 2018 with diverticulosis and hemorrhoids   PLAN:  1.  Surgical follow-up with Dr. Lucia Gaskins as planned.  I spoke with Dr. Lucia Gaskins today to review her most recent colonoscopy.  We both agree that she does not need an additional preoperative colonoscopy. 2.  Reflux precautions 3.  Continue PPI 4.  Routine screening colonoscopy 2028 5.  Routine GI office follow-up 1 year.  Sooner if needed

## 2018-08-18 ENCOUNTER — Telehealth: Payer: Self-pay | Admitting: Internal Medicine

## 2018-08-18 MED ORDER — CIPROFLOXACIN HCL 500 MG PO TABS: 500.0000 mg | ORAL_TABLET | Freq: Two times a day (BID) | ORAL | 0 refills | Status: DC

## 2018-08-18 MED ORDER — METRONIDAZOLE 500 MG PO TABS: 500.0000 mg | ORAL_TABLET | Freq: Two times a day (BID) | ORAL | 0 refills | Status: DC

## 2018-08-18 NOTE — Telephone Encounter (Signed)
PT has diverticulitis flare up and would like to know if she could have cipro or/and flagyl.Marland Kitchen JG

## 2018-08-18 NOTE — Telephone Encounter (Signed)
Pt states that today she has started having the "pinching pain" in the center of her abdomen toward the right side. States the discomfort is bad enough that it takes her breath away. Reports no fever or chills. States the pain is what she experienced in September when she had to be in the hospital. Also feels a little bloated. Pt is concerned that she may be having a diverticulitis flare. Pt requesting antibiotics, states she does not want to wind up back in the hospital. Please advise.

## 2018-08-18 NOTE — Telephone Encounter (Signed)
Prescribe Cipro 500 mg twice daily for 10 days and metronidazole 500 mg twice daily for 10 days.  If her pain worsens she should go to the emergency room.  If it persists despite therapy, she should contact us and we may order a CT scan.  I will CC Dr. Lucia Gaskins to keep him in the loop.  Thanks

## 2018-08-18 NOTE — Telephone Encounter (Signed)
Spoke with pt and she is aware. Scripts sent to pharmacy. 

## 2018-08-19 DIAGNOSIS — F4310 Post-traumatic stress disorder, unspecified: Secondary | ICD-10-CM | POA: Diagnosis not present

## 2018-08-21 DIAGNOSIS — F4310 Post-traumatic stress disorder, unspecified: Secondary | ICD-10-CM | POA: Diagnosis not present

## 2018-08-23 DIAGNOSIS — F4310 Post-traumatic stress disorder, unspecified: Secondary | ICD-10-CM | POA: Diagnosis not present

## 2018-08-25 DIAGNOSIS — F4310 Post-traumatic stress disorder, unspecified: Secondary | ICD-10-CM | POA: Diagnosis not present

## 2018-08-26 DIAGNOSIS — F4310 Post-traumatic stress disorder, unspecified: Secondary | ICD-10-CM | POA: Diagnosis not present

## 2018-08-27 DIAGNOSIS — F4310 Post-traumatic stress disorder, unspecified: Secondary | ICD-10-CM | POA: Diagnosis not present

## 2018-09-02 DIAGNOSIS — F4310 Post-traumatic stress disorder, unspecified: Secondary | ICD-10-CM | POA: Diagnosis not present

## 2018-09-03 DIAGNOSIS — K5780 Diverticulitis of intestine, part unspecified, with perforation and abscess without bleeding: Secondary | ICD-10-CM | POA: Diagnosis not present

## 2018-09-04 DIAGNOSIS — F4310 Post-traumatic stress disorder, unspecified: Secondary | ICD-10-CM | POA: Diagnosis not present

## 2018-09-09 DIAGNOSIS — F4310 Post-traumatic stress disorder, unspecified: Secondary | ICD-10-CM | POA: Diagnosis not present

## 2018-09-11 DIAGNOSIS — F4310 Post-traumatic stress disorder, unspecified: Secondary | ICD-10-CM | POA: Diagnosis not present

## 2018-10-02 DIAGNOSIS — F4310 Post-traumatic stress disorder, unspecified: Secondary | ICD-10-CM | POA: Diagnosis not present

## 2018-10-07 DIAGNOSIS — F4310 Post-traumatic stress disorder, unspecified: Secondary | ICD-10-CM | POA: Diagnosis not present

## 2018-10-08 DIAGNOSIS — K5780 Diverticulitis of intestine, part unspecified, with perforation and abscess without bleeding: Secondary | ICD-10-CM | POA: Diagnosis not present

## 2018-10-09 DIAGNOSIS — F4310 Post-traumatic stress disorder, unspecified: Secondary | ICD-10-CM | POA: Diagnosis not present

## 2018-10-14 DIAGNOSIS — F4310 Post-traumatic stress disorder, unspecified: Secondary | ICD-10-CM | POA: Diagnosis not present

## 2018-10-15 DIAGNOSIS — M9903 Segmental and somatic dysfunction of lumbar region: Secondary | ICD-10-CM | POA: Diagnosis not present

## 2018-10-15 DIAGNOSIS — M5414 Radiculopathy, thoracic region: Secondary | ICD-10-CM | POA: Diagnosis not present

## 2018-10-15 DIAGNOSIS — M9901 Segmental and somatic dysfunction of cervical region: Secondary | ICD-10-CM | POA: Diagnosis not present

## 2018-10-15 DIAGNOSIS — M9902 Segmental and somatic dysfunction of thoracic region: Secondary | ICD-10-CM | POA: Diagnosis not present

## 2018-10-15 DIAGNOSIS — M5416 Radiculopathy, lumbar region: Secondary | ICD-10-CM | POA: Diagnosis not present

## 2018-10-15 DIAGNOSIS — M5032 Other cervical disc degeneration, mid-cervical region, unspecified level: Secondary | ICD-10-CM | POA: Diagnosis not present

## 2018-10-15 DIAGNOSIS — M62830 Muscle spasm of back: Secondary | ICD-10-CM | POA: Diagnosis not present

## 2018-10-16 DIAGNOSIS — F4310 Post-traumatic stress disorder, unspecified: Secondary | ICD-10-CM | POA: Diagnosis not present

## 2018-10-17 DIAGNOSIS — M5416 Radiculopathy, lumbar region: Secondary | ICD-10-CM | POA: Diagnosis not present

## 2018-10-17 DIAGNOSIS — M9903 Segmental and somatic dysfunction of lumbar region: Secondary | ICD-10-CM | POA: Diagnosis not present

## 2018-10-17 DIAGNOSIS — M9901 Segmental and somatic dysfunction of cervical region: Secondary | ICD-10-CM | POA: Diagnosis not present

## 2018-10-17 DIAGNOSIS — M5032 Other cervical disc degeneration, mid-cervical region, unspecified level: Secondary | ICD-10-CM | POA: Diagnosis not present

## 2018-10-17 DIAGNOSIS — M9902 Segmental and somatic dysfunction of thoracic region: Secondary | ICD-10-CM | POA: Diagnosis not present

## 2018-10-17 DIAGNOSIS — M62830 Muscle spasm of back: Secondary | ICD-10-CM | POA: Diagnosis not present

## 2018-10-17 DIAGNOSIS — M5414 Radiculopathy, thoracic region: Secondary | ICD-10-CM | POA: Diagnosis not present

## 2018-10-21 DIAGNOSIS — M9903 Segmental and somatic dysfunction of lumbar region: Secondary | ICD-10-CM | POA: Diagnosis not present

## 2018-10-21 DIAGNOSIS — F4310 Post-traumatic stress disorder, unspecified: Secondary | ICD-10-CM | POA: Diagnosis not present

## 2018-10-21 DIAGNOSIS — M62830 Muscle spasm of back: Secondary | ICD-10-CM | POA: Diagnosis not present

## 2018-10-21 DIAGNOSIS — M5416 Radiculopathy, lumbar region: Secondary | ICD-10-CM | POA: Diagnosis not present

## 2018-10-21 DIAGNOSIS — M5414 Radiculopathy, thoracic region: Secondary | ICD-10-CM | POA: Diagnosis not present

## 2018-10-21 DIAGNOSIS — M5032 Other cervical disc degeneration, mid-cervical region, unspecified level: Secondary | ICD-10-CM | POA: Diagnosis not present

## 2018-10-21 DIAGNOSIS — M9902 Segmental and somatic dysfunction of thoracic region: Secondary | ICD-10-CM | POA: Diagnosis not present

## 2018-10-21 DIAGNOSIS — M9901 Segmental and somatic dysfunction of cervical region: Secondary | ICD-10-CM | POA: Diagnosis not present

## 2018-10-23 DIAGNOSIS — M5032 Other cervical disc degeneration, mid-cervical region, unspecified level: Secondary | ICD-10-CM | POA: Diagnosis not present

## 2018-10-23 DIAGNOSIS — M5414 Radiculopathy, thoracic region: Secondary | ICD-10-CM | POA: Diagnosis not present

## 2018-10-23 DIAGNOSIS — M5416 Radiculopathy, lumbar region: Secondary | ICD-10-CM | POA: Diagnosis not present

## 2018-10-23 DIAGNOSIS — M9903 Segmental and somatic dysfunction of lumbar region: Secondary | ICD-10-CM | POA: Diagnosis not present

## 2018-10-23 DIAGNOSIS — F4310 Post-traumatic stress disorder, unspecified: Secondary | ICD-10-CM | POA: Diagnosis not present

## 2018-10-23 DIAGNOSIS — M62830 Muscle spasm of back: Secondary | ICD-10-CM | POA: Diagnosis not present

## 2018-10-23 DIAGNOSIS — M9901 Segmental and somatic dysfunction of cervical region: Secondary | ICD-10-CM | POA: Diagnosis not present

## 2018-10-23 DIAGNOSIS — M9902 Segmental and somatic dysfunction of thoracic region: Secondary | ICD-10-CM | POA: Diagnosis not present

## 2018-10-24 DIAGNOSIS — M5032 Other cervical disc degeneration, mid-cervical region, unspecified level: Secondary | ICD-10-CM | POA: Diagnosis not present

## 2018-10-24 DIAGNOSIS — M9903 Segmental and somatic dysfunction of lumbar region: Secondary | ICD-10-CM | POA: Diagnosis not present

## 2018-10-24 DIAGNOSIS — M9902 Segmental and somatic dysfunction of thoracic region: Secondary | ICD-10-CM | POA: Diagnosis not present

## 2018-10-24 DIAGNOSIS — M5416 Radiculopathy, lumbar region: Secondary | ICD-10-CM | POA: Diagnosis not present

## 2018-10-24 DIAGNOSIS — M62830 Muscle spasm of back: Secondary | ICD-10-CM | POA: Diagnosis not present

## 2018-10-24 DIAGNOSIS — M9901 Segmental and somatic dysfunction of cervical region: Secondary | ICD-10-CM | POA: Diagnosis not present

## 2018-10-24 DIAGNOSIS — M5414 Radiculopathy, thoracic region: Secondary | ICD-10-CM | POA: Diagnosis not present

## 2018-10-27 DIAGNOSIS — M9903 Segmental and somatic dysfunction of lumbar region: Secondary | ICD-10-CM | POA: Diagnosis not present

## 2018-10-27 DIAGNOSIS — M5414 Radiculopathy, thoracic region: Secondary | ICD-10-CM | POA: Diagnosis not present

## 2018-10-27 DIAGNOSIS — M9901 Segmental and somatic dysfunction of cervical region: Secondary | ICD-10-CM | POA: Diagnosis not present

## 2018-10-27 DIAGNOSIS — M62830 Muscle spasm of back: Secondary | ICD-10-CM | POA: Diagnosis not present

## 2018-10-27 DIAGNOSIS — M5032 Other cervical disc degeneration, mid-cervical region, unspecified level: Secondary | ICD-10-CM | POA: Diagnosis not present

## 2018-10-27 DIAGNOSIS — M5416 Radiculopathy, lumbar region: Secondary | ICD-10-CM | POA: Diagnosis not present

## 2018-10-27 DIAGNOSIS — M9902 Segmental and somatic dysfunction of thoracic region: Secondary | ICD-10-CM | POA: Diagnosis not present

## 2018-10-28 DIAGNOSIS — F4310 Post-traumatic stress disorder, unspecified: Secondary | ICD-10-CM | POA: Diagnosis not present

## 2018-10-29 DIAGNOSIS — M9903 Segmental and somatic dysfunction of lumbar region: Secondary | ICD-10-CM | POA: Diagnosis not present

## 2018-10-29 DIAGNOSIS — M9901 Segmental and somatic dysfunction of cervical region: Secondary | ICD-10-CM | POA: Diagnosis not present

## 2018-10-29 DIAGNOSIS — M62830 Muscle spasm of back: Secondary | ICD-10-CM | POA: Diagnosis not present

## 2018-10-29 DIAGNOSIS — M5032 Other cervical disc degeneration, mid-cervical region, unspecified level: Secondary | ICD-10-CM | POA: Diagnosis not present

## 2018-10-29 DIAGNOSIS — M5414 Radiculopathy, thoracic region: Secondary | ICD-10-CM | POA: Diagnosis not present

## 2018-10-29 DIAGNOSIS — M9902 Segmental and somatic dysfunction of thoracic region: Secondary | ICD-10-CM | POA: Diagnosis not present

## 2018-10-29 DIAGNOSIS — M5416 Radiculopathy, lumbar region: Secondary | ICD-10-CM | POA: Diagnosis not present

## 2018-10-30 DIAGNOSIS — F4310 Post-traumatic stress disorder, unspecified: Secondary | ICD-10-CM | POA: Diagnosis not present

## 2018-10-31 DIAGNOSIS — M5416 Radiculopathy, lumbar region: Secondary | ICD-10-CM | POA: Diagnosis not present

## 2018-10-31 DIAGNOSIS — M62830 Muscle spasm of back: Secondary | ICD-10-CM | POA: Diagnosis not present

## 2018-10-31 DIAGNOSIS — M9902 Segmental and somatic dysfunction of thoracic region: Secondary | ICD-10-CM | POA: Diagnosis not present

## 2018-10-31 DIAGNOSIS — M9901 Segmental and somatic dysfunction of cervical region: Secondary | ICD-10-CM | POA: Diagnosis not present

## 2018-10-31 DIAGNOSIS — M5414 Radiculopathy, thoracic region: Secondary | ICD-10-CM | POA: Diagnosis not present

## 2018-10-31 DIAGNOSIS — M5032 Other cervical disc degeneration, mid-cervical region, unspecified level: Secondary | ICD-10-CM | POA: Diagnosis not present

## 2018-10-31 DIAGNOSIS — M9903 Segmental and somatic dysfunction of lumbar region: Secondary | ICD-10-CM | POA: Diagnosis not present

## 2018-11-04 DIAGNOSIS — M5032 Other cervical disc degeneration, mid-cervical region, unspecified level: Secondary | ICD-10-CM | POA: Diagnosis not present

## 2018-11-04 DIAGNOSIS — M9902 Segmental and somatic dysfunction of thoracic region: Secondary | ICD-10-CM | POA: Diagnosis not present

## 2018-11-04 DIAGNOSIS — M5416 Radiculopathy, lumbar region: Secondary | ICD-10-CM | POA: Diagnosis not present

## 2018-11-04 DIAGNOSIS — M9903 Segmental and somatic dysfunction of lumbar region: Secondary | ICD-10-CM | POA: Diagnosis not present

## 2018-11-04 DIAGNOSIS — M9901 Segmental and somatic dysfunction of cervical region: Secondary | ICD-10-CM | POA: Diagnosis not present

## 2018-11-04 DIAGNOSIS — M62830 Muscle spasm of back: Secondary | ICD-10-CM | POA: Diagnosis not present

## 2018-11-04 DIAGNOSIS — M5414 Radiculopathy, thoracic region: Secondary | ICD-10-CM | POA: Diagnosis not present

## 2018-11-06 DIAGNOSIS — M5414 Radiculopathy, thoracic region: Secondary | ICD-10-CM | POA: Diagnosis not present

## 2018-11-06 DIAGNOSIS — M5032 Other cervical disc degeneration, mid-cervical region, unspecified level: Secondary | ICD-10-CM | POA: Diagnosis not present

## 2018-11-06 DIAGNOSIS — M9901 Segmental and somatic dysfunction of cervical region: Secondary | ICD-10-CM | POA: Diagnosis not present

## 2018-11-06 DIAGNOSIS — M62830 Muscle spasm of back: Secondary | ICD-10-CM | POA: Diagnosis not present

## 2018-11-06 DIAGNOSIS — M9902 Segmental and somatic dysfunction of thoracic region: Secondary | ICD-10-CM | POA: Diagnosis not present

## 2018-11-06 DIAGNOSIS — M5416 Radiculopathy, lumbar region: Secondary | ICD-10-CM | POA: Diagnosis not present

## 2018-11-06 DIAGNOSIS — M9903 Segmental and somatic dysfunction of lumbar region: Secondary | ICD-10-CM | POA: Diagnosis not present

## 2018-11-11 DIAGNOSIS — M9901 Segmental and somatic dysfunction of cervical region: Secondary | ICD-10-CM | POA: Diagnosis not present

## 2018-11-11 DIAGNOSIS — M5414 Radiculopathy, thoracic region: Secondary | ICD-10-CM | POA: Diagnosis not present

## 2018-11-11 DIAGNOSIS — M5416 Radiculopathy, lumbar region: Secondary | ICD-10-CM | POA: Diagnosis not present

## 2018-11-11 DIAGNOSIS — M9903 Segmental and somatic dysfunction of lumbar region: Secondary | ICD-10-CM | POA: Diagnosis not present

## 2018-11-11 DIAGNOSIS — M9902 Segmental and somatic dysfunction of thoracic region: Secondary | ICD-10-CM | POA: Diagnosis not present

## 2018-11-11 DIAGNOSIS — M62830 Muscle spasm of back: Secondary | ICD-10-CM | POA: Diagnosis not present

## 2018-11-11 DIAGNOSIS — M5032 Other cervical disc degeneration, mid-cervical region, unspecified level: Secondary | ICD-10-CM | POA: Diagnosis not present

## 2018-11-12 DIAGNOSIS — F329 Major depressive disorder, single episode, unspecified: Secondary | ICD-10-CM | POA: Diagnosis not present

## 2018-11-13 DIAGNOSIS — M9903 Segmental and somatic dysfunction of lumbar region: Secondary | ICD-10-CM | POA: Diagnosis not present

## 2018-11-13 DIAGNOSIS — M62830 Muscle spasm of back: Secondary | ICD-10-CM | POA: Diagnosis not present

## 2018-11-13 DIAGNOSIS — M5414 Radiculopathy, thoracic region: Secondary | ICD-10-CM | POA: Diagnosis not present

## 2018-11-13 DIAGNOSIS — M5416 Radiculopathy, lumbar region: Secondary | ICD-10-CM | POA: Diagnosis not present

## 2018-11-13 DIAGNOSIS — M9901 Segmental and somatic dysfunction of cervical region: Secondary | ICD-10-CM | POA: Diagnosis not present

## 2018-11-13 DIAGNOSIS — M5032 Other cervical disc degeneration, mid-cervical region, unspecified level: Secondary | ICD-10-CM | POA: Diagnosis not present

## 2018-11-13 DIAGNOSIS — M9902 Segmental and somatic dysfunction of thoracic region: Secondary | ICD-10-CM | POA: Diagnosis not present

## 2018-11-18 DIAGNOSIS — M5416 Radiculopathy, lumbar region: Secondary | ICD-10-CM | POA: Diagnosis not present

## 2018-11-18 DIAGNOSIS — M62830 Muscle spasm of back: Secondary | ICD-10-CM | POA: Diagnosis not present

## 2018-11-18 DIAGNOSIS — M5032 Other cervical disc degeneration, mid-cervical region, unspecified level: Secondary | ICD-10-CM | POA: Diagnosis not present

## 2018-11-18 DIAGNOSIS — M5414 Radiculopathy, thoracic region: Secondary | ICD-10-CM | POA: Diagnosis not present

## 2018-11-18 DIAGNOSIS — M9902 Segmental and somatic dysfunction of thoracic region: Secondary | ICD-10-CM | POA: Diagnosis not present

## 2018-11-18 DIAGNOSIS — M9903 Segmental and somatic dysfunction of lumbar region: Secondary | ICD-10-CM | POA: Diagnosis not present

## 2018-11-18 DIAGNOSIS — M9901 Segmental and somatic dysfunction of cervical region: Secondary | ICD-10-CM | POA: Diagnosis not present

## 2018-11-20 DIAGNOSIS — M9902 Segmental and somatic dysfunction of thoracic region: Secondary | ICD-10-CM | POA: Diagnosis not present

## 2018-11-20 DIAGNOSIS — M9901 Segmental and somatic dysfunction of cervical region: Secondary | ICD-10-CM | POA: Diagnosis not present

## 2018-11-20 DIAGNOSIS — M5414 Radiculopathy, thoracic region: Secondary | ICD-10-CM | POA: Diagnosis not present

## 2018-11-20 DIAGNOSIS — M62830 Muscle spasm of back: Secondary | ICD-10-CM | POA: Diagnosis not present

## 2018-11-20 DIAGNOSIS — M5416 Radiculopathy, lumbar region: Secondary | ICD-10-CM | POA: Diagnosis not present

## 2018-11-20 DIAGNOSIS — M5032 Other cervical disc degeneration, mid-cervical region, unspecified level: Secondary | ICD-10-CM | POA: Diagnosis not present

## 2018-11-20 DIAGNOSIS — M9903 Segmental and somatic dysfunction of lumbar region: Secondary | ICD-10-CM | POA: Diagnosis not present

## 2018-11-25 DIAGNOSIS — M62830 Muscle spasm of back: Secondary | ICD-10-CM | POA: Diagnosis not present

## 2018-11-25 DIAGNOSIS — M9903 Segmental and somatic dysfunction of lumbar region: Secondary | ICD-10-CM | POA: Diagnosis not present

## 2018-11-25 DIAGNOSIS — M5414 Radiculopathy, thoracic region: Secondary | ICD-10-CM | POA: Diagnosis not present

## 2018-11-25 DIAGNOSIS — M9902 Segmental and somatic dysfunction of thoracic region: Secondary | ICD-10-CM | POA: Diagnosis not present

## 2018-11-25 DIAGNOSIS — M5032 Other cervical disc degeneration, mid-cervical region, unspecified level: Secondary | ICD-10-CM | POA: Diagnosis not present

## 2018-11-25 DIAGNOSIS — M5416 Radiculopathy, lumbar region: Secondary | ICD-10-CM | POA: Diagnosis not present

## 2018-11-25 DIAGNOSIS — M9901 Segmental and somatic dysfunction of cervical region: Secondary | ICD-10-CM | POA: Diagnosis not present

## 2018-11-27 DIAGNOSIS — M9903 Segmental and somatic dysfunction of lumbar region: Secondary | ICD-10-CM | POA: Diagnosis not present

## 2018-11-27 DIAGNOSIS — M5032 Other cervical disc degeneration, mid-cervical region, unspecified level: Secondary | ICD-10-CM | POA: Diagnosis not present

## 2018-11-27 DIAGNOSIS — M5414 Radiculopathy, thoracic region: Secondary | ICD-10-CM | POA: Diagnosis not present

## 2018-11-27 DIAGNOSIS — F4310 Post-traumatic stress disorder, unspecified: Secondary | ICD-10-CM | POA: Diagnosis not present

## 2018-11-27 DIAGNOSIS — M62830 Muscle spasm of back: Secondary | ICD-10-CM | POA: Diagnosis not present

## 2018-11-27 DIAGNOSIS — M9901 Segmental and somatic dysfunction of cervical region: Secondary | ICD-10-CM | POA: Diagnosis not present

## 2018-11-27 DIAGNOSIS — M5416 Radiculopathy, lumbar region: Secondary | ICD-10-CM | POA: Diagnosis not present

## 2018-11-27 DIAGNOSIS — M9902 Segmental and somatic dysfunction of thoracic region: Secondary | ICD-10-CM | POA: Diagnosis not present

## 2018-11-28 ENCOUNTER — Other Ambulatory Visit: Payer: Self-pay | Admitting: Obstetrics & Gynecology

## 2018-11-28 DIAGNOSIS — Z1231 Encounter for screening mammogram for malignant neoplasm of breast: Secondary | ICD-10-CM

## 2018-12-02 DIAGNOSIS — F4310 Post-traumatic stress disorder, unspecified: Secondary | ICD-10-CM | POA: Diagnosis not present

## 2018-12-03 DIAGNOSIS — M5414 Radiculopathy, thoracic region: Secondary | ICD-10-CM | POA: Diagnosis not present

## 2018-12-03 DIAGNOSIS — M9901 Segmental and somatic dysfunction of cervical region: Secondary | ICD-10-CM | POA: Diagnosis not present

## 2018-12-03 DIAGNOSIS — M5032 Other cervical disc degeneration, mid-cervical region, unspecified level: Secondary | ICD-10-CM | POA: Diagnosis not present

## 2018-12-03 DIAGNOSIS — M9902 Segmental and somatic dysfunction of thoracic region: Secondary | ICD-10-CM | POA: Diagnosis not present

## 2018-12-03 DIAGNOSIS — M9903 Segmental and somatic dysfunction of lumbar region: Secondary | ICD-10-CM | POA: Diagnosis not present

## 2018-12-03 DIAGNOSIS — M5416 Radiculopathy, lumbar region: Secondary | ICD-10-CM | POA: Diagnosis not present

## 2018-12-03 DIAGNOSIS — M62830 Muscle spasm of back: Secondary | ICD-10-CM | POA: Diagnosis not present

## 2018-12-08 DIAGNOSIS — E7800 Pure hypercholesterolemia, unspecified: Secondary | ICD-10-CM | POA: Diagnosis not present

## 2018-12-08 DIAGNOSIS — I119 Hypertensive heart disease without heart failure: Secondary | ICD-10-CM | POA: Diagnosis not present

## 2018-12-08 DIAGNOSIS — N183 Chronic kidney disease, stage 3 (moderate): Secondary | ICD-10-CM | POA: Diagnosis not present

## 2018-12-09 DIAGNOSIS — E559 Vitamin D deficiency, unspecified: Secondary | ICD-10-CM | POA: Diagnosis not present

## 2018-12-09 DIAGNOSIS — R7989 Other specified abnormal findings of blood chemistry: Secondary | ICD-10-CM | POA: Diagnosis not present

## 2018-12-09 DIAGNOSIS — Z03818 Encounter for observation for suspected exposure to other biological agents ruled out: Secondary | ICD-10-CM | POA: Diagnosis not present

## 2018-12-09 DIAGNOSIS — R5383 Other fatigue: Secondary | ICD-10-CM | POA: Diagnosis not present

## 2018-12-09 DIAGNOSIS — E782 Mixed hyperlipidemia: Secondary | ICD-10-CM | POA: Diagnosis not present

## 2018-12-09 DIAGNOSIS — E7210 Disorders of sulfur-bearing amino-acid metabolism, unspecified: Secondary | ICD-10-CM | POA: Diagnosis not present

## 2018-12-11 DIAGNOSIS — F4310 Post-traumatic stress disorder, unspecified: Secondary | ICD-10-CM | POA: Diagnosis not present

## 2018-12-17 DIAGNOSIS — K5780 Diverticulitis of intestine, part unspecified, with perforation and abscess without bleeding: Secondary | ICD-10-CM | POA: Diagnosis not present

## 2018-12-18 DIAGNOSIS — F4310 Post-traumatic stress disorder, unspecified: Secondary | ICD-10-CM | POA: Diagnosis not present

## 2018-12-18 DIAGNOSIS — K5792 Diverticulitis of intestine, part unspecified, without perforation or abscess without bleeding: Secondary | ICD-10-CM | POA: Diagnosis not present

## 2018-12-23 DIAGNOSIS — F4310 Post-traumatic stress disorder, unspecified: Secondary | ICD-10-CM | POA: Diagnosis not present

## 2018-12-25 DIAGNOSIS — F4310 Post-traumatic stress disorder, unspecified: Secondary | ICD-10-CM | POA: Diagnosis not present

## 2018-12-30 DIAGNOSIS — F4310 Post-traumatic stress disorder, unspecified: Secondary | ICD-10-CM | POA: Diagnosis not present

## 2018-12-31 DIAGNOSIS — Z779 Other contact with and (suspected) exposures hazardous to health: Secondary | ICD-10-CM | POA: Diagnosis not present

## 2019-01-06 DIAGNOSIS — M545 Low back pain: Secondary | ICD-10-CM | POA: Diagnosis not present

## 2019-01-07 DIAGNOSIS — M545 Low back pain: Secondary | ICD-10-CM | POA: Diagnosis not present

## 2019-01-09 DIAGNOSIS — M542 Cervicalgia: Secondary | ICD-10-CM | POA: Diagnosis not present

## 2019-01-09 DIAGNOSIS — M545 Low back pain: Secondary | ICD-10-CM | POA: Diagnosis not present

## 2019-01-13 DIAGNOSIS — F4310 Post-traumatic stress disorder, unspecified: Secondary | ICD-10-CM | POA: Diagnosis not present

## 2019-01-14 DIAGNOSIS — M545 Low back pain: Secondary | ICD-10-CM | POA: Diagnosis not present

## 2019-01-14 DIAGNOSIS — M542 Cervicalgia: Secondary | ICD-10-CM | POA: Diagnosis not present

## 2019-01-15 DIAGNOSIS — F4310 Post-traumatic stress disorder, unspecified: Secondary | ICD-10-CM | POA: Diagnosis not present

## 2019-01-16 ENCOUNTER — Ambulatory Visit: Payer: Medicare Other

## 2019-01-16 DIAGNOSIS — M545 Low back pain: Secondary | ICD-10-CM | POA: Diagnosis not present

## 2019-01-16 DIAGNOSIS — M542 Cervicalgia: Secondary | ICD-10-CM | POA: Diagnosis not present

## 2019-01-20 DIAGNOSIS — F4310 Post-traumatic stress disorder, unspecified: Secondary | ICD-10-CM | POA: Diagnosis not present

## 2019-01-21 DIAGNOSIS — M542 Cervicalgia: Secondary | ICD-10-CM | POA: Diagnosis not present

## 2019-01-21 DIAGNOSIS — M545 Low back pain: Secondary | ICD-10-CM | POA: Diagnosis not present

## 2019-01-22 DIAGNOSIS — F4310 Post-traumatic stress disorder, unspecified: Secondary | ICD-10-CM | POA: Diagnosis not present

## 2019-01-27 DIAGNOSIS — F4310 Post-traumatic stress disorder, unspecified: Secondary | ICD-10-CM | POA: Diagnosis not present

## 2019-01-28 DIAGNOSIS — M545 Low back pain: Secondary | ICD-10-CM | POA: Diagnosis not present

## 2019-01-28 DIAGNOSIS — M542 Cervicalgia: Secondary | ICD-10-CM | POA: Diagnosis not present

## 2019-01-29 DIAGNOSIS — F4310 Post-traumatic stress disorder, unspecified: Secondary | ICD-10-CM | POA: Diagnosis not present

## 2019-01-30 ENCOUNTER — Telehealth: Payer: Self-pay | Admitting: Internal Medicine

## 2019-01-30 DIAGNOSIS — M545 Low back pain: Secondary | ICD-10-CM | POA: Diagnosis not present

## 2019-01-30 DIAGNOSIS — M542 Cervicalgia: Secondary | ICD-10-CM | POA: Diagnosis not present

## 2019-01-30 DIAGNOSIS — F329 Major depressive disorder, single episode, unspecified: Secondary | ICD-10-CM | POA: Diagnosis not present

## 2019-01-30 MED ORDER — METRONIDAZOLE 500 MG PO TABS: 500.0000 mg | ORAL_TABLET | Freq: Two times a day (BID) | ORAL | 0 refills | Status: DC

## 2019-01-30 MED ORDER — CIPROFLOXACIN HCL 500 MG PO TABS: 500.0000 mg | ORAL_TABLET | Freq: Two times a day (BID) | ORAL | 0 refills | Status: DC

## 2019-01-30 NOTE — Telephone Encounter (Signed)
Patient called said that she is feeling like she is having a diverticulitis flare and would like to know if she can get Cipro and flagyl called in.

## 2019-01-30 NOTE — Telephone Encounter (Signed)
Scripts sent to pharmacy. Pt scheduled to see Tye Savoy NP 02/24/19@10am . Pt aware of appt.

## 2019-01-30 NOTE — Telephone Encounter (Signed)
Cipro and Flagyl 500 mg twice daily for each for 10 days.  Low residue diet.  Have her follow-up with 1 of the advanced practitioners in the next 2 weeks.  Overbook if you need to [place]

## 2019-01-30 NOTE — Telephone Encounter (Signed)
Spoke with pt and she states she is having the pain in her LLQ and she is afraid it is the beginning of diverticulitis flare. Pt states it started last evening and she has no fever. She is requesting Cipro and Flagyl. Please advise.

## 2019-02-03 DIAGNOSIS — F4310 Post-traumatic stress disorder, unspecified: Secondary | ICD-10-CM | POA: Diagnosis not present

## 2019-02-12 DIAGNOSIS — F4310 Post-traumatic stress disorder, unspecified: Secondary | ICD-10-CM | POA: Diagnosis not present

## 2019-02-13 DIAGNOSIS — M542 Cervicalgia: Secondary | ICD-10-CM | POA: Diagnosis not present

## 2019-02-13 DIAGNOSIS — M545 Low back pain: Secondary | ICD-10-CM | POA: Diagnosis not present

## 2019-02-17 DIAGNOSIS — M545 Low back pain: Secondary | ICD-10-CM | POA: Diagnosis not present

## 2019-02-17 DIAGNOSIS — M542 Cervicalgia: Secondary | ICD-10-CM | POA: Diagnosis not present

## 2019-02-17 DIAGNOSIS — F4310 Post-traumatic stress disorder, unspecified: Secondary | ICD-10-CM | POA: Diagnosis not present

## 2019-02-19 DIAGNOSIS — F4310 Post-traumatic stress disorder, unspecified: Secondary | ICD-10-CM | POA: Diagnosis not present

## 2019-02-24 ENCOUNTER — Telehealth: Payer: Self-pay | Admitting: Nurse Practitioner

## 2019-02-24 ENCOUNTER — Ambulatory Visit: Payer: Medicare Other | Admitting: Nurse Practitioner

## 2019-02-24 NOTE — Telephone Encounter (Signed)
Spoke with the patient. Afebrile. No body aches, no difficulty breathing, and no decrease of taste. She does have a sore throat and nasal discharge. She feels she is suffering with "allergies." She does not have any known exposure to [place]. She has not been around anyone who is waiting for test results. No recent travel. Patient requests to be rescheduled "just in case." Appointment moved to next week

## 2019-02-24 NOTE — Telephone Encounter (Signed)
Pt is scheduled for 10 AM appt and reported that she had woken up with a sore throat.  Please advise.

## 2019-02-25 DIAGNOSIS — M542 Cervicalgia: Secondary | ICD-10-CM | POA: Diagnosis not present

## 2019-02-25 DIAGNOSIS — M545 Low back pain: Secondary | ICD-10-CM | POA: Diagnosis not present

## 2019-02-26 DIAGNOSIS — F4310 Post-traumatic stress disorder, unspecified: Secondary | ICD-10-CM | POA: Diagnosis not present

## 2019-03-03 DIAGNOSIS — F4310 Post-traumatic stress disorder, unspecified: Secondary | ICD-10-CM | POA: Diagnosis not present

## 2019-03-04 ENCOUNTER — Other Ambulatory Visit: Payer: Self-pay

## 2019-03-04 ENCOUNTER — Ambulatory Visit
Admission: RE | Admit: 2019-03-04 | Discharge: 2019-03-04 | Disposition: A | Discharge status: Home / Self Care | Payer: Medicare Other | Source: Ambulatory Visit | Attending: Obstetrics & Gynecology | Admitting: Obstetrics & Gynecology

## 2019-03-04 DIAGNOSIS — Z1231 Encounter for screening mammogram for malignant neoplasm of breast: Secondary | ICD-10-CM

## 2019-03-05 ENCOUNTER — Other Ambulatory Visit: Payer: Self-pay

## 2019-03-05 ENCOUNTER — Encounter: Payer: Self-pay | Admitting: Nurse Practitioner

## 2019-03-05 ENCOUNTER — Ambulatory Visit (INDEPENDENT_AMBULATORY_CARE_PROVIDER_SITE_OTHER): Payer: Medicare Other | Admitting: Nurse Practitioner

## 2019-03-05 VITALS — BP 102/70 | HR 92 | Temp 97.2°F | Ht 62.0 in | Wt 188.0 lb

## 2019-03-05 DIAGNOSIS — M5414 Radiculopathy, thoracic region: Secondary | ICD-10-CM | POA: Diagnosis not present

## 2019-03-05 DIAGNOSIS — M5032 Other cervical disc degeneration, mid-cervical region, unspecified level: Secondary | ICD-10-CM | POA: Diagnosis not present

## 2019-03-05 DIAGNOSIS — M9901 Segmental and somatic dysfunction of cervical region: Secondary | ICD-10-CM | POA: Diagnosis not present

## 2019-03-05 DIAGNOSIS — K5732 Diverticulitis of large intestine without perforation or abscess without bleeding: Secondary | ICD-10-CM | POA: Diagnosis not present

## 2019-03-05 DIAGNOSIS — M9903 Segmental and somatic dysfunction of lumbar region: Secondary | ICD-10-CM | POA: Diagnosis not present

## 2019-03-05 DIAGNOSIS — F4310 Post-traumatic stress disorder, unspecified: Secondary | ICD-10-CM | POA: Diagnosis not present

## 2019-03-05 DIAGNOSIS — M5416 Radiculopathy, lumbar region: Secondary | ICD-10-CM | POA: Diagnosis not present

## 2019-03-05 DIAGNOSIS — M9902 Segmental and somatic dysfunction of thoracic region: Secondary | ICD-10-CM | POA: Diagnosis not present

## 2019-03-05 MED ORDER — CIPROFLOXACIN HCL 500 MG PO TABS: 500.0000 mg | ORAL_TABLET | Freq: Two times a day (BID) | ORAL | 0 refills | Status: DC

## 2019-03-05 MED ORDER — METRONIDAZOLE 500 MG PO TABS: 500.0000 mg | ORAL_TABLET | Freq: Three times a day (TID) | ORAL | 0 refills | Status: DC

## 2019-03-05 NOTE — Progress Notes (Signed)
Chief Complaint: Diverticulitis     IMPRESSION and PLAN:    58 year old female with history of recurrent diverticulitis including an episode in January which was complicated by large abscess requiring percutaneous drainage.  We treated her with another round of antibiotics late July when she called with flare type symptoms. LLQ pain resolved with antibiotics but as of this week the LLQ pain has started to return.  Right now the pain is mild but constant and slowly getting worse.  No fevers. --Will prescribe another round of Cipro and Flagyl x10 days.  --Patient is going to call Dr Lucia Gaskins at [place] and tell him she has decided to proceed with a resection as they previously discussed.  -In the interim, will order CT AP if she gets worse  HPI:     Patient is a 58 year old female known to Dr. Henrene Pastor for history of GERD and also.  recurrent episodes of diverticulitis.  In January of this year she was hospitalized with diverticulitis complicated by a large abscess treated with percutaneous drainage and antibiotics.  She follows up with Dr. Lucia Gaskins at [place] in December to talk further about sigmoid resection.  Late July patient called the office with recurrent LLQ reminiscent of diverticulitis.  We called in Cipro / Flagyl for 10 days.  She got better but pain has started to come back this week.  She is again having nearly constant LLQ discomfort.  It is mild but slowly getting worse No fevers.  No nausea or vomiting.  No bowel changes.  No dysuria.  Emmalynn says she has done everything that has be recommended to try and prevent/manage these episodes.  She doesn't want to live the rest of her life going through this and has therefore decided to proceed with colon resection as discussed with Dr.  Lucia Gaskins.  She is due to see him in December but will call for sooner appointment  Review of systems:     No chest pain, no SOB, no fevers, no urinary sx   Past Medical History:  Diagnosis Date  . Allergy   .  Anemia   . Anxiety   . Asthma   . Depression   . Diverticulitis   . GERD (gastroesophageal reflux disease)   . History of short term memory loss   . HTN (hypertension)   . Hyperlipemia   . Neuromuscular disorder ([place])   . PTSD (post-traumatic stress disorder)   . Shock therapy as cause of abnormal reaction of patient or of later complication without mention of misadventure at time of procedure     Patient's surgical history, family medical history, social history, medications and allergies were all reviewed in Epic    Current Outpatient Medications  Medication Sig Dispense Refill  . albuterol (PROVENTIL HFA;VENTOLIN HFA) 108 (90 Base) MCG/ACT inhaler Inhale 2 puffs into the lungs every 6 (six) hours as needed for wheezing or shortness of breath. 1 Inhaler 3  . ALPRAZolam (XANAX XR) 1 MG 24 hr tablet Take 2 mg by mouth at bedtime.     . ALPRAZolam (XANAX) 1 MG tablet Take 0.5-1 mg by mouth 2 (two) times daily as needed for anxiety.    Marland Kitchen aspirin 325 MG tablet Take 1 tablet (325 mg total) by mouth daily. 30 tablet 0  . Cholecalciferol (VITAMIN D3) 50000 UNITS CAPS Take 50,000 Units by mouth once a week. (Patient taking differently: Take 50,000 Units by mouth every Saturday. ) 12 capsule 2  . dexlansoprazole (DEXILANT) 60  MG capsule Take 1 capsule (60 mg total) by mouth daily. 90 capsule 3  . doxycycline (VIBRAMYCIN) 100 MG capsule Take 100 mg by mouth daily.     . fenofibrate (TRICOR) 145 MG tablet Take 1 tablet (145 mg total) by mouth daily. 90 tablet 0  . losartan (COZAAR) 100 MG tablet Take 1 tablet (100 mg total) by mouth daily. 90 tablet 3  . LYSINE PO Take 1 tablet by mouth as needed. skin    . Oxcarbazepine (TRILEPTAL) 300 MG tablet Take 300 mg by mouth daily.    . temazepam (RESTORIL) 15 MG capsule Take 15 mg by mouth at bedtime.     . temazepam (RESTORIL) 30 MG capsule Take 30 mg by mouth at bedtime.    . Vilazodone HCl (VIIBRYD) 10 MG TABS Take 10 mg by mouth daily.    .  Vilazodone HCl (VIIBRYD) 20 MG TABS Take 20 mg by mouth daily.     No current facility-administered medications for this visit.     Physical Exam:     BP 102/70   Pulse 92   Temp (!) 97.2 F (36.2 C)   Ht 5\' 2"  (1.575 m)   Wt 188 lb (85.3 kg)   BMI 34.39 kg/m   GENERAL:  Pleasant female in NAD PSYCH: : Cooperative, normal affect EENT:  conjunctiva pink, mucous membranes moist, neck supple without masses CARDIAC:  RRR, no murmur heard, no peripheral edema PULM: Normal respiratory effort, lungs CTA bilaterally, no wheezing ABDOMEN:  Nondistended, soft, mild LLQ tenderness.  No obvious masses, no hepatomegaly,  normal bowel sounds SKIN:  turgor, no lesions seen Musculoskeletal:  Normal muscle tone, normal strength NEURO: Alert and oriented x 3, no focal neurologic deficits   Tye Savoy , NP 03/05/2019, 2:24 PM

## 2019-03-05 NOTE — Progress Notes (Signed)
Agree with assessment and plan 

## 2019-03-05 NOTE — Patient Instructions (Signed)
If you are age 57 or older, your body mass index should be between 23-30. Your Body mass index is 34.39 kg/m. If this is out of the aforementioned range listed, please consider follow up with your Primary Care Provider.  If you are age 48 or younger, your body mass index should be between 19-25. Your Body mass index is 34.39 kg/m. If this is out of the aformentioned range listed, please consider follow up with your Primary Care Provider.   We have sent the following medications to your pharmacy for you to pick up at your convenience: Flagyl Cipro  Call if fever or worsening pain and will get CT scan.  Thank you for choosing me and [facility].   Tye Savoy, NP

## 2019-03-10 ENCOUNTER — Other Ambulatory Visit: Payer: Self-pay

## 2019-03-10 ENCOUNTER — Telehealth: Payer: Self-pay | Admitting: Nurse Practitioner

## 2019-03-10 DIAGNOSIS — M9901 Segmental and somatic dysfunction of cervical region: Secondary | ICD-10-CM | POA: Diagnosis not present

## 2019-03-10 DIAGNOSIS — F4310 Post-traumatic stress disorder, unspecified: Secondary | ICD-10-CM | POA: Diagnosis not present

## 2019-03-10 DIAGNOSIS — M5416 Radiculopathy, lumbar region: Secondary | ICD-10-CM | POA: Diagnosis not present

## 2019-03-10 DIAGNOSIS — M5414 Radiculopathy, thoracic region: Secondary | ICD-10-CM | POA: Diagnosis not present

## 2019-03-10 DIAGNOSIS — M5032 Other cervical disc degeneration, mid-cervical region, unspecified level: Secondary | ICD-10-CM | POA: Diagnosis not present

## 2019-03-10 DIAGNOSIS — R1032 Left lower quadrant pain: Secondary | ICD-10-CM

## 2019-03-10 DIAGNOSIS — M9903 Segmental and somatic dysfunction of lumbar region: Secondary | ICD-10-CM | POA: Diagnosis not present

## 2019-03-10 DIAGNOSIS — M9902 Segmental and somatic dysfunction of thoracic region: Secondary | ICD-10-CM | POA: Diagnosis not present

## 2019-03-10 DIAGNOSIS — K5732 Diverticulitis of large intestine without perforation or abscess without bleeding: Secondary | ICD-10-CM

## 2019-03-10 NOTE — Telephone Encounter (Signed)
Okay, please have her come for CBC, BMET and let's get CT AP with contrast for diverticulitis. Thanks

## 2019-03-10 NOTE — Telephone Encounter (Signed)
Bowel movements are dark and loose, unformed. Abdominal pain is in the LLQ and it "comes and goes and it is sharp." She has chills, but has not spiked any fever. She feels exhausted. Her diet is soft, no fiber and she is focused on hydration. She will see Dr Lucia Gaskins on Friday. Please advise.

## 2019-03-10 NOTE — Telephone Encounter (Signed)
IVP allergy. Confirmed with the patient she cannot have the IV dye. She states she can drink oral contrast. CT abd/pelvis ordered without contrast. She will come for her labs in the morning.

## 2019-03-11 ENCOUNTER — Other Ambulatory Visit: Payer: Self-pay

## 2019-03-11 ENCOUNTER — Other Ambulatory Visit (INDEPENDENT_AMBULATORY_CARE_PROVIDER_SITE_OTHER): Payer: Medicare Other

## 2019-03-11 DIAGNOSIS — R1032 Left lower quadrant pain: Secondary | ICD-10-CM | POA: Diagnosis not present

## 2019-03-11 DIAGNOSIS — K5732 Diverticulitis of large intestine without perforation or abscess without bleeding: Secondary | ICD-10-CM

## 2019-03-11 LAB — COMPREHENSIVE METABOLIC PANEL
ALT: 17 U/L (ref 0–35)
AST: 21 U/L (ref 0–37)
Albumin: 4.2 g/dL (ref 3.5–5.2)
Alkaline Phosphatase: 80 U/L (ref 39–117)
BUN: 12 mg/dL (ref 6–23)
CO2: 26 mEq/L (ref 19–32)
Calcium: 10 mg/dL (ref 8.4–10.5)
Chloride: 101 mEq/L (ref 96–112)
Creatinine, Ser: 1.1 mg/dL (ref 0.40–1.20)
GFR: 51.2 mL/min — ABNORMAL LOW (ref >60.00)
Glucose, Bld: 85 mg/dL (ref 70–99)
Potassium: 4 mEq/L (ref 3.5–5.1)
Sodium: 135 mEq/L (ref 135–145)
Total Bilirubin: 0.8 mg/dL (ref 0.2–1.2)
Total Protein: 7.4 g/dL (ref 6.0–8.3)

## 2019-03-11 LAB — CBC WITH DIFFERENTIAL/PLATELET
Basophils Absolute: 0.1 10*3/uL (ref 0.0–0.1)
Basophils Relative: 1.3 % (ref 0.0–3.0)
Eosinophils Absolute: 0.2 10*3/uL (ref 0.0–0.7)
Eosinophils Relative: 3.4 % (ref 0.0–5.0)
HCT: 42.4 % (ref 36.0–46.0)
Hemoglobin: 14.3 g/dL (ref 12.0–15.0)
Lymphocytes Relative: 39.3 % (ref 12.0–46.0)
Lymphs Abs: 2.7 10*3/uL (ref 0.7–4.0)
MCHC: 33.8 g/dL (ref 30.0–36.0)
MCV: 85.2 fl (ref 78.0–100.0)
Monocytes Absolute: 0.5 10*3/uL (ref 0.1–1.0)
Monocytes Relative: 7.7 % (ref 3.0–12.0)
Neutro Abs: 3.3 10*3/uL (ref 1.4–7.7)
Neutrophils Relative %: 48.3 % (ref 43.0–77.0)
Platelets: 308 10*3/uL (ref 150.0–400.0)
RBC: 4.98 Mil/uL (ref 3.87–5.11)
RDW: 13.6 % (ref 11.5–15.5)
WBC: 6.8 10*3/uL (ref 4.0–10.5)

## 2019-03-11 NOTE — Telephone Encounter (Signed)
Tamara [name] She is scheduled for her CT abd/pelvis Friday 03/13/19. It will be without contrast due to her allergy. Please close this note. Thank you.

## 2019-03-12 DIAGNOSIS — M9903 Segmental and somatic dysfunction of lumbar region: Secondary | ICD-10-CM | POA: Diagnosis not present

## 2019-03-12 DIAGNOSIS — M9901 Segmental and somatic dysfunction of cervical region: Secondary | ICD-10-CM | POA: Diagnosis not present

## 2019-03-12 DIAGNOSIS — M5414 Radiculopathy, thoracic region: Secondary | ICD-10-CM | POA: Diagnosis not present

## 2019-03-12 DIAGNOSIS — M5416 Radiculopathy, lumbar region: Secondary | ICD-10-CM | POA: Diagnosis not present

## 2019-03-12 DIAGNOSIS — M5032 Other cervical disc degeneration, mid-cervical region, unspecified level: Secondary | ICD-10-CM | POA: Diagnosis not present

## 2019-03-12 DIAGNOSIS — M9902 Segmental and somatic dysfunction of thoracic region: Secondary | ICD-10-CM | POA: Diagnosis not present

## 2019-03-12 NOTE — Telephone Encounter (Signed)
Okay, all of the other CT scans have been without IV contrast so should be fine

## 2019-03-13 ENCOUNTER — Other Ambulatory Visit (HOSPITAL_COMMUNITY): Payer: Self-pay | Admitting: Surgery

## 2019-03-13 ENCOUNTER — Ambulatory Visit (INDEPENDENT_AMBULATORY_CARE_PROVIDER_SITE_OTHER)
Admission: RE | Admit: 2019-03-13 | Discharge: 2019-03-13 | Disposition: A | Discharge status: Home / Self Care | Payer: Medicare Other | Source: Ambulatory Visit | Attending: Nurse Practitioner | Admitting: Nurse Practitioner

## 2019-03-13 ENCOUNTER — Other Ambulatory Visit: Payer: Self-pay

## 2019-03-13 DIAGNOSIS — N200 Calculus of kidney: Secondary | ICD-10-CM | POA: Diagnosis not present

## 2019-03-13 DIAGNOSIS — R1032 Left lower quadrant pain: Secondary | ICD-10-CM

## 2019-03-13 DIAGNOSIS — K5792 Diverticulitis of intestine, part unspecified, without perforation or abscess without bleeding: Secondary | ICD-10-CM | POA: Diagnosis not present

## 2019-03-13 DIAGNOSIS — K760 Fatty (change of) liver, not elsewhere classified: Secondary | ICD-10-CM | POA: Diagnosis not present

## 2019-03-17 DIAGNOSIS — M5416 Radiculopathy, lumbar region: Secondary | ICD-10-CM | POA: Diagnosis not present

## 2019-03-17 DIAGNOSIS — M9903 Segmental and somatic dysfunction of lumbar region: Secondary | ICD-10-CM | POA: Diagnosis not present

## 2019-03-17 DIAGNOSIS — M9902 Segmental and somatic dysfunction of thoracic region: Secondary | ICD-10-CM | POA: Diagnosis not present

## 2019-03-17 DIAGNOSIS — M5032 Other cervical disc degeneration, mid-cervical region, unspecified level: Secondary | ICD-10-CM | POA: Diagnosis not present

## 2019-03-17 DIAGNOSIS — M9901 Segmental and somatic dysfunction of cervical region: Secondary | ICD-10-CM | POA: Diagnosis not present

## 2019-03-17 DIAGNOSIS — M5414 Radiculopathy, thoracic region: Secondary | ICD-10-CM | POA: Diagnosis not present

## 2019-03-18 DIAGNOSIS — K5780 Diverticulitis of intestine, part unspecified, with perforation and abscess without bleeding: Secondary | ICD-10-CM | POA: Diagnosis not present

## 2019-03-19 DIAGNOSIS — M9903 Segmental and somatic dysfunction of lumbar region: Secondary | ICD-10-CM | POA: Diagnosis not present

## 2019-03-19 DIAGNOSIS — M5416 Radiculopathy, lumbar region: Secondary | ICD-10-CM | POA: Diagnosis not present

## 2019-03-19 DIAGNOSIS — M5032 Other cervical disc degeneration, mid-cervical region, unspecified level: Secondary | ICD-10-CM | POA: Diagnosis not present

## 2019-03-19 DIAGNOSIS — M9901 Segmental and somatic dysfunction of cervical region: Secondary | ICD-10-CM | POA: Diagnosis not present

## 2019-03-19 DIAGNOSIS — M5414 Radiculopathy, thoracic region: Secondary | ICD-10-CM | POA: Diagnosis not present

## 2019-03-19 DIAGNOSIS — F4310 Post-traumatic stress disorder, unspecified: Secondary | ICD-10-CM | POA: Diagnosis not present

## 2019-03-19 DIAGNOSIS — M9902 Segmental and somatic dysfunction of thoracic region: Secondary | ICD-10-CM | POA: Diagnosis not present

## 2019-03-26 DIAGNOSIS — F4310 Post-traumatic stress disorder, unspecified: Secondary | ICD-10-CM | POA: Diagnosis not present

## 2019-03-31 DIAGNOSIS — F4310 Post-traumatic stress disorder, unspecified: Secondary | ICD-10-CM | POA: Diagnosis not present

## 2019-04-02 DIAGNOSIS — M5032 Other cervical disc degeneration, mid-cervical region, unspecified level: Secondary | ICD-10-CM | POA: Diagnosis not present

## 2019-04-02 DIAGNOSIS — F4310 Post-traumatic stress disorder, unspecified: Secondary | ICD-10-CM | POA: Diagnosis not present

## 2019-04-02 DIAGNOSIS — M9903 Segmental and somatic dysfunction of lumbar region: Secondary | ICD-10-CM | POA: Diagnosis not present

## 2019-04-02 DIAGNOSIS — M5414 Radiculopathy, thoracic region: Secondary | ICD-10-CM | POA: Diagnosis not present

## 2019-04-02 DIAGNOSIS — M9902 Segmental and somatic dysfunction of thoracic region: Secondary | ICD-10-CM | POA: Diagnosis not present

## 2019-04-02 DIAGNOSIS — M5416 Radiculopathy, lumbar region: Secondary | ICD-10-CM | POA: Diagnosis not present

## 2019-04-02 DIAGNOSIS — M9901 Segmental and somatic dysfunction of cervical region: Secondary | ICD-10-CM | POA: Diagnosis not present

## 2019-04-03 ENCOUNTER — Other Ambulatory Visit (HOSPITAL_COMMUNITY): Payer: Medicare Other

## 2019-04-03 ENCOUNTER — Encounter (HOSPITAL_COMMUNITY): Admission: RE | Admit: 2019-04-03 | Payer: Medicare Other | Source: Ambulatory Visit

## 2019-04-07 DIAGNOSIS — M5032 Other cervical disc degeneration, mid-cervical region, unspecified level: Secondary | ICD-10-CM | POA: Diagnosis not present

## 2019-04-07 DIAGNOSIS — M9903 Segmental and somatic dysfunction of lumbar region: Secondary | ICD-10-CM | POA: Diagnosis not present

## 2019-04-07 DIAGNOSIS — M5416 Radiculopathy, lumbar region: Secondary | ICD-10-CM | POA: Diagnosis not present

## 2019-04-07 DIAGNOSIS — F4310 Post-traumatic stress disorder, unspecified: Secondary | ICD-10-CM | POA: Diagnosis not present

## 2019-04-07 DIAGNOSIS — M9901 Segmental and somatic dysfunction of cervical region: Secondary | ICD-10-CM | POA: Diagnosis not present

## 2019-04-07 DIAGNOSIS — M5414 Radiculopathy, thoracic region: Secondary | ICD-10-CM | POA: Diagnosis not present

## 2019-04-07 DIAGNOSIS — M9902 Segmental and somatic dysfunction of thoracic region: Secondary | ICD-10-CM | POA: Diagnosis not present

## 2019-04-09 DIAGNOSIS — M5416 Radiculopathy, lumbar region: Secondary | ICD-10-CM | POA: Diagnosis not present

## 2019-04-09 DIAGNOSIS — M5032 Other cervical disc degeneration, mid-cervical region, unspecified level: Secondary | ICD-10-CM | POA: Diagnosis not present

## 2019-04-09 DIAGNOSIS — M9901 Segmental and somatic dysfunction of cervical region: Secondary | ICD-10-CM | POA: Diagnosis not present

## 2019-04-09 DIAGNOSIS — M5414 Radiculopathy, thoracic region: Secondary | ICD-10-CM | POA: Diagnosis not present

## 2019-04-09 DIAGNOSIS — M9902 Segmental and somatic dysfunction of thoracic region: Secondary | ICD-10-CM | POA: Diagnosis not present

## 2019-04-09 DIAGNOSIS — M9903 Segmental and somatic dysfunction of lumbar region: Secondary | ICD-10-CM | POA: Diagnosis not present

## 2019-04-13 DIAGNOSIS — M9901 Segmental and somatic dysfunction of cervical region: Secondary | ICD-10-CM | POA: Diagnosis not present

## 2019-04-13 DIAGNOSIS — M9903 Segmental and somatic dysfunction of lumbar region: Secondary | ICD-10-CM | POA: Diagnosis not present

## 2019-04-13 DIAGNOSIS — M5032 Other cervical disc degeneration, mid-cervical region, unspecified level: Secondary | ICD-10-CM | POA: Diagnosis not present

## 2019-04-13 DIAGNOSIS — M5414 Radiculopathy, thoracic region: Secondary | ICD-10-CM | POA: Diagnosis not present

## 2019-04-13 DIAGNOSIS — M5416 Radiculopathy, lumbar region: Secondary | ICD-10-CM | POA: Diagnosis not present

## 2019-04-13 DIAGNOSIS — M9902 Segmental and somatic dysfunction of thoracic region: Secondary | ICD-10-CM | POA: Diagnosis not present

## 2019-04-14 DIAGNOSIS — F4310 Post-traumatic stress disorder, unspecified: Secondary | ICD-10-CM | POA: Diagnosis not present

## 2019-04-15 DIAGNOSIS — M9901 Segmental and somatic dysfunction of cervical region: Secondary | ICD-10-CM | POA: Diagnosis not present

## 2019-04-15 DIAGNOSIS — M5416 Radiculopathy, lumbar region: Secondary | ICD-10-CM | POA: Diagnosis not present

## 2019-04-15 DIAGNOSIS — M9902 Segmental and somatic dysfunction of thoracic region: Secondary | ICD-10-CM | POA: Diagnosis not present

## 2019-04-15 DIAGNOSIS — M9903 Segmental and somatic dysfunction of lumbar region: Secondary | ICD-10-CM | POA: Diagnosis not present

## 2019-04-15 DIAGNOSIS — M5032 Other cervical disc degeneration, mid-cervical region, unspecified level: Secondary | ICD-10-CM | POA: Diagnosis not present

## 2019-04-15 DIAGNOSIS — M5414 Radiculopathy, thoracic region: Secondary | ICD-10-CM | POA: Diagnosis not present

## 2019-04-15 NOTE — Patient Instructions (Addendum)
DUE TO COVID-19 ONLY ONE VISITOR IS ALLOWED TO COME WITH YOU AND STAY IN THE WAITING ROOM ONLY DURING PRE OP AND PROCEDURE DAY OF SURGERY. THE 1 VISITOR MAY VISIT WITH YOU AFTER SURGERY IN YOUR PRIVATE ROOM DURING VISITING HOURS ONLY!   ONCE YOUR COVID TEST IS COMPLETED, PLEASE BEGIN THE QUARANTINE INSTRUCTIONS AS OUTLINED IN YOUR HANDOUT.                Tamara [name]    Your procedure is scheduled on: Monday 04/20/2019   Report to [facility] Main  Entrance    Report to Short Stay at  [place] AM     Call this number if you have problems the morning of surgery [phone]    Remember: Do not eat food  :After Midnight  As you will be following a Bowel Prep on Sunday 04/19/2019!              Follow Bowel Prep instructions from Dr. Pollie Friar office the day before surgery on Sunday 04/19/2019 and a clear liquid diet all day until midnight.  DRINK 2 PRESURGERY ENSURE DRINKS THE NIGHT BEFORE SURGERY AT  1000 PM AND 1 PRESURGERY DRINK THE DAY OF THE PROCEDURE 3 HOURS PRIOR TO SCHEDULED SURGERY. NO SOLIDS AFTER MIDNIGHT THE DAY PRIOR TO THE SURGERY. NOTHING BY MOUTH EXCEPT CLEAR LIQUIDS UNTIL THREE HOURS PRIOR TO SCHEDULED SURGERY. PLEASE FINISH PRESURGERY ENSURE DRINK PER SURGEON ORDER 3 HOURS PRIOR TO SCHEDULED SURGERY TIME WHICH NEEDS TO BE COMPLETED AT 0430 am.    CLEAR LIQUID DIET   Foods Allowed                                                                     Foods Excluded  Coffee and tea, regular and decaf                             liquids that you cannot  Plain Jell-O any favor except red or purple                                           see through such as: Fruit ices (not with fruit pulp)                                     milk, soups, orange juice  Iced Popsicles                                    All solid food Carbonated beverages, regular and diet                                    Cranberry, grape and apple juices Sports drinks like Gatorade Lightly  seasoned clear broth or consume(fat free) Sugar, honey syrup  Sample Menu Breakfast  Lunch                                     Supper Cranberry juice                    Beef broth                            Chicken broth Jell-O                                     Grape juice                           Apple juice Coffee or tea                        Jell-O                                      Popsicle                                                Coffee or tea                        Coffee or tea  _____________________________________________________________________                BRUSH YOUR TEETH MORNING OF SURGERY AND RINSE YOUR MOUTH OUT, NO CHEWING GUM CANDY OR MINTS.     Take these medicines the morning of surgery with A SIP OF WATER: Oxcarbazine (Trileptal), Vilazodone HCL (Viibryd), Dexlansoprazole (Dexilant), use Albuterol inhaler if needed and bring inhaler with  you to the hospital                                 You may not have any metal on your body including hair pins and              piercings  Do not wear jewelry, make-up, lotions, powders or perfumes, deodorant             Do not wear nail polish on your fingernails.  Do not shave  48 hours prior to surgery.               Do not bring valuables to the hospital. [place].  Contacts, dentures or bridgework may not be worn into surgery.  Leave suitcase in the car. After surgery it may be brought to your room.                   Please read over the following fact sheets you were given: _____________________________________________________________________             [facility] - Preparing for Surgery Before surgery, you can play an important role.  Because skin is not sterile, your skin  needs to be as free of germs as possible.  You can reduce the number of germs on your skin by washing with CHG (chlorahexidine gluconate) soap before  surgery.  CHG is an antiseptic cleaner which kills germs and bonds with the skin to continue killing germs even after washing. Please DO NOT use if you have an allergy to CHG or antibacterial soaps.  If your skin becomes reddened/irritated stop using the CHG and inform your nurse when you arrive at Short Stay. Do not shave (including legs and underarms) for at least 48 hours prior to the first CHG shower.  You may shave your face/neck. Please follow these instructions carefully:  1.  Shower with CHG Soap the night before surgery and the  morning of Surgery.  2.  If you choose to wash your hair, wash your hair first as usual with your  normal  shampoo.  3.  After you shampoo, rinse your hair and body thoroughly to remove the  shampoo.                           4.  Use CHG as you would any other liquid soap.  You can apply chg directly  to the skin and wash                       Gently with a scrungie or clean washcloth.  5.  Apply the CHG Soap to your body ONLY FROM THE NECK DOWN.   Do not use on face/ open                           Wound or open sores. Avoid contact with eyes, ears mouth and genitals (private parts).                       Wash face,  Genitals (private parts) with your normal soap.             6.  Wash thoroughly, paying special attention to the area where your surgery  will be performed.  7.  Thoroughly rinse your body with warm water from the neck down.  8.  DO NOT shower/wash with your normal soap after using and rinsing off  the CHG Soap.                9.  Pat yourself dry with a clean towel.            10.  Wear clean pajamas.            11.  Place clean sheets on your bed the night of your first shower and do not  sleep with pets. Day of Surgery : Do not apply any lotions/deodorants the morning of surgery.  Please wear clean clothes to the hospital/surgery center.  FAILURE TO FOLLOW THESE INSTRUCTIONS MAY RESULT IN THE CANCELLATION OF YOUR SURGERY PATIENT  SIGNATURE_________________________________  NURSE SIGNATURE__________________________________  ________________________________________________________________________   Tamara [name]  An incentive spirometer is a tool that can help keep your lungs clear and active. This tool measures how well you are filling your lungs with each breath. Taking long deep breaths may help reverse or decrease the chance of developing breathing (pulmonary) problems (especially infection) following:  A long period of time when you are unable to move or be active. BEFORE THE PROCEDURE   If the spirometer includes an indicator to  show your best effort, your nurse or respiratory therapist will set it to a desired goal.  If possible, sit up straight or lean slightly forward. Try not to slouch.  Hold the incentive spirometer in an upright position. INSTRUCTIONS FOR USE  1. Sit on the edge of your bed if possible, or sit up as far as you can in bed or on a chair. 2. Hold the incentive spirometer in an upright position. 3. Breathe out normally. 4. Place the mouthpiece in your mouth and seal your lips tightly around it. 5. Breathe in slowly and as deeply as possible, raising the piston or the ball toward the top of the column. 6. Hold your breath for 3-5 seconds or for as long as possible. Allow the piston or ball to fall to the bottom of the column. 7. Remove the mouthpiece from your mouth and breathe out normally. 8. Rest for a few seconds and repeat Steps 1 through 7 at least 10 times every 1-2 hours when you are awake. Take your time and take a few normal breaths between deep breaths. 9. The spirometer may include an indicator to show your best effort. Use the indicator as a goal to work toward during each repetition. 10. After each set of 10 deep breaths, practice coughing to be sure your lungs are clear. If you have an incision (the cut made at the time of surgery), support your incision when coughing  by placing a pillow or rolled up towels firmly against it. Once you are able to get out of bed, walk around indoors and cough well. You may stop using the incentive spirometer when instructed by your caregiver.  RISKS AND COMPLICATIONS  Take your time so you do not get dizzy or light-headed.  If you are in pain, you may need to take or ask for pain medication before doing incentive spirometry. It is harder to take a deep breath if you are having pain. AFTER USE  Rest and breathe slowly and easily.  It can be helpful to keep track of a log of your progress. Your caregiver can provide you with a simple table to help with this. If you are using the spirometer at home, follow these instructions: [place] IF:   You are having difficultly using the spirometer.  You have trouble using the spirometer as often as instructed.  Your pain medication is not giving enough relief while using the spirometer.  You develop fever of 100.5 F (38.1 C) or higher. SEEK IMMEDIATE MEDICAL CARE IF:   You cough up bloody sputum that had not been present before.  You develop fever of 102 F (38.9 C) or greater.  You develop worsening pain at or near the incision site. MAKE SURE YOU:   Understand these instructions.  Will watch your condition.  Will get help right away if you are not doing well or get worse. Document Released: 10/29/2006 Document Revised: 09/10/2011 Document Reviewed: 12/30/2006 [facility] Patient Information 2014 [place], [place].   ________________________________________________________________________

## 2019-04-16 ENCOUNTER — Other Ambulatory Visit: Payer: Self-pay

## 2019-04-16 ENCOUNTER — Encounter (HOSPITAL_COMMUNITY): Payer: Self-pay

## 2019-04-16 ENCOUNTER — Encounter (HOSPITAL_COMMUNITY)
Admission: RE | Admit: 2019-04-16 | Discharge: 2019-04-16 | Disposition: A | Discharge status: Home / Self Care | Payer: Medicare Other | Source: Ambulatory Visit | Attending: Surgery | Admitting: Surgery

## 2019-04-16 ENCOUNTER — Other Ambulatory Visit (HOSPITAL_COMMUNITY)
Admission: RE | Admit: 2019-04-16 | Discharge: 2019-04-16 | Disposition: A | Discharge status: Home / Self Care | Payer: Medicare Other | Source: Ambulatory Visit | Attending: Surgery | Admitting: Surgery

## 2019-04-16 DIAGNOSIS — I10 Essential (primary) hypertension: Secondary | ICD-10-CM | POA: Insufficient documentation

## 2019-04-16 DIAGNOSIS — K5792 Diverticulitis of intestine, part unspecified, without perforation or abscess without bleeding: Secondary | ICD-10-CM | POA: Insufficient documentation

## 2019-04-16 DIAGNOSIS — Z20828 Contact with and (suspected) exposure to other viral communicable diseases: Secondary | ICD-10-CM | POA: Diagnosis not present

## 2019-04-16 DIAGNOSIS — Z01818 Encounter for other preprocedural examination: Secondary | ICD-10-CM | POA: Insufficient documentation

## 2019-04-16 LAB — COMPREHENSIVE METABOLIC PANEL
ALT: 16 U/L (ref 0–44)
AST: 23 U/L (ref 15–41)
Albumin: 4.5 g/dL (ref 3.5–5.0)
Alkaline Phosphatase: 74 U/L (ref 38–126)
Anion gap: 10 (ref 5–15)
BUN: 21 mg/dL — ABNORMAL HIGH (ref 6–20)
CO2: 22 mmol/L (ref 22–32)
Calcium: 9.5 mg/dL (ref 8.9–10.3)
Chloride: 105 mmol/L (ref 98–111)
Creatinine, Ser: 1.21 mg/dL — ABNORMAL HIGH (ref 0.44–1.00)
GFR calc Af Amer: 58 mL/min — ABNORMAL LOW (ref >60)
GFR calc non Af Amer: 50 mL/min — ABNORMAL LOW (ref >60)
Glucose, Bld: 133 mg/dL — ABNORMAL HIGH (ref 70–99)
Potassium: 3.7 mmol/L (ref 3.5–5.1)
Sodium: 137 mmol/L (ref 135–145)
Total Bilirubin: 1.1 mg/dL (ref 0.3–1.2)
Total Protein: 7.5 g/dL (ref 6.5–8.1)

## 2019-04-16 LAB — CBC WITH DIFFERENTIAL/PLATELET
Abs Immature Granulocytes: 0.03 10*3/uL (ref 0.00–0.07)
Basophils Absolute: 0.1 10*3/uL (ref 0.0–0.1)
Basophils Relative: 1 %
Eosinophils Absolute: 0.2 10*3/uL (ref 0.0–0.5)
Eosinophils Relative: 3 %
HCT: 42.9 % (ref 36.0–46.0)
Hemoglobin: 14 g/dL (ref 12.0–15.0)
Immature Granulocytes: 1 %
Lymphocytes Relative: 42 %
Lymphs Abs: 2.3 10*3/uL (ref 0.7–4.0)
MCH: 28.5 pg (ref 26.0–34.0)
MCHC: 32.6 g/dL (ref 30.0–36.0)
MCV: 87.4 fL (ref 80.0–100.0)
Monocytes Absolute: 0.4 10*3/uL (ref 0.1–1.0)
Monocytes Relative: 7 %
Neutro Abs: 2.6 10*3/uL (ref 1.7–7.7)
Neutrophils Relative %: 46 %
Platelets: 300 10*3/uL (ref 150–400)
RBC: 4.91 MIL/uL (ref 3.87–5.11)
RDW: 12.7 % (ref 11.5–15.5)
WBC: 5.6 10*3/uL (ref 4.0–10.5)
nRBC: 0 % (ref 0.0–0.2)

## 2019-04-16 LAB — HEMOGLOBIN A1C
Hgb A1c MFr Bld: 5.3 % (ref 4.8–5.6)
Mean Plasma Glucose: 105.41 mg/dL

## 2019-04-16 NOTE — Progress Notes (Signed)
PCP - Dr. Jilda Panda Cardiologist - Dr. Sallyanne Kuster  LOV-02/22/2017-was seen for pre-cordial chest pain  Chest x-ray - none EKG - 04/16/2019 Stress Test - 02/27/2017 ECHO - 10/22/2013 Cardiac Cath - None  Sleep Study - none CPAP -none   Fasting Blood Sugar -none  Checks Blood Sugar _____ times a day  Blood Thinner Instructions:none Aspirin Instructions:As[pirin 325 mg preventive treatment from Dr. Sallyanne Kuster Last Dose:04/15/2019  Anesthesia review:   Patient has a history of PTSD, HTN,anemia  Patient denies shortness of breath, fever, cough and chest pain at PAT appointment   Patient verbalized understanding of instructions that were given to them at the PAT appointment. Patient was also instructed that they will need to review over the PAT instructions again at home before surgery.

## 2019-04-18 LAB — NOVEL CORONAVIRUS, NAA (HOSP ORDER, SEND-OUT TO REF LAB; TAT 18-24 HRS): SARS-CoV-2, NAA: NOT DETECTED

## 2019-04-18 NOTE — H&P (Signed)
Tamara [name]  Location: [facility] Surgery Patient #: [id number] DOB: 1960-09-14 Divorced / Language: Cleophus Molt / Race: White Female  History of Present Illness   The patient is a 58 year old female who presents with a complaint of diverticulitis.  The PCP is Dr. Nevada Crane  GI - Dr. Henrene Pastor  Holistic medicine support - Dr. Kalman Shan (started seeing in 2019)  She comes by herself.  [The Covid-19 virus has disrupted normal medical care in [place] and across the nation. We have sometimes had to alter normal surgical/medical care to limit this epidemic and we have explained these changes to the patient.]  She has had complicated diverticulitis which had required 2 hospitalizations. She did well the first half of this year and I last saw her in June 2020, she was hoping with a change in her diet, she could escape surgery. Since that time though she's had 2 attacks of diverticulitis. She's was recently put on Cipro and Flagyl for 10 days which she finishes tomorrow. She was put on this by Wallace Going at [place]. She is scheduled for a CT scan of her abdomen this afternoon. It looks like were exhausting the medical management of her diverticular disease.  I reviewed with the patient the findings and need for colon surgery. I discussed both surgical and non surgical options. I discussed the role of laparoscopic and open surgery in colon surgery. I reviewed the risks of surgery, including, but not limited to, infection, bleeding, nerve injury, anastomotic leaks, and possibility of colostomy. I went over the preoperative mechanical and antibiotic bowel prep for colon surgery and provided prescriptions for these. I provided the patient literature on colon surgery. I tried to answer all questions for the patient. The biggest thing that we talked about was the possible need for colostomy. I think that she has a good understanding of what the  surgical risks are.  Plan: 1) For CT of abdomen this afternoon - review this, 2) bowel prep - mechanical and antibiotic, 3) Laparoscopic sigmoid colectomy  Review of Systems as stated in this history (HPI) or in the review of systems. Otherwise all other 12 point ROS are negative  Past Medical History: 1. Diverticulitis sees Dr. Henrene Pastor for GI She was hospitalized from 03/14/2018 - 03/21/2018 for diverticulitis. The perf in January 2020, is in the same area as a CT scan on 03/14/2018. She had one bout prior to that hospitalization. And she had one bout in November 2019, but the CT scan was fairly unremarkable. She sees Dr. Henrene Pastor for GI. She had a colonoscopy 02/16/2017 which just showed diverticulosis. He did mention rectosigmoid stenosis - but no photos of the stenosis. She was hospitalized from July 12, 2018 until July 17, 2018 [facility] long hospital for diverticulitis. She required a percutaneous drain of pelvic fluid/air collection. Since discharge she had a repeat CT scan and sinus tract study on 24 July 2018 which showed no fistula and the drain was removed. 2. Depression/Anxiety 3. GERD with moderate sized HH 4. HTN 5. PTSD Sees Dr. Casimiro Needle 6. Obesity - BMI 32 7. Atherosclerosis 8. History of kidney stones and "weak" right ureter 9. History of open cholecystectomy (1986) and partial hysterectomy with removal of fibroid through in a Pfannenstiel incision (2000)  Social History: Boy friend - Hayes Ludwig of 20 years (he has MS and does not come to the hospital) - She is careful about going out and bring stuff around him, sick he is disabled. On disability for mental  health reasons since 2009. She takes care of he step mother.  She is with her uncle, Shirlee Latch. (they are about the same age)   Allergies (Tanisha A. Owens Shark, [place]; 03/13/2019 9:39 AM) Sulfa Drugs  Dermatitis. Erythromycin Derivatives   Diarrhea. Penicillin G Pot in Dextrose *PENICILLINS*  Allergies Reconciled   Medication History (Tanisha A. Owens Shark, RMA; 03/13/2019 9:39 AM) Temazepam (30MG  Capsule, Oral) Active. Fenofibrate (160MG  Tablet, Oral) Active. metroNIDAZOLE (500MG  Tablet, Oral) Active. OXcarbazepine (300MG  Tablet, Oral) Active. Ciprofloxacin HCl (500MG  Tablet, Oral) Active. Losartan Potassium (25MG  Tablet, Oral) Active. Xanax (0.25MG  Tablet, Oral) Active. Albuterol Sulfate (0.63MG /3ML Nebulized Soln, Inhalation) Active. Medications Reconciled  Vitals (Tanisha A. Brown RMA; 03/13/2019 9:39 AM) 03/13/2019 9:38 AM Weight: 186.6 lb Height: 64in Body Surface Area: 1.9 m Body Mass Index: 32.03 kg/m  Temp.: 97.48F  Pulse: 98 (Regular)  BP: 134/84(Sitting, Left Arm, Standard)   Physical Exam  General: Mildly obese WF who is alert and generally healthy appearing. She is wearing a mask. HEENT: Normal. Pupils equal.  Neck: Supple. No mass. No thyroid mass.  Lymph Nodes: No supraclavicular or cervical nodes.  Lungs: Clear to auscultation and symmetric breath sounds. Heart: RRR. No murmur or rub.  Abdomen: Soft. No mass. No hernia. Normal bowel sounds.   Right subcostal scar. Mild soreness in LLQ. She is more apple than pear.  Extremities: Good strength and ROM in upper and lower extremities.  Assessment & Plan  1.  Sigmoid colon DIVERTICULITIS, recurrent.  With possible stenosis in area of recurrent disease  Plan:   1) For CT of abdomen this afternoon - review this  2) bowel prep - mechanical and antibiotic   3) Laparoscopic sigmoid colectomy  2. Depression/Anxiety 3. GERD with moderate sized HH 4. HTN 5. PTSD Sees Dr. Casimiro Needle 6. Obesity - BMI 32 7. Atherosclerosis 8. History of kidney stones and "weak" right ureter 9. History of open cholecystectomy (1986) and partial hysterectomy with removal of fibroid through in a Pfannenstiel incision  (2000)  Alphonsa Overall, MD, [facility] Surgery Office phone:  [phone]

## 2019-04-19 MED ORDER — BUPIVACAINE LIPOSOME 1.3 % IJ SUSP
20.0000 mL | Freq: Once | INTRAMUSCULAR | Status: DC
  Filled 2019-04-19: qty 20

## 2019-04-19 NOTE — Anesthesia Preprocedure Evaluation (Addendum)
Anesthesia Evaluation  Patient identified by MRN, date of birth, ID band Patient awake    Reviewed: Allergy & Precautions, NPO status , Patient's Chart, lab work & pertinent test results  Airway Mallampati: II  TM Distance: >3 FB Neck ROM: Full    Dental no notable dental hx. (+) Teeth Intact, Dental Advisory Given   Pulmonary asthma ,    Pulmonary exam normal breath sounds clear to auscultation       Cardiovascular hypertension, Pt. on medications negative cardio ROS Normal cardiovascular exam Rhythm:Regular Rate:Normal     Neuro/Psych  Headaches, PSYCHIATRIC DISORDERS Anxiety Depression PTSD, short term memory loss Takes xanax BID, last dose 0.5mg  PO at 1:30amCVA, No Residual Symptoms    GI/Hepatic Neg liver ROS, GERD  Medicated,Diverticulosis GERD, hiatal hernia- poorly controlled, sleeps with wedge under her to prevent acid reflux when lying flat at night   Endo/Other  negative endocrine ROS  Renal/GU CRFRenal diseaseLast Cr 1.2  negative genitourinary   Musculoskeletal negative musculoskeletal ROS (+)   Abdominal (+) + obese,   Peds negative pediatric ROS (+)  Hematology negative hematology ROS (+)   Anesthesia Other Findings   Reproductive/Obstetrics negative OB ROS                            Anesthesia Physical Anesthesia Plan  ASA: III  Anesthesia Plan: General   Post-op Pain Management:    Induction: Intravenous  PONV Risk Score and Plan: Ondansetron, Dexamethasone, Midazolam and Treatment may vary due to age or medical condition  Airway Management Planned: Oral ETT  Additional Equipment: None  Intra-op Plan:   Post-operative Plan: Extubation in OR  Informed Consent: I have reviewed the patients History and Physical, chart, labs and discussed the procedure including the risks, benefits and alternatives for the proposed anesthesia with the patient or authorized  representative who has indicated his/her understanding and acceptance.     Dental advisory given  Plan Discussed with: CRNA  Anesthesia Plan Comments:         Anesthesia Quick Evaluation

## 2019-04-20 ENCOUNTER — Encounter (HOSPITAL_COMMUNITY): Payer: Self-pay | Admitting: *Deleted

## 2019-04-20 ENCOUNTER — Encounter (HOSPITAL_COMMUNITY)
Admission: RE | Disposition: A | Discharge status: Home / Self Care | Payer: Self-pay | Source: Home / Self Care | Attending: Surgery

## 2019-04-20 ENCOUNTER — Other Ambulatory Visit: Payer: Self-pay

## 2019-04-20 ENCOUNTER — Inpatient Hospital Stay (HOSPITAL_COMMUNITY): Payer: Medicare Other | Admitting: Physician Assistant

## 2019-04-20 ENCOUNTER — Inpatient Hospital Stay (HOSPITAL_COMMUNITY): Payer: Medicare Other | Admitting: Anesthesiology

## 2019-04-20 ENCOUNTER — Inpatient Hospital Stay (HOSPITAL_COMMUNITY)
Admission: RE | Admit: 2019-04-20 | Discharge: 2019-04-23 | DRG: 331 | Disposition: A | Discharge status: Home / Self Care | Payer: Medicare Other | Attending: Surgery | Admitting: Surgery

## 2019-04-20 DIAGNOSIS — Z79899 Other long term (current) drug therapy: Secondary | ICD-10-CM | POA: Diagnosis not present

## 2019-04-20 DIAGNOSIS — Z87442 Personal history of urinary calculi: Secondary | ICD-10-CM

## 2019-04-20 DIAGNOSIS — Z90711 Acquired absence of uterus with remaining cervical stump: Secondary | ICD-10-CM

## 2019-04-20 DIAGNOSIS — Z888 Allergy status to other drugs, medicaments and biological substances status: Secondary | ICD-10-CM | POA: Diagnosis not present

## 2019-04-20 DIAGNOSIS — F329 Major depressive disorder, single episode, unspecified: Secondary | ICD-10-CM | POA: Diagnosis not present

## 2019-04-20 DIAGNOSIS — I10 Essential (primary) hypertension: Secondary | ICD-10-CM | POA: Diagnosis not present

## 2019-04-20 DIAGNOSIS — F4310 Post-traumatic stress disorder, unspecified: Secondary | ICD-10-CM | POA: Diagnosis not present

## 2019-04-20 DIAGNOSIS — E669 Obesity, unspecified: Secondary | ICD-10-CM | POA: Diagnosis not present

## 2019-04-20 DIAGNOSIS — Z880 Allergy status to penicillin: Secondary | ICD-10-CM

## 2019-04-20 DIAGNOSIS — K219 Gastro-esophageal reflux disease without esophagitis: Secondary | ICD-10-CM | POA: Diagnosis not present

## 2019-04-20 DIAGNOSIS — K5732 Diverticulitis of large intestine without perforation or abscess without bleeding: Secondary | ICD-10-CM | POA: Diagnosis not present

## 2019-04-20 DIAGNOSIS — J45909 Unspecified asthma, uncomplicated: Secondary | ICD-10-CM | POA: Diagnosis not present

## 2019-04-20 DIAGNOSIS — N736 Female pelvic peritoneal adhesions (postinfective): Secondary | ICD-10-CM | POA: Diagnosis present

## 2019-04-20 DIAGNOSIS — Z6832 Body mass index (BMI) 32.0-32.9, adult: Secondary | ICD-10-CM

## 2019-04-20 DIAGNOSIS — Z882 Allergy status to sulfonamides status: Secondary | ICD-10-CM | POA: Diagnosis not present

## 2019-04-20 DIAGNOSIS — E785 Hyperlipidemia, unspecified: Secondary | ICD-10-CM | POA: Diagnosis not present

## 2019-04-20 DIAGNOSIS — K5730 Diverticulosis of large intestine without perforation or abscess without bleeding: Secondary | ICD-10-CM | POA: Diagnosis not present

## 2019-04-20 HISTORY — PX: LAPAROSCOPIC SIGMOID COLECTOMY: SHX5928

## 2019-04-20 SURGERY — COLECTOMY, SIGMOID, LAPAROSCOPIC
Anesthesia: General

## 2019-04-20 MED ORDER — KETAMINE HCL 10 MG/ML IJ SOLN
INTRAMUSCULAR | Status: AC
  Filled 2019-04-20: qty 1

## 2019-04-20 MED ORDER — TRAMADOL HCL 50 MG PO TABS
50.0000 mg | ORAL_TABLET | Freq: Four times a day (QID) | ORAL | Status: DC | PRN
  Administered 2019-04-20 – 2019-04-23 (×6): 50 mg via ORAL
  Filled 2019-04-20 (×6): qty 1

## 2019-04-20 MED ORDER — VILAZODONE HCL 10 MG PO TABS
30.0000 mg | ORAL_TABLET | Freq: Every day | ORAL | Status: DC
  Administered 2019-04-21 – 2019-04-23 (×3): 30 mg via ORAL
  Filled 2019-04-20 (×5): qty 3

## 2019-04-20 MED ORDER — LIDOCAINE 2% (20 MG/ML) 5 ML SYRINGE
INTRAMUSCULAR | Status: DC | PRN
  Administered 2019-04-20: 20 mg via INTRAVENOUS

## 2019-04-20 MED ORDER — BUPIVACAINE HCL (PF) 0.25 % IJ SOLN
INTRAMUSCULAR | Status: AC
  Filled 2019-04-20: qty 30

## 2019-04-20 MED ORDER — ACETAMINOPHEN 325 MG PO TABS
650.0000 mg | ORAL_TABLET | Freq: Four times a day (QID) | ORAL | Status: DC
  Administered 2019-04-20 – 2019-04-23 (×9): 650 mg via ORAL
  Filled 2019-04-20 (×9): qty 2

## 2019-04-20 MED ORDER — CHLORHEXIDINE GLUCONATE CLOTH 2 % EX PADS
6.0000 | MEDICATED_PAD | Freq: Every day | CUTANEOUS | Status: DC
  Administered 2019-04-20: 6 via TOPICAL

## 2019-04-20 MED ORDER — PROPOFOL 10 MG/ML IV BOLUS
INTRAVENOUS | Status: DC | PRN
  Administered 2019-04-20: 200 mg via INTRAVENOUS

## 2019-04-20 MED ORDER — ACETAMINOPHEN 500 MG PO TABS: 1000.0000 mg | ORAL_TABLET | ORAL | Status: DC

## 2019-04-20 MED ORDER — ONDANSETRON HCL 4 MG/2ML IJ SOLN
INTRAMUSCULAR | Status: AC
  Filled 2019-04-20: qty 2

## 2019-04-20 MED ORDER — HYDROCODONE-ACETAMINOPHEN 7.5-325 MG PO TABS: 1.0000 | ORAL_TABLET | Freq: Once | ORAL | Status: DC | PRN

## 2019-04-20 MED ORDER — ONDANSETRON HCL 4 MG PO TABS
4.0000 mg | ORAL_TABLET | Freq: Four times a day (QID) | ORAL | Status: DC | PRN
  Filled 2019-04-20: qty 1

## 2019-04-20 MED ORDER — ENSURE SURGERY PO LIQD
237.0000 mL | Freq: Two times a day (BID) | ORAL | Status: DC
  Filled 2019-04-20 (×7): qty 237

## 2019-04-20 MED ORDER — HEPARIN SODIUM (PORCINE) 5000 UNIT/ML IJ SOLN
5000.0000 [IU] | Freq: Once | INTRAMUSCULAR | Status: AC
  Administered 2019-04-20: 06:00:00 5000 [IU] via SUBCUTANEOUS
  Filled 2019-04-20: qty 1

## 2019-04-20 MED ORDER — ACETAMINOPHEN 500 MG PO TABS
1000.0000 mg | ORAL_TABLET | Freq: Once | ORAL | Status: AC
  Administered 2019-04-20: 06:00:00 1000 mg via ORAL
  Filled 2019-04-20: qty 2

## 2019-04-20 MED ORDER — SUFENTANIL CITRATE 50 MCG/ML IV SOLN
INTRAVENOUS | Status: AC
  Filled 2019-04-20: qty 1

## 2019-04-20 MED ORDER — PROPOFOL 10 MG/ML IV BOLUS
INTRAVENOUS | Status: AC
  Filled 2019-04-20: qty 20

## 2019-04-20 MED ORDER — ONDANSETRON HCL 4 MG/2ML IJ SOLN
4.0000 mg | Freq: Once | INTRAMUSCULAR | Status: AC | PRN
  Administered 2019-04-20: 12:00:00 4 mg via INTRAVENOUS

## 2019-04-20 MED ORDER — ONDANSETRON HCL 4 MG/2ML IJ SOLN
INTRAMUSCULAR | Status: DC | PRN
  Administered 2019-04-20: 4 mg via INTRAVENOUS

## 2019-04-20 MED ORDER — SUCCINYLCHOLINE CHLORIDE 200 MG/10ML IV SOSY
PREFILLED_SYRINGE | INTRAVENOUS | Status: DC | PRN
  Administered 2019-04-20: 100 mg via INTRAVENOUS

## 2019-04-20 MED ORDER — FENTANYL CITRATE (PF) 100 MCG/2ML IJ SOLN
INTRAMUSCULAR | Status: DC | PRN
  Administered 2019-04-20 (×2): 25 ug via INTRAVENOUS

## 2019-04-20 MED ORDER — DEXAMETHASONE SODIUM PHOSPHATE 10 MG/ML IJ SOLN
INTRAMUSCULAR | Status: DC | PRN
  Administered 2019-04-20: 5 mg via INTRAVENOUS

## 2019-04-20 MED ORDER — HYDROMORPHONE HCL 1 MG/ML IJ SOLN
INTRAMUSCULAR | Status: AC
  Filled 2019-04-20: qty 1

## 2019-04-20 MED ORDER — LACTATED RINGERS IV SOLN
INTRAVENOUS | Status: DC
  Administered 2019-04-20 (×2): via INTRAVENOUS

## 2019-04-20 MED ORDER — ALVIMOPAN 12 MG PO CAPS
12.0000 mg | ORAL_CAPSULE | ORAL | Status: AC
  Administered 2019-04-20: 12 mg via ORAL
  Filled 2019-04-20: qty 1

## 2019-04-20 MED ORDER — MIDAZOLAM HCL 2 MG/2ML IJ SOLN
INTRAMUSCULAR | Status: DC | PRN
  Administered 2019-04-20 (×2): 1 mg via INTRAVENOUS

## 2019-04-20 MED ORDER — ACETAMINOPHEN 500 MG PO TABS: 1000.0000 mg | ORAL_TABLET | Freq: Four times a day (QID) | ORAL | Status: DC

## 2019-04-20 MED ORDER — HYDROMORPHONE HCL 1 MG/ML IJ SOLN
0.2500 mg | INTRAMUSCULAR | Status: DC | PRN
  Administered 2019-04-20 (×2): 0.5 mg via INTRAVENOUS

## 2019-04-20 MED ORDER — ENOXAPARIN SODIUM 40 MG/0.4ML ~~LOC~~ SOLN
40.0000 mg | SUBCUTANEOUS | Status: DC
  Administered 2019-04-21 – 2019-04-23 (×3): 40 mg via SUBCUTANEOUS
  Filled 2019-04-20 (×3): qty 0.4

## 2019-04-20 MED ORDER — HYDROMORPHONE HCL 1 MG/ML IJ SOLN: 0.5000 mg | INTRAMUSCULAR | Status: DC | PRN

## 2019-04-20 MED ORDER — ALPRAZOLAM 1 MG PO TABS
2.0000 mg | ORAL_TABLET | Freq: Once | ORAL | Status: AC
  Administered 2019-04-20: 2 mg via ORAL
  Filled 2019-04-20: qty 2

## 2019-04-20 MED ORDER — CHLORHEXIDINE GLUCONATE CLOTH 2 % EX PADS: 6.0000 | MEDICATED_PAD | Freq: Once | CUTANEOUS | Status: DC

## 2019-04-20 MED ORDER — ROCURONIUM BROMIDE 10 MG/ML (PF) SYRINGE
PREFILLED_SYRINGE | INTRAVENOUS | Status: DC | PRN
  Administered 2019-04-20: 10 mg via INTRAVENOUS
  Administered 2019-04-20: 40 mg via INTRAVENOUS
  Administered 2019-04-20: 10 mg via INTRAVENOUS
  Administered 2019-04-20: 20 mg via INTRAVENOUS

## 2019-04-20 MED ORDER — SUGAMMADEX SODIUM 200 MG/2ML IV SOLN
INTRAVENOUS | Status: DC | PRN
  Administered 2019-04-20: 200 mg via INTRAVENOUS

## 2019-04-20 MED ORDER — ONDANSETRON HCL 4 MG/2ML IJ SOLN: 4.0000 mg | Freq: Four times a day (QID) | INTRAMUSCULAR | Status: DC | PRN

## 2019-04-20 MED ORDER — ALVIMOPAN 12 MG PO CAPS: 12.0000 mg | ORAL_CAPSULE | Freq: Two times a day (BID) | ORAL | Status: DC

## 2019-04-20 MED ORDER — PROMETHAZINE HCL 25 MG/ML IJ SOLN
INTRAMUSCULAR | Status: AC
  Filled 2019-04-20: qty 1

## 2019-04-20 MED ORDER — LACTATED RINGERS IR SOLN
Status: DC | PRN
Start: 2019-04-20 — End: 2019-04-20
  Administered 2019-04-20: 2000 mL

## 2019-04-20 MED ORDER — SODIUM CHLORIDE 0.9 % IV SOLN
INTRAVENOUS | Status: DC | PRN
  Administered 2019-04-20: 25 ug/min via INTRAVENOUS

## 2019-04-20 MED ORDER — FENTANYL CITRATE (PF) 250 MCG/5ML IJ SOLN: INTRAMUSCULAR | Status: DC | PRN

## 2019-04-20 MED ORDER — FENTANYL CITRATE (PF) 100 MCG/2ML IJ SOLN
INTRAMUSCULAR | Status: AC
  Filled 2019-04-20: qty 2

## 2019-04-20 MED ORDER — ALBUTEROL SULFATE (2.5 MG/3ML) 0.083% IN NEBU: 2.5000 mg | INHALATION_SOLUTION | Freq: Four times a day (QID) | RESPIRATORY_TRACT | Status: DC | PRN

## 2019-04-20 MED ORDER — BUPIVACAINE LIPOSOME 1.3 % IJ SUSP
INTRAMUSCULAR | Status: DC | PRN
  Administered 2019-04-20: 20 mL

## 2019-04-20 MED ORDER — MIDAZOLAM HCL 2 MG/2ML IJ SOLN
INTRAMUSCULAR | Status: AC
  Filled 2019-04-20: qty 2

## 2019-04-20 MED ORDER — PROMETHAZINE HCL 25 MG/ML IJ SOLN
12.5000 mg | Freq: Once | INTRAMUSCULAR | Status: AC
  Administered 2019-04-20: 12:00:00 12.5 mg via INTRAVENOUS

## 2019-04-20 MED ORDER — SODIUM CHLORIDE 0.9 % IV SOLN
2.0000 g | INTRAVENOUS | Status: AC
  Administered 2019-04-20: 2 g via INTRAVENOUS
  Filled 2019-04-20: qty 2

## 2019-04-20 MED ORDER — KETAMINE HCL 10 MG/ML IJ SOLN
INTRAMUSCULAR | Status: DC | PRN
  Administered 2019-04-20: 20 mg via INTRAVENOUS
  Administered 2019-04-20: 10 mg via INTRAVENOUS

## 2019-04-20 MED ORDER — HYDROCODONE-ACETAMINOPHEN 5-325 MG PO TABS
1.0000 | ORAL_TABLET | ORAL | Status: DC | PRN
  Administered 2019-04-20: 20:00:00 1 via ORAL
  Administered 2019-04-20: 16:00:00 2 via ORAL
  Administered 2019-04-21 – 2019-04-22 (×4): 1 via ORAL
  Filled 2019-04-20: qty 2
  Filled 2019-04-20 (×5): qty 1

## 2019-04-20 MED ORDER — KCL IN DEXTROSE-NACL 20-5-0.45 MEQ/L-%-% IV SOLN
INTRAVENOUS | Status: DC
  Administered 2019-04-20 – 2019-04-22 (×4): via INTRAVENOUS
  Filled 2019-04-20 (×5): qty 1000

## 2019-04-20 MED ORDER — BUPIVACAINE HCL (PF) 0.25 % IJ SOLN
INTRAMUSCULAR | Status: DC | PRN
  Administered 2019-04-20: 30 mL

## 2019-04-20 MED ORDER — LIDOCAINE 20MG/ML (2%) 15 ML SYRINGE OPTIME
INTRAMUSCULAR | Status: DC | PRN
  Administered 2019-04-20: 1.5 mg/kg/h via INTRAVENOUS

## 2019-04-20 MED ORDER — SUFENTANIL CITRATE 50 MCG/ML IV SOLN
INTRAVENOUS | Status: DC | PRN
  Administered 2019-04-20 (×5): 5 ug via INTRAVENOUS

## 2019-04-20 MED ORDER — VILAZODONE HCL 20 MG PO TABS: 30.0000 mg | ORAL_TABLET | Freq: Every day | ORAL | Status: DC

## 2019-04-20 MED ORDER — 0.9 % SODIUM CHLORIDE (POUR BTL) OPTIME
TOPICAL | Status: DC | PRN
Start: 2019-04-20 — End: 2019-04-20
  Administered 2019-04-20: 2000 mL

## 2019-04-20 SURGICAL SUPPLY — 74 items
APPLIER CLIP 5 13 M/L LIGAMAX5 (MISCELLANEOUS)
APPLIER CLIP ROT 10 11.4 M/L (STAPLE)
BLADE EXTENDED COATED 6.5IN (ELECTRODE) IMPLANT
BLADE HEX COATED 2.75 (ELECTRODE) IMPLANT
CABLE HIGH FREQUENCY MONO STRZ (ELECTRODE) ×3 IMPLANT
CELLS DAT CNTRL 66122 CELL SVR (MISCELLANEOUS) IMPLANT
CLIP APPLIE 5 13 M/L LIGAMAX5 (MISCELLANEOUS) IMPLANT
CLIP APPLIE ROT 10 11.4 M/L (STAPLE) IMPLANT
COVER SURGICAL LIGHT HANDLE (MISCELLANEOUS) ×6 IMPLANT
COVER WAND RF STERILE (DRAPES) IMPLANT
DECANTER SPIKE VIAL GLASS SM (MISCELLANEOUS) ×3 IMPLANT
DERMABOND ADVANCED (GAUZE/BANDAGES/DRESSINGS) ×4
DERMABOND ADVANCED .7 DNX12 (GAUZE/BANDAGES/DRESSINGS) ×2 IMPLANT
DISSECTOR BLUNT TIP ENDO 5MM (MISCELLANEOUS) IMPLANT
DRAIN CHANNEL 19F RND (DRAIN) IMPLANT
DRAPE UTILITY XL STRL (DRAPES) ×3 IMPLANT
DRSG OPSITE POSTOP 4X10 (GAUZE/BANDAGES/DRESSINGS) IMPLANT
DRSG OPSITE POSTOP 4X6 (GAUZE/BANDAGES/DRESSINGS) ×3 IMPLANT
DRSG OPSITE POSTOP 4X8 (GAUZE/BANDAGES/DRESSINGS) IMPLANT
ELECT PENCIL ROCKER SW 15FT (MISCELLANEOUS) ×3 IMPLANT
ELECT REM PT RETURN 15FT ADLT (MISCELLANEOUS) ×3 IMPLANT
EVACUATOR SILICONE 100CC (DRAIN) IMPLANT
GAUZE SPONGE 4X4 12PLY STRL (GAUZE/BANDAGES/DRESSINGS) IMPLANT
GLOVE EUDERMIC 7 POWDERFREE (GLOVE) ×6 IMPLANT
GLOVE SURG SYN 7.5  E (GLOVE) ×4
GLOVE SURG SYN 7.5 E (GLOVE) ×2 IMPLANT
GOWN STRL REUS W/TWL XL LVL3 (GOWN DISPOSABLE) ×15 IMPLANT
HOLDER FOLEY CATH W/STRAP (MISCELLANEOUS) ×3 IMPLANT
KIT SIGMOIDOSCOPE (SET/KITS/TRAYS/PACK) ×3 IMPLANT
KIT TURNOVER KIT A (KITS) IMPLANT
NEEDLE HYPO 25X1 1.5 SAFETY (NEEDLE) ×3 IMPLANT
PACK COLON (CUSTOM PROCEDURE TRAY) ×3 IMPLANT
PAD POSITIONING PINK XL (MISCELLANEOUS) ×3 IMPLANT
PORT LAP GEL ALEXIS MED 5-9CM (MISCELLANEOUS) IMPLANT
PROTECTOR NERVE ULNAR (MISCELLANEOUS) IMPLANT
RELOAD STAPLER GREEN 60MM (STAPLE) ×2 IMPLANT
RTRCTR WOUND ALEXIS 18CM MED (MISCELLANEOUS)
SCISSORS LAP 5X45 EPIX DISP (ENDOMECHANICALS) ×3 IMPLANT
SET IRRIG TUBING LAPAROSCOPIC (IRRIGATION / IRRIGATOR) ×3 IMPLANT
SET TUBE SMOKE EVAC HIGH FLOW (TUBING) ×6 IMPLANT
SHEARS HARMONIC ACE PLUS 36CM (ENDOMECHANICALS) ×3 IMPLANT
SLEEVE ADV FIXATION 5X100MM (TROCAR) ×6 IMPLANT
SPONGE LAP 18X18 RF (DISPOSABLE) ×3 IMPLANT
STAPLER ECHELON LONG 60 440 (INSTRUMENTS) ×3 IMPLANT
STAPLER RELOAD GREEN 60MM (STAPLE) ×6
STAPLER VISISTAT 35W (STAPLE) ×3 IMPLANT
SURGILUBE 2OZ TUBE FLIPTOP (MISCELLANEOUS) IMPLANT
SUT ETHILON 3 0 PS 1 (SUTURE) IMPLANT
SUT MNCRL AB 4-0 PS2 18 (SUTURE) ×6 IMPLANT
SUT NOVA NAB DX-16 0-1 5-0 T12 (SUTURE) IMPLANT
SUT PDS AB 1 CT1 27 (SUTURE) ×12 IMPLANT
SUT PDS AB 1 CTX 36 (SUTURE) IMPLANT
SUT PDS AB 1 TP1 96 (SUTURE) IMPLANT
SUT PROLENE 2 0 KS (SUTURE) ×3 IMPLANT
SUT PROLENE 2 0 SH DA (SUTURE) IMPLANT
SUT SILK 2 0 (SUTURE) ×2
SUT SILK 2 0 SH CR/8 (SUTURE) ×3 IMPLANT
SUT SILK 2-0 18XBRD TIE 12 (SUTURE) ×1 IMPLANT
SUT SILK 3 0 (SUTURE) ×2
SUT SILK 3 0 SH CR/8 (SUTURE) ×3 IMPLANT
SUT SILK 3-0 18XBRD TIE 12 (SUTURE) ×1 IMPLANT
SUT VIC AB 3-0 SH 18 (SUTURE) ×3 IMPLANT
SYS LAPSCP GELPORT 120MM (MISCELLANEOUS)
SYSTEM LAPSCP GELPORT 120MM (MISCELLANEOUS) IMPLANT
TOWEL OR NON WOVEN STRL DISP B (DISPOSABLE) IMPLANT
TRAY FOLEY MTR SLVR 14FR STAT (SET/KITS/TRAYS/PACK) ×3 IMPLANT
TRAY FOLEY MTR SLVR 16FR STAT (SET/KITS/TRAYS/PACK) IMPLANT
TROCAR ADV FIXATION 11X100MM (TROCAR) IMPLANT
TROCAR ADV FIXATION 12X100MM (TROCAR) IMPLANT
TROCAR ADV FIXATION 5X100MM (TROCAR) ×3 IMPLANT
TROCAR BLADELESS OPT 5 100 (ENDOMECHANICALS) ×3 IMPLANT
TROCAR XCEL BLUNT TIP 100MML (ENDOMECHANICALS) IMPLANT
TUBING CONNECTING 10 (TUBING) ×6 IMPLANT
TUBING CONNECTING 10' (TUBING) ×3

## 2019-04-20 NOTE — Addendum Note (Signed)
Addendum  created 04/20/19 1557 by Cynda Familia, CRNA   Flowsheet accepted, Intraprocedure Flowsheets edited

## 2019-04-20 NOTE — Progress Notes (Signed)
Pt up from PACU, very sleepy, per sister has sleep apnea.  Will wake when spoken to.

## 2019-04-20 NOTE — Anesthesia Procedure Notes (Signed)
Date/Time: 04/20/2019 11:17 AM Performed by: Cynda Familia, CRNA Oxygen Delivery Method: Simple face mask Placement Confirmation: positive ETCO2 and breath sounds checked- equal and bilateral Dental Injury: Teeth and Oropharynx as per pre-operative assessment

## 2019-04-20 NOTE — Anesthesia Postprocedure Evaluation (Signed)
Anesthesia Post Note  Patient: Tamara [name]  Procedure(s) Performed: LAPAROSCOPIC SIGMOID COLECTOMY (N/A )     Patient location during evaluation: PACU Anesthesia Type: General Level of consciousness: awake and alert Pain management: pain level controlled Vital Signs Assessment: post-procedure vital signs reviewed and stable Respiratory status: spontaneous breathing, nonlabored ventilation and respiratory function stable Cardiovascular status: blood pressure returned to baseline and stable Postop Assessment: no apparent nausea or vomiting Anesthetic complications: no    Last Vitals:  Vitals:   04/20/19 1215 04/20/19 1246  BP: 116/88 109/67  Pulse: 97 (!) 101  Resp: (!) 23 12  Temp: 36.7 C 36.4 C  SpO2: 94% 98%    Last Pain:  Vitals:   04/20/19 1246  TempSrc: Oral  PainSc:                  Lidia Collum

## 2019-04-20 NOTE — Op Note (Addendum)
04/20/2019  11:32 AM  PATIENT:  Tamara [name], 58 y.o., female, MRN: [id number]  PREOP DIAGNOSIS:  DIVERTICULITIS  POSTOP DIAGNOSIS:   Distal sigmoid colon diverticulitis  PROCEDURE:   Procedure(s):  LAPAROSCOPIC Low anterior resection with sigmoid colon to proximal rectal anastomosis  SURGEON:   Alphonsa Overall, M.D.  Terrence DupontLeane Para, M.D.  ANESTHESIA:   general  Anesthesiologist: Pervis Hocking, DO; Christella Hartigan Burnis Medin, MD CRNA: Lind Covert, CRNA; Montel Clock, CRNA; Cynda Familia, CRNA  General  EBL:  75  ml  BLOOD ADMINISTERED: none  DRAINS: none   LOCAL MEDICATIONS USED:   30 cc 1/4% marcaine an 20 cc of Exparel  SPECIMEN:   Sigmoid colon, sutures distal  COUNTS CORRECT:  YES  INDICATIONS FOR PROCEDURE:  Tamara [name] is a 58 y.o. (DOB: [birth year]) white female whose primary care physician is Jilda Panda, MD and comes for sigmoid colectomy for recurrent diverticulitis.    The indications and risks of the surgery were explained to the patient.  The risks include, but are not limited to, infection, bleeding, leak from the surgery, and nerve injury.  PROCEDURE:  The patient was taken to room number 4 at [facility] and underwent general anesthesia.  She was placed in lithotomy position.  She had a Foley catheter placed.  Her abdomen and perineum were prepped.  She was sterilely draped.   A timeout was held and surgical checklist run.   I accessed her abdomen by making a left upper quadrant incision and inserting a 5 mm Ethicon Optiview trocar.  She had had a prior open right subcostal incision, so I avoided that side.  She had adhesions of omentum to the right upper quadrant anterior peritoneum and adhesions of omentum to her lower midline anterior peritoneum.  After I placed my trochars, I took about 10 minutes taking the omental adhesions down from her lower midline.  I took about 1/3 of the adhesions down from the right upper  quadrant.  There was no obvious abdominal wall hernia.   I placed 4 additional trocars.  I placed a 5 mm trocar in the left lower quadrant, a 5 mm trocar infraumbilical, a 5 mm trocar in the right lower quadrant, and a 5 mm trocar in the right midabdomen.   Abdominal exploration revealed the liver that I could see was unremarkable, the stomach was unremarkable, the bowel that I could see was unremarkable.   I started the dissection in the pelvis and followed the sigmoid colon down to the pelvis.  The distal one half of her sigmoid colon was caught in adhesions, particularly to the left ovary and left pelvic sidewall and anteriorly.  I identified the left ureter and did the colon dissection anterior to the ureter.  I took the dissection down to about 5 cm above the peritoneal reflection where she had normal distal sigmoid colon.   Dr. Dalbert Batman broke scrub and passed the EEA dilators up the rectum.  The patient had a relative stenosis right at the peritoneal reflection.  He was able to get the sigmoidoscope through the narrowing, but could not get the 25 EEA dilator through the narrowing.  When he looked with a rigid sigmoidoscope, there was no obvious mucosal abnormality.  There was no obvious external cause for the stricture.  Multiple attempts were made to pass the dilator more proximally were unsuccessful.   Therefore I dissected down the sigmoid colon and exposed the proximal rectum by about  3 or 4 cm, basically doing a low anterior dissection.  Dr. Dalbert Batman again tried to pass the EEA dilator in the distal sigmoid colon and still could not.  So I decided to do a colo-rectal anastomosis where I had a good target anteriorly in the proximal rectum.   I upsized the RLQ 5 mm trocar to a 12 mm trocar for the Echelon stapler.  I used the 60 mm Ethicon Echelon green stapler to divide the distal sigmoid colon about 2 cm above the peritoneal reflection/rectum.  The Echelon stapler locked and would not open for  about 3 or 4 minutes.  Finally, I was able to open the Echelon stapler.  The staple line looked okay.  There was about 1 cm of bowel that need further division, so I made a second firing of the 60 mm Echelon green stapler (this was a different stapler).  Because of the possible misfiring, Dr. Dalbert Batman sigmoidoscope the patient and forced air into the rectum and there was no leak at the staple line.   I then resected the sigmoid colon more proximally.  I found an area where the colon looked fairly normal and thought I had adequate length to deliver the colon down to the rectal anastomosis.   I made an muscle splitting incision in the left lower quadrant at my 5 mm trocar site and placed a wound protector there.  I delivered the sigmoid colon through the wound protector.  I divided the sigmoid colon proximal to the diseased diverticular segment with a pursestring device.  The distal sigmoid colon has staples, the proximal did not, and this was sent to pathology.   I then used the EEA 29 stapler and placed the anvil in the sigmoid colon.  I had used the pursestring device to divide the bowel and the 2-0 Prolene pursestring in the bowel to tie down around the anvil.  The colon was returned to the abdominal cavity.  Dr. Dalbert Batman returned to access the rectum and passed the #29 EEA stapler through the proximal rectum and I was able to attach the anvil to the stapler.  He fired the stapler without incident and there were good proximal and distal rings.  He did sigmoidoscope the patient and inflate air as I clamped the proximal sigmoid colon and there was no air leak.   I then changed to a clean set up to close the abdomen.   I then irrigated the abdomen with about 500 cc of saline.  There has been minimal blood loss, approximately 75 cc.  The left ovary had been caught up in this inflammatory mass of the sigmoid colon, but I had dissected this off the bowel and left it intact.  The adhesions on the right subcostal  margin were left pretty much intact.   I then closed the left lower quadrant abdominal wound in layers with running #1 PDS suture.  I irrigated the wound with saline.  And I closed each trocar site after the trochars have been removed with 4-0 Monocryl suture.  The wounds were painted with Dermabond.   I had done a TAP block bilaterally with Exparel and 1/4% marcaine at the beginning of the case.  I used the same local in the LLQ wound, for a total of 50 cc.   The patient was transferred to recovery room in good condition.  Sponge and needle count were correct at the end of the case.  Alphonsa Overall, MD, [facility] Surgery Office phone:  [phone]

## 2019-04-20 NOTE — Anesthesia Procedure Notes (Signed)
Procedure Name: Intubation Date/Time: 04/20/2019 7:35 AM Performed by: Cynda Familia, CRNA Pre-anesthesia Checklist: Patient identified, Emergency Drugs available, Suction available and Patient being monitored Patient Re-evaluated:Patient Re-evaluated prior to induction Oxygen Delivery Method: Circle System Utilized Preoxygenation: Pre-oxygenation with 100% oxygen Induction Type: IV induction Ventilation: Mask ventilation without difficulty Laryngoscope Size: Mac and 3 Grade View: Grade II Tube type: Oral Tube size: 7.0 mm Number of attempts: 1 Airway Equipment and Method: Stylet Placement Confirmation: ETT inserted through vocal cords under direct vision,  positive ETCO2 and breath sounds checked- equal and bilateral Secured at: 21 cm Tube secured with: Tape Dental Injury: Teeth and Oropharynx as per pre-operative assessment  Comments: Smooth IV induction by MDA-- intubation by SRNA-- atraumatic-- SRNA assisted and supervised by MDA for induction and intubation

## 2019-04-20 NOTE — Transfer of Care (Signed)
Immediate Anesthesia Transfer of Care Note  Patient: Tamara [name]  Procedure(s) Performed: LAPAROSCOPIC SIGMOID COLECTOMY (N/A )  Patient Location: PACU  Anesthesia Type:General  Level of Consciousness: sedated  Airway & Oxygen Therapy: Patient Spontanous Breathing and Patient connected to face mask oxygen  Post-op Assessment: Report given to RN and Post -op Vital signs reviewed and stable  Post vital signs: Reviewed and stable  Last Vitals:  Vitals Value Taken Time  BP 121/85 04/20/19 1130  Temp    Pulse 91 04/20/19 1130  Resp 13 04/20/19 1130  SpO2 100 % 04/20/19 1130  Vitals shown include unvalidated device data.  Last Pain:  Vitals:   04/20/19 0614  TempSrc: Oral      Patients Stated Pain Goal: 3 ([id number])  Complications: No apparent anesthesia complications

## 2019-04-20 NOTE — Interval H&P Note (Signed)
History and Physical Interval Note:  04/20/2019 7:17 AM  Tamara [name]  has presented today for surgery, with the diagnosis of DIVERTICULITIS.  The various methods of treatment have been discussed with the patient and family.  Abigail Butts, a friend, is going to come over later.  After consideration of risks, benefits and other options for treatment, the patient has consented to  Procedure(s): LAPAROSCOPIC SIGMOID COLECTOMY (N/A) as a surgical intervention.  The patient's history has been reviewed, patient examined, no change in status, stable for surgery.  I have reviewed the patient's chart and labs.  Questions were answered to the patient's satisfaction.     Shann Medal

## 2019-04-20 NOTE — Progress Notes (Signed)
Pt is requesting to have her foley removed in the morning.

## 2019-04-21 ENCOUNTER — Encounter (HOSPITAL_COMMUNITY): Payer: Self-pay | Admitting: Surgery

## 2019-04-21 LAB — BASIC METABOLIC PANEL
Anion gap: 6 (ref 5–15)
BUN: 10 mg/dL (ref 6–20)
CO2: 25 mmol/L (ref 22–32)
Calcium: 8.8 mg/dL — ABNORMAL LOW (ref 8.9–10.3)
Chloride: 104 mmol/L (ref 98–111)
Creatinine, Ser: 1.12 mg/dL — ABNORMAL HIGH (ref 0.44–1.00)
GFR calc Af Amer: >60 mL/min (ref >60)
GFR calc non Af Amer: 54 mL/min — ABNORMAL LOW (ref >60)
Glucose, Bld: 124 mg/dL — ABNORMAL HIGH (ref 70–99)
Potassium: 3.7 mmol/L (ref 3.5–5.1)
Sodium: 135 mmol/L (ref 135–145)

## 2019-04-21 LAB — CBC
HCT: 34.9 % — ABNORMAL LOW (ref 36.0–46.0)
Hemoglobin: 11.2 g/dL — ABNORMAL LOW (ref 12.0–15.0)
MCH: 28.4 pg (ref 26.0–34.0)
MCHC: 32.1 g/dL (ref 30.0–36.0)
MCV: 88.6 fL (ref 80.0–100.0)
Platelets: 206 10*3/uL (ref 150–400)
RBC: 3.94 MIL/uL (ref 3.87–5.11)
RDW: 12.5 % (ref 11.5–15.5)
WBC: 6.2 10*3/uL (ref 4.0–10.5)
nRBC: 0 % (ref 0.0–0.2)

## 2019-04-21 LAB — SURGICAL PATHOLOGY

## 2019-04-21 MED ORDER — LOSARTAN POTASSIUM 50 MG PO TABS
100.0000 mg | ORAL_TABLET | Freq: Every day | ORAL | Status: DC
  Administered 2019-04-21 – 2019-04-23 (×3): 100 mg via ORAL
  Filled 2019-04-21 (×3): qty 2

## 2019-04-21 MED ORDER — ALPRAZOLAM ER 1 MG PO TB24
2.0000 mg | ORAL_TABLET | Freq: Every day | ORAL | Status: DC
  Administered 2019-04-21 – 2019-04-22 (×2): 2 mg via ORAL
  Filled 2019-04-21 (×2): qty 2

## 2019-04-21 MED ORDER — TEMAZEPAM 15 MG PO CAPS
45.0000 mg | ORAL_CAPSULE | Freq: Every evening | ORAL | Status: DC | PRN
  Administered 2019-04-21: 30 mg via ORAL
  Administered 2019-04-22: 23:00:00 45 mg via ORAL
  Filled 2019-04-21 (×2): qty 3

## 2019-04-21 NOTE — Progress Notes (Signed)
[place] [facility] Office:  [phone] General Surgery Progress Note   LOS: 1 day  POD -  1 Day Post-Op  Chief Complaint: Recurrent diverticulitis  Assessment and Plan: 1.  LAPAROSCOPIC SIGMOID COLECTOMY/LAR - 04/20/2019 - Jamaris Theard  Continue currents clear liquids  Discontinue foley 2. Depression/Anxiety 3. GERD with moderate sized HH 4. HTN 5. PTSD Sees Dr. Casimiro Needle 6. DVT prophylaxis - On Lovenox   Active Problems:   Diverticulitis large intestine w/o perforation or abscess w/o bleeding  Subjective:  Did okay overnight.  She had an accident while walking in the hall.   Objective:   Vitals:   04/21/19 0127 04/21/19 0559  BP: 101/78 117/75  Pulse: 92 90  Resp: 20 20  Temp: 98.1 F (36.7 C) 98.2 F (36.8 C)  SpO2: 96% 97%     Intake/Output from previous day:  10/19 0701 - 10/20 0700 In: 2753.2 [P.O.:1; I.V.:2752.2] Out: 2625 [Urine:2550; Blood:75]  Intake/Output this shift:  No intake/output data recorded.   Physical Exam:   General: WN WF who is alert and oriented.    HEENT: Normal. Pupils equal. .   Lungs: Clear.  She pulls 1,500 cc+ on IS.   Abdomen: Soft.  Rare BS.   Wound: Clean   Lab Results:    Recent Labs    04/21/19 0352  WBC 6.2  HGB 11.2*  HCT 34.9*  PLT 206    BMET   Recent Labs    04/21/19 0352  NA 135  K 3.7  CL 104  CO2 25  GLUCOSE 124*  BUN 10  CREATININE 1.12*  CALCIUM 8.8*    PT/INR  No results for input(s): LABPROT, INR in the last 72 hours.  ABG  No results for input(s): PHART, HCO3 in the last 72 hours.  Invalid input(s): PCO2, PO2   Studies/Results:  No results found.   Anti-infectives:   Anti-infectives (From admission, onward)   Start     Dose/Rate Route Frequency Ordered Stop   04/20/19 0615  cefoTEtan (CEFOTAN) 2 g in sodium chloride 0.9 % 100 mL IVPB     2 g 200 mL/hr over 30 Minutes Intravenous On call to O.R. 04/20/19 0601 04/20/19 [id number]      Alphonsa Overall, MD,  [facility] Surgery Office: [phone] 04/21/2019

## 2019-04-21 NOTE — Progress Notes (Signed)
Pt requesting her home med zanax for sleep.  On call MD Ninfa Linden paged and ordered 2 mg xanaz PO once for sleep. Will continue to monitor.

## 2019-04-21 NOTE — Plan of Care (Signed)
  Problem: Education: Goal: Knowledge of General Education information will improve Description: Including pain rating scale, medication(s)/side effects and non-pharmacologic comfort measures Outcome: Progressing   Problem: Activity: Goal: Risk for activity intolerance will decrease Outcome: Progressing   Problem: Coping: Goal: Level of anxiety will decrease Outcome: Progressing   Problem: Nutrition: Goal: Adequate nutrition will be maintained Outcome: Progressing   

## 2019-04-22 LAB — CBC WITH DIFFERENTIAL/PLATELET
Abs Immature Granulocytes: 0.01 10*3/uL (ref 0.00–0.07)
Basophils Absolute: 0.1 10*3/uL (ref 0.0–0.1)
Basophils Relative: 1 %
Eosinophils Absolute: 0.3 10*3/uL (ref 0.0–0.5)
Eosinophils Relative: 6 %
HCT: 35.5 % — ABNORMAL LOW (ref 36.0–46.0)
Hemoglobin: 11.5 g/dL — ABNORMAL LOW (ref 12.0–15.0)
Immature Granulocytes: 0 %
Lymphocytes Relative: 41 %
Lymphs Abs: 2.3 10*3/uL (ref 0.7–4.0)
MCH: 28.5 pg (ref 26.0–34.0)
MCHC: 32.4 g/dL (ref 30.0–36.0)
MCV: 87.9 fL (ref 80.0–100.0)
Monocytes Absolute: 0.4 10*3/uL (ref 0.1–1.0)
Monocytes Relative: 7 %
Neutro Abs: 2.5 10*3/uL (ref 1.7–7.7)
Neutrophils Relative %: 45 %
Platelets: 187 10*3/uL (ref 150–400)
RBC: 4.04 MIL/uL (ref 3.87–5.11)
RDW: 12.5 % (ref 11.5–15.5)
WBC: 5.6 10*3/uL (ref 4.0–10.5)
nRBC: 0 % (ref 0.0–0.2)

## 2019-04-22 LAB — BASIC METABOLIC PANEL
Anion gap: 7 (ref 5–15)
BUN: 6 mg/dL (ref 6–20)
CO2: 25 mmol/L (ref 22–32)
Calcium: 9.3 mg/dL (ref 8.9–10.3)
Chloride: 104 mmol/L (ref 98–111)
Creatinine, Ser: 0.91 mg/dL (ref 0.44–1.00)
GFR calc Af Amer: >60 mL/min (ref >60)
GFR calc non Af Amer: >60 mL/min (ref >60)
Glucose, Bld: 106 mg/dL — ABNORMAL HIGH (ref 70–99)
Potassium: 3.9 mmol/L (ref 3.5–5.1)
Sodium: 136 mmol/L (ref 135–145)

## 2019-04-22 NOTE — Progress Notes (Signed)
Pt with small amount of bright red blood when wiping buttocks near rectum. rn will continue to monitor.

## 2019-04-22 NOTE — Progress Notes (Signed)
[place] [facility] Office:  [phone] General Surgery Progress Note   LOS: 2 days  POD -  2 Days Post-Op  Chief Complaint: Recurrent diverticulitis  Assessment and Plan: 1.  LAPAROSCOPIC SIGMOID COLECTOMY/LAR - 04/20/2019 - Tamara [name]  Small BM's - to advance to full liquids 2. Depression/Anxiety 3. GERD with moderate sized HH 4. HTN 5. PTSD Sees Dr. Casimiro Needle 6. DVT prophylaxis - On Lovenox   Active Problems:   Diverticulitis large intestine w/o perforation or abscess w/o bleeding  Subjective:  Having small liquidy BM's - to increase diet.  Needs to increase activity.  Objective:   Vitals:   04/21/19 2124 04/22/19 0516  BP: 127/90 (!) 139/94  Pulse: 76 90  Resp: 18 20  Temp: 99 F (37.2 C) 98.6 F (37 C)  SpO2: 99% 100%     Intake/Output from previous day:  10/20 0701 - 10/21 0700 In: 3080.8 [P.O.:1440; I.V.:1640.8] Out: -   Intake/Output this shift:  No intake/output data recorded.   Physical Exam:   General: WN WF who is alert and oriented.    HEENT: Normal. Pupils equal. .   Lungs: Clear.     Abdomen: Soft.  Has BS.   Wound: Clean   Lab Results:    Recent Labs    04/21/19 0352 04/22/19 0355  WBC 6.2 5.6  HGB 11.2* 11.5*  HCT 34.9* 35.5*  PLT 206 187    BMET   Recent Labs    04/21/19 0352 04/22/19 0355  NA 135 136  K 3.7 3.9  CL 104 104  CO2 25 25  GLUCOSE 124* 106*  BUN 10 6  CREATININE 1.12* 0.91  CALCIUM 8.8* 9.3    PT/INR  No results for input(s): LABPROT, INR in the last 72 hours.  ABG  No results for input(s): PHART, HCO3 in the last 72 hours.  Invalid input(s): PCO2, PO2   Studies/Results:  No results found.   Anti-infectives:   Anti-infectives (From admission, onward)   Start     Dose/Rate Route Frequency Ordered Stop   04/20/19 0615  cefoTEtan (CEFOTAN) 2 g in sodium chloride 0.9 % 100 mL IVPB     2 g 200 mL/hr over 30 Minutes Intravenous On call to O.R. 04/20/19 0601 04/20/19 [id number]       Alphonsa Overall, MD, [facility] Surgery Office: [phone] 04/22/2019

## 2019-04-23 MED ORDER — TRAMADOL HCL 50 MG PO TABS: 50.0000 mg | ORAL_TABLET | Freq: Four times a day (QID) | ORAL | 0 refills | Status: DC | PRN

## 2019-04-23 NOTE — Discharge Summary (Signed)
Physician Discharge Summary  Patient ID:  Tamara [name]  MRN: [id number]  DOB/AGE: 57-Jan-1962 57 y.o.  Admit date: 04/20/2019 Discharge date: 04/23/2019  Discharge Diagnoses:  1.  Recurrent diverticulitis of the sigmoid colon 2. Depression/Anxiety 3. GERD with moderate sized HH 4. HTN 5. PTSD Sees Dr. Casimiro Needle 6. Obesity - BMI 32 7. Atherosclerosis   Active Problems:   Diverticulitis large intestine w/o perforation or abscess w/o bleeding  Operation: Procedure(s): LAPAROSCOPIC SIGMOID COLECTOMY/Low Anterior resection on 04/20/2019 - [facility]  Discharged Condition: good  Hospital Course: Tamara [name] is an 58 y.o. female whose primary care physician is Jilda Panda, MD and who was admitted 04/20/2019 with a chief complaint of recurrent diverticulitis.   She has had trouble for over a year.  On 12 July 2018, she was hospitalized for a diverticular abscess that required a perc drain.  She did well until the summer of 2020, when she started having recurrent diverticulitis symptoms.  She has seen Dr. Rikki Spearing from GI.  Marland Kitchen   She was brought to the operating room on 04/20/2019 and underwent LAPAROSCOPIC SIGMOID COLECTOMY/Low Anterior resection .   She had scar tissue and evidence of her prior abscess in her pelvis at the time of surgery.  She was started on liquids the afternoon after surgery.  Her diet has been advanced to full liquids, which she has tolerated.  She has has some loose BM's.  She is interested in going home.  She is doing well with ambulation and liquids. She will get a regular lunch and if tolerated, go home today.  The discharge instructions were reviewed with the patient.  Consults: None  Significant Diagnostic Studies: Results for orders placed or performed during the hospital encounter of [id number]  Basic metabolic panel  Result Value Ref Range   Sodium 135 135 - 145 mmol/L   Potassium 3.7 3.5 - 5.1 mmol/L   Chloride 104 98 - 111  mmol/L   CO2 25 22 - 32 mmol/L   Glucose, Bld 124 (H) 70 - 99 mg/dL   BUN 10 6 - 20 mg/dL   Creatinine, Ser 1.12 (H) 0.44 - 1.00 mg/dL   Calcium 8.8 (L) 8.9 - 10.3 mg/dL   GFR calc non Af Amer 54 (L) >60 mL/min   GFR calc Af Amer >60 >60 mL/min   Anion gap 6 5 - 15  CBC  Result Value Ref Range   WBC 6.2 4.0 - 10.5 K/uL   RBC 3.94 3.87 - 5.11 MIL/uL   Hemoglobin 11.2 (L) 12.0 - 15.0 g/dL   HCT 34.9 (L) 36.0 - 46.0 %   MCV 88.6 80.0 - 100.0 fL   MCH 28.4 26.0 - 34.0 pg   MCHC 32.1 30.0 - 36.0 g/dL   RDW 12.5 11.5 - 15.5 %   Platelets 206 150 - 400 K/uL   nRBC 0.0 0.0 - 0.2 %  CBC with Differential/Platelet  Result Value Ref Range   WBC 5.6 4.0 - 10.5 K/uL   RBC 4.04 3.87 - 5.11 MIL/uL   Hemoglobin 11.5 (L) 12.0 - 15.0 g/dL   HCT 35.5 (L) 36.0 - 46.0 %   MCV 87.9 80.0 - 100.0 fL   MCH 28.5 26.0 - 34.0 pg   MCHC 32.4 30.0 - 36.0 g/dL   RDW 12.5 11.5 - 15.5 %   Platelets 187 150 - 400 K/uL   nRBC 0.0 0.0 - 0.2 %   Neutrophils Relative % 45 %   Neutro Abs 2.5  1.7 - 7.7 K/uL   Lymphocytes Relative 41 %   Lymphs Abs 2.3 0.7 - 4.0 K/uL   Monocytes Relative 7 %   Monocytes Absolute 0.4 0.1 - 1.0 K/uL   Eosinophils Relative 6 %   Eosinophils Absolute 0.3 0.0 - 0.5 K/uL   Basophils Relative 1 %   Basophils Absolute 0.1 0.0 - 0.1 K/uL   Immature Granulocytes 0 %   Abs Immature Granulocytes 0.01 0.00 - 0.07 K/uL  Basic metabolic panel  Result Value Ref Range   Sodium 136 135 - 145 mmol/L   Potassium 3.9 3.5 - 5.1 mmol/L   Chloride 104 98 - 111 mmol/L   CO2 25 22 - 32 mmol/L   Glucose, Bld 106 (H) 70 - 99 mg/dL   BUN 6 6 - 20 mg/dL   Creatinine, Ser 0.91 0.44 - 1.00 mg/dL   Calcium 9.3 8.9 - 10.3 mg/dL   GFR calc non Af Amer >60 >60 mL/min   GFR calc Af Amer >60 >60 mL/min   Anion gap 7 5 - 15  Surgical pathology  Result Value Ref Range   SURGICAL PATHOLOGY      SURGICAL PATHOLOGY CASE: [id number] PATIENT: Tamara [name] Surgical Pathology Report     Clinical  History: Diverticulitis (cm)     FINAL MICROSCOPIC DIAGNOSIS:  A. COLON, SIGMOID, COLECTOMY: - Diverticulosis.  Foreign body-type giant cells present focally in bowel wall.   GROSS DESCRIPTION:  The specimen is received fresh and consists of a segment of colon with one open and one stapled resection margin, measuring 12.5 cm in length. Per the requisition, the stapled margin is distal.  The serosal surface is tan-pink, with attached tan-yellow adipose tissue.  The lumen is filled with a small amount of tan-yellow fecal material.  The mucosa is tan-pink with normal folding and scattered diverticula.  No mucosal lesions are grossly identified.  The wall ranges from 0.2 to 0.4 cm in thickness.  Representative sections are submitted in 4 cassettes. 1 = proximal margin. 2 = distal margin. 3-4 = diverticula.    Final Diagnosis performed by Gillie Manners,  MD.   Electronically signed 04/21/2019 Technical component performed at [facility], [place] [address], [place], [place] [address].  Professional component performed at [employer]. [facility], [place] [address], [place], [place] [address].  Immunohistochemistry Technical component (if applicable) was performed at [facility]. [address] [place], [place], [place] [address].   IMMUNOHISTOCHEMISTRY DISCLAIMER (if applicable): Some of these immunohistochemical stains may have been developed and the performance characteristics determine by [facility]. Some may not have been cleared or approved by the U.S. Food and Drug Administration. The FDA has determined that such clearance or approval is not necessary. This test is used for clinical purposes. It should not be regarded as investigational or for research. This laboratory is certified under the [place] (CLIA-88) as qualifie d to perform high complexity clinical  laboratory testing.  The controls stained appropriately.     No results found.  Discharge Exam:  Vitals:   04/22/19 2205 04/23/19 0420  BP: 117/84 124/83  Pulse: 73 88  Resp: 14 14  Temp: 98 F (36.7 C) 98 F (36.7 C)  SpO2: 100% 99%    General: WN mildly obese WF who is alert and generally healthy appearing.  Lungs: Clear to auscultation and symmetric breath sounds. Heart:  RRR. No murmur or rub. Abdomen: Soft. No hernia. Normal bowel  sounds.  Her incisions look good.  Discharge Medications:   Allergies as of 04/23/2019      Reactions   Benadryl [diphenhydramine] Anaphylaxis   Darvon [propoxyphene] Other (See Comments)   Hallucinations   Gabapentin    Unknown reaction, patient cannot remember    Ivp Dye [iodinated Diagnostic Agents] Anaphylaxis, Swelling   Levaquin [levofloxacin] Nausea And Vomiting   Patient states she tolerates Cipro   Morphine And Related Itching   Penicillins Rash   **Tolerated cefazolin 07/15/18** Has patient had a PCN reaction causing immediate rash, facial/tongue/throat swelling, SOB or lightheadedness with hypotension: yes Has patient had a PCN reaction causing severe rash involving mucus membranes or skin necrosis: yes Has patient had a PCN reaction that required hospitalization: No Has patient had a PCN reaction occurring within the last 10 years: yes If all of the above answers are "NO", then may proceed with Cephalosporin use.   Propoxyphene Hcl Other (See Comments)   hallucination   Rocephin [ceftriaxone] Rash   Welts; Pt said she tolerates keflex Tolerated cefazolin 07/15/18   Seroquel [quetiapine Fumerate]    Rectal bleeding, abdominal cramping   Statins Other (See Comments)   Severe mm pain (esp. Arms) >> DOES NOT WANT TO RETRY!   Sulfonamide Derivatives Rash   Welts   Adhesive [tape]    This is tape as well as adhesive on bandaids.   Dicyclomine    Vision loss in one eye   Fentanyl Other (See Comments)   Headaches   Other     All Anti-Histamines   Oxycodone Other (See Comments)   amnesia   Oxycontin [oxycodone Hcl]    Amnesia   Robaxin [methocarbamol]    GI upset      Medication List    STOP taking these medications   ciprofloxacin 500 MG tablet Commonly known as: CIPRO   doxycycline 100 MG capsule Commonly known as: VIBRAMYCIN   metroNIDAZOLE 500 MG tablet Commonly known as: FLAGYL     TAKE these medications   albuterol 108 (90 Base) MCG/ACT inhaler Commonly known as: VENTOLIN HFA Inhale 2 puffs into the lungs every 6 (six) hours as needed for wheezing or shortness of breath.   ALPRAZolam 1 MG 24 hr tablet Commonly known as: XANAX XR Take 2 mg by mouth at bedtime.   ALPRAZolam 1 MG tablet Commonly known as: XANAX Take 0.5-1 mg by mouth 2 (two) times daily as needed for anxiety.   aspirin 325 MG tablet Take 1 tablet (325 mg total) by mouth daily.   dexlansoprazole 60 MG capsule Commonly known as: DEXILANT Take 1 capsule (60 mg total) by mouth daily.   fenofibrate 160 MG tablet Take 160 mg by mouth daily.   losartan 100 MG tablet Commonly known as: COZAAR Take 1 tablet (100 mg total) by mouth daily.   LYSINE PO Take 1 tablet by mouth daily as needed. skin   Oxcarbazepine 300 MG tablet Commonly known as: TRILEPTAL Take 300 mg by mouth daily.   temazepam 30 MG capsule Commonly known as: RESTORIL Take 30 mg by mouth at bedtime.   temazepam 15 MG capsule Commonly known as: RESTORIL Take 15 mg by mouth at bedtime.   traMADol 50 MG tablet Commonly known as: Ultram Take 1 tablet (50 mg total) by mouth every 6 (six) hours as needed for moderate pain or severe pain.   Viibryd 10 MG Tabs Generic drug: Vilazodone HCl Take 10 mg by mouth daily.   Viibryd 20 MG Tabs Generic drug:  Vilazodone HCl Take 20 mg by mouth daily.   Vitamin D3 1.25 MG (50000 UT) Caps Take 50,000 Units by mouth once a week. What changed: when to take this       Disposition: Discharge  disposition: 01-Home or Self Care       Discharge Instructions    Diet - low sodium heart healthy   Complete by: As directed    Increase activity slowly   Complete by: As directed          Signed: Alphonsa Overall, M.D., [facility] Office:  [phone]  04/23/2019, 11:51 AM

## 2019-04-23 NOTE — Discharge Instructions (Signed)
[facility] - DISCHARGE INSTRUCTIONS TO PATIENT  Activity:  Driving - May drive in 3 or 4 days, if doing well and off pain meds   Lifting - No lifting more than 15 pounds for 2 weeks, then no limit                       Practice your Covid-19 protection:  Wear a mask, social distance, and wash your hands frequently  Wound Care:   You may shower.  You have glue on your skin that will come off over the next couple of weeks.  Diet:  As tolerated.        Drink plenty of fluid.  Follow up appointment:  Call Dr. Pollie Friar office [facility]) at [phone] for an appointment in 2 to 3 weeks..   Medications and dosages:  Resume your home medications.  You have a prescription for:  Ultram  Call Dr. Lucia Gaskins or his office  [phone]) if you have:  Temperature greater than 100.4,  Persistent nausea and vomiting,  Severe uncontrolled pain,  Redness, tenderness, or signs of infection (pain, swelling, redness, odor or green/yellow discharge around the site),  Difficulty breathing, headache or visual disturbances,  Any other questions or concerns you may have after discharge.  In an emergency, call 911 or go to an Emergency Department at a nearby hospital.

## 2019-04-23 NOTE — Care Management Important Message (Signed)
Important Message  Patient Details IM Letter given to Velva Harman RN to present to the [facility] Name: Tamara [name] MRN: [id number] Date of Birth: 05-22-1961   Medicare Important Message Given:  Yes     Kerin Salen 04/23/2019, 2:17 PM

## 2019-05-05 DIAGNOSIS — K5780 Diverticulitis of intestine, part unspecified, with perforation and abscess without bleeding: Secondary | ICD-10-CM | POA: Diagnosis not present

## 2019-06-01 ENCOUNTER — Other Ambulatory Visit: Payer: Self-pay

## 2019-06-01 DIAGNOSIS — F329 Major depressive disorder, single episode, unspecified: Secondary | ICD-10-CM | POA: Diagnosis not present

## 2019-06-01 DIAGNOSIS — Z20822 Contact with and (suspected) exposure to covid-19: Secondary | ICD-10-CM

## 2019-06-01 DIAGNOSIS — Z20828 Contact with and (suspected) exposure to other viral communicable diseases: Secondary | ICD-10-CM | POA: Diagnosis not present

## 2019-06-03 LAB — NOVEL CORONAVIRUS, NAA: SARS-CoV-2, NAA: NOT DETECTED

## 2019-06-08 DIAGNOSIS — R1031 Right lower quadrant pain: Secondary | ICD-10-CM | POA: Diagnosis not present

## 2019-06-09 ENCOUNTER — Other Ambulatory Visit: Payer: Self-pay | Admitting: Student

## 2019-06-09 ENCOUNTER — Other Ambulatory Visit (HOSPITAL_COMMUNITY): Payer: Self-pay | Admitting: Student

## 2019-06-09 DIAGNOSIS — R1031 Right lower quadrant pain: Secondary | ICD-10-CM

## 2019-06-10 DIAGNOSIS — K760 Fatty (change of) liver, not elsewhere classified: Secondary | ICD-10-CM | POA: Diagnosis not present

## 2019-06-10 DIAGNOSIS — N200 Calculus of kidney: Secondary | ICD-10-CM | POA: Diagnosis not present

## 2019-06-15 ENCOUNTER — Ambulatory Visit (HOSPITAL_COMMUNITY): Payer: Medicare Other

## 2019-06-16 ENCOUNTER — Other Ambulatory Visit: Payer: Medicare Other

## 2019-06-16 DIAGNOSIS — F4310 Post-traumatic stress disorder, unspecified: Secondary | ICD-10-CM | POA: Diagnosis not present

## 2019-06-18 DIAGNOSIS — F4310 Post-traumatic stress disorder, unspecified: Secondary | ICD-10-CM | POA: Diagnosis not present

## 2019-06-23 DIAGNOSIS — F4310 Post-traumatic stress disorder, unspecified: Secondary | ICD-10-CM | POA: Diagnosis not present

## 2019-06-30 DIAGNOSIS — F4310 Post-traumatic stress disorder, unspecified: Secondary | ICD-10-CM | POA: Diagnosis not present

## 2019-07-02 DIAGNOSIS — F4310 Post-traumatic stress disorder, unspecified: Secondary | ICD-10-CM | POA: Diagnosis not present

## 2019-07-21 DIAGNOSIS — F4310 Post-traumatic stress disorder, unspecified: Secondary | ICD-10-CM | POA: Diagnosis not present

## 2019-07-23 DIAGNOSIS — F4310 Post-traumatic stress disorder, unspecified: Secondary | ICD-10-CM | POA: Diagnosis not present

## 2019-07-28 DIAGNOSIS — F4310 Post-traumatic stress disorder, unspecified: Secondary | ICD-10-CM | POA: Diagnosis not present

## 2019-07-30 DIAGNOSIS — F4310 Post-traumatic stress disorder, unspecified: Secondary | ICD-10-CM | POA: Diagnosis not present

## 2019-08-04 DIAGNOSIS — F4310 Post-traumatic stress disorder, unspecified: Secondary | ICD-10-CM | POA: Diagnosis not present

## 2019-08-05 ENCOUNTER — Encounter (HOSPITAL_BASED_OUTPATIENT_CLINIC_OR_DEPARTMENT_OTHER): Payer: Self-pay

## 2019-08-05 ENCOUNTER — Emergency Department (HOSPITAL_BASED_OUTPATIENT_CLINIC_OR_DEPARTMENT_OTHER)
Admission: EM | Admit: 2019-08-05 | Discharge: 2019-08-05 | Disposition: A | Discharge status: Home / Self Care | Payer: Medicare Other | Attending: Emergency Medicine | Admitting: Emergency Medicine

## 2019-08-05 ENCOUNTER — Other Ambulatory Visit: Payer: Self-pay

## 2019-08-05 ENCOUNTER — Emergency Department (HOSPITAL_BASED_OUTPATIENT_CLINIC_OR_DEPARTMENT_OTHER): Payer: Medicare Other

## 2019-08-05 DIAGNOSIS — N183 Chronic kidney disease, stage 3 unspecified: Secondary | ICD-10-CM | POA: Insufficient documentation

## 2019-08-05 DIAGNOSIS — Z888 Allergy status to other drugs, medicaments and biological substances status: Secondary | ICD-10-CM | POA: Diagnosis not present

## 2019-08-05 DIAGNOSIS — Z885 Allergy status to narcotic agent status: Secondary | ICD-10-CM | POA: Insufficient documentation

## 2019-08-05 DIAGNOSIS — M9901 Segmental and somatic dysfunction of cervical region: Secondary | ICD-10-CM | POA: Diagnosis not present

## 2019-08-05 DIAGNOSIS — Z881 Allergy status to other antibiotic agents status: Secondary | ICD-10-CM | POA: Diagnosis not present

## 2019-08-05 DIAGNOSIS — R1084 Generalized abdominal pain: Secondary | ICD-10-CM | POA: Diagnosis not present

## 2019-08-05 DIAGNOSIS — R1032 Left lower quadrant pain: Secondary | ICD-10-CM | POA: Diagnosis present

## 2019-08-05 DIAGNOSIS — M5414 Radiculopathy, thoracic region: Secondary | ICD-10-CM | POA: Diagnosis not present

## 2019-08-05 DIAGNOSIS — I129 Hypertensive chronic kidney disease with stage 1 through stage 4 chronic kidney disease, or unspecified chronic kidney disease: Secondary | ICD-10-CM | POA: Insufficient documentation

## 2019-08-05 DIAGNOSIS — M9903 Segmental and somatic dysfunction of lumbar region: Secondary | ICD-10-CM | POA: Diagnosis not present

## 2019-08-05 DIAGNOSIS — M5032 Other cervical disc degeneration, mid-cervical region, unspecified level: Secondary | ICD-10-CM | POA: Diagnosis not present

## 2019-08-05 DIAGNOSIS — E782 Mixed hyperlipidemia: Secondary | ICD-10-CM | POA: Diagnosis not present

## 2019-08-05 DIAGNOSIS — M5416 Radiculopathy, lumbar region: Secondary | ICD-10-CM | POA: Diagnosis not present

## 2019-08-05 DIAGNOSIS — Z7982 Long term (current) use of aspirin: Secondary | ICD-10-CM | POA: Diagnosis not present

## 2019-08-05 DIAGNOSIS — K5730 Diverticulosis of large intestine without perforation or abscess without bleeding: Secondary | ICD-10-CM | POA: Diagnosis not present

## 2019-08-05 DIAGNOSIS — M9902 Segmental and somatic dysfunction of thoracic region: Secondary | ICD-10-CM | POA: Diagnosis not present

## 2019-08-05 DIAGNOSIS — Z20822 Contact with and (suspected) exposure to covid-19: Secondary | ICD-10-CM | POA: Diagnosis not present

## 2019-08-05 DIAGNOSIS — Z79899 Other long term (current) drug therapy: Secondary | ICD-10-CM | POA: Insufficient documentation

## 2019-08-05 DIAGNOSIS — Z882 Allergy status to sulfonamides status: Secondary | ICD-10-CM | POA: Insufficient documentation

## 2019-08-05 DIAGNOSIS — R197 Diarrhea, unspecified: Secondary | ICD-10-CM | POA: Diagnosis not present

## 2019-08-05 LAB — URINALYSIS, ROUTINE W REFLEX MICROSCOPIC
Bilirubin Urine: NEGATIVE
Glucose, UA: NEGATIVE mg/dL
Hgb urine dipstick: NEGATIVE
Ketones, ur: NEGATIVE mg/dL
Leukocytes,Ua: NEGATIVE
Nitrite: NEGATIVE
Protein, ur: NEGATIVE mg/dL
Specific Gravity, Urine: 1.02 (ref 1.005–1.030)
pH: 6 (ref 5.0–8.0)

## 2019-08-05 LAB — COMPREHENSIVE METABOLIC PANEL WITH GFR
ALT: 16 U/L (ref 0–44)
AST: 22 U/L (ref 15–41)
Albumin: 4.1 g/dL (ref 3.5–5.0)
Alkaline Phosphatase: 72 U/L (ref 38–126)
Anion gap: 7 (ref 5–15)
BUN: 19 mg/dL (ref 6–20)
CO2: 23 mmol/L (ref 22–32)
Calcium: 9.6 mg/dL (ref 8.9–10.3)
Chloride: 106 mmol/L (ref 98–111)
Creatinine, Ser: 1.07 mg/dL — ABNORMAL HIGH (ref 0.44–1.00)
GFR calc Af Amer: >60 mL/min
GFR calc non Af Amer: 58 mL/min — ABNORMAL LOW
Glucose, Bld: 132 mg/dL — ABNORMAL HIGH (ref 70–99)
Potassium: 3.5 mmol/L (ref 3.5–5.1)
Sodium: 136 mmol/L (ref 135–145)
Total Bilirubin: 0.6 mg/dL (ref 0.3–1.2)
Total Protein: 7.5 g/dL (ref 6.5–8.1)

## 2019-08-05 LAB — CBC
HCT: 41.6 % (ref 36.0–46.0)
Hemoglobin: 13.7 g/dL (ref 12.0–15.0)
MCH: 28.4 pg (ref 26.0–34.0)
MCHC: 32.9 g/dL (ref 30.0–36.0)
MCV: 86.1 fL (ref 80.0–100.0)
Platelets: 316 10*3/uL (ref 150–400)
RBC: 4.83 MIL/uL (ref 3.87–5.11)
RDW: 13.1 % (ref 11.5–15.5)
WBC: 6.7 10*3/uL (ref 4.0–10.5)
nRBC: 0 % (ref 0.0–0.2)

## 2019-08-05 LAB — LIPASE, BLOOD: Lipase: 52 U/L — ABNORMAL HIGH (ref 11–51)

## 2019-08-05 MED ORDER — CIPROFLOXACIN HCL 500 MG PO TABS
500.0000 mg | ORAL_TABLET | Freq: Once | ORAL | Status: AC
  Administered 2019-08-05: 17:00:00 500 mg via ORAL
  Filled 2019-08-05: qty 1

## 2019-08-05 MED ORDER — HYDROMORPHONE HCL 1 MG/ML IJ SOLN
0.5000 mg | Freq: Once | INTRAMUSCULAR | Status: AC
  Administered 2019-08-05: 0.5 mg via INTRAVENOUS
  Filled 2019-08-05: qty 1

## 2019-08-05 MED ORDER — METRONIDAZOLE 500 MG PO TABS: 500.0000 mg | ORAL_TABLET | Freq: Two times a day (BID) | ORAL | 0 refills | Status: DC

## 2019-08-05 MED ORDER — CIPROFLOXACIN HCL 500 MG PO TABS: 500.0000 mg | ORAL_TABLET | Freq: Two times a day (BID) | ORAL | 0 refills | Status: AC

## 2019-08-05 MED ORDER — ONDANSETRON HCL 4 MG/2ML IJ SOLN
4.0000 mg | Freq: Once | INTRAMUSCULAR | Status: AC
  Administered 2019-08-05: 15:00:00 4 mg via INTRAVENOUS
  Filled 2019-08-05: qty 2

## 2019-08-05 MED ORDER — METRONIDAZOLE 500 MG PO TABS
500.0000 mg | ORAL_TABLET | Freq: Once | ORAL | Status: AC
  Administered 2019-08-05: 17:00:00 500 mg via ORAL
  Filled 2019-08-05: qty 1

## 2019-08-05 MED ORDER — HYDROCODONE-ACETAMINOPHEN 5-325 MG PO TABS: 1.0000 | ORAL_TABLET | Freq: Four times a day (QID) | ORAL | 0 refills | Status: DC | PRN

## 2019-08-05 NOTE — ED Notes (Signed)
Pt transported to CT ?

## 2019-08-05 NOTE — ED Triage Notes (Signed)
Pt c/o left side abd pain, diarrhea x 1 episode-states she was advised via phone by surgeon office to come to ED-pt NAD-steady gait

## 2019-08-05 NOTE — ED Notes (Signed)
Verbal from  brother will come pick up pt

## 2019-08-05 NOTE — ED Provider Notes (Signed)
[facility] EMERGENCY DEPARTMENT Provider Note   CSN: [id number] Arrival date & time: 08/05/19  1335     History Chief Complaint  Patient presents with  . Abdominal Pain    Tamara [name] is a 59 y.o. female with PMH/o anemia, asthma, depression, diverticulitis, sigmoidectomy who presents for evaluation of abdominal pain that began today.  She reports that this morning, she started developing some lower left-sided abdominal pain.  She states that throughout the day, it progressively worsened.  She states that it is sharp, burning and is only on the left side.  She states she had one episode of loose stool.  No blood in stool.  She has a previous history of diverticulitis that has required a sigmoidectomy.  She called her surgeon's office who advised her to come to the emergency department for further evaluation of her symptoms.  She states she was in her normal state of health until today's symptoms.  She had not had any fevers, cough, congestion.  She denies any chest pain, difficulty breathing, dysuria, hematuria.  She has not had any nausea/vomiting.  The history is provided by the patient.       Past Medical History:  Diagnosis Date  . Allergy   . Anemia   . Anxiety   . Asthma   . Depression   . Diverticulitis   . GERD (gastroesophageal reflux disease)   . History of short term memory loss   . HTN (hypertension)   . Hyperlipemia   . Neuromuscular disorder ([place])   . PTSD (post-traumatic stress disorder)   . Shock therapy as cause of abnormal reaction of patient or of later complication without mention of misadventure at time of procedure     Patient Active Problem List   Diagnosis Date Noted  . Diverticulitis large intestine w/o perforation or abscess w/o bleeding 04/20/2019  . Abscess   . PTSD (post-traumatic stress disorder) 07/12/2018  . Diverticulitis of colon 03/15/2018  . CKD (chronic kidney disease), stage III 03/15/2018  . Sepsis ([place]) 03/14/2018    . Diverticulosis of colon without hemorrhage 11/09/2016  . Multinodular non-toxic goiter 02/16/2015  . Migraine 11/05/2013  . Stroke ([place]) 10/22/2013  . ASTHMA 08/01/2010  . Hyperlipidemia 09/17/2007  . OBESITY 09/17/2007  . Anxiety 09/17/2007  . Depression 09/17/2007  . Essential hypertension 09/17/2007  . GERD 09/17/2007    Past Surgical History:  Procedure Laterality Date  . ABDOMINAL HYSTERECTOMY     partial  . CHOLECYSTECTOMY    . COLONOSCOPY    . IR RADIOLOGIST EVAL & MGMT  07/24/2018  . LAPAROSCOPIC SIGMOID COLECTOMY N/A 04/20/2019   Procedure: LAPAROSCOPIC SIGMOID COLECTOMY;  Surgeon: Alphonsa Overall, MD;  Location: [facility] ORS;  Service: General;  Laterality: N/A;  . OTHER SURGICAL HISTORY     electric shock therapy  . TUBAL LIGATION    . UPPER GASTROINTESTINAL ENDOSCOPY    . UTERINE FIBROID SURGERY     did a c-section cut     OB History   No obstetric history on file.     Family History  Problem Relation Age of Onset  . Arrhythmia Father   . Stroke Father   . Hyperlipidemia Father   . Heart disease Father   . Hypertension Father   . COPD Father   . Coronary artery disease Maternal Grandfather 40       Died 58  . Heart disease Maternal Grandfather   . Hypertension Maternal Grandfather   . Cancer Mother  bone marrow  . Hyperlipidemia Mother   . Hypertension Mother   . Hearing loss Paternal Grandmother   . Hearing loss Paternal Grandfather   . Diabetes Paternal Grandfather   . Diabetes Daughter   . Colon cancer Paternal Uncle   . Hyperlipidemia Brother   . Hyperlipidemia Maternal Grandmother   . Esophageal cancer Neg Hx   . Rectal cancer Neg Hx   . Stomach cancer Neg Hx     Social History   Tobacco Use  . Smoking status: Never Smoker  . Smokeless tobacco: Never Used  Substance Use Topics  . Alcohol use: No    Alcohol/week: 0.0 standard drinks  . Drug use: No    Home Medications Prior to Admission medications   Medication Sig Start  Date End Date Taking? Authorizing Provider  albuterol (PROVENTIL HFA;VENTOLIN HFA) 108 (90 Base) MCG/ACT inhaler Inhale 2 puffs into the lungs every 6 (six) hours as needed for wheezing or shortness of breath. 07/05/15   Leandrew Koyanagi, MD  ALPRAZolam (XANAX XR) 1 MG 24 hr tablet Take 2 mg by mouth at bedtime.     [provider]  ALPRAZolam Duanne Moron) 1 MG tablet Take 0.5-1 mg by mouth 2 (two) times daily as needed for anxiety.    [provider]  aspirin 325 MG tablet Take 1 tablet (325 mg total) by mouth daily. 10/22/13   Bernadene Bell, MD  Cholecalciferol (VITAMIN D3) 50000 UNITS CAPS Take 50,000 Units by mouth once a week. Patient taking differently: Take 50,000 Units by mouth every Saturday.  09/24/14   [name], MD  ciprofloxacin (CIPRO) 500 MG tablet Take 1 tablet (500 mg total) by mouth every 12 (twelve) hours for 7 days. 08/05/19 08/12/19  Volanda Napoleon, PA-C  dexlansoprazole (DEXILANT) 60 MG capsule Take 1 capsule (60 mg total) by mouth daily. 09/02/14   [name], MD  fenofibrate 160 MG tablet Take 160 mg by mouth daily.    [provider]  HYDROcodone-acetaminophen (NORCO/VICODIN) 5-325 MG tablet Take 1-2 tablets by mouth every 6 (six) hours as needed. 08/05/19   Volanda Napoleon, PA-C  losartan (COZAAR) 100 MG tablet Take 1 tablet (100 mg total) by mouth daily. 09/07/15   [name], MD  LYSINE PO Take 1 tablet by mouth daily as needed. skin    [provider]  metroNIDAZOLE (FLAGYL) 500 MG tablet Take 1 tablet (500 mg total) by mouth 2 (two) times daily. 08/05/19   Volanda Napoleon, PA-C  Oxcarbazepine (TRILEPTAL) 300 MG tablet Take 300 mg by mouth daily.    [provider]  temazepam (RESTORIL) 15 MG capsule Take 15 mg by mouth at bedtime.  01/16/17   [provider]  temazepam (RESTORIL) 30 MG capsule Take 30 mg by mouth at bedtime.    [provider]  traMADol (ULTRAM) 50 MG tablet Take 1 tablet (50 mg  total) by mouth every 6 (six) hours as needed for moderate pain or severe pain. 04/23/19   Alphonsa Overall, MD  Vilazodone HCl (VIIBRYD) 10 MG TABS Take 10 mg by mouth daily.    [provider]  Vilazodone HCl (VIIBRYD) 20 MG TABS Take 20 mg by mouth daily.    [provider]    Allergies    Benadryl [diphenhydramine], Darvon [propoxyphene], Gabapentin, Ivp dye [iodinated diagnostic agents], Levaquin [levofloxacin], Morphine and related, Penicillins, Propoxyphene hcl, Rocephin [ceftriaxone], Seroquel [quetiapine fumerate], Statins, Sulfonamide derivatives, Adhesive [tape], Dicyclomine, Fentanyl, Other, Oxycodone, Oxycontin [oxycodone hcl], and  Robaxin [methocarbamol]  Review of Systems   Review of Systems  Constitutional: Negative for fever.  Respiratory: Negative for cough and shortness of breath.   Cardiovascular: Negative for chest pain.  Gastrointestinal: Positive for abdominal pain and diarrhea. Negative for blood in stool, nausea and vomiting.  Genitourinary: Negative for dysuria and hematuria.  Neurological: Negative for headaches.  All other systems reviewed and are negative.   Physical Exam Updated Vital Signs BP 122/82 (BP Location: Left Arm)   Pulse 76   Temp 99 F (37.2 C) (Oral)   Resp 17   Ht 5\' 2"  (1.575 m)   Wt 84.8 kg   SpO2 100%   BMI 34.20 kg/m   Physical Exam Vitals and nursing note reviewed.  Constitutional:      Appearance: Normal appearance. She is well-developed.     Comments: Sitting comfortably on examination table  HENT:     Head: Normocephalic and atraumatic.  Eyes:     General: Lids are normal.     Conjunctiva/sclera: Conjunctivae normal.     Pupils: Pupils are equal, round, and reactive to light.  Cardiovascular:     Rate and Rhythm: Normal rate and regular rhythm.     Pulses: Normal pulses.     Heart sounds: Normal heart sounds. No murmur. No friction rub. No gallop.   Pulmonary:     Effort: Pulmonary effort is normal.      Breath sounds: Normal breath sounds.     Comments: Lungs clear to auscultation bilaterally.  Symmetric chest rise.  No wheezing, rales, rhonchi. Abdominal:     Palpations: Abdomen is soft. Abdomen is not rigid.     Tenderness: There is abdominal tenderness in the left lower quadrant. There is left CVA tenderness. There is no right CVA tenderness or guarding.     Comments: Abdomen is soft, non-distended. Generalized tenderness noted to the left side diffusely. No focal point. No rigidity, no guarding.   Musculoskeletal:        General: Normal range of motion.     Cervical back: Full passive range of motion without pain.  Skin:    General: Skin is warm and dry.     Capillary Refill: Capillary refill takes less than 2 seconds.  Neurological:     Mental Status: She is alert and oriented to person, place, and time.  Psychiatric:        Speech: Speech normal.     ED Results / Procedures / Treatments   Labs (all labs ordered are listed, but only abnormal results are displayed) Labs Reviewed  COMPREHENSIVE METABOLIC PANEL - Abnormal; Notable for the following components:      Result Value   Glucose, Bld 132 (*)    Creatinine, Ser 1.07 (*)    GFR calc non Af Amer 58 (*)    All other components within normal limits  LIPASE, BLOOD - Abnormal; Notable for the following components:   Lipase 52 (*)    All other components within normal limits  SARS CORONAVIRUS 2 (TAT 6-24 HRS)  CBC  URINALYSIS, ROUTINE W REFLEX MICROSCOPIC    EKG None  Radiology CT ABDOMEN PELVIS WO CONTRAST  Result Date: 08/05/2019 CLINICAL DATA:  Left-sided abdominal pain, diarrhea EXAM: CT ABDOMEN AND PELVIS WITHOUT CONTRAST TECHNIQUE: Multidetector CT imaging of the abdomen and pelvis was performed following the standard protocol without IV contrast. COMPARISON:  03/13/2019 FINDINGS: Lower chest: No acute pleural or parenchymal lung disease. Hepatobiliary: No focal liver abnormality is seen. Status post  cholecystectomy. No biliary dilatation. Pancreas: Unremarkable. No pancreatic ductal dilatation or surrounding inflammatory changes. Spleen: Normal in size without focal abnormality. Adrenals/Urinary Tract: The adrenals are unremarkable. Punctate 2 mm calcification ventral aspect right kidney reference image 24 is associated with an area of cortical scarring, and may reflect cortical calcification rather than nonobstructing calculus. This is stable. There is a punctate less than 2 mm nonobstructing calculus lower pole left kidney reference image 33. Ureters and bladder are unremarkable. Stomach/Bowel: Small hiatal hernia unchanged. There is diffuse colonic diverticulosis with no evidence of diverticulitis. Normal appendix right lower quadrant. Postsurgical changes from partial sigmoid colon resection and reanastomosis. Vascular/Lymphatic: Evaluation of the abdominal vasculature is limited without IV contrast. No pathologic lymphadenopathy within the abdomen or pelvis. Reproductive: Status post partial hysterectomy.  No adnexal masses. Other: No abdominal wall hernia or abnormality. No abdominopelvic ascites. Musculoskeletal: No acute bony abnormalities. Reconstructed images demonstrate no additional findings. IMPRESSION: 1. Punctate bilateral renal calcifications, consistent with nonobstructing calculi versus cortical calcification. No change since prior exams. 2. Diverticulosis without diverticulitis. 3. Small hiatal hernia unchanged. Electronically Signed   By: Randa Ngo M.D.   On: 08/05/2019 16:32    Procedures Procedures (including critical care time)  Medications Ordered in ED Medications  ondansetron (ZOFRAN) injection 4 mg (4 mg Intravenous Given 08/05/19 1447)  HYDROmorphone (DILAUDID) injection 0.5 mg (0.5 mg Intravenous Given 08/05/19 1448)  ciprofloxacin (CIPRO) tablet 500 mg (500 mg Oral Given 08/05/19 1721)  metroNIDAZOLE (FLAGYL) tablet 500 mg (500 mg Oral Given 08/05/19 1721)    ED Course    I have reviewed the triage vital signs and the nursing notes.  Pertinent labs & imaging results that were available during my care of the patient were reviewed by me and considered in my medical decision making (see chart for details).    MDM Rules/Calculators/A&P                      59 year old female past history of diverticulitis that required sigmoidectomy who presents for evaluation of left-sided abdominal pain that began today. History of diverticulitis.  One episode of diarrhea earlier today.  Called her surgeon who advised her to come the emergency department.  On ED arrival, she is afebrile, nontoxic-appearing.  Vital signs are stable.  She has some diffuse tenderness noted to lower left abdomen.  CBC shows no leukocytosis.  CMP shows no acute abnormality. UA shows no acute abnormality.  Lipase is elevated at 52.  CT scan shows no evidence of changes around pancreas.  She has 2 nonobstructing stones, one in the right kidney, one in the left.  Small hiatal hernia is unchanged.  There is no evidence of diverticulitis hough she does have diffuse colonic diverticulosis.  Reevaluation.  Patient does report some improvement in pain after analgesics.  Repeat abdominal exam so she still has some tenderness but is improved.  She states it is about a 3/10.  She is hemodynamically stable.  At this time, I discussed her results with her.  No indication of active diverticulitis on CT scan but given her pain and history, I feel that it would be proactive to start her on antibiotics.  I discussed this plan with patient and she is agreeable.  She has tolerated Flagyl and Cipro in the past.  She is agreeable to start antibiotics and follow-up with surgery as an outpatient.  At this time, I feel this is reasonable given her overall reassuring appearance.  Additionally, we discussed about possible  Covid.  She does want be tested.  We will plan to do outpatient testing. Discussed patient with Dr. Roslynn Amble who  is agreeable to plan. At this time, patient exhibits no emergent life-threatening condition that require further evaluation in ED or admission. Patient had ample opportunity for questions and discussion. All patient's questions were answered with full understanding. Strict return precautions discussed. Patient expresses understanding and agreement to plan.   Portions of this note were generated with [employer]. Dictation errors may occur despite best attempts at proofreading.  Final Clinical Impression(s) / ED Diagnoses Final diagnoses:  Generalized abdominal pain    Rx / DC Orders ED Discharge Orders         Ordered    metroNIDAZOLE (FLAGYL) 500 MG tablet  2 times daily     08/05/19 1724    ciprofloxacin (CIPRO) 500 MG tablet  Every 12 hours     08/05/19 1724    HYDROcodone-acetaminophen (NORCO/VICODIN) 5-325 MG tablet  Every 6 hours PRN     08/05/19 1724           Volanda Napoleon, PA-C 08/05/19 2131    Lucrezia Starch, MD 08/07/19 1534

## 2019-08-05 NOTE — Discharge Instructions (Signed)
Take antibiotics as directed. Please take all of your antibiotics until finished.  Take pain medications as directed for break through pain. Do not drive or operate machinery while taking this medication.   Follow up with your surgeon as directed.   You have a COVID test pending. You need to quarantine until the results come back. You can check online regarding your results.   Return to the Emergency Dept for any worsening pain, fevers, vomiting, diarrhea or any other concerns.

## 2019-08-06 LAB — SARS CORONAVIRUS 2 (TAT 6-24 HRS): SARS Coronavirus 2: NEGATIVE

## 2019-08-11 DIAGNOSIS — M5032 Other cervical disc degeneration, mid-cervical region, unspecified level: Secondary | ICD-10-CM | POA: Diagnosis not present

## 2019-08-11 DIAGNOSIS — M5416 Radiculopathy, lumbar region: Secondary | ICD-10-CM | POA: Diagnosis not present

## 2019-08-11 DIAGNOSIS — M9901 Segmental and somatic dysfunction of cervical region: Secondary | ICD-10-CM | POA: Diagnosis not present

## 2019-08-11 DIAGNOSIS — M5414 Radiculopathy, thoracic region: Secondary | ICD-10-CM | POA: Diagnosis not present

## 2019-08-11 DIAGNOSIS — M9902 Segmental and somatic dysfunction of thoracic region: Secondary | ICD-10-CM | POA: Diagnosis not present

## 2019-08-11 DIAGNOSIS — M9903 Segmental and somatic dysfunction of lumbar region: Secondary | ICD-10-CM | POA: Diagnosis not present

## 2019-08-11 DIAGNOSIS — F4310 Post-traumatic stress disorder, unspecified: Secondary | ICD-10-CM | POA: Diagnosis not present

## 2019-08-13 DIAGNOSIS — M5032 Other cervical disc degeneration, mid-cervical region, unspecified level: Secondary | ICD-10-CM | POA: Diagnosis not present

## 2019-08-13 DIAGNOSIS — M9901 Segmental and somatic dysfunction of cervical region: Secondary | ICD-10-CM | POA: Diagnosis not present

## 2019-08-13 DIAGNOSIS — M9902 Segmental and somatic dysfunction of thoracic region: Secondary | ICD-10-CM | POA: Diagnosis not present

## 2019-08-13 DIAGNOSIS — F4310 Post-traumatic stress disorder, unspecified: Secondary | ICD-10-CM | POA: Diagnosis not present

## 2019-08-13 DIAGNOSIS — M5414 Radiculopathy, thoracic region: Secondary | ICD-10-CM | POA: Diagnosis not present

## 2019-08-13 DIAGNOSIS — M5416 Radiculopathy, lumbar region: Secondary | ICD-10-CM | POA: Diagnosis not present

## 2019-08-13 DIAGNOSIS — M9903 Segmental and somatic dysfunction of lumbar region: Secondary | ICD-10-CM | POA: Diagnosis not present

## 2019-08-14 DIAGNOSIS — R109 Unspecified abdominal pain: Secondary | ICD-10-CM | POA: Diagnosis not present

## 2019-08-18 DIAGNOSIS — F4310 Post-traumatic stress disorder, unspecified: Secondary | ICD-10-CM | POA: Diagnosis not present

## 2019-08-19 DIAGNOSIS — M9902 Segmental and somatic dysfunction of thoracic region: Secondary | ICD-10-CM | POA: Diagnosis not present

## 2019-08-19 DIAGNOSIS — M5414 Radiculopathy, thoracic region: Secondary | ICD-10-CM | POA: Diagnosis not present

## 2019-08-19 DIAGNOSIS — M9903 Segmental and somatic dysfunction of lumbar region: Secondary | ICD-10-CM | POA: Diagnosis not present

## 2019-08-19 DIAGNOSIS — M5416 Radiculopathy, lumbar region: Secondary | ICD-10-CM | POA: Diagnosis not present

## 2019-08-19 DIAGNOSIS — M5032 Other cervical disc degeneration, mid-cervical region, unspecified level: Secondary | ICD-10-CM | POA: Diagnosis not present

## 2019-08-19 DIAGNOSIS — M9901 Segmental and somatic dysfunction of cervical region: Secondary | ICD-10-CM | POA: Diagnosis not present

## 2019-08-24 DIAGNOSIS — E782 Mixed hyperlipidemia: Secondary | ICD-10-CM | POA: Diagnosis not present

## 2019-08-24 DIAGNOSIS — E7210 Disorders of sulfur-bearing amino-acid metabolism, unspecified: Secondary | ICD-10-CM | POA: Diagnosis not present

## 2019-08-24 DIAGNOSIS — E559 Vitamin D deficiency, unspecified: Secondary | ICD-10-CM | POA: Diagnosis not present

## 2019-08-24 DIAGNOSIS — R7989 Other specified abnormal findings of blood chemistry: Secondary | ICD-10-CM | POA: Diagnosis not present

## 2019-08-24 DIAGNOSIS — F329 Major depressive disorder, single episode, unspecified: Secondary | ICD-10-CM | POA: Diagnosis not present

## 2019-08-24 DIAGNOSIS — R5383 Other fatigue: Secondary | ICD-10-CM | POA: Diagnosis not present

## 2019-08-24 DIAGNOSIS — I7090 Unspecified atherosclerosis: Secondary | ICD-10-CM | POA: Diagnosis not present

## 2019-08-25 DIAGNOSIS — M5416 Radiculopathy, lumbar region: Secondary | ICD-10-CM | POA: Diagnosis not present

## 2019-08-25 DIAGNOSIS — M9902 Segmental and somatic dysfunction of thoracic region: Secondary | ICD-10-CM | POA: Diagnosis not present

## 2019-08-25 DIAGNOSIS — M5414 Radiculopathy, thoracic region: Secondary | ICD-10-CM | POA: Diagnosis not present

## 2019-08-25 DIAGNOSIS — M9901 Segmental and somatic dysfunction of cervical region: Secondary | ICD-10-CM | POA: Diagnosis not present

## 2019-08-25 DIAGNOSIS — M5032 Other cervical disc degeneration, mid-cervical region, unspecified level: Secondary | ICD-10-CM | POA: Diagnosis not present

## 2019-08-25 DIAGNOSIS — M9903 Segmental and somatic dysfunction of lumbar region: Secondary | ICD-10-CM | POA: Diagnosis not present

## 2019-08-27 DIAGNOSIS — F4310 Post-traumatic stress disorder, unspecified: Secondary | ICD-10-CM | POA: Diagnosis not present

## 2019-09-01 DIAGNOSIS — M9903 Segmental and somatic dysfunction of lumbar region: Secondary | ICD-10-CM | POA: Diagnosis not present

## 2019-09-01 DIAGNOSIS — M5414 Radiculopathy, thoracic region: Secondary | ICD-10-CM | POA: Diagnosis not present

## 2019-09-01 DIAGNOSIS — M5032 Other cervical disc degeneration, mid-cervical region, unspecified level: Secondary | ICD-10-CM | POA: Diagnosis not present

## 2019-09-01 DIAGNOSIS — M5416 Radiculopathy, lumbar region: Secondary | ICD-10-CM | POA: Diagnosis not present

## 2019-09-01 DIAGNOSIS — M9901 Segmental and somatic dysfunction of cervical region: Secondary | ICD-10-CM | POA: Diagnosis not present

## 2019-09-01 DIAGNOSIS — M9902 Segmental and somatic dysfunction of thoracic region: Secondary | ICD-10-CM | POA: Diagnosis not present

## 2019-09-02 DIAGNOSIS — K5780 Diverticulitis of intestine, part unspecified, with perforation and abscess without bleeding: Secondary | ICD-10-CM | POA: Diagnosis not present

## 2019-09-09 DIAGNOSIS — M5032 Other cervical disc degeneration, mid-cervical region, unspecified level: Secondary | ICD-10-CM | POA: Diagnosis not present

## 2019-09-09 DIAGNOSIS — M9901 Segmental and somatic dysfunction of cervical region: Secondary | ICD-10-CM | POA: Diagnosis not present

## 2019-09-09 DIAGNOSIS — M5416 Radiculopathy, lumbar region: Secondary | ICD-10-CM | POA: Diagnosis not present

## 2019-09-09 DIAGNOSIS — M9902 Segmental and somatic dysfunction of thoracic region: Secondary | ICD-10-CM | POA: Diagnosis not present

## 2019-09-09 DIAGNOSIS — M9903 Segmental and somatic dysfunction of lumbar region: Secondary | ICD-10-CM | POA: Diagnosis not present

## 2019-09-09 DIAGNOSIS — M5414 Radiculopathy, thoracic region: Secondary | ICD-10-CM | POA: Diagnosis not present

## 2019-09-15 DIAGNOSIS — M5414 Radiculopathy, thoracic region: Secondary | ICD-10-CM | POA: Diagnosis not present

## 2019-09-15 DIAGNOSIS — F4310 Post-traumatic stress disorder, unspecified: Secondary | ICD-10-CM | POA: Diagnosis not present

## 2019-09-15 DIAGNOSIS — M9901 Segmental and somatic dysfunction of cervical region: Secondary | ICD-10-CM | POA: Diagnosis not present

## 2019-09-15 DIAGNOSIS — M9902 Segmental and somatic dysfunction of thoracic region: Secondary | ICD-10-CM | POA: Diagnosis not present

## 2019-09-15 DIAGNOSIS — M5032 Other cervical disc degeneration, mid-cervical region, unspecified level: Secondary | ICD-10-CM | POA: Diagnosis not present

## 2019-09-15 DIAGNOSIS — M5416 Radiculopathy, lumbar region: Secondary | ICD-10-CM | POA: Diagnosis not present

## 2019-09-15 DIAGNOSIS — M9903 Segmental and somatic dysfunction of lumbar region: Secondary | ICD-10-CM | POA: Diagnosis not present

## 2019-09-17 DIAGNOSIS — M5414 Radiculopathy, thoracic region: Secondary | ICD-10-CM | POA: Diagnosis not present

## 2019-09-17 DIAGNOSIS — M9902 Segmental and somatic dysfunction of thoracic region: Secondary | ICD-10-CM | POA: Diagnosis not present

## 2019-09-17 DIAGNOSIS — M9901 Segmental and somatic dysfunction of cervical region: Secondary | ICD-10-CM | POA: Diagnosis not present

## 2019-09-17 DIAGNOSIS — M9903 Segmental and somatic dysfunction of lumbar region: Secondary | ICD-10-CM | POA: Diagnosis not present

## 2019-09-17 DIAGNOSIS — M5032 Other cervical disc degeneration, mid-cervical region, unspecified level: Secondary | ICD-10-CM | POA: Diagnosis not present

## 2019-09-17 DIAGNOSIS — M5416 Radiculopathy, lumbar region: Secondary | ICD-10-CM | POA: Diagnosis not present

## 2019-09-22 DIAGNOSIS — M5414 Radiculopathy, thoracic region: Secondary | ICD-10-CM | POA: Diagnosis not present

## 2019-09-22 DIAGNOSIS — F4310 Post-traumatic stress disorder, unspecified: Secondary | ICD-10-CM | POA: Diagnosis not present

## 2019-09-22 DIAGNOSIS — M9901 Segmental and somatic dysfunction of cervical region: Secondary | ICD-10-CM | POA: Diagnosis not present

## 2019-09-22 DIAGNOSIS — M9902 Segmental and somatic dysfunction of thoracic region: Secondary | ICD-10-CM | POA: Diagnosis not present

## 2019-09-22 DIAGNOSIS — M5032 Other cervical disc degeneration, mid-cervical region, unspecified level: Secondary | ICD-10-CM | POA: Diagnosis not present

## 2019-09-22 DIAGNOSIS — M5416 Radiculopathy, lumbar region: Secondary | ICD-10-CM | POA: Diagnosis not present

## 2019-09-22 DIAGNOSIS — M9903 Segmental and somatic dysfunction of lumbar region: Secondary | ICD-10-CM | POA: Diagnosis not present

## 2019-09-24 DIAGNOSIS — M9902 Segmental and somatic dysfunction of thoracic region: Secondary | ICD-10-CM | POA: Diagnosis not present

## 2019-09-24 DIAGNOSIS — M9903 Segmental and somatic dysfunction of lumbar region: Secondary | ICD-10-CM | POA: Diagnosis not present

## 2019-09-24 DIAGNOSIS — M5416 Radiculopathy, lumbar region: Secondary | ICD-10-CM | POA: Diagnosis not present

## 2019-09-24 DIAGNOSIS — M5032 Other cervical disc degeneration, mid-cervical region, unspecified level: Secondary | ICD-10-CM | POA: Diagnosis not present

## 2019-09-24 DIAGNOSIS — F4310 Post-traumatic stress disorder, unspecified: Secondary | ICD-10-CM | POA: Diagnosis not present

## 2019-09-24 DIAGNOSIS — M9901 Segmental and somatic dysfunction of cervical region: Secondary | ICD-10-CM | POA: Diagnosis not present

## 2019-09-24 DIAGNOSIS — M5414 Radiculopathy, thoracic region: Secondary | ICD-10-CM | POA: Diagnosis not present

## 2019-09-29 DIAGNOSIS — M9903 Segmental and somatic dysfunction of lumbar region: Secondary | ICD-10-CM | POA: Diagnosis not present

## 2019-09-29 DIAGNOSIS — M9901 Segmental and somatic dysfunction of cervical region: Secondary | ICD-10-CM | POA: Diagnosis not present

## 2019-09-29 DIAGNOSIS — M5414 Radiculopathy, thoracic region: Secondary | ICD-10-CM | POA: Diagnosis not present

## 2019-09-29 DIAGNOSIS — M9902 Segmental and somatic dysfunction of thoracic region: Secondary | ICD-10-CM | POA: Diagnosis not present

## 2019-09-29 DIAGNOSIS — M5416 Radiculopathy, lumbar region: Secondary | ICD-10-CM | POA: Diagnosis not present

## 2019-09-29 DIAGNOSIS — M5032 Other cervical disc degeneration, mid-cervical region, unspecified level: Secondary | ICD-10-CM | POA: Diagnosis not present

## 2019-09-29 DIAGNOSIS — F4310 Post-traumatic stress disorder, unspecified: Secondary | ICD-10-CM | POA: Diagnosis not present

## 2019-10-01 DIAGNOSIS — M5032 Other cervical disc degeneration, mid-cervical region, unspecified level: Secondary | ICD-10-CM | POA: Diagnosis not present

## 2019-10-01 DIAGNOSIS — M5414 Radiculopathy, thoracic region: Secondary | ICD-10-CM | POA: Diagnosis not present

## 2019-10-01 DIAGNOSIS — M9901 Segmental and somatic dysfunction of cervical region: Secondary | ICD-10-CM | POA: Diagnosis not present

## 2019-10-01 DIAGNOSIS — F4310 Post-traumatic stress disorder, unspecified: Secondary | ICD-10-CM | POA: Diagnosis not present

## 2019-10-01 DIAGNOSIS — M9903 Segmental and somatic dysfunction of lumbar region: Secondary | ICD-10-CM | POA: Diagnosis not present

## 2019-10-01 DIAGNOSIS — M9902 Segmental and somatic dysfunction of thoracic region: Secondary | ICD-10-CM | POA: Diagnosis not present

## 2019-10-01 DIAGNOSIS — M5416 Radiculopathy, lumbar region: Secondary | ICD-10-CM | POA: Diagnosis not present

## 2019-10-05 DIAGNOSIS — M9903 Segmental and somatic dysfunction of lumbar region: Secondary | ICD-10-CM | POA: Diagnosis not present

## 2019-10-05 DIAGNOSIS — M5416 Radiculopathy, lumbar region: Secondary | ICD-10-CM | POA: Diagnosis not present

## 2019-10-05 DIAGNOSIS — M9902 Segmental and somatic dysfunction of thoracic region: Secondary | ICD-10-CM | POA: Diagnosis not present

## 2019-10-05 DIAGNOSIS — M5032 Other cervical disc degeneration, mid-cervical region, unspecified level: Secondary | ICD-10-CM | POA: Diagnosis not present

## 2019-10-05 DIAGNOSIS — M9901 Segmental and somatic dysfunction of cervical region: Secondary | ICD-10-CM | POA: Diagnosis not present

## 2019-10-05 DIAGNOSIS — M5414 Radiculopathy, thoracic region: Secondary | ICD-10-CM | POA: Diagnosis not present

## 2019-10-06 DIAGNOSIS — F4310 Post-traumatic stress disorder, unspecified: Secondary | ICD-10-CM | POA: Diagnosis not present

## 2019-10-07 DIAGNOSIS — M542 Cervicalgia: Secondary | ICD-10-CM | POA: Diagnosis not present

## 2019-10-07 DIAGNOSIS — R293 Abnormal posture: Secondary | ICD-10-CM | POA: Diagnosis not present

## 2019-10-07 DIAGNOSIS — M545 Low back pain: Secondary | ICD-10-CM | POA: Diagnosis not present

## 2019-10-08 DIAGNOSIS — F4310 Post-traumatic stress disorder, unspecified: Secondary | ICD-10-CM | POA: Diagnosis not present

## 2019-10-12 DIAGNOSIS — K5780 Diverticulitis of intestine, part unspecified, with perforation and abscess without bleeding: Secondary | ICD-10-CM | POA: Diagnosis not present

## 2019-10-13 DIAGNOSIS — F4310 Post-traumatic stress disorder, unspecified: Secondary | ICD-10-CM | POA: Diagnosis not present

## 2019-10-14 DIAGNOSIS — M545 Low back pain: Secondary | ICD-10-CM | POA: Diagnosis not present

## 2019-10-14 DIAGNOSIS — R293 Abnormal posture: Secondary | ICD-10-CM | POA: Diagnosis not present

## 2019-10-14 DIAGNOSIS — M542 Cervicalgia: Secondary | ICD-10-CM | POA: Diagnosis not present

## 2019-10-15 DIAGNOSIS — F4310 Post-traumatic stress disorder, unspecified: Secondary | ICD-10-CM | POA: Diagnosis not present

## 2019-10-16 DIAGNOSIS — M542 Cervicalgia: Secondary | ICD-10-CM | POA: Diagnosis not present

## 2019-10-16 DIAGNOSIS — M545 Low back pain: Secondary | ICD-10-CM | POA: Diagnosis not present

## 2019-10-16 DIAGNOSIS — R293 Abnormal posture: Secondary | ICD-10-CM | POA: Diagnosis not present

## 2019-10-21 DIAGNOSIS — M542 Cervicalgia: Secondary | ICD-10-CM | POA: Diagnosis not present

## 2019-10-21 DIAGNOSIS — M545 Low back pain: Secondary | ICD-10-CM | POA: Diagnosis not present

## 2019-10-21 DIAGNOSIS — R293 Abnormal posture: Secondary | ICD-10-CM | POA: Diagnosis not present

## 2019-10-22 DIAGNOSIS — F4310 Post-traumatic stress disorder, unspecified: Secondary | ICD-10-CM | POA: Diagnosis not present

## 2019-10-23 DIAGNOSIS — M545 Low back pain: Secondary | ICD-10-CM | POA: Diagnosis not present

## 2019-10-23 DIAGNOSIS — R293 Abnormal posture: Secondary | ICD-10-CM | POA: Diagnosis not present

## 2019-10-23 DIAGNOSIS — M542 Cervicalgia: Secondary | ICD-10-CM | POA: Diagnosis not present

## 2019-10-30 DIAGNOSIS — R293 Abnormal posture: Secondary | ICD-10-CM | POA: Diagnosis not present

## 2019-10-30 DIAGNOSIS — M545 Low back pain: Secondary | ICD-10-CM | POA: Diagnosis not present

## 2019-10-30 DIAGNOSIS — M542 Cervicalgia: Secondary | ICD-10-CM | POA: Diagnosis not present

## 2019-11-02 DIAGNOSIS — M542 Cervicalgia: Secondary | ICD-10-CM | POA: Diagnosis not present

## 2019-11-02 DIAGNOSIS — M545 Low back pain: Secondary | ICD-10-CM | POA: Diagnosis not present

## 2019-11-02 DIAGNOSIS — R293 Abnormal posture: Secondary | ICD-10-CM | POA: Diagnosis not present

## 2019-11-03 DIAGNOSIS — F4310 Post-traumatic stress disorder, unspecified: Secondary | ICD-10-CM | POA: Diagnosis not present

## 2019-11-05 DIAGNOSIS — F4310 Post-traumatic stress disorder, unspecified: Secondary | ICD-10-CM | POA: Diagnosis not present

## 2019-11-06 DIAGNOSIS — M545 Low back pain: Secondary | ICD-10-CM | POA: Diagnosis not present

## 2019-11-06 DIAGNOSIS — M542 Cervicalgia: Secondary | ICD-10-CM | POA: Diagnosis not present

## 2019-11-06 DIAGNOSIS — R293 Abnormal posture: Secondary | ICD-10-CM | POA: Diagnosis not present

## 2019-11-09 DIAGNOSIS — R293 Abnormal posture: Secondary | ICD-10-CM | POA: Diagnosis not present

## 2019-11-09 DIAGNOSIS — M545 Low back pain: Secondary | ICD-10-CM | POA: Diagnosis not present

## 2019-11-09 DIAGNOSIS — M542 Cervicalgia: Secondary | ICD-10-CM | POA: Diagnosis not present

## 2019-11-10 DIAGNOSIS — F4310 Post-traumatic stress disorder, unspecified: Secondary | ICD-10-CM | POA: Diagnosis not present

## 2019-11-11 DIAGNOSIS — M545 Low back pain: Secondary | ICD-10-CM | POA: Diagnosis not present

## 2019-11-11 DIAGNOSIS — R293 Abnormal posture: Secondary | ICD-10-CM | POA: Diagnosis not present

## 2019-11-11 DIAGNOSIS — M542 Cervicalgia: Secondary | ICD-10-CM | POA: Diagnosis not present

## 2019-11-12 DIAGNOSIS — E7800 Pure hypercholesterolemia, unspecified: Secondary | ICD-10-CM | POA: Diagnosis not present

## 2019-11-16 DIAGNOSIS — M545 Low back pain: Secondary | ICD-10-CM | POA: Diagnosis not present

## 2019-11-16 DIAGNOSIS — M542 Cervicalgia: Secondary | ICD-10-CM | POA: Diagnosis not present

## 2019-11-16 DIAGNOSIS — R293 Abnormal posture: Secondary | ICD-10-CM | POA: Diagnosis not present

## 2019-11-17 DIAGNOSIS — F4310 Post-traumatic stress disorder, unspecified: Secondary | ICD-10-CM | POA: Diagnosis not present

## 2019-11-18 DIAGNOSIS — M542 Cervicalgia: Secondary | ICD-10-CM | POA: Diagnosis not present

## 2019-11-18 DIAGNOSIS — R293 Abnormal posture: Secondary | ICD-10-CM | POA: Diagnosis not present

## 2019-11-18 DIAGNOSIS — M545 Low back pain: Secondary | ICD-10-CM | POA: Diagnosis not present

## 2019-11-23 DIAGNOSIS — R293 Abnormal posture: Secondary | ICD-10-CM | POA: Diagnosis not present

## 2019-11-23 DIAGNOSIS — F329 Major depressive disorder, single episode, unspecified: Secondary | ICD-10-CM | POA: Diagnosis not present

## 2019-11-23 DIAGNOSIS — M545 Low back pain: Secondary | ICD-10-CM | POA: Diagnosis not present

## 2019-11-23 DIAGNOSIS — M542 Cervicalgia: Secondary | ICD-10-CM | POA: Diagnosis not present

## 2019-11-24 DIAGNOSIS — F4310 Post-traumatic stress disorder, unspecified: Secondary | ICD-10-CM | POA: Diagnosis not present

## 2019-11-25 DIAGNOSIS — K5780 Diverticulitis of intestine, part unspecified, with perforation and abscess without bleeding: Secondary | ICD-10-CM | POA: Diagnosis not present

## 2019-11-26 DIAGNOSIS — F4310 Post-traumatic stress disorder, unspecified: Secondary | ICD-10-CM | POA: Diagnosis not present

## 2019-11-27 DIAGNOSIS — M545 Low back pain: Secondary | ICD-10-CM | POA: Diagnosis not present

## 2019-11-27 DIAGNOSIS — M542 Cervicalgia: Secondary | ICD-10-CM | POA: Diagnosis not present

## 2019-11-27 DIAGNOSIS — R293 Abnormal posture: Secondary | ICD-10-CM | POA: Diagnosis not present

## 2019-12-02 DIAGNOSIS — R293 Abnormal posture: Secondary | ICD-10-CM | POA: Diagnosis not present

## 2019-12-02 DIAGNOSIS — M545 Low back pain: Secondary | ICD-10-CM | POA: Diagnosis not present

## 2019-12-02 DIAGNOSIS — M542 Cervicalgia: Secondary | ICD-10-CM | POA: Diagnosis not present

## 2019-12-03 DIAGNOSIS — F4310 Post-traumatic stress disorder, unspecified: Secondary | ICD-10-CM | POA: Diagnosis not present

## 2019-12-07 DIAGNOSIS — M542 Cervicalgia: Secondary | ICD-10-CM | POA: Diagnosis not present

## 2019-12-07 DIAGNOSIS — R293 Abnormal posture: Secondary | ICD-10-CM | POA: Diagnosis not present

## 2019-12-07 DIAGNOSIS — M545 Low back pain: Secondary | ICD-10-CM | POA: Diagnosis not present

## 2019-12-08 DIAGNOSIS — F4310 Post-traumatic stress disorder, unspecified: Secondary | ICD-10-CM | POA: Diagnosis not present

## 2019-12-10 DIAGNOSIS — F4310 Post-traumatic stress disorder, unspecified: Secondary | ICD-10-CM | POA: Diagnosis not present

## 2019-12-11 DIAGNOSIS — M542 Cervicalgia: Secondary | ICD-10-CM | POA: Diagnosis not present

## 2019-12-11 DIAGNOSIS — M545 Low back pain: Secondary | ICD-10-CM | POA: Diagnosis not present

## 2019-12-11 DIAGNOSIS — R293 Abnormal posture: Secondary | ICD-10-CM | POA: Diagnosis not present

## 2019-12-14 DIAGNOSIS — R05 Cough: Secondary | ICD-10-CM | POA: Diagnosis not present

## 2019-12-14 DIAGNOSIS — N183 Chronic kidney disease, stage 3 unspecified: Secondary | ICD-10-CM | POA: Diagnosis not present

## 2019-12-14 DIAGNOSIS — I119 Hypertensive heart disease without heart failure: Secondary | ICD-10-CM | POA: Diagnosis not present

## 2019-12-16 ENCOUNTER — Ambulatory Visit: Payer: PRIVATE HEALTH INSURANCE | Attending: Internal Medicine

## 2019-12-16 DIAGNOSIS — Z20822 Contact with and (suspected) exposure to covid-19: Secondary | ICD-10-CM | POA: Diagnosis not present

## 2019-12-17 LAB — NOVEL CORONAVIRUS, NAA: SARS-CoV-2, NAA: NOT DETECTED

## 2019-12-17 LAB — SARS-COV-2, NAA 2 DAY TAT

## 2019-12-28 DIAGNOSIS — M542 Cervicalgia: Secondary | ICD-10-CM | POA: Diagnosis not present

## 2019-12-28 DIAGNOSIS — R293 Abnormal posture: Secondary | ICD-10-CM | POA: Diagnosis not present

## 2019-12-28 DIAGNOSIS — M545 Low back pain: Secondary | ICD-10-CM | POA: Diagnosis not present

## 2019-12-31 DIAGNOSIS — F4310 Post-traumatic stress disorder, unspecified: Secondary | ICD-10-CM | POA: Diagnosis not present

## 2020-01-05 DIAGNOSIS — F4310 Post-traumatic stress disorder, unspecified: Secondary | ICD-10-CM | POA: Diagnosis not present

## 2020-01-05 DIAGNOSIS — K5780 Diverticulitis of intestine, part unspecified, with perforation and abscess without bleeding: Secondary | ICD-10-CM | POA: Diagnosis not present

## 2020-01-25 DIAGNOSIS — M5414 Radiculopathy, thoracic region: Secondary | ICD-10-CM | POA: Diagnosis not present

## 2020-01-25 DIAGNOSIS — M5416 Radiculopathy, lumbar region: Secondary | ICD-10-CM | POA: Diagnosis not present

## 2020-01-25 DIAGNOSIS — M9902 Segmental and somatic dysfunction of thoracic region: Secondary | ICD-10-CM | POA: Diagnosis not present

## 2020-01-25 DIAGNOSIS — M9901 Segmental and somatic dysfunction of cervical region: Secondary | ICD-10-CM | POA: Diagnosis not present

## 2020-01-25 DIAGNOSIS — M9903 Segmental and somatic dysfunction of lumbar region: Secondary | ICD-10-CM | POA: Diagnosis not present

## 2020-01-25 DIAGNOSIS — M5032 Other cervical disc degeneration, mid-cervical region, unspecified level: Secondary | ICD-10-CM | POA: Diagnosis not present

## 2020-01-26 DIAGNOSIS — F4310 Post-traumatic stress disorder, unspecified: Secondary | ICD-10-CM | POA: Diagnosis not present

## 2020-02-02 DIAGNOSIS — F4310 Post-traumatic stress disorder, unspecified: Secondary | ICD-10-CM | POA: Diagnosis not present

## 2020-02-04 DIAGNOSIS — F4310 Post-traumatic stress disorder, unspecified: Secondary | ICD-10-CM | POA: Diagnosis not present

## 2020-02-09 DIAGNOSIS — F4310 Post-traumatic stress disorder, unspecified: Secondary | ICD-10-CM | POA: Diagnosis not present

## 2020-02-16 DIAGNOSIS — F4310 Post-traumatic stress disorder, unspecified: Secondary | ICD-10-CM | POA: Diagnosis not present

## 2020-02-18 DIAGNOSIS — F4310 Post-traumatic stress disorder, unspecified: Secondary | ICD-10-CM | POA: Diagnosis not present

## 2020-02-23 DIAGNOSIS — F4310 Post-traumatic stress disorder, unspecified: Secondary | ICD-10-CM | POA: Diagnosis not present

## 2020-03-01 DIAGNOSIS — F4310 Post-traumatic stress disorder, unspecified: Secondary | ICD-10-CM | POA: Diagnosis not present

## 2020-03-03 DIAGNOSIS — F4310 Post-traumatic stress disorder, unspecified: Secondary | ICD-10-CM | POA: Diagnosis not present

## 2020-03-08 DIAGNOSIS — F4310 Post-traumatic stress disorder, unspecified: Secondary | ICD-10-CM | POA: Diagnosis not present

## 2020-03-10 DIAGNOSIS — F4310 Post-traumatic stress disorder, unspecified: Secondary | ICD-10-CM | POA: Diagnosis not present

## 2020-04-12 DIAGNOSIS — F4310 Post-traumatic stress disorder, unspecified: Secondary | ICD-10-CM | POA: Diagnosis not present

## 2020-04-13 DIAGNOSIS — K5780 Diverticulitis of intestine, part unspecified, with perforation and abscess without bleeding: Secondary | ICD-10-CM | POA: Diagnosis not present

## 2020-04-14 DIAGNOSIS — F4310 Post-traumatic stress disorder, unspecified: Secondary | ICD-10-CM | POA: Diagnosis not present

## 2020-04-19 DIAGNOSIS — F4310 Post-traumatic stress disorder, unspecified: Secondary | ICD-10-CM | POA: Diagnosis not present

## 2020-04-26 DIAGNOSIS — F4310 Post-traumatic stress disorder, unspecified: Secondary | ICD-10-CM | POA: Diagnosis not present

## 2020-04-27 ENCOUNTER — Encounter: Payer: Self-pay | Admitting: Cardiovascular Disease

## 2020-04-27 ENCOUNTER — Other Ambulatory Visit: Payer: Self-pay

## 2020-04-27 ENCOUNTER — Ambulatory Visit (INDEPENDENT_AMBULATORY_CARE_PROVIDER_SITE_OTHER): Payer: Medicare Other | Admitting: Cardiovascular Disease

## 2020-04-27 VITALS — BP 118/92 | HR 97 | Ht 62.0 in | Wt 195.0 lb

## 2020-04-27 DIAGNOSIS — E782 Mixed hyperlipidemia: Secondary | ICD-10-CM | POA: Diagnosis not present

## 2020-04-27 DIAGNOSIS — E669 Obesity, unspecified: Secondary | ICD-10-CM

## 2020-04-27 DIAGNOSIS — I10 Essential (primary) hypertension: Secondary | ICD-10-CM

## 2020-04-27 MED ORDER — EZETIMIBE 10 MG PO TABS: 10.0000 mg | ORAL_TABLET | Freq: Every day | ORAL | 3 refills | Status: DC

## 2020-04-27 NOTE — Patient Instructions (Signed)
Medication Instructions:  Start Zetia 10 mg daily Continue all other medications   Lab Work: None ordered   Testing/Procedures: None ordered   Follow-Up: At [facility], you and your health needs are our priority.  As part of our continuing mission to provide you with exceptional heart care, we have created designated Provider Care Teams.  These Care Teams include your primary Cardiologist (physician) and Advanced Practice Providers (APPs -  Physician Assistants and Nurse Practitioners) who all work together to provide you with the care you need, when you need it.  We recommend signing up for the patient portal called "MyChart".  Sign up information is provided on this After Visit Summary.  MyChart is used to connect with patients for Virtual Visits (Telemedicine).  Patients are able to view lab/test results, encounter notes, upcoming appointments, etc.  Non-urgent messages can be sent to your provider as well.   To learn more about what you can do with MyChart, go to [email or web address].    Your next appointment:  Friday 07/29/20 at 11:20 am   The format for your next appointment: Office   Provider:  Dr.O'Neal

## 2020-04-27 NOTE — Progress Notes (Signed)
Cardiology Office Note:   Date:  04/27/2020  NAME:  Tamara [name]    MRN: [id number] DOB:  08-Feb-1961   PCP:  Jilda Panda, MD  Cardiologist:  No primary care provider on file.   Referring MD: Jilda Panda, MD   Chief Complaint  Patient presents with  . Hypertension   History of Present Illness:   Tamara [name] is a 59 y.o. female with a hx of hypertension, hyperlipidemia who is being seen today for the evaluation of elevated blood pressure.  She was last seen in our office for atypical chest pain in 2018.  She underwent a Lexiscan nuclear medicine stress test that was normal. She reports for the last several weeks her blood pressure has been elevated.  Her father was Mr. Snyder.  I took care of him in the hospital.  He recently died of congestive heart failure and COPD.  She reports that she was in [place] for the funeral and follow-up arrangements.  Apparently she was quite stressed at the time.  Blood pressures were in the 170s.  She apparently had many issues with other family members.  She also reports has had pain in her left shoulder.  Pain can occur at any time.  Often stress related.  We did discuss that this is likely just related to the fact that her father died and she is under a lot of stress.  Her EKG today demonstrates normal sinus rhythm with nonspecific ST-T changes.  She describes no exertional chest pain or pressure.  She describes no shortness of breath.  Her most recent lipid profile shows a total cholesterol 244, HDL 47, LDL 164, triglycerides 166.  A1c was 5.3.  She is intolerant of statins.  She is on fenofibrate.  We did discuss Zetia.  She is interested.  I did discuss with her that everything is likely stress related.  She does need to destress and allow some time between the passing of her father and any aggressive work-up for any symptoms she has.  Overall she is quite stressed and will work on stress reduction strategies.  Past Medical History: Past Medical  History:  Diagnosis Date  . Allergy   . Anemia   . Anxiety   . Asthma   . Depression   . Diverticulitis   . GERD (gastroesophageal reflux disease)   . History of short term memory loss   . HTN (hypertension)   . Hyperlipemia   . Neuromuscular disorder ([place])   . PTSD (post-traumatic stress disorder)   . Shock therapy as cause of abnormal reaction of patient or of later complication without mention of misadventure at time of procedure     Past Surgical History: Past Surgical History:  Procedure Laterality Date  . ABDOMINAL HYSTERECTOMY     partial  . CHOLECYSTECTOMY    . COLECTOMY    . COLONOSCOPY    . IR RADIOLOGIST EVAL & MGMT  07/24/2018  . LAPAROSCOPIC SIGMOID COLECTOMY N/A 04/20/2019   Procedure: LAPAROSCOPIC SIGMOID COLECTOMY;  Surgeon: Alphonsa Overall, MD;  Location: [facility];  Service: General;  Laterality: N/A;  . OTHER SURGICAL HISTORY     electric shock therapy  . TUBAL LIGATION    . UPPER GASTROINTESTINAL ENDOSCOPY    . UTERINE FIBROID SURGERY     did a c-section cut    Current Medications: Current Meds  Medication Sig  . albuterol (PROVENTIL HFA;VENTOLIN HFA) 108 (90 Base) MCG/ACT inhaler Inhale 2 puffs into the lungs every 6 (six) hours  as needed for wheezing or shortness of breath.  . ALPRAZolam (XANAX XR) 1 MG 24 hr tablet Take 2 mg by mouth at bedtime.   . ALPRAZolam (XANAX) 1 MG tablet Take 0.5-1 mg by mouth 2 (two) times daily as needed for anxiety.  Marland Kitchen aspirin 325 MG tablet Take 1 tablet (325 mg total) by mouth daily.  . Cholecalciferol (VITAMIN D3) 50000 UNITS CAPS Take 50,000 Units by mouth once a week. (Patient taking differently: Take 50,000 Units by mouth every Saturday. )  . dexlansoprazole (DEXILANT) 60 MG capsule Take 1 capsule (60 mg total) by mouth daily.  . fenofibrate 160 MG tablet Take 160 mg by mouth daily.  Marland Kitchen HYDROcodone-acetaminophen (NORCO/VICODIN) 5-325 MG tablet Take 1-2 tablets by mouth every 6 (six) hours as needed.  Marland Kitchen losartan (COZAAR)  100 MG tablet Take 1 tablet (100 mg total) by mouth daily.  Marland Kitchen LYSINE PO Take 1 tablet by mouth daily as needed. skin  . metroNIDAZOLE (FLAGYL) 500 MG tablet Take 1 tablet (500 mg total) by mouth 2 (two) times daily.  . Oxcarbazepine (TRILEPTAL) 300 MG tablet Take 300 mg by mouth daily.  . temazepam (RESTORIL) 15 MG capsule Take 15 mg by mouth at bedtime.   . temazepam (RESTORIL) 30 MG capsule Take 30 mg by mouth at bedtime.  . traMADol (ULTRAM) 50 MG tablet Take 1 tablet (50 mg total) by mouth every 6 (six) hours as needed for moderate pain or severe pain.  . Vilazodone HCl (VIIBRYD) 10 MG TABS Take 10 mg by mouth daily.  . Vilazodone HCl (VIIBRYD) 20 MG TABS Take 20 mg by mouth daily.     Allergies:    Benadryl [diphenhydramine], Darvon [propoxyphene], Gabapentin, Ivp dye [iodinated diagnostic agents], Levaquin [levofloxacin], Morphine and related, Penicillins, Propoxyphene hcl, Rocephin [ceftriaxone], Seroquel [quetiapine fumerate], Statins, Sulfonamide derivatives, Adhesive [tape], Dicyclomine, Fentanyl, Other, Oxycodone, Oxycontin [oxycodone hcl], and Robaxin [methocarbamol]   Social History: Social History   Socioeconomic History  . Marital status: Divorced    Spouse name: Not on file  . Number of children: 3  . Years of education: college  . Highest education level: Not on file  Occupational History    Employer: UNEMPLOYED    Comment: Disabled  Tobacco Use  . Smoking status: Never Smoker  . Smokeless tobacco: Never Used  Vaping Use  . Vaping Use: Never used  Substance and Sexual Activity  . Alcohol use: No    Alcohol/week: 0.0 standard drinks  . Drug use: No  . Sexual activity: Not on file  Other Topics Concern  . Not on file  Social History Narrative   Lives with boyfriend. Walks daily for 20 minutes. Education: [employer].   Social Determinants of Health   Financial Resource Strain:   . Difficulty of Paying Living Expenses: Not on file  Food Insecurity:   . Worried  About Charity fundraiser in the Last Year: Not on file  . Ran Out of Food in the Last Year: Not on file  Transportation Needs:   . Lack of Transportation (Medical): Not on file  . Lack of Transportation (Non-Medical): Not on file  Physical Activity:   . Days of Exercise per Week: Not on file  . Minutes of Exercise per Session: Not on file  Stress:   . Feeling of Stress : Not on file  Social Connections:   . Frequency of Communication with Friends and Family: Not on file  . Frequency of Social Gatherings with Friends and Family: Not  on file  . Attends Religious Services: Not on file  . Active Member of Clubs or Organizations: Not on file  . Attends Archivist Meetings: Not on file  . Marital Status: Not on file     Family History: The patient's family history includes Arrhythmia in her father; COPD in her father; Cancer in her mother; Colon cancer in her paternal uncle; Coronary artery disease (age of onset: 5) in her maternal grandfather; Diabetes in her daughter and paternal grandfather; Hearing loss in her paternal grandfather and paternal grandmother; Heart disease in her father and maternal grandfather; Heart failure in her father; Hyperlipidemia in her brother, father, maternal grandmother, and mother; Hypertension in her father, maternal grandfather, and mother; Stroke in her father. There is no history of Esophageal cancer, Rectal cancer, or Stomach cancer.  ROS:   All other ROS reviewed and negative. Pertinent positives noted in the HPI.     EKGs/Labs/Other Studies Reviewed:   The following studies were personally reviewed by me today:  EKG:  EKG is ordered today.  The ekg ordered today demonstrates normal sinus rhythm, heart rate 97, no acute ischemic changes, no evidence for infarction, and was personally reviewed by me.   Recent Labs: 08/05/2019: ALT 16; BUN 19; Creatinine, Ser 1.07; Hemoglobin 13.7; Platelets 316; Potassium 3.5; Sodium 136   Recent Lipid Panel     Component Value Date/Time   CHOL 244 (H) 02/22/2017 1247   TRIG 166 (H) 02/22/2017 1247   HDL 47 02/22/2017 1247   CHOLHDL 5.2 (H) 02/22/2017 1247   CHOLHDL 4.0 10/05/2015 1540   VLDL 35 (H) 10/05/2015 1540   LDLCALC 164 (H) 02/22/2017 1247   LDLDIRECT 94.2 01/12/2010 0000    Physical Exam:   VS:  BP (!) 118/92 (BP Location: Left Arm, Patient Position: Sitting, Cuff Size: Normal)   Pulse 97   Ht 5\' 2"  (1.575 m)   Wt 195 lb (88.5 kg)   BMI 35.67 kg/m    Wt Readings from Last 3 Encounters:  04/27/20 195 lb (88.5 kg)  08/05/19 187 lb (84.8 kg)  04/23/19 185 lb 6.5 oz (84.1 kg)    General: Well nourished, well developed, in no acute distress Heart: Atraumatic, normal size  Eyes: PEERLA, EOMI  Neck: Supple, no JVD Endocrine: No thryomegaly Cardiac: Normal S1, S2; RRR; no murmurs, rubs, or gallops Lungs: Clear to auscultation bilaterally, no wheezing, rhonchi or rales  Abd: Soft, nontender, no hepatomegaly  Ext: No edema, pulses 2+ Musculoskeletal: No deformities, BUE and BLE strength normal and equal Skin: Warm and dry, no rashes   Neuro: Alert and oriented to person, place, time, and situation, CNII-XII grossly intact, no focal deficits  Psych: Normal mood and affect   ASSESSMENT:   Tamara [name] is a 59 y.o. female who presents for the following: 1. Primary hypertension   2. Mixed hyperlipidemia   3. Obesity (BMI 30-39.9)     PLAN:   1. Primary hypertension -Elevated BPs related to the death of her father and stress.  Blood pressure today is well controlled at 118/92.  I would recommend she continue her losartan 100 mg daily.  She can keep a log of her blood pressure.  I suspect this will get better with stress reduction strategies. -She has atypical chest pain.  Again this is in the setting of recent loss of her father.  I recommended blood pressure control.  Her symptoms could also be related to accelerated hypertension from stress.  2. Mixed  hyperlipidemia -Elevated LDL cholesterol.  Would benefit from more than fenofibrate.  I recommended Zetia 10 mg daily.  She will look into starting this.  She will have blood work done by her primary care physician in the next few weeks.  We will then see her back in 3 months to reassess.  3. Obesity (BMI 30-39.9) -Weight loss and exercise advised.  Disposition: Return in about 3 months (around 07/28/2020).  Medication Adjustments/Labs and Tests Ordered: Current medicines are reviewed at length with the patient today.  Concerns regarding medicines are outlined above.  Orders Placed This Encounter  Procedures  . EKG 12-Lead   Meds ordered this encounter  Medications  . ezetimibe (ZETIA) 10 MG tablet    Sig: Take 1 tablet (10 mg total) by mouth daily.    Dispense:  90 tablet    Refill:  3    Patient Instructions  Medication Instructions:  Start Zetia 10 mg daily Continue all other medications   Lab Work: None ordered   Testing/Procedures: None ordered   Follow-Up: At [facility], you and your health needs are our priority.  As part of our continuing mission to provide you with exceptional heart care, we have created designated Provider Care Teams.  These Care Teams include your primary Cardiologist (physician) and Advanced Practice Providers (APPs -  Physician Assistants and Nurse Practitioners) who all work together to provide you with the care you need, when you need it.  We recommend signing up for the patient portal called "MyChart".  Sign up information is provided on this After Visit Summary.  MyChart is used to connect with patients for Virtual Visits (Telemedicine).  Patients are able to view lab/test results, encounter notes, upcoming appointments, etc.  Non-urgent messages can be sent to your provider as well.   To learn more about what you can do with MyChart, go to [email or web address].    Your next appointment:  Friday 07/29/20 at 11:20 am   The format for  your next appointment: Office   Provider:  Dr.O'Neal      Signed, [place] T. Audie Box, [place]  [address] [place], [place] [address] [phone]  04/27/2020 5:06 PM

## 2020-04-28 DIAGNOSIS — F4310 Post-traumatic stress disorder, unspecified: Secondary | ICD-10-CM | POA: Diagnosis not present

## 2020-05-03 DIAGNOSIS — F4310 Post-traumatic stress disorder, unspecified: Secondary | ICD-10-CM | POA: Diagnosis not present

## 2020-05-05 DIAGNOSIS — F329 Major depressive disorder, single episode, unspecified: Secondary | ICD-10-CM | POA: Diagnosis not present

## 2020-05-17 DIAGNOSIS — F4310 Post-traumatic stress disorder, unspecified: Secondary | ICD-10-CM | POA: Diagnosis not present

## 2020-05-19 DIAGNOSIS — F4310 Post-traumatic stress disorder, unspecified: Secondary | ICD-10-CM | POA: Diagnosis not present

## 2020-05-20 DIAGNOSIS — F4310 Post-traumatic stress disorder, unspecified: Secondary | ICD-10-CM | POA: Diagnosis not present

## 2020-05-31 DIAGNOSIS — F4310 Post-traumatic stress disorder, unspecified: Secondary | ICD-10-CM | POA: Diagnosis not present

## 2020-06-01 ENCOUNTER — Other Ambulatory Visit: Payer: Self-pay

## 2020-06-01 ENCOUNTER — Telehealth: Payer: Self-pay | Admitting: Internal Medicine

## 2020-06-01 DIAGNOSIS — R1031 Right lower quadrant pain: Secondary | ICD-10-CM

## 2020-06-01 NOTE — Telephone Encounter (Signed)
Pt is requesting a call back from a nurse to discuss her diverticulitis symptoms, pt would like to know what she can do.

## 2020-06-01 NOTE — Telephone Encounter (Signed)
We don't know that this is diverticulitis, and she is less likely to have developed that after prior surgery. Sounds more like muscular or soft tissue injury from what she described.  I recommend a CT scan abdomen and pelvis with oral and IV contrast this week.  Dr. Henrene Pastor will also return to the office tomorrow and can give further advice

## 2020-06-01 NOTE — Telephone Encounter (Signed)
Pt aware, scheduled for CT of A/P wo contrast due to allergy at [facility] 06/03/20@1 :30pm. Pt to arrive there at 1:15pm, be NPO after 9:30am, drink bottle 1 of contrast at 11:30am, bottle 2 at 12:30pm. Pt aware of appt.

## 2020-06-01 NOTE — Telephone Encounter (Signed)
Noted.  Agree with plans for CT.  We can arrange for follow-up with advanced practitioner thereafter, depending upon CT results.  Thanks.

## 2020-06-01 NOTE — Telephone Encounter (Signed)
Tamara [name] pt with significant history of diverticulitis calling. Last Oct she had a sigmoid colectomy with Dr. Lucia Gaskins. On Thanksgiving day she his her Left side where the surgery was with a rocking chair handle. This area is tender now. Last night she started having RLQ abd pain. Pt states she knows this is diverticulitis pain. She called [facility] and per pt she was told Dr. Windle Guard does not feel that she needs a CT scan. Feels she may have some internal bruising. Pt asked about Cipro and Flagyl and states [facility] instructed her to call her GI doctor for the meds. Pt is requesting Cipro and Flagyl as she is sure this is diverticulitis pain, pt states she has had pain on the right side like this in the past. Dr. Loletha Carrow as DOD please advise.

## 2020-06-01 NOTE — Telephone Encounter (Signed)
JP and Vaughan Basta,  Please check up on the CT scan report 12/3.  It was most likely ordered under my name as DOD today, so the report will come to my inbox.  But I will be out of the office 12/3 and not checking my inbox that day.  - HD

## 2020-06-01 NOTE — Telephone Encounter (Signed)
Thanks for the heads up.  Vaughan Basta keep me posted.

## 2020-06-02 DIAGNOSIS — F4310 Post-traumatic stress disorder, unspecified: Secondary | ICD-10-CM | POA: Diagnosis not present

## 2020-06-03 ENCOUNTER — Other Ambulatory Visit: Payer: Self-pay

## 2020-06-03 ENCOUNTER — Ambulatory Visit (HOSPITAL_BASED_OUTPATIENT_CLINIC_OR_DEPARTMENT_OTHER)
Admission: RE | Admit: 2020-06-03 | Discharge: 2020-06-03 | Disposition: A | Discharge status: Home / Self Care | Payer: Medicare Other | Source: Ambulatory Visit | Attending: Gastroenterology | Admitting: Gastroenterology

## 2020-06-03 DIAGNOSIS — R1031 Right lower quadrant pain: Secondary | ICD-10-CM | POA: Diagnosis not present

## 2020-06-03 DIAGNOSIS — K760 Fatty (change of) liver, not elsewhere classified: Secondary | ICD-10-CM | POA: Diagnosis not present

## 2020-06-03 DIAGNOSIS — N200 Calculus of kidney: Secondary | ICD-10-CM | POA: Diagnosis not present

## 2020-06-03 DIAGNOSIS — I700 Atherosclerosis of aorta: Secondary | ICD-10-CM | POA: Diagnosis not present

## 2020-06-03 DIAGNOSIS — K449 Diaphragmatic hernia without obstruction or gangrene: Secondary | ICD-10-CM | POA: Diagnosis not present

## 2020-06-03 NOTE — Telephone Encounter (Signed)
CT exam has ended but no results yet.

## 2020-06-05 NOTE — Telephone Encounter (Signed)
Vaughan Basta, Please let the patient know that her CT looks fine. No diverticulitis. See how she's feeling. Thanks  JP

## 2020-06-06 NOTE — Telephone Encounter (Signed)
Attempted to call pt but received message stating voicemail box if full and cannot leave a message.

## 2020-06-08 NOTE — Telephone Encounter (Signed)
Spoke with pt and she is aware of results. States she is feeling better.

## 2020-06-09 DIAGNOSIS — F4310 Post-traumatic stress disorder, unspecified: Secondary | ICD-10-CM | POA: Diagnosis not present

## 2020-06-10 DIAGNOSIS — E559 Vitamin D deficiency, unspecified: Secondary | ICD-10-CM | POA: Diagnosis not present

## 2020-06-10 DIAGNOSIS — R7989 Other specified abnormal findings of blood chemistry: Secondary | ICD-10-CM | POA: Diagnosis not present

## 2020-06-10 DIAGNOSIS — I7090 Unspecified atherosclerosis: Secondary | ICD-10-CM | POA: Diagnosis not present

## 2020-06-10 DIAGNOSIS — R5383 Other fatigue: Secondary | ICD-10-CM | POA: Diagnosis not present

## 2020-06-10 DIAGNOSIS — E782 Mixed hyperlipidemia: Secondary | ICD-10-CM | POA: Diagnosis not present

## 2020-06-10 DIAGNOSIS — E7210 Disorders of sulfur-bearing amino-acid metabolism, unspecified: Secondary | ICD-10-CM | POA: Diagnosis not present

## 2020-06-10 DIAGNOSIS — K5150 Left sided colitis without complications: Secondary | ICD-10-CM | POA: Diagnosis not present

## 2020-06-14 DIAGNOSIS — F4310 Post-traumatic stress disorder, unspecified: Secondary | ICD-10-CM | POA: Diagnosis not present

## 2020-06-14 DIAGNOSIS — K5780 Diverticulitis of intestine, part unspecified, with perforation and abscess without bleeding: Secondary | ICD-10-CM | POA: Diagnosis not present

## 2020-06-20 DIAGNOSIS — F329 Major depressive disorder, single episode, unspecified: Secondary | ICD-10-CM | POA: Diagnosis not present

## 2020-06-23 DIAGNOSIS — F4310 Post-traumatic stress disorder, unspecified: Secondary | ICD-10-CM | POA: Diagnosis not present

## 2020-06-28 DIAGNOSIS — F4310 Post-traumatic stress disorder, unspecified: Secondary | ICD-10-CM | POA: Diagnosis not present

## 2020-07-02 HISTORY — PX: CATARACT EXTRACTION: SUR2

## 2020-07-14 DIAGNOSIS — F4310 Post-traumatic stress disorder, unspecified: Secondary | ICD-10-CM | POA: Diagnosis not present

## 2020-07-20 DIAGNOSIS — K5780 Diverticulitis of intestine, part unspecified, with perforation and abscess without bleeding: Secondary | ICD-10-CM | POA: Diagnosis not present

## 2020-07-21 DIAGNOSIS — F4310 Post-traumatic stress disorder, unspecified: Secondary | ICD-10-CM | POA: Diagnosis not present

## 2020-07-26 DIAGNOSIS — F4310 Post-traumatic stress disorder, unspecified: Secondary | ICD-10-CM | POA: Diagnosis not present

## 2020-07-28 DIAGNOSIS — M5415 Radiculopathy, thoracolumbar region: Secondary | ICD-10-CM | POA: Diagnosis not present

## 2020-07-28 DIAGNOSIS — M5032 Other cervical disc degeneration, mid-cervical region, unspecified level: Secondary | ICD-10-CM | POA: Diagnosis not present

## 2020-07-28 DIAGNOSIS — M9902 Segmental and somatic dysfunction of thoracic region: Secondary | ICD-10-CM | POA: Diagnosis not present

## 2020-07-28 DIAGNOSIS — M9901 Segmental and somatic dysfunction of cervical region: Secondary | ICD-10-CM | POA: Diagnosis not present

## 2020-07-28 DIAGNOSIS — M9903 Segmental and somatic dysfunction of lumbar region: Secondary | ICD-10-CM | POA: Diagnosis not present

## 2020-07-28 DIAGNOSIS — M5417 Radiculopathy, lumbosacral region: Secondary | ICD-10-CM | POA: Diagnosis not present

## 2020-07-28 DIAGNOSIS — F4310 Post-traumatic stress disorder, unspecified: Secondary | ICD-10-CM | POA: Diagnosis not present

## 2020-07-29 ENCOUNTER — Ambulatory Visit: Payer: Medicare Other | Admitting: Cardiovascular Disease

## 2020-08-02 DIAGNOSIS — M9902 Segmental and somatic dysfunction of thoracic region: Secondary | ICD-10-CM | POA: Diagnosis not present

## 2020-08-02 DIAGNOSIS — M5032 Other cervical disc degeneration, mid-cervical region, unspecified level: Secondary | ICD-10-CM | POA: Diagnosis not present

## 2020-08-02 DIAGNOSIS — M9901 Segmental and somatic dysfunction of cervical region: Secondary | ICD-10-CM | POA: Diagnosis not present

## 2020-08-02 DIAGNOSIS — M9903 Segmental and somatic dysfunction of lumbar region: Secondary | ICD-10-CM | POA: Diagnosis not present

## 2020-08-02 DIAGNOSIS — M5417 Radiculopathy, lumbosacral region: Secondary | ICD-10-CM | POA: Diagnosis not present

## 2020-08-02 DIAGNOSIS — M5415 Radiculopathy, thoracolumbar region: Secondary | ICD-10-CM | POA: Diagnosis not present

## 2020-08-03 DIAGNOSIS — M9901 Segmental and somatic dysfunction of cervical region: Secondary | ICD-10-CM | POA: Diagnosis not present

## 2020-08-03 DIAGNOSIS — M5415 Radiculopathy, thoracolumbar region: Secondary | ICD-10-CM | POA: Diagnosis not present

## 2020-08-03 DIAGNOSIS — M5417 Radiculopathy, lumbosacral region: Secondary | ICD-10-CM | POA: Diagnosis not present

## 2020-08-03 DIAGNOSIS — M9903 Segmental and somatic dysfunction of lumbar region: Secondary | ICD-10-CM | POA: Diagnosis not present

## 2020-08-03 DIAGNOSIS — M9902 Segmental and somatic dysfunction of thoracic region: Secondary | ICD-10-CM | POA: Diagnosis not present

## 2020-08-03 DIAGNOSIS — M5032 Other cervical disc degeneration, mid-cervical region, unspecified level: Secondary | ICD-10-CM | POA: Diagnosis not present

## 2020-08-05 ENCOUNTER — Encounter: Payer: Self-pay | Admitting: Family Medicine

## 2020-08-05 ENCOUNTER — Other Ambulatory Visit: Payer: Self-pay

## 2020-08-05 ENCOUNTER — Ambulatory Visit (INDEPENDENT_AMBULATORY_CARE_PROVIDER_SITE_OTHER): Payer: Medicare Other | Admitting: Family Medicine

## 2020-08-05 VITALS — BP 95/66 | HR 92 | Ht 62.0 in | Wt 199.4 lb

## 2020-08-05 DIAGNOSIS — K219 Gastro-esophageal reflux disease without esophagitis: Secondary | ICD-10-CM | POA: Diagnosis not present

## 2020-08-05 DIAGNOSIS — R944 Abnormal results of kidney function studies: Secondary | ICD-10-CM

## 2020-08-05 DIAGNOSIS — M5417 Radiculopathy, lumbosacral region: Secondary | ICD-10-CM | POA: Diagnosis not present

## 2020-08-05 DIAGNOSIS — M5415 Radiculopathy, thoracolumbar region: Secondary | ICD-10-CM | POA: Diagnosis not present

## 2020-08-05 DIAGNOSIS — Z7689 Persons encountering health services in other specified circumstances: Secondary | ICD-10-CM | POA: Diagnosis not present

## 2020-08-05 DIAGNOSIS — E042 Nontoxic multinodular goiter: Secondary | ICD-10-CM

## 2020-08-05 DIAGNOSIS — F4310 Post-traumatic stress disorder, unspecified: Secondary | ICD-10-CM | POA: Diagnosis not present

## 2020-08-05 DIAGNOSIS — I10 Essential (primary) hypertension: Secondary | ICD-10-CM

## 2020-08-05 DIAGNOSIS — M9901 Segmental and somatic dysfunction of cervical region: Secondary | ICD-10-CM | POA: Diagnosis not present

## 2020-08-05 DIAGNOSIS — N183 Chronic kidney disease, stage 3 unspecified: Secondary | ICD-10-CM | POA: Diagnosis not present

## 2020-08-05 DIAGNOSIS — R7989 Other specified abnormal findings of blood chemistry: Secondary | ICD-10-CM | POA: Diagnosis not present

## 2020-08-05 DIAGNOSIS — I119 Hypertensive heart disease without heart failure: Secondary | ICD-10-CM | POA: Diagnosis not present

## 2020-08-05 DIAGNOSIS — M9903 Segmental and somatic dysfunction of lumbar region: Secondary | ICD-10-CM | POA: Diagnosis not present

## 2020-08-05 DIAGNOSIS — F32A Depression, unspecified: Secondary | ICD-10-CM

## 2020-08-05 DIAGNOSIS — G43909 Migraine, unspecified, not intractable, without status migrainosus: Secondary | ICD-10-CM

## 2020-08-05 DIAGNOSIS — E785 Hyperlipidemia, unspecified: Secondary | ICD-10-CM

## 2020-08-05 DIAGNOSIS — M9902 Segmental and somatic dysfunction of thoracic region: Secondary | ICD-10-CM | POA: Diagnosis not present

## 2020-08-05 DIAGNOSIS — M5032 Other cervical disc degeneration, mid-cervical region, unspecified level: Secondary | ICD-10-CM | POA: Diagnosis not present

## 2020-08-05 MED ORDER — LOSARTAN POTASSIUM 100 MG PO TABS: 100.0000 mg | ORAL_TABLET | Freq: Every day | ORAL | 3 refills | Status: AC

## 2020-08-05 MED ORDER — EZETIMIBE 10 MG PO TABS: 10.0000 mg | ORAL_TABLET | Freq: Every day | ORAL | 3 refills | Status: DC

## 2020-08-05 NOTE — Progress Notes (Signed)
Office Visit Note   Patient: Tamara [name]           Date of Birth: Apr 04, 1961           MRN: [id number] Visit Date: 08/05/2020 Requested by: Jilda Panda, MD [id number] [place],  [place] [address] PCP: Eunice Blase, MD  Subjective: Chief Complaint  Patient presents with  . Other    Establish primary care    HPI: She is here to establish care.  Her previous PCP was not supportive of alternative medicine.  She has been a patient of Dr. Jaynie Collins for years, and now Dr. Jaynie Collins has moved to [place].  She still sees her virtually pretty regularly and recently had some labs drawn.  She has a history of hyperlipidemia without documented heart disease.  She had a stress test which was normal.  She has been intolerant of statins.  Currently she is taking Zetia.  She was on fenofibrate as well, but her recent labs showed elevated creatinine of 1.27 so the pharmacist told her to stop it.  She needs to have that rechecked today.  Her recent lipid profile showed total cholesterol of 193, HDL of 41, triglycerides of 267 and LDL of 114.  Hypertension has been well controlled.  She is tapering off proton pump inhibitors for GERD.  She has chronic anxiety and has been on benzodiazepines but is planning to go to a facility in [place] where they will taper her off these.  She is looking forward to being off this medication.  She has chronic migraines as well as a history of asthma, diverticular disease, and a goiter.  Recent labs were also notable for elevated homocystine level of 31.3.  She had low levels of natural killer cells as well.  Cortisol level was normal although in the past she has had salivary cortisol that was abnormal.                ROS:   All other systems were reviewed and are negative.  Objective: Vital Signs: BP 95/66   Pulse 92   Ht 5\' 2"  (1.575 m)   Wt 199 lb 6.4 oz (90.4 kg)   BMI 36.47 kg/m   Physical Exam:  General:  Alert and oriented, in no acute  distress. Pulm:  Breathing unlabored. Psy:  Normal mood, congruent affect.  We spent the visit discussing her health, did not do physical exam today.    Imaging: No results found.  Assessment & Plan: 1.  Recently abnormal creatinine, possibilities include fenofibrate or proton pump inhibitor as potential causes. -Recheck levels today.  She is weaning off proton pump inhibitors as previously mentioned.  2.  Hypertension, controlled. -Refilled medication.  3.  Elevated homocysteine level - Consider checking for MTHFR gene mutation.  4.  Hyperlipidemia -Ordered a CT calcium score.     Procedures: No procedures performed        PMFS History: Patient Active Problem List   Diagnosis Date Noted  . Elevated homocysteine 08/05/2020  . Diverticulitis large intestine w/o perforation or abscess w/o bleeding 04/20/2019  . Abscess   . PTSD (post-traumatic stress disorder) 07/12/2018  . Diverticulitis of colon 03/15/2018  . CKD (chronic kidney disease), stage III ([place]) 03/15/2018  . Sepsis ([place]) 03/14/2018  . Diverticulosis of colon without hemorrhage 11/09/2016  . Multinodular non-toxic goiter 02/16/2015  . Migraine 11/05/2013  . ASTHMA 08/01/2010  . Hyperlipidemia 09/17/2007  . OBESITY 09/17/2007  . Anxiety 09/17/2007  . Depression 09/17/2007  .  Essential hypertension 09/17/2007  . GERD 09/17/2007   Past Medical History:  Diagnosis Date  . Allergy   . Anemia   . Anxiety   . Asthma   . Depression   . Diverticulitis   . GERD (gastroesophageal reflux disease)   . History of short term memory loss   . HTN (hypertension)   . Hyperlipemia   . Neuromuscular disorder ([place])   . PTSD (post-traumatic stress disorder)   . Shock therapy as cause of abnormal reaction of patient or of later complication without mention of misadventure at time of procedure     Family History  Problem Relation Age of Onset  . Arrhythmia Father   . Stroke Father   . Hyperlipidemia Father    . Heart disease Father   . Hypertension Father   . COPD Father   . Heart failure Father   . Coronary artery disease Maternal Grandfather 40       Died 58  . Heart disease Maternal Grandfather   . Hypertension Maternal Grandfather   . Cancer Mother        bone marrow  . Hyperlipidemia Mother   . Hypertension Mother   . Hearing loss Paternal Grandmother   . Hearing loss Paternal Grandfather   . Diabetes Paternal Grandfather   . Diabetes Daughter   . Colon cancer Paternal Uncle   . Hyperlipidemia Brother   . Hyperlipidemia Maternal Grandmother   . Esophageal cancer Neg Hx   . Rectal cancer Neg Hx   . Stomach cancer Neg Hx     Past Surgical History:  Procedure Laterality Date  . ABDOMINAL HYSTERECTOMY     partial  . CHOLECYSTECTOMY    . COLECTOMY    . COLONOSCOPY    . IR RADIOLOGIST EVAL & MGMT  07/24/2018  . LAPAROSCOPIC SIGMOID COLECTOMY N/A 04/20/2019   Procedure: LAPAROSCOPIC SIGMOID COLECTOMY;  Surgeon: Alphonsa Overall, MD;  Location: [facility];  Service: General;  Laterality: N/A;  . OTHER SURGICAL HISTORY     electric shock therapy  . TUBAL LIGATION    . UPPER GASTROINTESTINAL ENDOSCOPY    . UTERINE FIBROID SURGERY     did a c-section cut   Social History   Occupational History    Employer: UNEMPLOYED    Comment: Disabled  Tobacco Use  . Smoking status: Never Smoker  . Smokeless tobacco: Never Used  Vaping Use  . Vaping Use: Never used  Substance and Sexual Activity  . Alcohol use: No    Alcohol/week: 0.0 standard drinks  . Drug use: No  . Sexual activity: Not on file

## 2020-08-06 LAB — BASIC METABOLIC PANEL
BUN: 18 mg/dL (ref 7–25)
CO2: 23 mmol/L (ref 20–32)
Calcium: 10.5 mg/dL — ABNORMAL HIGH (ref 8.6–10.4)
Chloride: 105 mmol/L (ref 98–110)
Creat: 1.02 mg/dL (ref 0.50–1.05)
Glucose, Bld: 106 mg/dL — ABNORMAL HIGH (ref 65–99)
Potassium: 4 mmol/L (ref 3.5–5.3)
Sodium: 138 mmol/L (ref 135–146)

## 2020-08-08 ENCOUNTER — Telehealth: Payer: Self-pay | Admitting: Family Medicine

## 2020-08-08 DIAGNOSIS — M5032 Other cervical disc degeneration, mid-cervical region, unspecified level: Secondary | ICD-10-CM | POA: Diagnosis not present

## 2020-08-08 DIAGNOSIS — M5417 Radiculopathy, lumbosacral region: Secondary | ICD-10-CM | POA: Diagnosis not present

## 2020-08-08 DIAGNOSIS — M9903 Segmental and somatic dysfunction of lumbar region: Secondary | ICD-10-CM | POA: Diagnosis not present

## 2020-08-08 DIAGNOSIS — M9901 Segmental and somatic dysfunction of cervical region: Secondary | ICD-10-CM | POA: Diagnosis not present

## 2020-08-08 DIAGNOSIS — M5415 Radiculopathy, thoracolumbar region: Secondary | ICD-10-CM | POA: Diagnosis not present

## 2020-08-08 DIAGNOSIS — M9902 Segmental and somatic dysfunction of thoracic region: Secondary | ICD-10-CM | POA: Diagnosis not present

## 2020-08-08 NOTE — Telephone Encounter (Signed)
Creatinine looks good today at 1.02.  Glucose is in prediabetes range, but I don't believe you were fasting for this.  Calcium slightly up.  Recheck in 3-4 months.

## 2020-08-09 DIAGNOSIS — M9901 Segmental and somatic dysfunction of cervical region: Secondary | ICD-10-CM | POA: Diagnosis not present

## 2020-08-09 DIAGNOSIS — M5032 Other cervical disc degeneration, mid-cervical region, unspecified level: Secondary | ICD-10-CM | POA: Diagnosis not present

## 2020-08-09 DIAGNOSIS — M9902 Segmental and somatic dysfunction of thoracic region: Secondary | ICD-10-CM | POA: Diagnosis not present

## 2020-08-09 DIAGNOSIS — M5415 Radiculopathy, thoracolumbar region: Secondary | ICD-10-CM | POA: Diagnosis not present

## 2020-08-09 DIAGNOSIS — M9903 Segmental and somatic dysfunction of lumbar region: Secondary | ICD-10-CM | POA: Diagnosis not present

## 2020-08-09 DIAGNOSIS — F4310 Post-traumatic stress disorder, unspecified: Secondary | ICD-10-CM | POA: Diagnosis not present

## 2020-08-09 DIAGNOSIS — M5417 Radiculopathy, lumbosacral region: Secondary | ICD-10-CM | POA: Diagnosis not present

## 2020-08-10 NOTE — Progress Notes (Deleted)
Cardiology Office Note:   Date:  08/10/2020  NAME:  Tamara [name]    MRN: [id number] DOB:  12/12/1960   PCP:  Eunice Blase, MD  Cardiologist:  No primary care provider on file.  Electrophysiologist:  None   Referring MD: Jilda Panda, MD   No chief complaint on file. ***  History of Present Illness:   Tamara [name] is a 60 y.o. female with a hx of HTN, HLD, obesity who presents for follow-up. Started on zetia 10 mg daily for HLD. Coronary calcium ordered by PCP.   Problem List 1. HTN 2. HLD -T chol 244, HDL 47, LDL 164, TG 166 3. Obesity  Past Medical History: Past Medical History:  Diagnosis Date  . Allergy   . Anemia   . Anxiety   . Asthma   . Depression   . Diverticulitis   . GERD (gastroesophageal reflux disease)   . History of short term memory loss   . HTN (hypertension)   . Hyperlipemia   . PTSD (post-traumatic stress disorder)   . Shock therapy as cause of abnormal reaction of patient or of later complication without mention of misadventure at time of procedure     Past Surgical History: Past Surgical History:  Procedure Laterality Date  . ABDOMINAL HYSTERECTOMY     partial  . CHOLECYSTECTOMY    . COLECTOMY    . COLONOSCOPY    . IR RADIOLOGIST EVAL & MGMT  07/24/2018  . LAPAROSCOPIC SIGMOID COLECTOMY N/A 04/20/2019   Procedure: LAPAROSCOPIC SIGMOID COLECTOMY;  Surgeon: Alphonsa Overall, MD;  Location: [facility];  Service: General;  Laterality: N/A;  . OTHER SURGICAL HISTORY     electric shock therapy  . TUBAL LIGATION    . UPPER GASTROINTESTINAL ENDOSCOPY    . UTERINE FIBROID SURGERY     did a c-section cut    Current Medications: No outpatient medications have been marked as taking for the 08/11/20 encounter (Appointment) with Geralynn Rile, MD.     Allergies:    Benadryl [diphenhydramine], Darvon [propoxyphene], Gabapentin, Ivp dye [iodinated diagnostic agents], Levaquin [levofloxacin], Morphine and related, Penicillins, Propoxyphene  hcl, Rocephin [ceftriaxone], Seroquel [quetiapine fumerate], Statins, Sulfonamide derivatives, Adhesive [tape], Dicyclomine, Fentanyl, Other, Oxycodone, Oxycontin [oxycodone hcl], and Robaxin [methocarbamol]   Social History: Social History   Socioeconomic History  . Marital status: Divorced    Spouse name: Not on file  . Number of children: 3  . Years of education: college  . Highest education level: Not on file  Occupational History    Employer: UNEMPLOYED    Comment: Disabled  Tobacco Use  . Smoking status: Never Smoker  . Smokeless tobacco: Never Used  Vaping Use  . Vaping Use: Never used  Substance and Sexual Activity  . Alcohol use: No    Alcohol/week: 0.0 standard drinks  . Drug use: No  . Sexual activity: Not on file  Other Topics Concern  . Not on file  Social History Narrative   Lives with boyfriend. Walks daily for 20 minutes. Education: [employer].   Social Determinants of Health   Financial Resource Strain: Not on file  Food Insecurity: Not on file  Transportation Needs: Not on file  Physical Activity: Not on file  Stress: Not on file  Social Connections: Not on file     Family History: The patient's ***family history includes Arrhythmia in her father; COPD in her father; Cancer in her mother; Colon cancer in her paternal uncle; Coronary artery disease (age of onset: 74)  in her maternal grandfather; Diabetes in her daughter and paternal grandfather; Hearing loss in her paternal grandfather and paternal grandmother; Heart disease in her father and maternal grandfather; Heart failure in her father; Hyperlipidemia in her brother, father, maternal grandmother, and mother; Hypertension in her father, maternal grandfather, and mother; Stroke in her father. There is no history of Esophageal cancer, Rectal cancer, or Stomach cancer.  ROS:   All other ROS reviewed and negative. Pertinent positives noted in the HPI.     EKGs/Labs/Other Studies Reviewed:   The following  studies were personally reviewed by me today:  EKG:  EKG is *** ordered today.  The ekg ordered today demonstrates ***, and was personally reviewed by me.   NM Stress 02/27/2017  Nuclear stress EF: 81%. The left ventricular ejection fraction is hyperdynamic (>65%).  The study is normal. No evidence of ischemia. No evidence of previous MI  This is a low risk study.    Recent Labs: 08/05/2020: BUN 18; Creat 1.02; Potassium 4.0; Sodium 138   Recent Lipid Panel    Component Value Date/Time   CHOL 244 (H) 02/22/2017 1247   TRIG 166 (H) 02/22/2017 1247   HDL 47 02/22/2017 1247   CHOLHDL 5.2 (H) 02/22/2017 1247   CHOLHDL 4.0 10/05/2015 1540   VLDL 35 (H) 10/05/2015 1540   LDLCALC 164 (H) 02/22/2017 1247   LDLDIRECT 94.2 01/12/2010 0000    Physical Exam:   VS:  There were no vitals taken for this visit.   Wt Readings from Last 3 Encounters:  08/05/20 199 lb 6.4 oz (90.4 kg)  04/27/20 195 lb (88.5 kg)  08/05/19 187 lb (84.8 kg)    General: Well nourished, well developed, in no acute distress Head: Atraumatic, normal size  Eyes: PEERLA, EOMI  Neck: Supple, no JVD Endocrine: No thryomegaly Cardiac: Normal S1, S2; RRR; no murmurs, rubs, or gallops Lungs: Clear to auscultation bilaterally, no wheezing, rhonchi or rales  Abd: Soft, nontender, no hepatomegaly  Ext: No edema, pulses 2+ Musculoskeletal: No deformities, BUE and BLE strength normal and equal Skin: Warm and dry, no rashes   Neuro: Alert and oriented to person, place, time, and situation, CNII-XII grossly intact, no focal deficits  Psych: Normal mood and affect   ASSESSMENT:   Tamara [name] is a 60 y.o. female who presents for the following: No diagnosis found.  PLAN:   There are no diagnoses linked to this encounter.  Disposition: No follow-ups on file.  Medication Adjustments/Labs and Tests Ordered: Current medicines are reviewed at length with the patient today.  Concerns regarding medicines are outlined  above.  No orders of the defined types were placed in this encounter.  No orders of the defined types were placed in this encounter.   There are no Patient Instructions on file for this visit.   Time Spent with Patient: I have spent a total of *** minutes with patient reviewing hospital notes, telemetry, EKGs, labs and examining the patient as well as establishing an assessment and plan that was discussed with the patient.  > 50% of time was spent in direct patient care.  Signed, Addison Naegeli. Audie Box, [place]  [address] [place], [place] [address] [phone]  08/10/2020 8:32 PM

## 2020-08-11 ENCOUNTER — Ambulatory Visit: Payer: Medicare Other | Admitting: Cardiovascular Disease

## 2020-08-11 DIAGNOSIS — F4310 Post-traumatic stress disorder, unspecified: Secondary | ICD-10-CM | POA: Diagnosis not present

## 2020-08-11 DIAGNOSIS — I10 Essential (primary) hypertension: Secondary | ICD-10-CM

## 2020-08-11 DIAGNOSIS — M5415 Radiculopathy, thoracolumbar region: Secondary | ICD-10-CM | POA: Diagnosis not present

## 2020-08-11 DIAGNOSIS — M9901 Segmental and somatic dysfunction of cervical region: Secondary | ICD-10-CM | POA: Diagnosis not present

## 2020-08-11 DIAGNOSIS — M5032 Other cervical disc degeneration, mid-cervical region, unspecified level: Secondary | ICD-10-CM | POA: Diagnosis not present

## 2020-08-11 DIAGNOSIS — M9902 Segmental and somatic dysfunction of thoracic region: Secondary | ICD-10-CM | POA: Diagnosis not present

## 2020-08-11 DIAGNOSIS — M5417 Radiculopathy, lumbosacral region: Secondary | ICD-10-CM | POA: Diagnosis not present

## 2020-08-11 DIAGNOSIS — E782 Mixed hyperlipidemia: Secondary | ICD-10-CM

## 2020-08-11 DIAGNOSIS — E669 Obesity, unspecified: Secondary | ICD-10-CM

## 2020-08-11 DIAGNOSIS — M9903 Segmental and somatic dysfunction of lumbar region: Secondary | ICD-10-CM | POA: Diagnosis not present

## 2020-08-15 DIAGNOSIS — M5417 Radiculopathy, lumbosacral region: Secondary | ICD-10-CM | POA: Diagnosis not present

## 2020-08-15 DIAGNOSIS — M9902 Segmental and somatic dysfunction of thoracic region: Secondary | ICD-10-CM | POA: Diagnosis not present

## 2020-08-15 DIAGNOSIS — F329 Major depressive disorder, single episode, unspecified: Secondary | ICD-10-CM | POA: Diagnosis not present

## 2020-08-15 DIAGNOSIS — M5032 Other cervical disc degeneration, mid-cervical region, unspecified level: Secondary | ICD-10-CM | POA: Diagnosis not present

## 2020-08-15 DIAGNOSIS — M9903 Segmental and somatic dysfunction of lumbar region: Secondary | ICD-10-CM | POA: Diagnosis not present

## 2020-08-15 DIAGNOSIS — M9901 Segmental and somatic dysfunction of cervical region: Secondary | ICD-10-CM | POA: Diagnosis not present

## 2020-08-15 DIAGNOSIS — M5415 Radiculopathy, thoracolumbar region: Secondary | ICD-10-CM | POA: Diagnosis not present

## 2020-08-17 DIAGNOSIS — M9901 Segmental and somatic dysfunction of cervical region: Secondary | ICD-10-CM | POA: Diagnosis not present

## 2020-08-17 DIAGNOSIS — M9903 Segmental and somatic dysfunction of lumbar region: Secondary | ICD-10-CM | POA: Diagnosis not present

## 2020-08-17 DIAGNOSIS — M5032 Other cervical disc degeneration, mid-cervical region, unspecified level: Secondary | ICD-10-CM | POA: Diagnosis not present

## 2020-08-17 DIAGNOSIS — M9902 Segmental and somatic dysfunction of thoracic region: Secondary | ICD-10-CM | POA: Diagnosis not present

## 2020-08-17 DIAGNOSIS — M5415 Radiculopathy, thoracolumbar region: Secondary | ICD-10-CM | POA: Diagnosis not present

## 2020-08-17 DIAGNOSIS — M5417 Radiculopathy, lumbosacral region: Secondary | ICD-10-CM | POA: Diagnosis not present

## 2020-08-18 DIAGNOSIS — F4310 Post-traumatic stress disorder, unspecified: Secondary | ICD-10-CM | POA: Diagnosis not present

## 2020-08-20 DIAGNOSIS — J029 Acute pharyngitis, unspecified: Secondary | ICD-10-CM | POA: Diagnosis not present

## 2020-08-20 DIAGNOSIS — K5780 Diverticulitis of intestine, part unspecified, with perforation and abscess without bleeding: Secondary | ICD-10-CM | POA: Diagnosis not present

## 2020-08-24 DIAGNOSIS — K5780 Diverticulitis of intestine, part unspecified, with perforation and abscess without bleeding: Secondary | ICD-10-CM | POA: Diagnosis not present

## 2020-08-24 DIAGNOSIS — J029 Acute pharyngitis, unspecified: Secondary | ICD-10-CM | POA: Diagnosis not present

## 2020-08-26 ENCOUNTER — Ambulatory Visit: Payer: Medicare Other | Admitting: Cardiovascular Disease

## 2020-08-31 DIAGNOSIS — R5383 Other fatigue: Secondary | ICD-10-CM | POA: Diagnosis not present

## 2020-08-31 DIAGNOSIS — N951 Menopausal and female climacteric states: Secondary | ICD-10-CM | POA: Diagnosis not present

## 2020-09-12 ENCOUNTER — Telehealth: Payer: Self-pay | Admitting: Family Medicine

## 2020-09-12 DIAGNOSIS — R931 Abnormal findings on diagnostic imaging of heart and coronary circulation: Secondary | ICD-10-CM

## 2020-09-12 DIAGNOSIS — L02611 Cutaneous abscess of right foot: Secondary | ICD-10-CM | POA: Diagnosis not present

## 2020-09-12 DIAGNOSIS — L853 Xerosis cutis: Secondary | ICD-10-CM | POA: Diagnosis not present

## 2020-09-12 DIAGNOSIS — L03031 Cellulitis of right toe: Secondary | ICD-10-CM | POA: Diagnosis not present

## 2020-09-12 DIAGNOSIS — M792 Neuralgia and neuritis, unspecified: Secondary | ICD-10-CM | POA: Diagnosis not present

## 2020-09-12 NOTE — Telephone Encounter (Signed)
CT calcium score was abnormal with a score of 637, with 433 of that in one artery (the left anterior descending).  This is concerning for significant plaque buildup in your heart arteries.  I recommend cardiology consult for additional testing.

## 2020-09-13 ENCOUNTER — Encounter: Payer: Self-pay | Admitting: Family Medicine

## 2020-09-13 ENCOUNTER — Ambulatory Visit (INDEPENDENT_AMBULATORY_CARE_PROVIDER_SITE_OTHER): Payer: Medicare Other

## 2020-09-13 ENCOUNTER — Telehealth: Payer: Self-pay | Admitting: Cardiovascular Disease

## 2020-09-13 ENCOUNTER — Ambulatory Visit (INDEPENDENT_AMBULATORY_CARE_PROVIDER_SITE_OTHER): Payer: Medicare Other | Admitting: Family Medicine

## 2020-09-13 ENCOUNTER — Other Ambulatory Visit: Payer: Self-pay

## 2020-09-13 DIAGNOSIS — F4310 Post-traumatic stress disorder, unspecified: Secondary | ICD-10-CM | POA: Diagnosis not present

## 2020-09-13 DIAGNOSIS — M25561 Pain in right knee: Secondary | ICD-10-CM

## 2020-09-13 DIAGNOSIS — R059 Cough, unspecified: Secondary | ICD-10-CM | POA: Diagnosis not present

## 2020-09-13 NOTE — Telephone Encounter (Signed)
Happy to see her Legrand Como .  JJB

## 2020-09-13 NOTE — Telephone Encounter (Signed)
Patient requesting to switch providers from Dr Audie Box to Dr. Sallyanne Kuster patient wanted to go back under the care of Dr. Sallyanne Kuster.   Please advise

## 2020-09-13 NOTE — Telephone Encounter (Signed)
No objections

## 2020-09-13 NOTE — Progress Notes (Signed)
Office Visit Note   Patient: Tamara [name]           Date of Birth: [birth year]           MRN: [id number] Visit Date: 09/13/2020 Requested by: Eunice Blase, MD [place],  [place] [address] PCP: Eunice Blase, MD  Subjective: Chief Complaint  Patient presents with  . Right Knee - Pain    S/p trip and fall in early February of this year. [place] forward "on all fours" on to concrete. Continues to have pain in the knee.  . Cough    Lingering productive cough post covid last month.    HPI: She is here with a cough.  She had Covid last month and recovered from it without difficulty, but she has had a lingering occasional cough which is sometimes productive.  No fevers or chills, no shortness of breath or wheezing.  Also she fell in early February landing on all fours, and has had some right knee pain since then.               ROS:   All other systems were reviewed and are negative.  Objective: Vital Signs: There were no vitals taken for this visit.  Physical Exam:  General:  Alert and oriented, in no acute distress. Pulm:  Breathing unlabored. Psy:  Normal mood, congruent affect  CV: Regular rate and rhythm without murmurs, rubs, or gallops.  No peripheral edema.  2+ radial and posterior tibial pulses. Lungs: Clear to auscultation throughout with no wheezing or areas of consolidation. Right knee: No effusion, full range of motion.  No patellofemoral crepitus.  No pain with patella compression.  She is very tender on the medial joint line and has pain with a palpable click on McMurray's.  Collateral ligaments and Lachman's feels stable.   Imaging: XR Chest 2 View  Result Date: 09/13/2020 CXR shows clear lung fields, no obvious pneumonia or consolidation.   Assessment & Plan: 1.  Persistent mild cough status post COVID infection - Reassurance, inhaled steroids if worsens.   2.  Right knee pain, suspect medial meniscus injury -She does not want to do anything  aggressive.  She will try some over-the-counter supplements.  If symptoms worsen, could consider a dextrose injection or possibly getting approval for gel injections.  3.  Calcium score over 600 - Seeing cardiology next month.     Procedures: No procedures performed        PMFS History: Patient Active Problem List   Diagnosis Date Noted  . Elevated homocysteine 08/05/2020  . Diverticulitis large intestine w/o perforation or abscess w/o bleeding 04/20/2019  . Abscess   . PTSD (post-traumatic stress disorder) 07/12/2018  . Diverticulitis of colon 03/15/2018  . CKD (chronic kidney disease), stage III ([facility]) 03/15/2018  . Sepsis ([facility]) 03/14/2018  . Diverticulosis of colon without hemorrhage 11/09/2016  . Multinodular non-toxic goiter 02/16/2015  . Migraine 11/05/2013  . ASTHMA 08/01/2010  . Hyperlipidemia 09/17/2007  . OBESITY 09/17/2007  . Anxiety 09/17/2007  . Depression 09/17/2007  . Essential hypertension 09/17/2007  . GERD 09/17/2007   Past Medical History:  Diagnosis Date  . Allergy   . Anemia   . Anxiety   . Asthma   . Depression   . Diverticulitis   . GERD (gastroesophageal reflux disease)   . History of short term memory loss   . HTN (hypertension)   . Hyperlipemia   . PTSD (post-traumatic stress disorder)   . Shock therapy as  cause of abnormal reaction of patient or of later complication without mention of misadventure at time of procedure     Family History  Problem Relation Age of Onset  . Arrhythmia Father   . Stroke Father   . Hyperlipidemia Father   . Heart disease Father   . Hypertension Father   . COPD Father   . Heart failure Father   . Coronary artery disease Maternal Grandfather 40       Died 58  . Heart disease Maternal Grandfather   . Hypertension Maternal Grandfather   . Cancer Mother        bone marrow  . Hyperlipidemia Mother   . Hypertension Mother   . Hearing loss Paternal Grandmother   . Hearing loss Paternal Grandfather   .  Diabetes Paternal Grandfather   . Diabetes Daughter   . Colon cancer Paternal Uncle   . Hyperlipidemia Brother   . Hyperlipidemia Maternal Grandmother   . Esophageal cancer Neg Hx   . Rectal cancer Neg Hx   . Stomach cancer Neg Hx     Past Surgical History:  Procedure Laterality Date  . ABDOMINAL HYSTERECTOMY     partial  . CHOLECYSTECTOMY    . COLECTOMY    . COLONOSCOPY    . IR RADIOLOGIST EVAL & MGMT  07/24/2018  . LAPAROSCOPIC SIGMOID COLECTOMY N/A 04/20/2019   Procedure: LAPAROSCOPIC SIGMOID COLECTOMY;  Surgeon: Alphonsa Overall, MD;  Location: [facility];  Service: General;  Laterality: N/A;  . OTHER SURGICAL HISTORY     electric shock therapy  . TUBAL LIGATION    . UPPER GASTROINTESTINAL ENDOSCOPY    . UTERINE FIBROID SURGERY     did a c-section cut   Social History   Occupational History    Employer: UNEMPLOYED    Comment: Disabled  Tobacco Use  . Smoking status: Never Smoker  . Smokeless tobacco: Never Used  Vaping Use  . Vaping Use: Never used  Substance and Sexual Activity  . Alcohol use: No    Alcohol/week: 0.0 standard drinks  . Drug use: No  . Sexual activity: Not on file

## 2020-09-13 NOTE — Telephone Encounter (Signed)
Patient requests to see Dr. Nelle Don for cardiology consult rather than Dr. Gwenlyn Found.

## 2020-09-13 NOTE — Telephone Encounter (Signed)
That is fine with me.  [place] T. Audie Box, MD, [place]  [address] [place], [place] [address] [phone]  9:57 AM

## 2020-09-15 DIAGNOSIS — F4310 Post-traumatic stress disorder, unspecified: Secondary | ICD-10-CM | POA: Diagnosis not present

## 2020-09-20 ENCOUNTER — Encounter: Payer: Self-pay | Admitting: Cardiovascular Disease

## 2020-09-20 ENCOUNTER — Other Ambulatory Visit: Payer: Self-pay

## 2020-09-20 ENCOUNTER — Ambulatory Visit (INDEPENDENT_AMBULATORY_CARE_PROVIDER_SITE_OTHER): Payer: Medicare Other | Admitting: Cardiovascular Disease

## 2020-09-20 VITALS — BP 134/70 | HR 72 | Ht 62.0 in | Wt 199.0 lb

## 2020-09-20 DIAGNOSIS — M5417 Radiculopathy, lumbosacral region: Secondary | ICD-10-CM | POA: Diagnosis not present

## 2020-09-20 DIAGNOSIS — E782 Mixed hyperlipidemia: Secondary | ICD-10-CM | POA: Diagnosis not present

## 2020-09-20 DIAGNOSIS — M9901 Segmental and somatic dysfunction of cervical region: Secondary | ICD-10-CM | POA: Diagnosis not present

## 2020-09-20 DIAGNOSIS — M5032 Other cervical disc degeneration, mid-cervical region, unspecified level: Secondary | ICD-10-CM | POA: Diagnosis not present

## 2020-09-20 DIAGNOSIS — M9902 Segmental and somatic dysfunction of thoracic region: Secondary | ICD-10-CM | POA: Diagnosis not present

## 2020-09-20 DIAGNOSIS — I25118 Atherosclerotic heart disease of native coronary artery with other forms of angina pectoris: Secondary | ICD-10-CM | POA: Diagnosis not present

## 2020-09-20 DIAGNOSIS — I10 Essential (primary) hypertension: Secondary | ICD-10-CM | POA: Diagnosis not present

## 2020-09-20 DIAGNOSIS — E7211 Homocystinuria: Secondary | ICD-10-CM

## 2020-09-20 DIAGNOSIS — F4310 Post-traumatic stress disorder, unspecified: Secondary | ICD-10-CM | POA: Diagnosis not present

## 2020-09-20 DIAGNOSIS — R079 Chest pain, unspecified: Secondary | ICD-10-CM | POA: Diagnosis not present

## 2020-09-20 DIAGNOSIS — M5415 Radiculopathy, thoracolumbar region: Secondary | ICD-10-CM | POA: Diagnosis not present

## 2020-09-20 DIAGNOSIS — M9903 Segmental and somatic dysfunction of lumbar region: Secondary | ICD-10-CM | POA: Diagnosis not present

## 2020-09-20 NOTE — Progress Notes (Signed)
Cardiology Office Note:    Date:  09/23/2020   ID:  Tamara [name], DOB 19-Jan-1961, MRN [id number]  PCP:  Eunice Blase, MD  Cardiologist:  Sanda Klein, MD    Referring MD: Eunice Blase, MD   chief complaint: Chest pain  History of Present Illness:    Tamara [name] is a 60 y.o. female with a hx of Hyperlipidemia intolerant to statin therapy, hypertension, markedly elevated coronary calcium score (637, 90-100th percentile), family history of CAD presenting with complaints of precordial pain.  These are similar to the complaints that she had back at our last appointment together, following which she underwent a Lexiscan Myoview nuclear stress test that showed normal perfusion.  Unfortunately she has a history of severe allergic reaction to iodinated contrast ("swollen throat") and is terrified of receiving contrast again.  She describes her chest pain as a retrosternal tightness that occurs randomly either at rest or with light activity, sometimes radiating towards her shoulders and arms.  It is not associated diaphoresis, shortness of breath, palpitations, dizziness or syncope.  She is quite sedentary before it is worth denies exertional angina or dyspnea.  She does not have orthopnea or PND.  She has had a lot of  left infrascapular pain in the last 3 months and wonders whether this might be representative of a heart symptom as well.  She had Covid infection in February 2022 and believes that her symptoms worsened following that, although she did not have severe complications of the viral illness..  Recently, her blood pressure has been well controlled.  She does not smoke and does not have diabetes mellitus.  She has a longstanding history of hyperlipidemia and has been intolerant to multiple statins including atorvastatin, rosuvastatin and simvastatin, all of which caused intolerable pain in her arms.  Her most recent lipid profile (performed just after her appointment today) showed  an LDL cholesterol of 128, HDL 53, triglycerides 164.  She had a markedly elevated homocysteine at 31.3.  Renal function and hemoglobin A1c were normal.  Has a remote history of anxiety and depression requiring electroconvulsive therapy, currently reports it as being well compensated.  Questionable TIA in 2015 presenting with left-sided numbness, work-up at that time did not show significant carotid disease, LVEF was normal at 55 to 60% without left atrial enlargement or valvular abnormalities, MRI of the brain was normal.  Past Medical History:  Diagnosis Date  . Allergy   . Anemia   . Anxiety   . Asthma   . Depression   . Diverticulitis   . GERD (gastroesophageal reflux disease)   . History of short term memory loss   . HTN (hypertension)   . Hyperlipemia   . PTSD (post-traumatic stress disorder)   . Shock therapy as cause of abnormal reaction of patient or of later complication without mention of misadventure at time of procedure     Past Surgical History:  Procedure Laterality Date  . ABDOMINAL HYSTERECTOMY     partial  . CHOLECYSTECTOMY    . COLECTOMY    . COLONOSCOPY    . IR RADIOLOGIST EVAL & MGMT  07/24/2018  . LAPAROSCOPIC SIGMOID COLECTOMY N/A 04/20/2019   Procedure: LAPAROSCOPIC SIGMOID COLECTOMY;  Surgeon: Alphonsa Overall, MD;  Location: [facility];  Service: General;  Laterality: N/A;  . OTHER SURGICAL HISTORY     electric shock therapy  . TUBAL LIGATION    . UPPER GASTROINTESTINAL ENDOSCOPY    . UTERINE FIBROID SURGERY     did  a c-section cut    Current Medications: Current Meds  Medication Sig  . albuterol (PROVENTIL HFA;VENTOLIN HFA) 108 (90 Base) MCG/ACT inhaler Inhale 2 puffs into the lungs every 6 (six) hours as needed for wheezing or shortness of breath.  . ALPRAZolam (XANAX XR) 1 MG 24 hr tablet Take 2 mg by mouth at bedtime.  . ALPRAZolam (XANAX) 1 MG tablet Take 0.5-1 mg by mouth 2 (two) times daily as needed for anxiety.  Marland Kitchen aspirin 81 MG EC tablet   .  Cholecalciferol (VITAMIN D3) 50000 UNITS CAPS Take 50,000 Units by mouth once a week. (Patient taking differently: Take 50,000 Units by mouth once a week. 1 Tablet Weekly)  . Dexlansoprazole 30 MG capsule   . ezetimibe (ZETIA) 10 MG tablet Take 1 tablet (10 mg total) by mouth daily.  Marland Kitchen losartan (COZAAR) 100 MG tablet Take 1 tablet (100 mg total) by mouth daily.  Marland Kitchen LYSINE PO Take 1 tablet by mouth daily as needed. skin  . Oxcarbazepine (TRILEPTAL) 300 MG tablet Take 300 mg by mouth daily.  . temazepam (RESTORIL) 15 MG capsule Take 15 mg by mouth at bedtime.   . temazepam (RESTORIL) 30 MG capsule Take 30 mg by mouth at bedtime.  . Vilazodone HCl (VIIBRYD) 10 MG TABS Take 10 mg by mouth daily.  . Vilazodone HCl 20 MG TABS Take 20 mg by mouth daily.     Allergies:   Benadryl [diphenhydramine], Darvon [propoxyphene], Gabapentin, Ivp dye [iodinated diagnostic agents], Levaquin [levofloxacin], Morphine and related, Penicillins, Propoxyphene hcl, Rocephin [ceftriaxone], Seroquel [quetiapine fumerate], Statins, Sulfonamide derivatives, Adhesive [tape], Dicyclomine, Fentanyl, Other, Oxycodone, Oxycontin [oxycodone hcl], and Robaxin [methocarbamol]   Social History   Socioeconomic History  . Marital status: Divorced    Spouse name: Not on file  . Number of children: 3  . Years of education: college  . Highest education level: Not on file  Occupational History    Employer: UNEMPLOYED    Comment: Disabled  Tobacco Use  . Smoking status: Never Smoker  . Smokeless tobacco: Never Used  Vaping Use  . Vaping Use: Never used  Substance and Sexual Activity  . Alcohol use: No    Alcohol/week: 0.0 standard drinks  . Drug use: No  . Sexual activity: Not on file  Other Topics Concern  . Not on file  Social History Narrative   Lives with boyfriend. Walks daily for 20 minutes. Education: [employer].   Social Determinants of Health   Financial Resource Strain: Not on file  Food Insecurity: Not on file   Transportation Needs: Not on file  Physical Activity: Not on file  Stress: Not on file  Social Connections: Not on file     Family History: The patient's family history includes Arrhythmia in her father; COPD in her father; Cancer in her mother; Colon cancer in her paternal uncle; Coronary artery disease (age of onset: 23) in her maternal grandfather; Diabetes in her daughter and paternal grandfather; Hearing loss in her paternal grandfather and paternal grandmother; Heart disease in her father and maternal grandfather; Heart failure in her father; Hyperlipidemia in her brother, father, maternal grandmother, and mother; Hypertension in her father, maternal grandfather, and mother; Stroke in her father. There is no history of Esophageal cancer, Rectal cancer, or Stomach cancer. ROS:   Please see the history of present illness.     All other systems reviewed and are negative.  EKGs/Labs/Other Studies Reviewed:     EKG:  EKG is not ordered today.  Recent Labs: 08/05/2020: BUN 18; Creat 1.02; Potassium 4.0; Sodium 138  Recent Lipid Panel    Component Value Date/Time   CHOL 210 (H) 09/22/2020 0917   TRIG 164 (H) 09/22/2020 0917   HDL 53 09/22/2020 0917   CHOLHDL 4.0 09/22/2020 0917   CHOLHDL 4.0 10/05/2015 1540   VLDL 35 (H) 10/05/2015 1540   LDLCALC 128 (H) 09/22/2020 0917   LDLDIRECT 94.2 01/12/2010 0000    Physical Exam:    VS:  BP 134/70   Pulse 72   Ht 5\' 2"  (1.575 m)   Wt 199 lb (90.3 kg)   SpO2 98%   BMI 36.40 kg/m     Wt Readings from Last 3 Encounters:  09/20/20 199 lb (90.3 kg)  08/05/20 199 lb 6.4 oz (90.4 kg)  04/27/20 195 lb (88.5 kg)     General: Alert, oriented x3, no distress, severely obese Head: no evidence of trauma, PERRL, EOMI, no exophtalmos or lid lag, no myxedema, no xanthelasma; normal ears, nose and oropharynx Neck: normal jugular venous pulsations and no hepatojugular reflux; brisk carotid pulses without delay and no carotid bruits Chest:  clear to auscultation, no signs of consolidation by percussion or palpation, normal fremitus, symmetrical and full respiratory excursions Cardiovascular: normal position and quality of the apical impulse, regular rhythm, normal first and second heart sounds, no murmurs, rubs or gallops Abdomen: no tenderness or distention, no masses by palpation, no abnormal pulsatility or arterial bruits, normal bowel sounds, no hepatosplenomegaly Extremities: no clubbing, cyanosis or edema; 2+ radial, ulnar and brachial pulses bilaterally; 2+ right femoral, posterior tibial and dorsalis pedis pulses; 2+ left femoral, posterior tibial and dorsalis pedis pulses; no subclavian or femoral bruits Neurological: grossly nonfocal Psych: Normal mood and affect   ASSESSMENT:    1. Coronary artery disease involving native coronary artery of native heart with other form of angina pectoris ([facility])   2. Essential hypertension   3. Mixed hyperlipidemia   4. Hyperhomocystinemia (HCC)   5. Chest pain, unspecified type    PLAN:    In order of problems listed above:  1. CAD: She has a markedly elevated calcium score as well as multiple coronary risk factors.  The best test to evaluate for obstructive CAD would be conventional coronary angiography.  Unfortunately she had a severe allergic reaction to contrast and does not want to risk that procedure, even with steroid premedication.  Her symptoms are atypical.  We will go ahead with another [place] Myoview study and only proceed to coronary angiography (with the necessary precautions against allergic reactions) if she has a large reversible defect.  She understands that we do not have alternatives to iodinated contrast for definitive diagnoses. 2. HTN: Well-controlled. 3. HLP: I think the presence of the markedly elevated calcium score is enough proof that she is at high risk for coronary complications and we should proceed with treatment with a PCSK9 inhibitor such as Repatha or  Praluent.  Ezetimibe is clearly not sufficient to achieve target LDL less than 70.   4. Hyperhomocystinemia: Unclear whether this was obtained during a period of stability.  Should be repeated.  Would possibly benefit from treatment with folic acid, vitamin B6 and betaine.  May have a MTHFR mutation.   Medication Adjustments/Labs and Tests Ordered: Current medicines are reviewed at length with the patient today.  Concerns regarding medicines are outlined above.  Orders Placed This Encounter  Procedures  . Lipid panel  . Cardiac Stress Test: Informed Consent Details: Physician/Practitioner Attestation; Transcribe  to consent form and obtain patient signature  . MYOCARDIAL PERFUSION IMAGING   No orders of the defined types were placed in this encounter.   Signed, Sanda Klein, MD  09/23/2020 7:52 PM    [facility]

## 2020-09-20 NOTE — Patient Instructions (Signed)
Medication Instructions:  No changes *If you need a refill on your cardiac medications before your next appointment, please call your pharmacy*   Lab Work: Your provider would like for you to have the following labs: fasting Lipid  If you have labs (blood work) drawn today and your tests are completely normal, you will receive your results only by: Marland Kitchen MyChart Message (if you have MyChart) OR . A paper copy in the mail If you have any lab test that is abnormal or we need to change your treatment, we will call you to review the results.   Testing/Procedures: Your physician has requested that you have a lexiscan myoview. For further information please visit [email or web address]. Please follow instruction sheet, as given. This will take place at [place], [address]  How to prepare for your Myocardial Perfusion Test:  Do not eat or drink 3 hours prior to your test, except you may have water.  Do not consume products containing caffeine (regular or decaffeinated) 12 hours prior to your test. (ex: coffee, chocolate, sodas, tea).  Do bring a list of your current medications with you.  If not listed below, you may take your medications as normal.  Do wear comfortable clothes (no dresses or overalls) and walking shoes, tennis shoes preferred (No heels or open toe shoes are allowed).  Do NOT wear cologne, perfume, aftershave, or lotions (deodorant is allowed).  The test will take approximately 3 to 4 hours to complete  If these instructions are not followed, your test will have to be rescheduled.   Follow-Up: At [facility], you and your health needs are our priority.  As part of our continuing mission to provide you with exceptional heart care, we have created designated Provider Care Teams.  These Care Teams include your primary Cardiologist (physician) and Advanced Practice Providers (APPs -  Physician Assistants and Nurse Practitioners) who all work together to provide you with  the care you need, when you need it.  We recommend signing up for the patient portal called "MyChart".  Sign up information is provided on this After Visit Summary.  MyChart is used to connect with patients for Virtual Visits (Telemedicine).  Patients are able to view lab/test results, encounter notes, upcoming appointments, etc.  Non-urgent messages can be sent to your provider as well.   To learn more about what you can do with MyChart, go to [email or web address].    Your next appointment:   12 month(s)  The format for your next appointment:   In Person  Provider:   You may see Sanda Klein, MD or one of the following Advanced Practice Providers on your designated Care Team:    Almyra Deforest, PA-C  Fabian Sharp, PA-C or   Roby Lofts, [place]

## 2020-09-22 DIAGNOSIS — M9902 Segmental and somatic dysfunction of thoracic region: Secondary | ICD-10-CM | POA: Diagnosis not present

## 2020-09-22 DIAGNOSIS — M5415 Radiculopathy, thoracolumbar region: Secondary | ICD-10-CM | POA: Diagnosis not present

## 2020-09-22 DIAGNOSIS — E782 Mixed hyperlipidemia: Secondary | ICD-10-CM | POA: Diagnosis not present

## 2020-09-22 DIAGNOSIS — F4310 Post-traumatic stress disorder, unspecified: Secondary | ICD-10-CM | POA: Diagnosis not present

## 2020-09-22 DIAGNOSIS — M5032 Other cervical disc degeneration, mid-cervical region, unspecified level: Secondary | ICD-10-CM | POA: Diagnosis not present

## 2020-09-22 DIAGNOSIS — R079 Chest pain, unspecified: Secondary | ICD-10-CM | POA: Diagnosis not present

## 2020-09-22 DIAGNOSIS — M9903 Segmental and somatic dysfunction of lumbar region: Secondary | ICD-10-CM | POA: Diagnosis not present

## 2020-09-22 DIAGNOSIS — M5417 Radiculopathy, lumbosacral region: Secondary | ICD-10-CM | POA: Diagnosis not present

## 2020-09-22 DIAGNOSIS — M9901 Segmental and somatic dysfunction of cervical region: Secondary | ICD-10-CM | POA: Diagnosis not present

## 2020-09-23 LAB — LIPID PANEL
Chol/HDL Ratio: 4 ratio (ref 0.0–4.4)
Cholesterol, Total: 210 mg/dL — ABNORMAL HIGH (ref 100–199)
HDL: 53 mg/dL (ref >39)
LDL Chol Calc (NIH): 128 mg/dL — ABNORMAL HIGH (ref 0–99)
Triglycerides: 164 mg/dL — ABNORMAL HIGH (ref 0–149)
VLDL Cholesterol Cal: 29 mg/dL (ref 5–40)

## 2020-09-26 DIAGNOSIS — L03031 Cellulitis of right toe: Secondary | ICD-10-CM | POA: Diagnosis not present

## 2020-09-27 DIAGNOSIS — M9901 Segmental and somatic dysfunction of cervical region: Secondary | ICD-10-CM | POA: Diagnosis not present

## 2020-09-27 DIAGNOSIS — M5415 Radiculopathy, thoracolumbar region: Secondary | ICD-10-CM | POA: Diagnosis not present

## 2020-09-27 DIAGNOSIS — M9902 Segmental and somatic dysfunction of thoracic region: Secondary | ICD-10-CM | POA: Diagnosis not present

## 2020-09-27 DIAGNOSIS — M5032 Other cervical disc degeneration, mid-cervical region, unspecified level: Secondary | ICD-10-CM | POA: Diagnosis not present

## 2020-09-27 DIAGNOSIS — E782 Mixed hyperlipidemia: Secondary | ICD-10-CM | POA: Diagnosis not present

## 2020-09-27 DIAGNOSIS — M9903 Segmental and somatic dysfunction of lumbar region: Secondary | ICD-10-CM | POA: Diagnosis not present

## 2020-09-27 DIAGNOSIS — F4310 Post-traumatic stress disorder, unspecified: Secondary | ICD-10-CM | POA: Diagnosis not present

## 2020-09-27 DIAGNOSIS — M5417 Radiculopathy, lumbosacral region: Secondary | ICD-10-CM | POA: Diagnosis not present

## 2020-09-28 ENCOUNTER — Telehealth: Payer: Self-pay | Admitting: Cardiovascular Disease

## 2020-09-28 NOTE — Telephone Encounter (Signed)
No objections

## 2020-09-28 NOTE — Telephone Encounter (Signed)
New Message:     Pt would like to switch from Dr C to Dr Gwenlyn Found. Is this alright with both of you.

## 2020-09-29 DIAGNOSIS — M9903 Segmental and somatic dysfunction of lumbar region: Secondary | ICD-10-CM | POA: Diagnosis not present

## 2020-09-29 DIAGNOSIS — M5415 Radiculopathy, thoracolumbar region: Secondary | ICD-10-CM | POA: Diagnosis not present

## 2020-09-29 DIAGNOSIS — M9902 Segmental and somatic dysfunction of thoracic region: Secondary | ICD-10-CM | POA: Diagnosis not present

## 2020-09-29 DIAGNOSIS — M5032 Other cervical disc degeneration, mid-cervical region, unspecified level: Secondary | ICD-10-CM | POA: Diagnosis not present

## 2020-09-29 DIAGNOSIS — F4310 Post-traumatic stress disorder, unspecified: Secondary | ICD-10-CM | POA: Diagnosis not present

## 2020-09-29 DIAGNOSIS — M9901 Segmental and somatic dysfunction of cervical region: Secondary | ICD-10-CM | POA: Diagnosis not present

## 2020-09-29 DIAGNOSIS — M5417 Radiculopathy, lumbosacral region: Secondary | ICD-10-CM | POA: Diagnosis not present

## 2020-09-29 NOTE — Telephone Encounter (Signed)
Fine with me

## 2020-10-03 NOTE — Telephone Encounter (Signed)
Pt returning call to Sunday Spillers in reference to provider switch information from previous week. Please Advise

## 2020-10-03 NOTE — Telephone Encounter (Signed)
Spoke with pt regarding switch in provider. Pt states that she already has appointment with Dr. Gwenlyn Found but was just calling to see if there were any cancellations on Dr. Kennon Holter schedule. Pt informed that as of now there have not been any cancellations but that we would leave her on the waitlist incase something becomes available. Pt verbalizes understanding.

## 2020-10-04 ENCOUNTER — Inpatient Hospital Stay (HOSPITAL_COMMUNITY): Admission: RE | Admit: 2020-10-04 | Payer: Medicare Other | Source: Ambulatory Visit

## 2020-10-05 DIAGNOSIS — M9903 Segmental and somatic dysfunction of lumbar region: Secondary | ICD-10-CM | POA: Diagnosis not present

## 2020-10-05 DIAGNOSIS — M5415 Radiculopathy, thoracolumbar region: Secondary | ICD-10-CM | POA: Diagnosis not present

## 2020-10-05 DIAGNOSIS — M9901 Segmental and somatic dysfunction of cervical region: Secondary | ICD-10-CM | POA: Diagnosis not present

## 2020-10-05 DIAGNOSIS — M5417 Radiculopathy, lumbosacral region: Secondary | ICD-10-CM | POA: Diagnosis not present

## 2020-10-05 DIAGNOSIS — M9902 Segmental and somatic dysfunction of thoracic region: Secondary | ICD-10-CM | POA: Diagnosis not present

## 2020-10-05 DIAGNOSIS — M5032 Other cervical disc degeneration, mid-cervical region, unspecified level: Secondary | ICD-10-CM | POA: Diagnosis not present

## 2020-10-06 DIAGNOSIS — M5032 Other cervical disc degeneration, mid-cervical region, unspecified level: Secondary | ICD-10-CM | POA: Diagnosis not present

## 2020-10-06 DIAGNOSIS — M5415 Radiculopathy, thoracolumbar region: Secondary | ICD-10-CM | POA: Diagnosis not present

## 2020-10-06 DIAGNOSIS — M9903 Segmental and somatic dysfunction of lumbar region: Secondary | ICD-10-CM | POA: Diagnosis not present

## 2020-10-06 DIAGNOSIS — F4310 Post-traumatic stress disorder, unspecified: Secondary | ICD-10-CM | POA: Diagnosis not present

## 2020-10-06 DIAGNOSIS — M9901 Segmental and somatic dysfunction of cervical region: Secondary | ICD-10-CM | POA: Diagnosis not present

## 2020-10-06 DIAGNOSIS — M5417 Radiculopathy, lumbosacral region: Secondary | ICD-10-CM | POA: Diagnosis not present

## 2020-10-06 DIAGNOSIS — M9902 Segmental and somatic dysfunction of thoracic region: Secondary | ICD-10-CM | POA: Diagnosis not present

## 2020-10-10 ENCOUNTER — Ambulatory Visit: Payer: Medicare Other | Admitting: Cardiovascular Disease

## 2020-10-17 DIAGNOSIS — M5032 Other cervical disc degeneration, mid-cervical region, unspecified level: Secondary | ICD-10-CM | POA: Diagnosis not present

## 2020-10-17 DIAGNOSIS — M5415 Radiculopathy, thoracolumbar region: Secondary | ICD-10-CM | POA: Diagnosis not present

## 2020-10-17 DIAGNOSIS — M9903 Segmental and somatic dysfunction of lumbar region: Secondary | ICD-10-CM | POA: Diagnosis not present

## 2020-10-17 DIAGNOSIS — M9901 Segmental and somatic dysfunction of cervical region: Secondary | ICD-10-CM | POA: Diagnosis not present

## 2020-10-17 DIAGNOSIS — M5417 Radiculopathy, lumbosacral region: Secondary | ICD-10-CM | POA: Diagnosis not present

## 2020-10-17 DIAGNOSIS — M9902 Segmental and somatic dysfunction of thoracic region: Secondary | ICD-10-CM | POA: Diagnosis not present

## 2020-10-18 DIAGNOSIS — F4310 Post-traumatic stress disorder, unspecified: Secondary | ICD-10-CM | POA: Diagnosis not present

## 2020-10-20 DIAGNOSIS — M9902 Segmental and somatic dysfunction of thoracic region: Secondary | ICD-10-CM | POA: Diagnosis not present

## 2020-10-20 DIAGNOSIS — M9901 Segmental and somatic dysfunction of cervical region: Secondary | ICD-10-CM | POA: Diagnosis not present

## 2020-10-20 DIAGNOSIS — M9903 Segmental and somatic dysfunction of lumbar region: Secondary | ICD-10-CM | POA: Diagnosis not present

## 2020-10-20 DIAGNOSIS — M5417 Radiculopathy, lumbosacral region: Secondary | ICD-10-CM | POA: Diagnosis not present

## 2020-10-20 DIAGNOSIS — F4310 Post-traumatic stress disorder, unspecified: Secondary | ICD-10-CM | POA: Diagnosis not present

## 2020-10-20 DIAGNOSIS — M5032 Other cervical disc degeneration, mid-cervical region, unspecified level: Secondary | ICD-10-CM | POA: Diagnosis not present

## 2020-10-20 DIAGNOSIS — M5415 Radiculopathy, thoracolumbar region: Secondary | ICD-10-CM | POA: Diagnosis not present

## 2020-10-21 ENCOUNTER — Encounter: Payer: Self-pay | Admitting: Cardiovascular Disease

## 2020-10-21 ENCOUNTER — Ambulatory Visit (INDEPENDENT_AMBULATORY_CARE_PROVIDER_SITE_OTHER): Payer: Medicare Other | Admitting: Cardiovascular Disease

## 2020-10-21 ENCOUNTER — Other Ambulatory Visit: Payer: Self-pay

## 2020-10-21 DIAGNOSIS — R072 Precordial pain: Secondary | ICD-10-CM

## 2020-10-21 DIAGNOSIS — I25118 Atherosclerotic heart disease of native coronary artery with other forms of angina pectoris: Secondary | ICD-10-CM | POA: Diagnosis not present

## 2020-10-21 DIAGNOSIS — I10 Essential (primary) hypertension: Secondary | ICD-10-CM

## 2020-10-21 DIAGNOSIS — R0789 Other chest pain: Secondary | ICD-10-CM

## 2020-10-21 DIAGNOSIS — E782 Mixed hyperlipidemia: Secondary | ICD-10-CM | POA: Diagnosis not present

## 2020-10-21 MED ORDER — PREDNISONE 50 MG PO TABS: ORAL_TABLET | ORAL | 0 refills | Status: DC

## 2020-10-21 MED ORDER — METOPROLOL TARTRATE 100 MG PO TABS: 100.0000 mg | ORAL_TABLET | Freq: Once | ORAL | 0 refills | Status: DC

## 2020-10-21 NOTE — Assessment & Plan Note (Signed)
History of atypical chest pain occurring weekly for the last of several years with a negative Myoview 02/27/2017.  Risk factors include family history, hyperlipidemia and hypertension.  She has severe contrast allergy.  She recently had coronary calcium score performed by her PCP which was 637 with calcium primarily in the LAD but also in the left main and circumflex.  Based on this we will proceed with coronary CTA with contrast allergy prophylaxis.

## 2020-10-21 NOTE — Assessment & Plan Note (Signed)
History of essential hypertension blood pressure measured at 118/88.  She is on losartan.

## 2020-10-21 NOTE — Progress Notes (Signed)
10/21/2020 Tamara [name]   01-Dec-1960  [id number]  Primary Physician Hilts, Legrand Como, MD Primary Cardiologist: Tamara Harp MD Tamara [name], Tamara [name]  HPI:  Tamara [name] is a 60 y.o. moderately overweight single Caucasian female (lives with her significant other for the last 75 years) mother of 6, grandmother to 2 grandchildren who is currently disabled because of psychiatric issues.  She was referred by her PCP, Dr. Eunice [name], for cardiovascular evaluation.  She has seen Dr. Sallyanne [name] in the past.  Risk factors include treated hypertension and hyperlipidemia although she is statin intolerant and not at goal.  She does have a family history of heart disease with father who had CAD and bypass surgery.  She is never had a heart attack or stroke.  She did have COVID-19 back in February of this year but was lost nonhospitalized.  She is complained of chest pain weekly for many years which has not changed in frequency or severity.  She did have a negative Myoview 02/27/2017 although had a reaction I suspect Lexiscan.   Current Meds  Medication Sig  . albuterol (PROVENTIL HFA;VENTOLIN HFA) 108 (90 Base) MCG/ACT inhaler Inhale 2 puffs into the lungs every 6 (six) hours as needed for wheezing or shortness of breath.  . ALPRAZolam (XANAX XR) 1 MG 24 hr tablet Take 2 mg by mouth at bedtime.  . ALPRAZolam (XANAX) 1 MG tablet Take 0.5-1 mg by mouth 2 (two) times daily as needed for anxiety.  Marland Kitchen aspirin 81 MG EC tablet   . Cholecalciferol (VITAMIN D3) 50000 UNITS CAPS Take 50,000 Units by mouth once a week. (Patient taking differently: Take 50,000 Units by mouth once a week. 1 Tablet Weekly)  . Dexlansoprazole 30 MG capsule Takes every third day of the week.  . ezetimibe (ZETIA) 10 MG tablet Take 1 tablet (10 mg total) by mouth daily.  Marland Kitchen losartan (COZAAR) 100 MG tablet Take 1 tablet (100 mg total) by mouth daily.  Marland Kitchen LYSINE PO Take 1 tablet by mouth daily as needed. skin  . Oxcarbazepine  (TRILEPTAL) 300 MG tablet Take 300 mg by mouth daily.  . temazepam (RESTORIL) 15 MG capsule Take 15 mg by mouth at bedtime.   . temazepam (RESTORIL) 30 MG capsule Take 30 mg by mouth at bedtime.  . Vilazodone HCl (VIIBRYD) 10 MG TABS Take 10 mg by mouth daily.  . Vilazodone HCl 20 MG TABS Take 20 mg by mouth daily.     Allergies  Allergen Reactions  . Darvon [Propoxyphene] Other (See Comments)    Hallucinations   . Gabapentin     Unknown reaction, patient cannot remember   . Ivp Dye [Iodinated Diagnostic Agents] Anaphylaxis and Swelling  . Levaquin [Levofloxacin] Nausea And Vomiting    Patient states she tolerates Cipro  . Morphine And Related Itching  . Penicillins Rash    **Tolerated cefazolin 07/15/18**  Has patient had a PCN reaction causing immediate rash, facial/tongue/throat swelling, SOB or lightheadedness with hypotension: yes Has patient had a PCN reaction causing severe rash involving mucus membranes or skin necrosis: yes Has patient had a PCN reaction that required hospitalization: No Has patient had a PCN reaction occurring within the last 10 years: yes If all of the above answers are "NO", then may proceed with Cephalosporin use.   Marland Kitchen Propoxyphene Hcl Other (See Comments)    hallucination  . Rocephin [Ceftriaxone] Rash    Welts; Pt said she tolerates keflex Tolerated cefazolin 07/15/18  . Seroquel [  Quetiapine Fumerate]     Rectal bleeding, abdominal cramping  . Statins Other (See Comments)    Severe mm pain (esp. Arms) >> DOES NOT WANT TO RETRY!  . Sulfonamide Derivatives Rash    Welts   . Adhesive [Tape]     This is tape as well as adhesive on bandaids.  . Dicyclomine     Vision loss in one eye  . Fentanyl Other (See Comments)    Headaches  . Other     All Anti-Histamines  . Oxycodone Other (See Comments)    amnesia  . Oxycontin [Oxycodone Hcl]     Amnesia  . Robaxin [Methocarbamol]     GI upset   . Benadryl [Diphenhydramine] Itching    Social  History   Socioeconomic History  . Marital status: Divorced    Spouse name: Not on file  . Number of children: 3  . Years of education: college  . Highest education level: Not on file  Occupational History    Employer: UNEMPLOYED    Comment: Disabled  Tobacco Use  . Smoking status: Never Smoker  . Smokeless tobacco: Never Used  Vaping Use  . Vaping Use: Never used  Substance and Sexual Activity  . Alcohol use: No    Alcohol/week: 0.0 standard drinks  . Drug use: No  . Sexual activity: Not on file  Other Topics Concern  . Not on file  Social History Narrative   Lives with boyfriend. Walks daily for 20 minutes. Education: [employer].   Social Determinants of Health   Financial Resource Strain: Not on file  Food Insecurity: Not on file  Transportation Needs: Not on file  Physical Activity: Not on file  Stress: Not on file  Social Connections: Not on file  Intimate Partner Violence: Not on file     Review of Systems: General: negative for chills, fever, night sweats or weight changes.  Cardiovascular: negative for chest pain, dyspnea on exertion, edema, orthopnea, palpitations, paroxysmal nocturnal dyspnea or shortness of breath Dermatological: negative for rash Respiratory: negative for cough or wheezing Urologic: negative for hematuria Abdominal: negative for nausea, vomiting, diarrhea, bright red blood per rectum, melena, or hematemesis Neurologic: negative for visual changes, syncope, or dizziness All other systems reviewed and are otherwise negative except as noted above.    Blood pressure 118/88, pulse 85, height 5\' 2"  (1.575 m), weight 200 lb (90.7 kg).  General appearance: alert and no distress Neck: no adenopathy, no carotid bruit, no JVD, supple, symmetrical, trachea midline and thyroid not enlarged, symmetric, no tenderness/mass/nodules Lungs: clear to auscultation bilaterally Heart: regular rate and rhythm, S1, S2 normal, no murmur, click, rub or  gallop Extremities: extremities normal, atraumatic, no cyanosis or edema Pulses: 2+ and symmetric Skin: Skin color, texture, turgor normal. No rashes or lesions Neurologic: Alert and oriented X 3, normal strength and tone. Normal symmetric reflexes. Normal coordination and gait  EKG sinus rhythm at 85 with left atrial enlargement and nonspecific ST and T wave changes.  I personally reviewed this EKG.  ASSESSMENT AND PLAN:   Hyperlipidemia History of hyperlipidemia on Zetia intolerant to statin therapy.  Her most recent lipid profile performed 09/22/2020 revealed total cholesterol 210, LDL 128 and HDL of 53, not at goal for secondary prevention given her elevated coronary calcium score.  I believe she is a candidate for PCSK9.  Essential hypertension History of essential hypertension blood pressure measured at 118/88.  She is on losartan.  Atypical chest pain History of atypical chest pain occurring  weekly for the last of several years with a negative Myoview 02/27/2017.  Risk factors include family history, hyperlipidemia and hypertension.  She has severe contrast allergy.  She recently had coronary calcium score performed by her PCP which was 637 with calcium primarily in the LAD but also in the left main and circumflex.  Based on this we will proceed with coronary CTA with contrast allergy prophylaxis.      Tamara Harp MD FACP,FACC,FAHA, [facility] 10/21/2020 2:18 PM

## 2020-10-21 NOTE — Patient Instructions (Addendum)
Medication Instructions:  Your physician recommends that you continue on your current medications as directed. Please refer to the Current Medication list given to you today.  *If you need a refill on your cardiac medications before your next appointment, please call your pharmacy*   Lab Work: Your physician recommends that you return for lab work in: no more that 7 days prior to coronary CTA.  If you have labs (blood work) drawn today and your tests are completely normal, you will receive your results only by: Marland Kitchen MyChart Message (if you have MyChart) OR . A paper copy in the mail If you have any lab test that is abnormal or we need to change your treatment, we will call you to review the results.   Testing/Procedures: Your cardiac CT will be scheduled at one of the below locations:   [facility] [address] [place], [place] [address] [phone]  If scheduled at [facility], please arrive at the [facility] main entrance (entrance A) of [facility] 30 minutes prior to test start time. Proceed to the [facility] Radiology Department (first floor) to check-in and test prep.  Please follow these instructions carefully (unless otherwise directed):  Hold all erectile dysfunction medications at least 3 days (72 hrs) prior to test.  On the Night Before the Test: . Be sure to Drink plenty of water. . Do not consume any caffeinated/decaffeinated beverages or chocolate 12 hours prior to your test. . Do not take any antihistamines 12 hours prior to your test. . If the patient has contrast allergy: ? Patient will need a prescription for Prednisone and very clear instructions (as follows): 1. Prednisone 50 mg - take 13 hours prior to test 2. Take another Prednisone 50 mg 7 hours prior to test 3. Take another Prednisone 50 mg 1 hour prior to test 4. Take Benadryl 50 mg 1 hour prior to test . Patient must complete all four doses of above prophylactic  medications. . Patient will need a ride after test due to Benadryl.  On the Day of the Test: . Drink plenty of water until 1 hour prior to the test. . Do not eat any food 4 hours prior to the test. . You may take your regular medications prior to the test.  . Take metoprolol (Lopressor) 100mg  two hours prior to test. . HOLD Furosemide/Hydrochlorothiazide morning of the test. . FEMALES- please wear underwire-free bra if available  After the Test: . Drink plenty of water. . After receiving IV contrast, you may experience a mild flushed feeling. This is normal. . On occasion, you may experience a mild rash up to 24 hours after the test. This is not dangerous. If this occurs, you can take Benadryl 25 mg and increase your fluid intake. . If you experience trouble breathing, this can be serious. If it is severe call 911 IMMEDIATELY. If it is mild, please call our office. . If you take any of these medications: Glipizide/Metformin, Avandament, Glucavance, please do not take 48 hours after completing test unless otherwise instructed.   Once we have confirmed authorization from your insurance company, we will call you to set up a date and time for your test. Based on how quickly your insurance processes prior authorizations requests, please allow up to 4 weeks to be contacted for scheduling your Cardiac CT appointment. Be advised that routine Cardiac CT appointments could be scheduled as many as 8 weeks after your provider has ordered it.  For non-scheduling  related questions, please contact the cardiac imaging nurse navigator should you have any questions/concerns: Marchia Bond, Cardiac Imaging Nurse Navigator Gordy Clement, Cardiac Imaging Nurse Navigator [facility] Direct Office Dial: [phone]   For scheduling needs, including cancellations and rescheduling, please call [place], [phone].    Follow-Up: At [facility], you and your health needs are  our priority.  As part of our continuing mission to provide you with exceptional heart care, we have created designated Provider Care Teams.  These Care Teams include your primary Cardiologist (physician) and Advanced Practice Providers (APPs -  Physician Assistants and Nurse Practitioners) who all work together to provide you with the care you need, when you need it.  We recommend signing up for the patient portal called "MyChart".  Sign up information is provided on this After Visit Summary.  MyChart is used to connect with patients for Virtual Visits (Telemedicine).  Patients are able to view lab/test results, encounter notes, upcoming appointments, etc.  Non-urgent messages can be sent to your provider as well.   To learn more about what you can do with MyChart, go to [email or web address].    Your next appointment:   6 month(s)  The format for your next appointment:   In Person  Provider:   Quay Burow, MD.  Other Instructions We will schedule an appointment for you to come back to see PharmD about Repatha.

## 2020-10-21 NOTE — Assessment & Plan Note (Signed)
History of hyperlipidemia on Zetia intolerant to statin therapy.  Her most recent lipid profile performed 09/22/2020 revealed total cholesterol 210, LDL 128 and HDL of 53, not at goal for secondary prevention given her elevated coronary calcium score.  I believe she is a candidate for PCSK9.

## 2020-10-25 ENCOUNTER — Telehealth: Payer: Self-pay | Admitting: Cardiovascular Disease

## 2020-10-25 ENCOUNTER — Other Ambulatory Visit: Payer: Medicare Other

## 2020-10-25 DIAGNOSIS — M5417 Radiculopathy, lumbosacral region: Secondary | ICD-10-CM | POA: Diagnosis not present

## 2020-10-25 DIAGNOSIS — M9903 Segmental and somatic dysfunction of lumbar region: Secondary | ICD-10-CM | POA: Diagnosis not present

## 2020-10-25 DIAGNOSIS — M5415 Radiculopathy, thoracolumbar region: Secondary | ICD-10-CM | POA: Diagnosis not present

## 2020-10-25 DIAGNOSIS — M9902 Segmental and somatic dysfunction of thoracic region: Secondary | ICD-10-CM | POA: Diagnosis not present

## 2020-10-25 DIAGNOSIS — M9901 Segmental and somatic dysfunction of cervical region: Secondary | ICD-10-CM | POA: Diagnosis not present

## 2020-10-25 DIAGNOSIS — M5032 Other cervical disc degeneration, mid-cervical region, unspecified level: Secondary | ICD-10-CM | POA: Diagnosis not present

## 2020-10-25 NOTE — Telephone Encounter (Signed)
Returned the call to the patient. She stated that she is having a cardiac ct and will need a BMET. The Lab order she has is for another patient. She will bring the lab order to the office tomorrow. She has been advised that her order is in and she amy come tomorrow at her convenience.

## 2020-10-25 NOTE — Telephone Encounter (Signed)
Patient called to say that her lab order had a man name on it. And she was planning on going tomorrow to have lab work done. Please advise

## 2020-10-25 NOTE — Telephone Encounter (Signed)
Tamara [name]/ Tamara [name], has this been addressed?  JJB

## 2020-10-26 DIAGNOSIS — R0789 Other chest pain: Secondary | ICD-10-CM | POA: Diagnosis not present

## 2020-10-26 DIAGNOSIS — R072 Precordial pain: Secondary | ICD-10-CM | POA: Diagnosis not present

## 2020-10-26 DIAGNOSIS — I10 Essential (primary) hypertension: Secondary | ICD-10-CM | POA: Diagnosis not present

## 2020-10-26 DIAGNOSIS — E782 Mixed hyperlipidemia: Secondary | ICD-10-CM | POA: Diagnosis not present

## 2020-10-26 LAB — BASIC METABOLIC PANEL
BUN/Creatinine Ratio: 10 (ref 9–23)
BUN: 10 mg/dL (ref 6–24)
CO2: 24 mmol/L (ref 20–29)
Calcium: 9.9 mg/dL (ref 8.7–10.2)
Chloride: 102 mmol/L (ref 96–106)
Creatinine, Ser: 1.02 mg/dL — ABNORMAL HIGH (ref 0.57–1.00)
Glucose: 108 mg/dL — ABNORMAL HIGH (ref 65–99)
Potassium: 4.2 mmol/L (ref 3.5–5.2)
Sodium: 139 mmol/L (ref 134–144)
eGFR: 64 mL/min/{1.73_m2} (ref >59)

## 2020-10-27 DIAGNOSIS — M9903 Segmental and somatic dysfunction of lumbar region: Secondary | ICD-10-CM | POA: Diagnosis not present

## 2020-10-27 DIAGNOSIS — M9902 Segmental and somatic dysfunction of thoracic region: Secondary | ICD-10-CM | POA: Diagnosis not present

## 2020-10-27 DIAGNOSIS — M5415 Radiculopathy, thoracolumbar region: Secondary | ICD-10-CM | POA: Diagnosis not present

## 2020-10-27 DIAGNOSIS — F4310 Post-traumatic stress disorder, unspecified: Secondary | ICD-10-CM | POA: Diagnosis not present

## 2020-10-27 DIAGNOSIS — M5417 Radiculopathy, lumbosacral region: Secondary | ICD-10-CM | POA: Diagnosis not present

## 2020-10-27 DIAGNOSIS — M9901 Segmental and somatic dysfunction of cervical region: Secondary | ICD-10-CM | POA: Diagnosis not present

## 2020-10-27 DIAGNOSIS — M5032 Other cervical disc degeneration, mid-cervical region, unspecified level: Secondary | ICD-10-CM | POA: Diagnosis not present

## 2020-10-31 ENCOUNTER — Telehealth (HOSPITAL_COMMUNITY): Payer: Self-pay | Admitting: Emergency Medicine

## 2020-10-31 NOTE — Telephone Encounter (Signed)
Reaching out to patient to offer assistance regarding upcoming cardiac imaging study; pt verbalizes understanding of appt date/time, parking situation and where to check in, pre-test NPO status and medications ordered, and verified current allergies; name and call back number provided for further questions should they arise Marchia Bond RN Navigator Cardiac Imaging [facility] [phone] office [phone] cell   13 hr prep + 100mg  metoprolol  Clarise Cruz

## 2020-11-01 ENCOUNTER — Ambulatory Visit (HOSPITAL_COMMUNITY)
Admission: RE | Admit: 2020-11-01 | Discharge: 2020-11-01 | Disposition: A | Discharge status: Home / Self Care | Payer: Medicare Other | Source: Ambulatory Visit | Attending: Cardiovascular Disease | Admitting: Cardiovascular Disease

## 2020-11-01 ENCOUNTER — Encounter (HOSPITAL_COMMUNITY): Payer: Self-pay

## 2020-11-01 ENCOUNTER — Other Ambulatory Visit (HOSPITAL_COMMUNITY): Payer: Self-pay | Admitting: Internal Medicine

## 2020-11-01 ENCOUNTER — Ambulatory Visit (HOSPITAL_COMMUNITY): Payer: Medicare Other

## 2020-11-01 ENCOUNTER — Other Ambulatory Visit: Payer: Self-pay | Admitting: Internal Medicine

## 2020-11-01 ENCOUNTER — Ambulatory Visit (HOSPITAL_COMMUNITY)
Admission: RE | Admit: 2020-11-01 | Discharge: 2020-11-01 | Disposition: A | Discharge status: Home / Self Care | Payer: Medicare Other | Source: Ambulatory Visit | Attending: Internal Medicine | Admitting: Internal Medicine

## 2020-11-01 ENCOUNTER — Other Ambulatory Visit: Payer: Self-pay

## 2020-11-01 DIAGNOSIS — R931 Abnormal findings on diagnostic imaging of heart and coronary circulation: Secondary | ICD-10-CM

## 2020-11-01 DIAGNOSIS — R072 Precordial pain: Secondary | ICD-10-CM | POA: Diagnosis not present

## 2020-11-01 DIAGNOSIS — Z006 Encounter for examination for normal comparison and control in clinical research program: Secondary | ICD-10-CM

## 2020-11-01 MED ORDER — NITROGLYCERIN 0.4 MG SL SUBL
0.8000 mg | SUBLINGUAL_TABLET | Freq: Once | SUBLINGUAL | Status: AC
  Administered 2020-11-01: 0.8 mg via SUBLINGUAL

## 2020-11-01 MED ORDER — DILTIAZEM HCL 25 MG/5ML IV SOLN
INTRAVENOUS | Status: AC
  Filled 2020-11-01: qty 5

## 2020-11-01 MED ORDER — NITROGLYCERIN 0.4 MG SL SUBL
SUBLINGUAL_TABLET | SUBLINGUAL | Status: AC
  Filled 2020-11-01: qty 2

## 2020-11-01 MED ORDER — METOPROLOL TARTRATE 5 MG/5ML IV SOLN
INTRAVENOUS | Status: AC
  Filled 2020-11-01: qty 10

## 2020-11-01 MED ORDER — METOPROLOL TARTRATE 5 MG/5ML IV SOLN
5.0000 mg | INTRAVENOUS | Status: DC | PRN
  Administered 2020-11-01 (×2): 5 mg via INTRAVENOUS

## 2020-11-01 MED ORDER — DILTIAZEM HCL 25 MG/5ML IV SOLN
10.0000 mg | Freq: Once | INTRAVENOUS | Status: AC
  Administered 2020-11-01: 10 mg via INTRAVENOUS

## 2020-11-01 MED ORDER — IOHEXOL 350 MG/ML SOLN
90.0000 mL | Freq: Once | INTRAVENOUS | Status: AC | PRN
  Administered 2020-11-01: 90 mL via INTRAVENOUS

## 2020-11-01 NOTE — Research (Signed)
IDENTIFY Informed Consent                  Subject Name: Tamara [name]   Subject met inclusion and exclusion criteria.  The informed consent form, study requirements and expectations were reviewed with the subject and questions and concerns were addressed prior to the signing of the consent form.  The subject verbalized understanding of the trial requirements.  The subject agreed to participate in the IDENTIFY trial and signed the informed consent at 11:25AM on 11/01/20.  The informed consent was obtained prior to performance of any protocol-specific procedures for the subject.  A copy of the signed informed consent was given to the subject and a copy was placed in the subject's medical record.     , Research Assistant  

## 2020-11-02 ENCOUNTER — Encounter: Payer: Self-pay | Admitting: Family Medicine

## 2020-11-02 DIAGNOSIS — R931 Abnormal findings on diagnostic imaging of heart and coronary circulation: Secondary | ICD-10-CM | POA: Diagnosis not present

## 2020-11-02 DIAGNOSIS — R072 Precordial pain: Secondary | ICD-10-CM | POA: Diagnosis not present

## 2020-11-03 DIAGNOSIS — M5032 Other cervical disc degeneration, mid-cervical region, unspecified level: Secondary | ICD-10-CM | POA: Diagnosis not present

## 2020-11-03 DIAGNOSIS — M9901 Segmental and somatic dysfunction of cervical region: Secondary | ICD-10-CM | POA: Diagnosis not present

## 2020-11-03 DIAGNOSIS — M5415 Radiculopathy, thoracolumbar region: Secondary | ICD-10-CM | POA: Diagnosis not present

## 2020-11-03 DIAGNOSIS — M9902 Segmental and somatic dysfunction of thoracic region: Secondary | ICD-10-CM | POA: Diagnosis not present

## 2020-11-03 DIAGNOSIS — M9903 Segmental and somatic dysfunction of lumbar region: Secondary | ICD-10-CM | POA: Diagnosis not present

## 2020-11-03 DIAGNOSIS — M5417 Radiculopathy, lumbosacral region: Secondary | ICD-10-CM | POA: Diagnosis not present

## 2020-11-04 DIAGNOSIS — E782 Mixed hyperlipidemia: Secondary | ICD-10-CM | POA: Diagnosis not present

## 2020-11-07 NOTE — Telephone Encounter (Signed)
This RN called patient at [phone] to discuss pt's MyChart message, this RN explained that although the result was released to MyChart, no result note had been documented in the chart yet. This RN let patient know that once a result note was published she would be contacted with the result and plan. Pt verbalized understanding, all questions and concerns addressed at this time.

## 2020-11-09 DIAGNOSIS — E782 Mixed hyperlipidemia: Secondary | ICD-10-CM | POA: Diagnosis not present

## 2020-11-09 DIAGNOSIS — M9903 Segmental and somatic dysfunction of lumbar region: Secondary | ICD-10-CM | POA: Diagnosis not present

## 2020-11-09 DIAGNOSIS — M5417 Radiculopathy, lumbosacral region: Secondary | ICD-10-CM | POA: Diagnosis not present

## 2020-11-09 DIAGNOSIS — M9901 Segmental and somatic dysfunction of cervical region: Secondary | ICD-10-CM | POA: Diagnosis not present

## 2020-11-09 DIAGNOSIS — M9902 Segmental and somatic dysfunction of thoracic region: Secondary | ICD-10-CM | POA: Diagnosis not present

## 2020-11-09 DIAGNOSIS — M5032 Other cervical disc degeneration, mid-cervical region, unspecified level: Secondary | ICD-10-CM | POA: Diagnosis not present

## 2020-11-09 DIAGNOSIS — M5415 Radiculopathy, thoracolumbar region: Secondary | ICD-10-CM | POA: Diagnosis not present

## 2020-11-11 DIAGNOSIS — M9903 Segmental and somatic dysfunction of lumbar region: Secondary | ICD-10-CM | POA: Diagnosis not present

## 2020-11-11 DIAGNOSIS — M9901 Segmental and somatic dysfunction of cervical region: Secondary | ICD-10-CM | POA: Diagnosis not present

## 2020-11-11 DIAGNOSIS — M5032 Other cervical disc degeneration, mid-cervical region, unspecified level: Secondary | ICD-10-CM | POA: Diagnosis not present

## 2020-11-11 DIAGNOSIS — M5415 Radiculopathy, thoracolumbar region: Secondary | ICD-10-CM | POA: Diagnosis not present

## 2020-11-11 DIAGNOSIS — M5417 Radiculopathy, lumbosacral region: Secondary | ICD-10-CM | POA: Diagnosis not present

## 2020-11-11 DIAGNOSIS — M9902 Segmental and somatic dysfunction of thoracic region: Secondary | ICD-10-CM | POA: Diagnosis not present

## 2020-11-14 DIAGNOSIS — F4310 Post-traumatic stress disorder, unspecified: Secondary | ICD-10-CM | POA: Diagnosis not present

## 2020-11-16 ENCOUNTER — Ambulatory Visit: Payer: Medicare Other

## 2020-11-16 ENCOUNTER — Telehealth: Payer: Self-pay

## 2020-11-16 DIAGNOSIS — M9902 Segmental and somatic dysfunction of thoracic region: Secondary | ICD-10-CM | POA: Diagnosis not present

## 2020-11-16 DIAGNOSIS — M9901 Segmental and somatic dysfunction of cervical region: Secondary | ICD-10-CM | POA: Diagnosis not present

## 2020-11-16 DIAGNOSIS — M5415 Radiculopathy, thoracolumbar region: Secondary | ICD-10-CM | POA: Diagnosis not present

## 2020-11-16 DIAGNOSIS — M9903 Segmental and somatic dysfunction of lumbar region: Secondary | ICD-10-CM | POA: Diagnosis not present

## 2020-11-16 DIAGNOSIS — M5032 Other cervical disc degeneration, mid-cervical region, unspecified level: Secondary | ICD-10-CM | POA: Diagnosis not present

## 2020-11-16 DIAGNOSIS — M5417 Radiculopathy, lumbosacral region: Secondary | ICD-10-CM | POA: Diagnosis not present

## 2020-11-16 NOTE — Telephone Encounter (Signed)
Spoke with pt on the phone regarding Coronary CT and FFR results. The patient has been notified of the result and verbalized understanding.  All questions (if any) were answered.  Explained to pt that risk factor management would be high priority. Pt states that she was recently started on a new medication to help with her cholesterol levels call gemfibrozil 300mg  twice daily. Pt states she has been taking this medication for about a month and will do follow up labs with her PCP in about a month to check her cholesterol levels. Pt will send a copy to our office when that takes place.   Medication added to pt list.  Beatrix Fetters, RN 11/16/2020 2:23 PM

## 2020-11-22 DIAGNOSIS — L600 Ingrowing nail: Secondary | ICD-10-CM | POA: Diagnosis not present

## 2020-11-22 DIAGNOSIS — B351 Tinea unguium: Secondary | ICD-10-CM | POA: Diagnosis not present

## 2020-11-22 DIAGNOSIS — F4310 Post-traumatic stress disorder, unspecified: Secondary | ICD-10-CM | POA: Diagnosis not present

## 2020-11-23 DIAGNOSIS — E782 Mixed hyperlipidemia: Secondary | ICD-10-CM | POA: Diagnosis not present

## 2020-12-07 DIAGNOSIS — E559 Vitamin D deficiency, unspecified: Secondary | ICD-10-CM | POA: Diagnosis not present

## 2020-12-07 DIAGNOSIS — R5383 Other fatigue: Secondary | ICD-10-CM | POA: Diagnosis not present

## 2020-12-07 DIAGNOSIS — R7989 Other specified abnormal findings of blood chemistry: Secondary | ICD-10-CM | POA: Diagnosis not present

## 2020-12-07 DIAGNOSIS — I7090 Unspecified atherosclerosis: Secondary | ICD-10-CM | POA: Diagnosis not present

## 2020-12-07 DIAGNOSIS — E7210 Disorders of sulfur-bearing amino-acid metabolism, unspecified: Secondary | ICD-10-CM | POA: Diagnosis not present

## 2020-12-07 DIAGNOSIS — K5150 Left sided colitis without complications: Secondary | ICD-10-CM | POA: Diagnosis not present

## 2020-12-07 DIAGNOSIS — E782 Mixed hyperlipidemia: Secondary | ICD-10-CM | POA: Diagnosis not present

## 2020-12-12 DIAGNOSIS — E782 Mixed hyperlipidemia: Secondary | ICD-10-CM | POA: Diagnosis not present

## 2020-12-13 ENCOUNTER — Emergency Department (HOSPITAL_BASED_OUTPATIENT_CLINIC_OR_DEPARTMENT_OTHER)
Admission: EM | Admit: 2020-12-13 | Discharge: 2020-12-13 | Disposition: A | Discharge status: Home / Self Care | Payer: Medicare Other | Attending: Emergency Medicine | Admitting: Emergency Medicine

## 2020-12-13 ENCOUNTER — Encounter (HOSPITAL_BASED_OUTPATIENT_CLINIC_OR_DEPARTMENT_OTHER): Payer: Self-pay | Admitting: *Deleted

## 2020-12-13 ENCOUNTER — Other Ambulatory Visit: Payer: Self-pay

## 2020-12-13 ENCOUNTER — Emergency Department (HOSPITAL_BASED_OUTPATIENT_CLINIC_OR_DEPARTMENT_OTHER): Payer: Medicare Other

## 2020-12-13 DIAGNOSIS — F4310 Post-traumatic stress disorder, unspecified: Secondary | ICD-10-CM | POA: Diagnosis not present

## 2020-12-13 DIAGNOSIS — M25552 Pain in left hip: Secondary | ICD-10-CM | POA: Diagnosis not present

## 2020-12-13 DIAGNOSIS — M545 Low back pain, unspecified: Secondary | ICD-10-CM | POA: Insufficient documentation

## 2020-12-13 DIAGNOSIS — Z7982 Long term (current) use of aspirin: Secondary | ICD-10-CM | POA: Diagnosis not present

## 2020-12-13 DIAGNOSIS — J45909 Unspecified asthma, uncomplicated: Secondary | ICD-10-CM | POA: Insufficient documentation

## 2020-12-13 DIAGNOSIS — W1839XA Other fall on same level, initial encounter: Secondary | ICD-10-CM | POA: Insufficient documentation

## 2020-12-13 DIAGNOSIS — I129 Hypertensive chronic kidney disease with stage 1 through stage 4 chronic kidney disease, or unspecified chronic kidney disease: Secondary | ICD-10-CM | POA: Insufficient documentation

## 2020-12-13 DIAGNOSIS — N183 Chronic kidney disease, stage 3 unspecified: Secondary | ICD-10-CM | POA: Insufficient documentation

## 2020-12-13 DIAGNOSIS — W19XXXA Unspecified fall, initial encounter: Secondary | ICD-10-CM

## 2020-12-13 DIAGNOSIS — Z79899 Other long term (current) drug therapy: Secondary | ICD-10-CM | POA: Insufficient documentation

## 2020-12-13 MED ORDER — IBUPROFEN 400 MG PO TABS
600.0000 mg | ORAL_TABLET | Freq: Once | ORAL | Status: AC
  Administered 2020-12-13: 600 mg via ORAL
  Filled 2020-12-13: qty 1

## 2020-12-13 MED ORDER — CYCLOBENZAPRINE HCL 10 MG PO TABS: 10.0000 mg | ORAL_TABLET | Freq: Every evening | ORAL | 0 refills | Status: AC | PRN

## 2020-12-13 MED ORDER — ACETAMINOPHEN 325 MG PO TABS
650.0000 mg | ORAL_TABLET | Freq: Once | ORAL | Status: AC
  Administered 2020-12-13: 650 mg via ORAL
  Filled 2020-12-13: qty 2

## 2020-12-13 NOTE — Discharge Instructions (Addendum)
I am prescribing you a strong muscle relaxer called flexeril. Please only take this medication once in the evening with dinner. This medication can make you quite drowsy. Do not mix it with alcohol. Do not drive a vehicle after taking it.   Please continue to monitor your symptoms closely.  I would recommend following up with your orthopedist, physical therapist, as well as your regular doctor as needed based on your symptoms.  If you develop any new or worsening symptoms please come back to the emergency department.  It was a pleasure to meet you.

## 2020-12-13 NOTE — ED Triage Notes (Signed)
She lost her balance and stumbled back 3' before falling on her back. C.o back and left hip pain. She is ambulatory.

## 2020-12-13 NOTE — ED Notes (Signed)
Patient transported to X-ray 

## 2020-12-13 NOTE — ED Provider Notes (Signed)
[facility]ERGENCY DEPARTMENT Provider Note   CSN: [id number] Arrival date & time: 12/13/20  1227     History Chief Complaint  Patient presents with   Fall   Back Pain    Tamara [name] is a 60 y.o. female.  HPI Patient is a 60 year old female with a medical history as noted below.  She presents to the emergency department today due to a fall.  She states that she was pulling a sign from a light pole and fell backwards landing on her buttocks.  She reports pain to her low back as well as her left hip.  She states that she is able to ambulate but this worsens her pain.  No numbness or weakness.  No bowel or bladder incontinence.  No head trauma or LOC.  She is not anticoagulated.    Past Medical History:  Diagnosis Date   Allergy    Anemia    Anxiety    Asthma    Depression    Diverticulitis    GERD (gastroesophageal reflux disease)    History of short term memory loss    HTN (hypertension)    Hyperlipemia    PTSD (post-traumatic stress disorder)    Shock therapy as cause of abnormal reaction of patient or of later complication without mention of misadventure at time of procedure     Patient Active Problem List   Diagnosis Date Noted   Atypical chest pain 10/21/2020   Elevated homocysteine 08/05/2020   Diverticulitis large intestine w/o perforation or abscess w/o bleeding 04/20/2019   Abscess    PTSD (post-traumatic stress disorder) 07/12/2018   Diverticulitis of colon 03/15/2018   CKD (chronic kidney disease), stage III ([place]) 03/15/2018   Sepsis ([place]) 03/14/2018   Diverticulosis of colon without hemorrhage 11/09/2016   Multinodular non-toxic goiter 02/16/2015   Migraine 11/05/2013   ASTHMA 08/01/2010   Hyperlipidemia 09/17/2007   OBESITY 09/17/2007   Anxiety 09/17/2007   Depression 09/17/2007   Essential hypertension 09/17/2007   GERD 09/17/2007    Past Surgical History:  Procedure Laterality Date   ABDOMINAL HYSTERECTOMY     partial    CHOLECYSTECTOMY     COLECTOMY     COLONOSCOPY     IR RADIOLOGIST EVAL & MGMT  07/24/2018   LAPAROSCOPIC SIGMOID COLECTOMY N/A 04/20/2019   Procedure: LAPAROSCOPIC SIGMOID COLECTOMY;  Surgeon: Alphonsa Overall, MD;  Location: [facility];  Service: General;  Laterality: N/A;   OTHER SURGICAL HISTORY     electric shock therapy   TUBAL LIGATION     UPPER GASTROINTESTINAL ENDOSCOPY     UTERINE FIBROID SURGERY     did a c-section cut     OB History   No obstetric history on file.     Family History  Problem Relation Age of Onset   Arrhythmia Father    Stroke Father    Hyperlipidemia Father    Heart disease Father    Hypertension Father    COPD Father    Heart failure Father    Coronary artery disease Maternal Grandfather 40       Died 58   Heart disease Maternal Grandfather    Hypertension Maternal Grandfather    Cancer Mother        bone marrow   Hyperlipidemia Mother    Hypertension Mother    Hearing loss Paternal Grandmother    Hearing loss Paternal Grandfather    Diabetes Paternal Grandfather    Diabetes Daughter    Colon cancer Paternal  Uncle    Hyperlipidemia Brother    Hyperlipidemia Maternal Grandmother    Esophageal cancer Neg Hx    Rectal cancer Neg Hx    Stomach cancer Neg Hx     Social History   Tobacco Use   Smoking status: Never   Smokeless tobacco: Never  Vaping Use   Vaping Use: Never used  Substance Use Topics   Alcohol use: No    Alcohol/week: 0.0 standard drinks   Drug use: No    Home Medications Prior to Admission medications   Medication Sig Start Date End Date Taking? Authorizing Provider  cyclobenzaprine (FLEXERIL) 10 MG tablet Take 1 tablet (10 mg total) by mouth at bedtime as needed for muscle spasms. 12/13/20  Yes Rayna Sexton, PA-C  albuterol (PROVENTIL HFA;VENTOLIN HFA) 108 (90 Base) MCG/ACT inhaler Inhale 2 puffs into the lungs every 6 (six) hours as needed for wheezing or shortness of breath. 07/05/15   Leandrew Koyanagi, MD   ALPRAZolam (XANAX XR) 1 MG 24 hr tablet Take 2 mg by mouth at bedtime.    [provider]  ALPRAZolam Duanne Moron) 1 MG tablet Take 0.5-1 mg by mouth 2 (two) times daily as needed for anxiety.    [provider]  aspirin 81 MG EC tablet     [provider]  Cholecalciferol (VITAMIN D3) 50000 UNITS CAPS Take 50,000 Units by mouth once a week. Patient taking differently: Take 50,000 Units by mouth once a week. 1 Tablet Weekly 09/24/14   [name], MD  Dexlansoprazole 30 MG capsule Takes every third day of the week. 08/01/20   [provider]  ezetimibe (ZETIA) 10 MG tablet Take 1 tablet (10 mg total) by mouth daily. 08/05/20 07/31/21  Hilts, Legrand Como, MD  gemfibrozil (LOPID) 600 MG tablet Take 300 mg by mouth 2 (two) times daily before a meal. 10/17/20   [provider]  losartan (COZAAR) 100 MG tablet Take 1 tablet (100 mg total) by mouth daily. 08/05/20   Hilts, Michael, MD  LYSINE PO Take 1 tablet by mouth daily as needed. skin    [provider]  metoprolol tartrate (LOPRESSOR) 100 MG tablet Take 1 tablet (100 mg total) by mouth once for 1 dose. 10/21/20 10/21/20  Lorretta Harp, MD  Oxcarbazepine (TRILEPTAL) 300 MG tablet Take 300 mg by mouth daily.    [provider]  predniSONE (DELTASONE) 50 MG tablet Take 1 50mg  tablet 13 hours prior to procedure, 7 hours prior to procedure, and 1 hour prior to procedure. 10/21/20   Lorretta Harp, MD  temazepam (RESTORIL) 15 MG capsule Take 15 mg by mouth at bedtime.  01/16/17   [provider]  temazepam (RESTORIL) 30 MG capsule Take 30 mg by mouth at bedtime.    [provider]  Vilazodone HCl (VIIBRYD) 10 MG TABS Take 10 mg by mouth daily.    [provider]  Vilazodone HCl 20 MG TABS Take 20 mg by mouth daily.    [provider]    Allergies    Darvon [propoxyphene], Gabapentin, Ivp dye [iodinated diagnostic agents], Levaquin [levofloxacin], Morphine and  related, Penicillins, Propoxyphene hcl, Rocephin [ceftriaxone], Seroquel [quetiapine fumerate], Statins, Sulfonamide derivatives, Adhesive [tape], Dicyclomine, Fentanyl, Other, Oxycodone, Oxycontin [oxycodone hcl], Robaxin [methocarbamol], and Benadryl [diphenhydramine]  Review of Systems   Review of Systems  All other systems reviewed and are negative. Ten systems reviewed and are negative for acute change, except as noted in the HPI.   Physical Exam Updated Vital  Signs BP 132/83 (BP Location: Right Arm)   Pulse 90   Temp 97.7 F (36.5 C) (Oral)   Resp 18   Ht 5\' 2"  (1.575 m)   Wt 90.7 kg   SpO2 98%   BMI 36.57 kg/m   Physical Exam Vitals and nursing note reviewed.  Constitutional:      General: She is not in acute distress.    Appearance: Normal appearance. She is well-developed.  HENT:     Head: Normocephalic and atraumatic.     Right Ear: External ear normal.     Left Ear: External ear normal.     Mouth/Throat:     Pharynx: Oropharynx is clear.  Eyes:     General: No scleral icterus.       Right eye: No discharge.        Left eye: No discharge.     Conjunctiva/sclera: Conjunctivae normal.  Neck:     Trachea: No tracheal deviation.  Cardiovascular:     Rate and Rhythm: Normal rate.  Pulmonary:     Effort: Pulmonary effort is normal. No respiratory distress.     Breath sounds: No stridor.  Abdominal:     General: Abdomen is flat. There is no distension.     Palpations: Abdomen is soft.     Tenderness: There is no abdominal tenderness.  Musculoskeletal:        General: Tenderness present. No swelling or deformity.     Cervical back: Neck supple.     Comments: Moderate TTP noted to the left lateral hip.  No overlying skin changes or bruising noted.  Difficult to assess for swelling due to body habitus.  Unable to assess range of motion of the left hip due to patient's pain.  Additional moderate TTP noted to the bilateral SI joints.  Additional mild TTP noted along  the midline lumbar spine diffusely.  No overlying skin changes.  No tenderness appreciated to the bilateral knees or ankles.  Full range of motion of the knees and ankles.  Skin:    General: Skin is warm and dry.     Findings: No rash.  Neurological:     General: No focal deficit present.     Mental Status: She is alert and oriented to person, place, and time.     Cranial Nerves: Cranial nerve deficit: no gross deficits.     Comments: Distal sensation intact in lower extremities.  No gross deficits.  2+ pedal pulses.  Patient is ambulatory.    ED Results / Procedures / Treatments   Labs (all labs ordered are listed, but only abnormal results are displayed) Labs Reviewed - No data to display  EKG None  Radiology DG Lumbar Spine Complete  Result Date: 12/13/2020 CLINICAL DATA:  Pain following fall EXAM: LUMBAR SPINE - COMPLETE 4+ VIEW COMPARISON:  February 23, 2015 FINDINGS: Frontal, lateral, spot lumbosacral lateral, and bilateral oblique views were obtained. There are 5 non-rib-bearing lumbar type vertebral bodies. There is slight lumbar dextroscoliosis. No acute fracture is evident. There is bony overgrowth along the anterior aspect of the L4 vertebral body which was not present previously that may represent residua of prior trauma. No evidence spondylolisthesis. There is mild disc space narrowing at L4-5 and L5-S1. There is facet osteoarthritic change at L4-5 and L5-S1 bilaterally. There are scattered foci of aortic atherosclerosis. IMPRESSION: No acute fracture or spondylolisthesis. Bony overgrowth along the anterior aspect at L4 appears benign and may represent residua of prior trauma. This finding was not  present on prior study. Osteoarthritic change noted at L4-5 and L5-S1. Aortic Atherosclerosis (ICD10-I70.0). Electronically Signed   By: Lowella Grip III M.D.   On: 12/13/2020 13:43   DG Hip Unilat W or Wo Pelvis 2-3 Views Left  Result Date: 12/13/2020 CLINICAL DATA:  Pain  following fall EXAM: DG HIP (WITH OR WITHOUT PELVIS) 2-3V LEFT COMPARISON:  None. FINDINGS: Frontal pelvis as well as frontal and lateral left hip images were obtained. No fracture or dislocation. There is slight symmetric narrowing of each hip joint. No erosive change. Sacroiliac joints appear normal bilaterally. IMPRESSION: Slight symmetric narrowing of each hip joint. No fracture or dislocation. Electronically Signed   By: Lowella Grip III M.D.   On: 12/13/2020 13:40    Procedures Procedures   Medications Ordered in ED Medications  ibuprofen (ADVIL) tablet 600 mg (600 mg Oral Given 12/13/20 1338)  acetaminophen (TYLENOL) tablet 650 mg (650 mg Oral Given 12/13/20 1338)   ED Course  I have reviewed the triage vital signs and the nursing notes.  Pertinent labs & imaging results that were available during my care of the patient were reviewed by me and considered in my medical decision making (see chart for details).    MDM Rules/Calculators/A&P                          Pt is a 60 y.o. female who presents to the emergency department due to left hip pain as well as low back pain after a fall that occurred just prior to arrival.  Imaging: X-ray of the left hip shows slight symmetric narrowing of each hip joint.  No fracture or dislocation. X-ray of the lumbar spine shows no acute fracture or spondylolisthesis.  Bony overgrowth along the anterior aspect of L4 appears benign and may represent residua prior trauma.  Osteoarthritic changes noted at L4-5 and L5-S1.  I, Rayna Sexton, PA-C, personally reviewed and evaluated these images and lab results as part of my medical decision-making.  Imaging today is reassuring.  Neurovascularly intact in the lower extremities.  No red flags.  Patient given Tylenol, ibuprofen, as well as an ice pack while in the emergency department.  Imaging results discussed with the patient and she is relieved.  Will additionally prescribe a muscle relaxer for  continued management of her symptoms.  We discussed safety regarding this medication.  She is followed by physical therapy as well as orthopedics.  She states she will follow-up with them as needed based on her symptoms.  Urged her to return to the emergency department if her symptoms should worsen.  Feel that she is stable for discharge at this time and she is agreeable.  Her questions were answered and she was amicable at the time of discharge.  Note: Portions of this report may have been transcribed using voice recognition software. Every effort was made to ensure accuracy; however, inadvertent computerized transcription errors may be present.   Final Clinical Impression(s) / ED Diagnoses Final diagnoses:  Fall, initial encounter  Left hip pain  Acute bilateral low back pain without sciatica    Rx / DC Orders ED Discharge Orders          Ordered    cyclobenzaprine (FLEXERIL) 10 MG tablet  At bedtime PRN        12/13/20 1426             Rayna Sexton, PA-C 12/13/20 [place], [place], DO 12/13/20 1443

## 2020-12-15 ENCOUNTER — Telehealth: Payer: Self-pay

## 2020-12-15 ENCOUNTER — Encounter: Payer: Self-pay | Admitting: Family Medicine

## 2020-12-15 NOTE — Telephone Encounter (Signed)
Ok to work in.

## 2020-12-15 NOTE — Telephone Encounter (Signed)
Called pt and worked her in

## 2020-12-15 NOTE — Telephone Encounter (Signed)
Patient would like to be worked into the schedule for tomorrow.  Patient had a recent fall and was seen at [facility] ED.  Stated that she is not able to walk and is using a walker when going to the restroom.  CB# [phone].  Please advise.  Thank you.

## 2020-12-16 ENCOUNTER — Other Ambulatory Visit: Payer: Self-pay

## 2020-12-16 ENCOUNTER — Encounter: Payer: Self-pay | Admitting: Family Medicine

## 2020-12-16 ENCOUNTER — Ambulatory Visit (INDEPENDENT_AMBULATORY_CARE_PROVIDER_SITE_OTHER): Payer: Medicare Other | Admitting: Family Medicine

## 2020-12-16 DIAGNOSIS — S300XXA Contusion of lower back and pelvis, initial encounter: Secondary | ICD-10-CM

## 2020-12-16 MED ORDER — GEMFIBROZIL 600 MG PO TABS: 600.0000 mg | ORAL_TABLET | Freq: Two times a day (BID) | ORAL | 1 refills | Status: DC

## 2020-12-16 MED ORDER — GEMFIBROZIL 600 MG PO TABS: 600.0000 mg | ORAL_TABLET | Freq: Two times a day (BID) | ORAL | 1 refills | Status: AC

## 2020-12-16 MED ORDER — HYDROMORPHONE HCL 2 MG PO TABS: 1.0000 mg | ORAL_TABLET | Freq: Four times a day (QID) | ORAL | 0 refills | Status: DC | PRN

## 2020-12-16 NOTE — Progress Notes (Signed)
Fell Tuesday, having severe back pain; can only lay not sit up

## 2020-12-16 NOTE — Addendum Note (Signed)
Addended by: Hortencia Pilar on: 12/16/2020 01:01 PM   Modules accepted: Orders

## 2020-12-16 NOTE — Progress Notes (Signed)
Office Visit Note   Patient: Tamara [name]           Date of Birth: 1961/06/07           MRN: [id number] Visit Date: 12/16/2020 Requested by: Jilda Panda, MD [address] [place],  [place] [address] PCP: Jilda Panda, MD  Subjective: Chief Complaint  Patient presents with   Lower Back - Pain    HPI: She is here with low back pain.  Recently she was pulling a sign off a post, lost her balance and fell hard backward.  She had immediate severe pain in her lumbosacral spine.  She went to urgent care where x-rays were obtained.  She was given Flexeril for her pain.  The pain remains severe, 9/10.  No bowel or bladder dysfunction, no radicular symptoms.               ROS:   All other systems were reviewed and are negative.  Objective: Vital Signs: There were no vitals taken for this visit.  Physical Exam:  General:  Alert and oriented, in no acute distress. Pulm:  Breathing unlabored. Psy:  Normal mood, congruent affect.  Low back: She is severely tender in the lumbosacral area in the midline to bony palpation.    Imaging: Recent x-rays viewed on computer compared to 2016 x-rays show a probable acute compression deformity of L4 with loss of height of about 4 mm.    Assessment & Plan: Recently status post fall with lumbar contusion and suspected L4 compression fracture - MRI to further evaluate.       -she has multiple drug allergies but has tolerated Dilaudid in the past.  We will give her a small amount of this.     Procedures: No procedures performed        PMFS History: Patient Active Problem List   Diagnosis Date Noted   Atypical chest pain 10/21/2020   Elevated homocysteine 08/05/2020   Diverticulitis large intestine w/o perforation or abscess w/o bleeding 04/20/2019   Abscess    PTSD (post-traumatic stress disorder) 07/12/2018   Diverticulitis of colon 03/15/2018   CKD (chronic kidney disease), stage III ([place]) 03/15/2018   Sepsis ([place]) 03/14/2018    Diverticulosis of colon without hemorrhage 11/09/2016   Multinodular non-toxic goiter 02/16/2015   Migraine 11/05/2013   ASTHMA 08/01/2010   Hyperlipidemia 09/17/2007   OBESITY 09/17/2007   Anxiety 09/17/2007   Depression 09/17/2007   Essential hypertension 09/17/2007   GERD 09/17/2007   Past Medical History:  Diagnosis Date   Allergy    Anemia    Anxiety    Asthma    Depression    Diverticulitis    GERD (gastroesophageal reflux disease)    History of short term memory loss    HTN (hypertension)    Hyperlipemia    PTSD (post-traumatic stress disorder)    Shock therapy as cause of abnormal reaction of patient or of later complication without mention of misadventure at time of procedure     Family History  Problem Relation Age of Onset   Arrhythmia Father    Stroke Father    Hyperlipidemia Father    Heart disease Father    Hypertension Father    COPD Father    Heart failure Father    Coronary artery disease Maternal Grandfather 40       Died 58   Heart disease Maternal Grandfather    Hypertension Maternal Grandfather    Cancer Mother  bone marrow   Hyperlipidemia Mother    Hypertension Mother    Hearing loss Paternal Grandmother    Hearing loss Paternal Grandfather    Diabetes Paternal Grandfather    Diabetes Daughter    Colon cancer Paternal Uncle    Hyperlipidemia Brother    Hyperlipidemia Maternal Grandmother    Esophageal cancer Neg Hx    Rectal cancer Neg Hx    Stomach cancer Neg Hx     Past Surgical History:  Procedure Laterality Date   ABDOMINAL HYSTERECTOMY     partial   CHOLECYSTECTOMY     COLECTOMY     COLONOSCOPY     IR RADIOLOGIST EVAL & MGMT  07/24/2018   LAPAROSCOPIC SIGMOID COLECTOMY N/A 04/20/2019   Procedure: LAPAROSCOPIC SIGMOID COLECTOMY;  Surgeon: Alphonsa Overall, MD;  Location: [facility];  Service: General;  Laterality: N/A;   OTHER SURGICAL HISTORY     electric shock therapy   TUBAL LIGATION     UPPER GASTROINTESTINAL ENDOSCOPY      UTERINE FIBROID SURGERY     did a c-section cut   Social History   Occupational History    Employer: UNEMPLOYED    Comment: Disabled  Tobacco Use   Smoking status: Never   Smokeless tobacco: Never  Vaping Use   Vaping Use: Never used  Substance and Sexual Activity   Alcohol use: No    Alcohol/week: 0.0 standard drinks   Drug use: No   Sexual activity: Not on file

## 2020-12-19 DIAGNOSIS — F4310 Post-traumatic stress disorder, unspecified: Secondary | ICD-10-CM | POA: Diagnosis not present

## 2020-12-20 ENCOUNTER — Other Ambulatory Visit: Payer: Self-pay

## 2020-12-20 ENCOUNTER — Ambulatory Visit
Admission: RE | Admit: 2020-12-20 | Discharge: 2020-12-20 | Disposition: A | Discharge status: Home / Self Care | Payer: Medicare Other | Source: Ambulatory Visit | Attending: Family Medicine | Admitting: Family Medicine

## 2020-12-20 ENCOUNTER — Telehealth: Payer: Self-pay | Admitting: Family Medicine

## 2020-12-20 DIAGNOSIS — S300XXA Contusion of lower back and pelvis, initial encounter: Secondary | ICD-10-CM

## 2020-12-20 DIAGNOSIS — M48061 Spinal stenosis, lumbar region without neurogenic claudication: Secondary | ICD-10-CM | POA: Diagnosis not present

## 2020-12-20 DIAGNOSIS — M545 Low back pain, unspecified: Secondary | ICD-10-CM | POA: Diagnosis not present

## 2020-12-20 NOTE — Telephone Encounter (Signed)
MRI confirms an L4 compression fracture.  Good news is that there's no nerve impingement.  These generally take 2-4 months to heal.  Back brace might provide some pain relief, but is not required.  Should avoid lifting anything more than about 5-10 pounds for at least 6 weeks.  Are you on vitamin D?  And K2 and magnesium?

## 2020-12-22 ENCOUNTER — Other Ambulatory Visit: Payer: Medicare Other

## 2020-12-26 DIAGNOSIS — F329 Major depressive disorder, single episode, unspecified: Secondary | ICD-10-CM | POA: Diagnosis not present

## 2020-12-27 DIAGNOSIS — F329 Major depressive disorder, single episode, unspecified: Secondary | ICD-10-CM | POA: Diagnosis not present

## 2020-12-29 ENCOUNTER — Encounter: Payer: Self-pay | Admitting: Family Medicine

## 2021-01-03 DIAGNOSIS — F329 Major depressive disorder, single episode, unspecified: Secondary | ICD-10-CM | POA: Diagnosis not present

## 2021-01-04 DIAGNOSIS — H2513 Age-related nuclear cataract, bilateral: Secondary | ICD-10-CM | POA: Diagnosis not present

## 2021-01-04 DIAGNOSIS — H18413 Arcus senilis, bilateral: Secondary | ICD-10-CM | POA: Diagnosis not present

## 2021-01-04 DIAGNOSIS — H25043 Posterior subcapsular polar age-related cataract, bilateral: Secondary | ICD-10-CM | POA: Diagnosis not present

## 2021-01-04 DIAGNOSIS — H2512 Age-related nuclear cataract, left eye: Secondary | ICD-10-CM | POA: Diagnosis not present

## 2021-01-04 DIAGNOSIS — H25013 Cortical age-related cataract, bilateral: Secondary | ICD-10-CM | POA: Diagnosis not present

## 2021-01-10 DIAGNOSIS — F329 Major depressive disorder, single episode, unspecified: Secondary | ICD-10-CM | POA: Diagnosis not present

## 2021-01-18 DIAGNOSIS — E782 Mixed hyperlipidemia: Secondary | ICD-10-CM | POA: Diagnosis not present

## 2021-01-19 DIAGNOSIS — F329 Major depressive disorder, single episode, unspecified: Secondary | ICD-10-CM | POA: Diagnosis not present

## 2021-01-24 DIAGNOSIS — H25812 Combined forms of age-related cataract, left eye: Secondary | ICD-10-CM | POA: Diagnosis not present

## 2021-01-24 DIAGNOSIS — H2511 Age-related nuclear cataract, right eye: Secondary | ICD-10-CM | POA: Diagnosis not present

## 2021-01-24 DIAGNOSIS — H25011 Cortical age-related cataract, right eye: Secondary | ICD-10-CM | POA: Diagnosis not present

## 2021-01-24 DIAGNOSIS — H2512 Age-related nuclear cataract, left eye: Secondary | ICD-10-CM | POA: Diagnosis not present

## 2021-01-26 DIAGNOSIS — F4310 Post-traumatic stress disorder, unspecified: Secondary | ICD-10-CM | POA: Diagnosis not present

## 2021-01-30 ENCOUNTER — Telehealth: Payer: Self-pay | Admitting: *Deleted

## 2021-01-30 NOTE — Telephone Encounter (Signed)
I called patient for 90-day Identify study phone call. Patient is still having chest pain every 2-3 days especially when she is under stress. I reminded patient I would call her in May 2023 for 1-year follow-up.

## 2021-02-02 DIAGNOSIS — F329 Major depressive disorder, single episode, unspecified: Secondary | ICD-10-CM | POA: Diagnosis not present

## 2021-02-06 ENCOUNTER — Encounter: Payer: Self-pay | Admitting: Family Medicine

## 2021-02-06 ENCOUNTER — Ambulatory Visit (INDEPENDENT_AMBULATORY_CARE_PROVIDER_SITE_OTHER): Payer: Medicare Other | Admitting: Family Medicine

## 2021-02-06 ENCOUNTER — Other Ambulatory Visit: Payer: Self-pay

## 2021-02-06 DIAGNOSIS — S32040G Wedge compression fracture of fourth lumbar vertebra, subsequent encounter for fracture with delayed healing: Secondary | ICD-10-CM

## 2021-02-06 DIAGNOSIS — M25561 Pain in right knee: Secondary | ICD-10-CM | POA: Diagnosis not present

## 2021-02-06 NOTE — Progress Notes (Signed)
Office Visit Note   Patient: Tamara [name]           Date of Birth: 1960-10-17           MRN: [id number] Visit Date: 02/06/2021 Requested by: Jilda Panda, MD [address] [place],  [place] [address] PCP: Jilda Panda, MD  Subjective: Chief Complaint  Patient presents with   Right Knee - Pain    Painful, ? If it is compensating, states that she can feel things in it, states that she fell, and states that when it goes out it really hurts   Lower Back - Follow-up    Follow up on her fracture of the Lumbar 4 vertebrae, says that she really hurts on the right side    HPI: She is here with worsening low back pain.  She has an L4 compression fracture after falling.  Pain seems to be getting worse and she is now having pain radiating into the right leg and to the knee.  The knee sometimes feels swollen, as though it has nodules.  Today is a little bit better.               ROS:   All other systems were reviewed and are negative.  Objective: Vital Signs: There were no vitals taken for this visit.  Physical Exam:  General:  Alert and oriented, in no acute distress. Pulm:  Breathing unlabored. Psy:  Normal mood, congruent affect  Low back: She is still very tender to palpation over the L4 spinous process.  She also has tenderness in the paraspinous muscles. Right knee: No effusion today.  She has some pain with patellar compression.  Mild tenderness on the medial joint line.  Ligaments feel stable.    Imaging: No results found.  Assessment & Plan: Worsening low back pain with documented L4 compression fracture -New MRI to look for a disc protrusion. -If negative, then resume physical therapy with caution.  2.  Right knee pain -X-rays and MRI scan at [facility] imaging.     Procedures: No procedures performed        PMFS History: Patient Active Problem List   Diagnosis Date Noted   Atypical chest pain 10/21/2020   Elevated homocysteine 08/05/2020   Diverticulitis  large intestine w/o perforation or abscess w/o bleeding 04/20/2019   Abscess    PTSD (post-traumatic stress disorder) 07/12/2018   Diverticulitis of colon 03/15/2018   CKD (chronic kidney disease), stage III ([place]) 03/15/2018   Sepsis ([place]) 03/14/2018   Diverticulosis of colon without hemorrhage 11/09/2016   Multinodular non-toxic goiter 02/16/2015   Migraine 11/05/2013   ASTHMA 08/01/2010   Hyperlipidemia 09/17/2007   OBESITY 09/17/2007   Anxiety 09/17/2007   Depression 09/17/2007   Essential hypertension 09/17/2007   GERD 09/17/2007   Past Medical History:  Diagnosis Date   Allergy    Anemia    Anxiety    Asthma    Depression    Diverticulitis    GERD (gastroesophageal reflux disease)    History of short term memory loss    HTN (hypertension)    Hyperlipemia    PTSD (post-traumatic stress disorder)    Shock therapy as cause of abnormal reaction of patient or of later complication without mention of misadventure at time of procedure     Family History  Problem Relation Age of Onset   Arrhythmia Father    Stroke Father    Hyperlipidemia Father    Heart disease Father    Hypertension Father  COPD Father    Heart failure Father    Coronary artery disease Maternal Grandfather 40       Died 58   Heart disease Maternal Grandfather    Hypertension Maternal Grandfather    Cancer Mother        bone marrow   Hyperlipidemia Mother    Hypertension Mother    Hearing loss Paternal Grandmother    Hearing loss Paternal Grandfather    Diabetes Paternal Grandfather    Diabetes Daughter    Colon cancer Paternal Uncle    Hyperlipidemia Brother    Hyperlipidemia Maternal Grandmother    Esophageal cancer Neg Hx    Rectal cancer Neg Hx    Stomach cancer Neg Hx     Past Surgical History:  Procedure Laterality Date   ABDOMINAL HYSTERECTOMY     partial   CHOLECYSTECTOMY     COLECTOMY     COLONOSCOPY     IR RADIOLOGIST EVAL & MGMT  07/24/2018   LAPAROSCOPIC SIGMOID COLECTOMY  N/A 04/20/2019   Procedure: LAPAROSCOPIC SIGMOID COLECTOMY;  Surgeon: Alphonsa Overall, MD;  Location: [facility];  Service: General;  Laterality: N/A;   OTHER SURGICAL HISTORY     electric shock therapy   TUBAL LIGATION     UPPER GASTROINTESTINAL ENDOSCOPY     UTERINE FIBROID SURGERY     did a c-section cut   Social History   Occupational History    Employer: UNEMPLOYED    Comment: Disabled  Tobacco Use   Smoking status: Never   Smokeless tobacco: Never  Vaping Use   Vaping Use: Never used  Substance and Sexual Activity   Alcohol use: No    Alcohol/week: 0.0 standard drinks   Drug use: No   Sexual activity: Not on file

## 2021-02-07 DIAGNOSIS — H25811 Combined forms of age-related cataract, right eye: Secondary | ICD-10-CM | POA: Diagnosis not present

## 2021-02-07 DIAGNOSIS — H2511 Age-related nuclear cataract, right eye: Secondary | ICD-10-CM | POA: Diagnosis not present

## 2021-02-10 DIAGNOSIS — F4310 Post-traumatic stress disorder, unspecified: Secondary | ICD-10-CM | POA: Diagnosis not present

## 2021-02-14 DIAGNOSIS — F4323 Adjustment disorder with mixed anxiety and depressed mood: Secondary | ICD-10-CM | POA: Diagnosis not present

## 2021-02-15 ENCOUNTER — Encounter: Payer: Self-pay | Admitting: Family Medicine

## 2021-02-16 DIAGNOSIS — M5032 Other cervical disc degeneration, mid-cervical region, unspecified level: Secondary | ICD-10-CM | POA: Diagnosis not present

## 2021-02-16 DIAGNOSIS — M5414 Radiculopathy, thoracic region: Secondary | ICD-10-CM | POA: Diagnosis not present

## 2021-02-16 DIAGNOSIS — M9901 Segmental and somatic dysfunction of cervical region: Secondary | ICD-10-CM | POA: Diagnosis not present

## 2021-02-16 DIAGNOSIS — M9902 Segmental and somatic dysfunction of thoracic region: Secondary | ICD-10-CM | POA: Diagnosis not present

## 2021-02-17 ENCOUNTER — Ambulatory Visit
Admission: RE | Admit: 2021-02-17 | Discharge: 2021-02-17 | Disposition: A | Discharge status: Home / Self Care | Payer: Medicare Other | Source: Ambulatory Visit | Attending: Family Medicine | Admitting: Family Medicine

## 2021-02-17 DIAGNOSIS — M5126 Other intervertebral disc displacement, lumbar region: Secondary | ICD-10-CM | POA: Diagnosis not present

## 2021-02-17 DIAGNOSIS — M25561 Pain in right knee: Secondary | ICD-10-CM

## 2021-02-17 DIAGNOSIS — S32040D Wedge compression fracture of fourth lumbar vertebra, subsequent encounter for fracture with routine healing: Secondary | ICD-10-CM | POA: Diagnosis not present

## 2021-02-17 DIAGNOSIS — M5127 Other intervertebral disc displacement, lumbosacral region: Secondary | ICD-10-CM | POA: Diagnosis not present

## 2021-02-17 DIAGNOSIS — S32040G Wedge compression fracture of fourth lumbar vertebra, subsequent encounter for fracture with delayed healing: Secondary | ICD-10-CM

## 2021-02-17 DIAGNOSIS — M4807 Spinal stenosis, lumbosacral region: Secondary | ICD-10-CM | POA: Diagnosis not present

## 2021-02-18 ENCOUNTER — Encounter: Payer: Self-pay | Admitting: Family Medicine

## 2021-02-20 ENCOUNTER — Telehealth: Payer: Self-pay | Admitting: Family Medicine

## 2021-02-20 NOTE — Telephone Encounter (Signed)
Lumbar MRI scan reveals a healing L4 compression fracture.  There is no nerve impingement.  Right knee MRI scan reveals mild arthritis throughout the joint but no meniscus or ligament tears.

## 2021-02-22 ENCOUNTER — Other Ambulatory Visit: Payer: Medicare Other

## 2021-02-23 DIAGNOSIS — M5414 Radiculopathy, thoracic region: Secondary | ICD-10-CM | POA: Diagnosis not present

## 2021-02-23 DIAGNOSIS — M9902 Segmental and somatic dysfunction of thoracic region: Secondary | ICD-10-CM | POA: Diagnosis not present

## 2021-02-23 DIAGNOSIS — M9901 Segmental and somatic dysfunction of cervical region: Secondary | ICD-10-CM | POA: Diagnosis not present

## 2021-02-23 DIAGNOSIS — M5032 Other cervical disc degeneration, mid-cervical region, unspecified level: Secondary | ICD-10-CM | POA: Diagnosis not present

## 2021-02-27 DIAGNOSIS — F4310 Post-traumatic stress disorder, unspecified: Secondary | ICD-10-CM | POA: Diagnosis not present

## 2021-02-28 DIAGNOSIS — F4323 Adjustment disorder with mixed anxiety and depressed mood: Secondary | ICD-10-CM | POA: Diagnosis not present

## 2021-03-01 DIAGNOSIS — F4310 Post-traumatic stress disorder, unspecified: Secondary | ICD-10-CM | POA: Diagnosis not present

## 2021-03-02 DIAGNOSIS — M9902 Segmental and somatic dysfunction of thoracic region: Secondary | ICD-10-CM | POA: Diagnosis not present

## 2021-03-02 DIAGNOSIS — M5414 Radiculopathy, thoracic region: Secondary | ICD-10-CM | POA: Diagnosis not present

## 2021-03-02 DIAGNOSIS — M5032 Other cervical disc degeneration, mid-cervical region, unspecified level: Secondary | ICD-10-CM | POA: Diagnosis not present

## 2021-03-02 DIAGNOSIS — M9901 Segmental and somatic dysfunction of cervical region: Secondary | ICD-10-CM | POA: Diagnosis not present

## 2021-03-03 DIAGNOSIS — F4310 Post-traumatic stress disorder, unspecified: Secondary | ICD-10-CM | POA: Diagnosis not present

## 2021-03-07 DIAGNOSIS — F4310 Post-traumatic stress disorder, unspecified: Secondary | ICD-10-CM | POA: Diagnosis not present

## 2021-03-08 DIAGNOSIS — E782 Mixed hyperlipidemia: Secondary | ICD-10-CM | POA: Diagnosis not present

## 2021-03-09 DIAGNOSIS — F4310 Post-traumatic stress disorder, unspecified: Secondary | ICD-10-CM | POA: Diagnosis not present

## 2021-03-13 DIAGNOSIS — F4310 Post-traumatic stress disorder, unspecified: Secondary | ICD-10-CM | POA: Diagnosis not present

## 2021-03-15 DIAGNOSIS — F329 Major depressive disorder, single episode, unspecified: Secondary | ICD-10-CM | POA: Diagnosis not present

## 2021-03-16 DIAGNOSIS — M9902 Segmental and somatic dysfunction of thoracic region: Secondary | ICD-10-CM | POA: Diagnosis not present

## 2021-03-16 DIAGNOSIS — M5414 Radiculopathy, thoracic region: Secondary | ICD-10-CM | POA: Diagnosis not present

## 2021-03-16 DIAGNOSIS — M5032 Other cervical disc degeneration, mid-cervical region, unspecified level: Secondary | ICD-10-CM | POA: Diagnosis not present

## 2021-03-16 DIAGNOSIS — M9901 Segmental and somatic dysfunction of cervical region: Secondary | ICD-10-CM | POA: Diagnosis not present

## 2021-03-22 DIAGNOSIS — F4323 Adjustment disorder with mixed anxiety and depressed mood: Secondary | ICD-10-CM | POA: Diagnosis not present

## 2021-03-23 DIAGNOSIS — M9901 Segmental and somatic dysfunction of cervical region: Secondary | ICD-10-CM | POA: Diagnosis not present

## 2021-03-23 DIAGNOSIS — M9902 Segmental and somatic dysfunction of thoracic region: Secondary | ICD-10-CM | POA: Diagnosis not present

## 2021-03-23 DIAGNOSIS — M5414 Radiculopathy, thoracic region: Secondary | ICD-10-CM | POA: Diagnosis not present

## 2021-03-23 DIAGNOSIS — M5032 Other cervical disc degeneration, mid-cervical region, unspecified level: Secondary | ICD-10-CM | POA: Diagnosis not present

## 2021-03-24 DIAGNOSIS — I7090 Unspecified atherosclerosis: Secondary | ICD-10-CM | POA: Diagnosis not present

## 2021-03-24 DIAGNOSIS — K5150 Left sided colitis without complications: Secondary | ICD-10-CM | POA: Diagnosis not present

## 2021-03-24 DIAGNOSIS — E782 Mixed hyperlipidemia: Secondary | ICD-10-CM | POA: Diagnosis not present

## 2021-03-24 DIAGNOSIS — E7210 Disorders of sulfur-bearing amino-acid metabolism, unspecified: Secondary | ICD-10-CM | POA: Diagnosis not present

## 2021-03-24 DIAGNOSIS — R7989 Other specified abnormal findings of blood chemistry: Secondary | ICD-10-CM | POA: Diagnosis not present

## 2021-03-24 DIAGNOSIS — R5383 Other fatigue: Secondary | ICD-10-CM | POA: Diagnosis not present

## 2021-03-24 DIAGNOSIS — E559 Vitamin D deficiency, unspecified: Secondary | ICD-10-CM | POA: Diagnosis not present

## 2021-03-29 DIAGNOSIS — E782 Mixed hyperlipidemia: Secondary | ICD-10-CM | POA: Diagnosis not present

## 2021-04-07 DIAGNOSIS — S32040D Wedge compression fracture of fourth lumbar vertebra, subsequent encounter for fracture with routine healing: Secondary | ICD-10-CM | POA: Diagnosis not present

## 2021-04-10 DIAGNOSIS — M5032 Other cervical disc degeneration, mid-cervical region, unspecified level: Secondary | ICD-10-CM | POA: Diagnosis not present

## 2021-04-10 DIAGNOSIS — M9902 Segmental and somatic dysfunction of thoracic region: Secondary | ICD-10-CM | POA: Diagnosis not present

## 2021-04-10 DIAGNOSIS — M5414 Radiculopathy, thoracic region: Secondary | ICD-10-CM | POA: Diagnosis not present

## 2021-04-10 DIAGNOSIS — M9901 Segmental and somatic dysfunction of cervical region: Secondary | ICD-10-CM | POA: Diagnosis not present

## 2021-04-12 DIAGNOSIS — F4323 Adjustment disorder with mixed anxiety and depressed mood: Secondary | ICD-10-CM | POA: Diagnosis not present

## 2021-04-12 DIAGNOSIS — S32040D Wedge compression fracture of fourth lumbar vertebra, subsequent encounter for fracture with routine healing: Secondary | ICD-10-CM | POA: Diagnosis not present

## 2021-04-13 DIAGNOSIS — M9902 Segmental and somatic dysfunction of thoracic region: Secondary | ICD-10-CM | POA: Diagnosis not present

## 2021-04-13 DIAGNOSIS — M5032 Other cervical disc degeneration, mid-cervical region, unspecified level: Secondary | ICD-10-CM | POA: Diagnosis not present

## 2021-04-13 DIAGNOSIS — M5414 Radiculopathy, thoracic region: Secondary | ICD-10-CM | POA: Diagnosis not present

## 2021-04-13 DIAGNOSIS — M9901 Segmental and somatic dysfunction of cervical region: Secondary | ICD-10-CM | POA: Diagnosis not present

## 2021-04-14 DIAGNOSIS — S32040D Wedge compression fracture of fourth lumbar vertebra, subsequent encounter for fracture with routine healing: Secondary | ICD-10-CM | POA: Diagnosis not present

## 2021-04-17 DIAGNOSIS — S32040D Wedge compression fracture of fourth lumbar vertebra, subsequent encounter for fracture with routine healing: Secondary | ICD-10-CM | POA: Diagnosis not present

## 2021-04-18 DIAGNOSIS — M9902 Segmental and somatic dysfunction of thoracic region: Secondary | ICD-10-CM | POA: Diagnosis not present

## 2021-04-18 DIAGNOSIS — M9901 Segmental and somatic dysfunction of cervical region: Secondary | ICD-10-CM | POA: Diagnosis not present

## 2021-04-18 DIAGNOSIS — M5032 Other cervical disc degeneration, mid-cervical region, unspecified level: Secondary | ICD-10-CM | POA: Diagnosis not present

## 2021-04-18 DIAGNOSIS — M5414 Radiculopathy, thoracic region: Secondary | ICD-10-CM | POA: Diagnosis not present

## 2021-04-20 DIAGNOSIS — M9901 Segmental and somatic dysfunction of cervical region: Secondary | ICD-10-CM | POA: Diagnosis not present

## 2021-04-20 DIAGNOSIS — M5414 Radiculopathy, thoracic region: Secondary | ICD-10-CM | POA: Diagnosis not present

## 2021-04-20 DIAGNOSIS — M5032 Other cervical disc degeneration, mid-cervical region, unspecified level: Secondary | ICD-10-CM | POA: Diagnosis not present

## 2021-04-20 DIAGNOSIS — M9902 Segmental and somatic dysfunction of thoracic region: Secondary | ICD-10-CM | POA: Diagnosis not present

## 2021-04-24 DIAGNOSIS — M9902 Segmental and somatic dysfunction of thoracic region: Secondary | ICD-10-CM | POA: Diagnosis not present

## 2021-04-24 DIAGNOSIS — M9901 Segmental and somatic dysfunction of cervical region: Secondary | ICD-10-CM | POA: Diagnosis not present

## 2021-04-24 DIAGNOSIS — M5032 Other cervical disc degeneration, mid-cervical region, unspecified level: Secondary | ICD-10-CM | POA: Diagnosis not present

## 2021-04-24 DIAGNOSIS — M5414 Radiculopathy, thoracic region: Secondary | ICD-10-CM | POA: Diagnosis not present

## 2021-04-25 DIAGNOSIS — S32040D Wedge compression fracture of fourth lumbar vertebra, subsequent encounter for fracture with routine healing: Secondary | ICD-10-CM | POA: Diagnosis not present

## 2021-04-26 DIAGNOSIS — F4323 Adjustment disorder with mixed anxiety and depressed mood: Secondary | ICD-10-CM | POA: Diagnosis not present

## 2021-04-27 DIAGNOSIS — S32040D Wedge compression fracture of fourth lumbar vertebra, subsequent encounter for fracture with routine healing: Secondary | ICD-10-CM | POA: Diagnosis not present

## 2021-04-30 DIAGNOSIS — E782 Mixed hyperlipidemia: Secondary | ICD-10-CM | POA: Diagnosis not present

## 2021-05-01 DIAGNOSIS — M9902 Segmental and somatic dysfunction of thoracic region: Secondary | ICD-10-CM | POA: Diagnosis not present

## 2021-05-01 DIAGNOSIS — M9901 Segmental and somatic dysfunction of cervical region: Secondary | ICD-10-CM | POA: Diagnosis not present

## 2021-05-01 DIAGNOSIS — M5032 Other cervical disc degeneration, mid-cervical region, unspecified level: Secondary | ICD-10-CM | POA: Diagnosis not present

## 2021-05-01 DIAGNOSIS — M5414 Radiculopathy, thoracic region: Secondary | ICD-10-CM | POA: Diagnosis not present

## 2021-05-04 DIAGNOSIS — S32040D Wedge compression fracture of fourth lumbar vertebra, subsequent encounter for fracture with routine healing: Secondary | ICD-10-CM | POA: Diagnosis not present

## 2021-05-08 DIAGNOSIS — M5414 Radiculopathy, thoracic region: Secondary | ICD-10-CM | POA: Diagnosis not present

## 2021-05-08 DIAGNOSIS — M9902 Segmental and somatic dysfunction of thoracic region: Secondary | ICD-10-CM | POA: Diagnosis not present

## 2021-05-08 DIAGNOSIS — M5032 Other cervical disc degeneration, mid-cervical region, unspecified level: Secondary | ICD-10-CM | POA: Diagnosis not present

## 2021-05-08 DIAGNOSIS — M9901 Segmental and somatic dysfunction of cervical region: Secondary | ICD-10-CM | POA: Diagnosis not present

## 2021-05-09 DIAGNOSIS — S32040D Wedge compression fracture of fourth lumbar vertebra, subsequent encounter for fracture with routine healing: Secondary | ICD-10-CM | POA: Diagnosis not present

## 2021-05-10 DIAGNOSIS — M5032 Other cervical disc degeneration, mid-cervical region, unspecified level: Secondary | ICD-10-CM | POA: Diagnosis not present

## 2021-05-10 DIAGNOSIS — M9901 Segmental and somatic dysfunction of cervical region: Secondary | ICD-10-CM | POA: Diagnosis not present

## 2021-05-10 DIAGNOSIS — M9902 Segmental and somatic dysfunction of thoracic region: Secondary | ICD-10-CM | POA: Diagnosis not present

## 2021-05-10 DIAGNOSIS — M5414 Radiculopathy, thoracic region: Secondary | ICD-10-CM | POA: Diagnosis not present

## 2021-05-15 DIAGNOSIS — M5032 Other cervical disc degeneration, mid-cervical region, unspecified level: Secondary | ICD-10-CM | POA: Diagnosis not present

## 2021-05-15 DIAGNOSIS — M9901 Segmental and somatic dysfunction of cervical region: Secondary | ICD-10-CM | POA: Diagnosis not present

## 2021-05-15 DIAGNOSIS — M9902 Segmental and somatic dysfunction of thoracic region: Secondary | ICD-10-CM | POA: Diagnosis not present

## 2021-05-15 DIAGNOSIS — M5414 Radiculopathy, thoracic region: Secondary | ICD-10-CM | POA: Diagnosis not present

## 2021-05-17 DIAGNOSIS — M9902 Segmental and somatic dysfunction of thoracic region: Secondary | ICD-10-CM | POA: Diagnosis not present

## 2021-05-17 DIAGNOSIS — F4323 Adjustment disorder with mixed anxiety and depressed mood: Secondary | ICD-10-CM | POA: Diagnosis not present

## 2021-05-17 DIAGNOSIS — M9901 Segmental and somatic dysfunction of cervical region: Secondary | ICD-10-CM | POA: Diagnosis not present

## 2021-05-17 DIAGNOSIS — M5032 Other cervical disc degeneration, mid-cervical region, unspecified level: Secondary | ICD-10-CM | POA: Diagnosis not present

## 2021-05-17 DIAGNOSIS — M5414 Radiculopathy, thoracic region: Secondary | ICD-10-CM | POA: Diagnosis not present

## 2021-05-19 DIAGNOSIS — I119 Hypertensive heart disease without heart failure: Secondary | ICD-10-CM | POA: Diagnosis not present

## 2021-05-19 DIAGNOSIS — E7800 Pure hypercholesterolemia, unspecified: Secondary | ICD-10-CM | POA: Diagnosis not present

## 2021-05-22 DIAGNOSIS — M9901 Segmental and somatic dysfunction of cervical region: Secondary | ICD-10-CM | POA: Diagnosis not present

## 2021-05-22 DIAGNOSIS — M5032 Other cervical disc degeneration, mid-cervical region, unspecified level: Secondary | ICD-10-CM | POA: Diagnosis not present

## 2021-05-22 DIAGNOSIS — M9902 Segmental and somatic dysfunction of thoracic region: Secondary | ICD-10-CM | POA: Diagnosis not present

## 2021-05-22 DIAGNOSIS — M5414 Radiculopathy, thoracic region: Secondary | ICD-10-CM | POA: Diagnosis not present

## 2021-05-29 DIAGNOSIS — M9901 Segmental and somatic dysfunction of cervical region: Secondary | ICD-10-CM | POA: Diagnosis not present

## 2021-05-29 DIAGNOSIS — M9902 Segmental and somatic dysfunction of thoracic region: Secondary | ICD-10-CM | POA: Diagnosis not present

## 2021-05-29 DIAGNOSIS — M5414 Radiculopathy, thoracic region: Secondary | ICD-10-CM | POA: Diagnosis not present

## 2021-05-29 DIAGNOSIS — M5032 Other cervical disc degeneration, mid-cervical region, unspecified level: Secondary | ICD-10-CM | POA: Diagnosis not present

## 2021-05-31 DIAGNOSIS — R059 Cough, unspecified: Secondary | ICD-10-CM | POA: Diagnosis not present

## 2021-05-31 DIAGNOSIS — I119 Hypertensive heart disease without heart failure: Secondary | ICD-10-CM | POA: Diagnosis not present

## 2021-05-31 DIAGNOSIS — Z20822 Contact with and (suspected) exposure to covid-19: Secondary | ICD-10-CM | POA: Diagnosis not present

## 2021-06-06 DIAGNOSIS — E785 Hyperlipidemia, unspecified: Secondary | ICD-10-CM | POA: Diagnosis not present

## 2021-06-07 DIAGNOSIS — F4323 Adjustment disorder with mixed anxiety and depressed mood: Secondary | ICD-10-CM | POA: Diagnosis not present

## 2021-06-13 DIAGNOSIS — F329 Major depressive disorder, single episode, unspecified: Secondary | ICD-10-CM | POA: Diagnosis not present

## 2021-06-14 DIAGNOSIS — F4323 Adjustment disorder with mixed anxiety and depressed mood: Secondary | ICD-10-CM | POA: Diagnosis not present

## 2021-06-19 DIAGNOSIS — H02834 Dermatochalasis of left upper eyelid: Secondary | ICD-10-CM | POA: Diagnosis not present

## 2021-06-19 DIAGNOSIS — H18413 Arcus senilis, bilateral: Secondary | ICD-10-CM | POA: Diagnosis not present

## 2021-06-19 DIAGNOSIS — Z961 Presence of intraocular lens: Secondary | ICD-10-CM | POA: Diagnosis not present

## 2021-06-19 DIAGNOSIS — H02831 Dermatochalasis of right upper eyelid: Secondary | ICD-10-CM | POA: Diagnosis not present

## 2021-06-19 DIAGNOSIS — Z20822 Contact with and (suspected) exposure to covid-19: Secondary | ICD-10-CM | POA: Diagnosis not present

## 2021-06-21 DIAGNOSIS — F4323 Adjustment disorder with mixed anxiety and depressed mood: Secondary | ICD-10-CM | POA: Diagnosis not present

## 2021-06-22 DIAGNOSIS — F4323 Adjustment disorder with mixed anxiety and depressed mood: Secondary | ICD-10-CM | POA: Diagnosis not present

## 2021-06-28 DIAGNOSIS — F4323 Adjustment disorder with mixed anxiety and depressed mood: Secondary | ICD-10-CM | POA: Diagnosis not present

## 2021-07-12 DIAGNOSIS — F4323 Adjustment disorder with mixed anxiety and depressed mood: Secondary | ICD-10-CM | POA: Diagnosis not present

## 2021-07-19 DIAGNOSIS — F4323 Adjustment disorder with mixed anxiety and depressed mood: Secondary | ICD-10-CM | POA: Diagnosis not present

## 2021-07-19 IMAGING — MG MM DIGITAL SCREENING BILAT W/ TOMO W/ CAD
6 of 10 series · 6 of 30 positions shown · non-contrast
Comparison: Previous exam(s).

CLINICAL DATA: Screening.

EXAM:
DIGITAL SCREENING BILATERAL MAMMOGRAM WITH TOMO AND CAD

[L MLO synth-2D (1 of 2)]
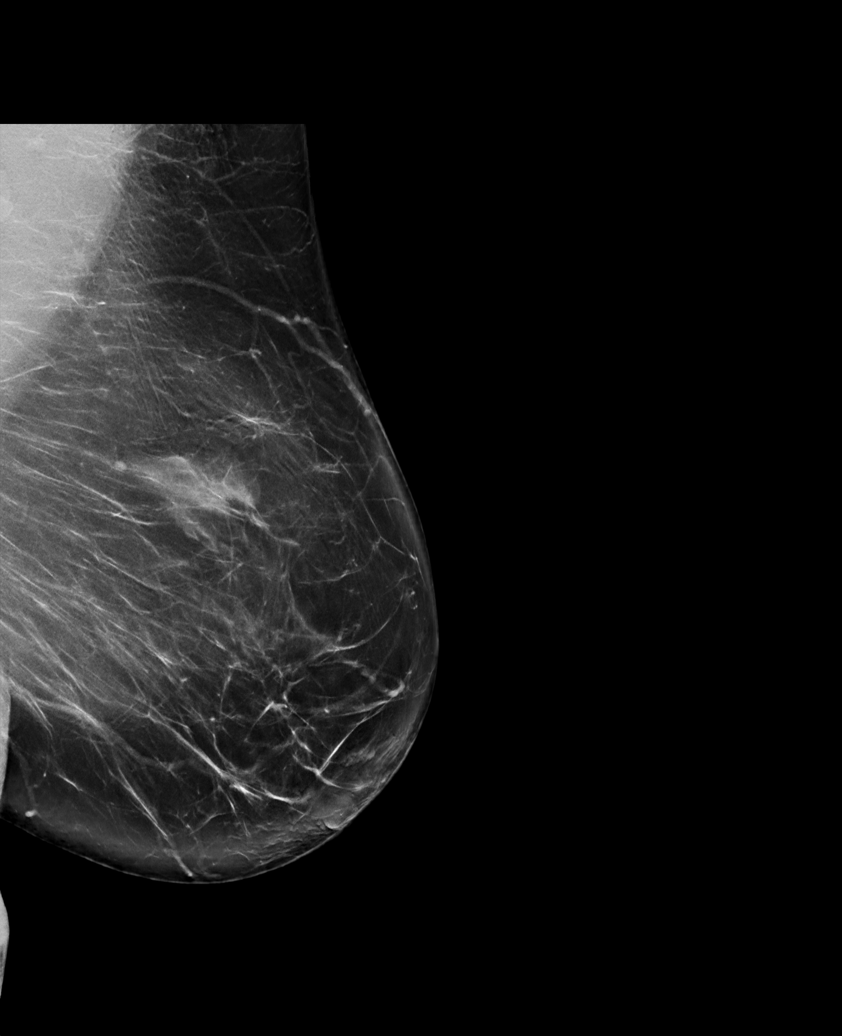

[R MLO synth-2D]
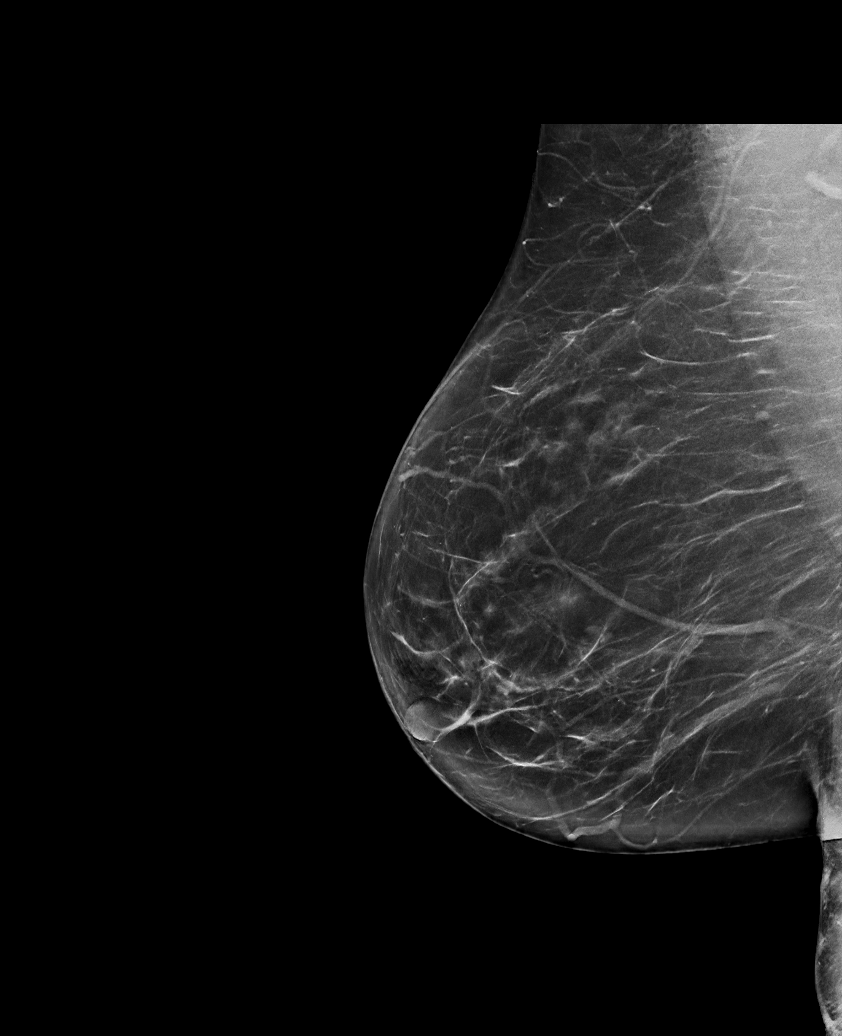

[R CC synth-2D]
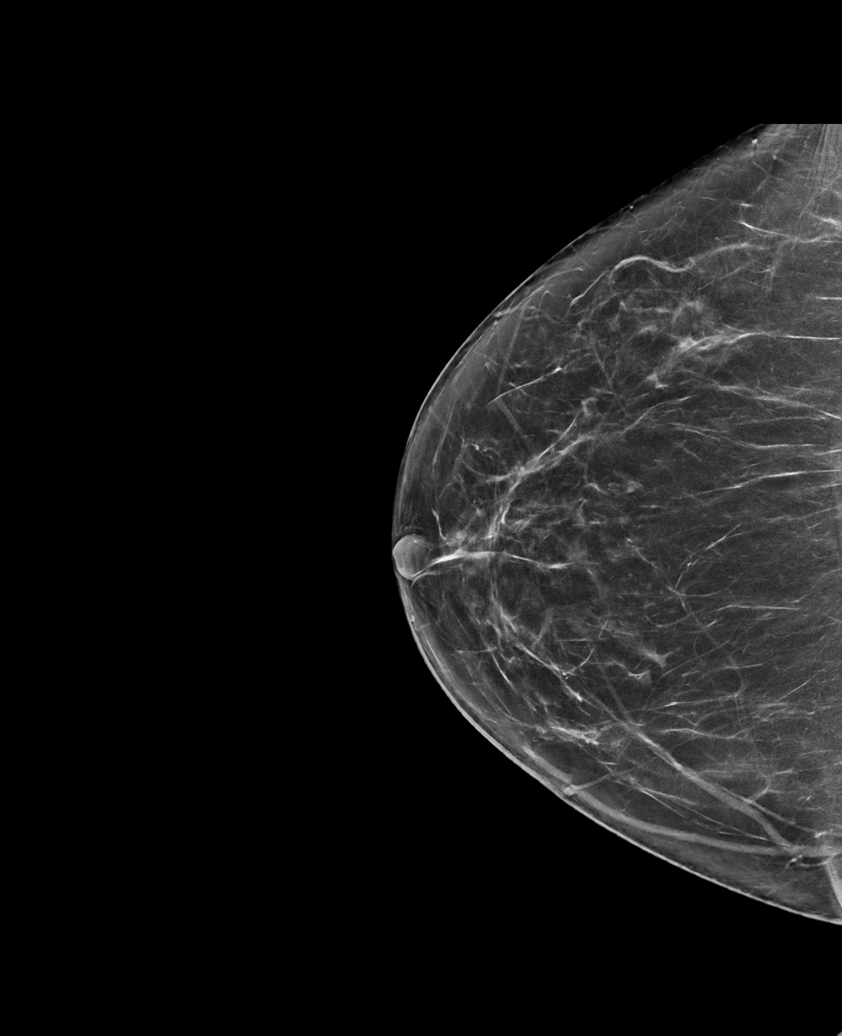

[L MLO synth-2D (2 of 2)]
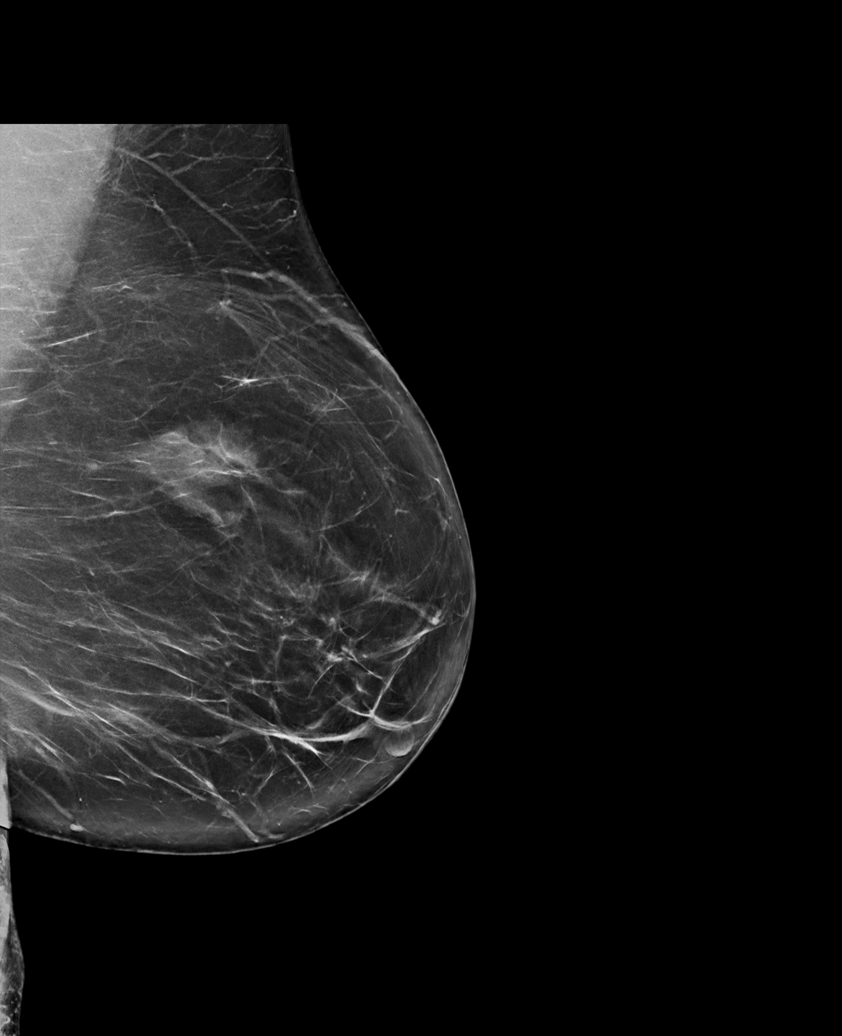

[L CC synth-2D]
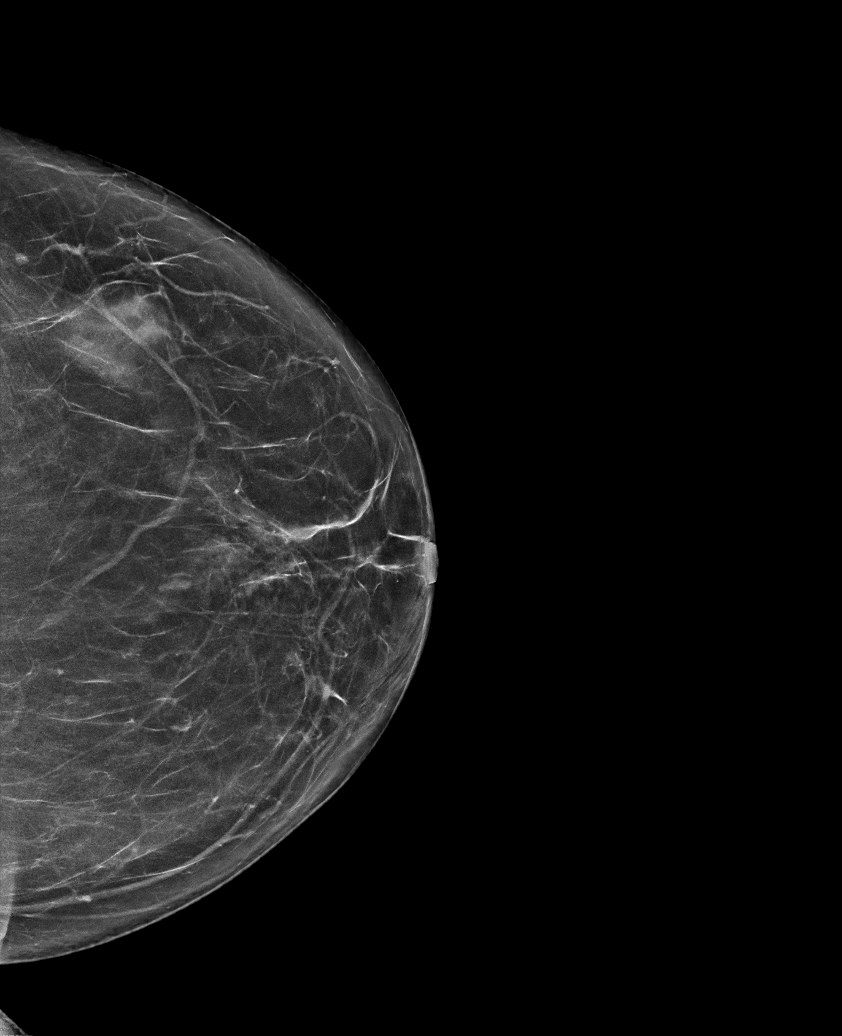

[L MLO tomo · tomo slice 44/87.0]
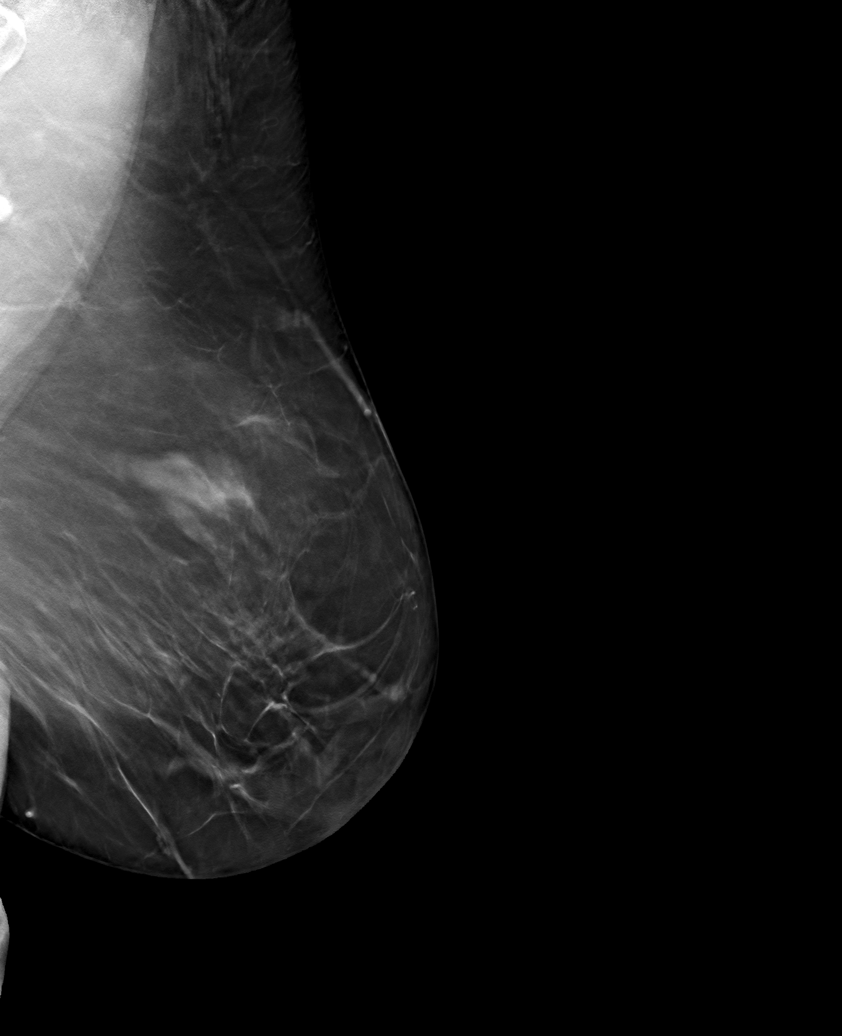

[6 of 30 positions shown; findings below may reference images not displayed]

ACR Breast Density Category b: There are scattered areas of
fibroglandular density.
FINDINGS: There are no findings suspicious for malignancy. Images were
processed with CAD.
IMPRESSION: No mammographic evidence of malignancy. A result letter of this
screening mammogram will be mailed directly to the patient.

RECOMMENDATION:
Screening mammogram in one year. (Code:[id number])

BI-RADS CATEGORY  1: Negative.

## 2021-07-26 DIAGNOSIS — F4323 Adjustment disorder with mixed anxiety and depressed mood: Secondary | ICD-10-CM | POA: Diagnosis not present

## 2021-08-03 ENCOUNTER — Other Ambulatory Visit: Payer: Self-pay

## 2021-08-03 ENCOUNTER — Encounter (INDEPENDENT_AMBULATORY_CARE_PROVIDER_SITE_OTHER): Payer: Medicare Other | Admitting: Ophthalmology

## 2021-08-03 DIAGNOSIS — H43811 Vitreous degeneration, right eye: Secondary | ICD-10-CM | POA: Diagnosis not present

## 2021-08-03 DIAGNOSIS — I10 Essential (primary) hypertension: Secondary | ICD-10-CM

## 2021-08-03 DIAGNOSIS — H4423 Degenerative myopia, bilateral: Secondary | ICD-10-CM

## 2021-08-03 DIAGNOSIS — H35033 Hypertensive retinopathy, bilateral: Secondary | ICD-10-CM | POA: Diagnosis not present

## 2021-08-09 DIAGNOSIS — F4323 Adjustment disorder with mixed anxiety and depressed mood: Secondary | ICD-10-CM | POA: Diagnosis not present

## 2021-08-10 ENCOUNTER — Encounter (INDEPENDENT_AMBULATORY_CARE_PROVIDER_SITE_OTHER): Payer: Medicare Other | Admitting: Ophthalmology

## 2021-08-16 DIAGNOSIS — F4323 Adjustment disorder with mixed anxiety and depressed mood: Secondary | ICD-10-CM | POA: Diagnosis not present

## 2021-08-23 DIAGNOSIS — M5414 Radiculopathy, thoracic region: Secondary | ICD-10-CM | POA: Diagnosis not present

## 2021-08-23 DIAGNOSIS — M5032 Other cervical disc degeneration, mid-cervical region, unspecified level: Secondary | ICD-10-CM | POA: Diagnosis not present

## 2021-08-23 DIAGNOSIS — M9902 Segmental and somatic dysfunction of thoracic region: Secondary | ICD-10-CM | POA: Diagnosis not present

## 2021-08-23 DIAGNOSIS — M9901 Segmental and somatic dysfunction of cervical region: Secondary | ICD-10-CM | POA: Diagnosis not present

## 2021-08-24 DIAGNOSIS — M5459 Other low back pain: Secondary | ICD-10-CM | POA: Diagnosis not present

## 2021-08-24 DIAGNOSIS — S32040D Wedge compression fracture of fourth lumbar vertebra, subsequent encounter for fracture with routine healing: Secondary | ICD-10-CM | POA: Diagnosis not present

## 2021-08-29 DIAGNOSIS — M5459 Other low back pain: Secondary | ICD-10-CM | POA: Diagnosis not present

## 2021-08-29 DIAGNOSIS — S32040D Wedge compression fracture of fourth lumbar vertebra, subsequent encounter for fracture with routine healing: Secondary | ICD-10-CM | POA: Diagnosis not present

## 2021-08-30 DIAGNOSIS — M9902 Segmental and somatic dysfunction of thoracic region: Secondary | ICD-10-CM | POA: Diagnosis not present

## 2021-08-30 DIAGNOSIS — F4323 Adjustment disorder with mixed anxiety and depressed mood: Secondary | ICD-10-CM | POA: Diagnosis not present

## 2021-08-30 DIAGNOSIS — M9901 Segmental and somatic dysfunction of cervical region: Secondary | ICD-10-CM | POA: Diagnosis not present

## 2021-08-30 DIAGNOSIS — M5032 Other cervical disc degeneration, mid-cervical region, unspecified level: Secondary | ICD-10-CM | POA: Diagnosis not present

## 2021-08-30 DIAGNOSIS — M5414 Radiculopathy, thoracic region: Secondary | ICD-10-CM | POA: Diagnosis not present

## 2021-08-31 DIAGNOSIS — S32040D Wedge compression fracture of fourth lumbar vertebra, subsequent encounter for fracture with routine healing: Secondary | ICD-10-CM | POA: Diagnosis not present

## 2021-08-31 DIAGNOSIS — M5459 Other low back pain: Secondary | ICD-10-CM | POA: Diagnosis not present

## 2021-09-04 DIAGNOSIS — M5414 Radiculopathy, thoracic region: Secondary | ICD-10-CM | POA: Diagnosis not present

## 2021-09-04 DIAGNOSIS — M9901 Segmental and somatic dysfunction of cervical region: Secondary | ICD-10-CM | POA: Diagnosis not present

## 2021-09-04 DIAGNOSIS — M9902 Segmental and somatic dysfunction of thoracic region: Secondary | ICD-10-CM | POA: Diagnosis not present

## 2021-09-04 DIAGNOSIS — M5032 Other cervical disc degeneration, mid-cervical region, unspecified level: Secondary | ICD-10-CM | POA: Diagnosis not present

## 2021-09-05 DIAGNOSIS — M5459 Other low back pain: Secondary | ICD-10-CM | POA: Diagnosis not present

## 2021-09-05 DIAGNOSIS — S32040D Wedge compression fracture of fourth lumbar vertebra, subsequent encounter for fracture with routine healing: Secondary | ICD-10-CM | POA: Diagnosis not present

## 2021-09-06 DIAGNOSIS — M5032 Other cervical disc degeneration, mid-cervical region, unspecified level: Secondary | ICD-10-CM | POA: Diagnosis not present

## 2021-09-06 DIAGNOSIS — M9902 Segmental and somatic dysfunction of thoracic region: Secondary | ICD-10-CM | POA: Diagnosis not present

## 2021-09-06 DIAGNOSIS — M5414 Radiculopathy, thoracic region: Secondary | ICD-10-CM | POA: Diagnosis not present

## 2021-09-06 DIAGNOSIS — M9901 Segmental and somatic dysfunction of cervical region: Secondary | ICD-10-CM | POA: Diagnosis not present

## 2021-09-06 DIAGNOSIS — F4323 Adjustment disorder with mixed anxiety and depressed mood: Secondary | ICD-10-CM | POA: Diagnosis not present

## 2021-09-07 DIAGNOSIS — S32040D Wedge compression fracture of fourth lumbar vertebra, subsequent encounter for fracture with routine healing: Secondary | ICD-10-CM | POA: Diagnosis not present

## 2021-09-07 DIAGNOSIS — M5459 Other low back pain: Secondary | ICD-10-CM | POA: Diagnosis not present

## 2021-09-11 DIAGNOSIS — M5032 Other cervical disc degeneration, mid-cervical region, unspecified level: Secondary | ICD-10-CM | POA: Diagnosis not present

## 2021-09-11 DIAGNOSIS — F329 Major depressive disorder, single episode, unspecified: Secondary | ICD-10-CM | POA: Diagnosis not present

## 2021-09-11 DIAGNOSIS — M9901 Segmental and somatic dysfunction of cervical region: Secondary | ICD-10-CM | POA: Diagnosis not present

## 2021-09-11 DIAGNOSIS — M5414 Radiculopathy, thoracic region: Secondary | ICD-10-CM | POA: Diagnosis not present

## 2021-09-11 DIAGNOSIS — M9902 Segmental and somatic dysfunction of thoracic region: Secondary | ICD-10-CM | POA: Diagnosis not present

## 2021-09-12 DIAGNOSIS — M5459 Other low back pain: Secondary | ICD-10-CM | POA: Diagnosis not present

## 2021-09-12 DIAGNOSIS — S32040D Wedge compression fracture of fourth lumbar vertebra, subsequent encounter for fracture with routine healing: Secondary | ICD-10-CM | POA: Diagnosis not present

## 2021-09-13 DIAGNOSIS — F4323 Adjustment disorder with mixed anxiety and depressed mood: Secondary | ICD-10-CM | POA: Diagnosis not present

## 2021-09-13 DIAGNOSIS — M9901 Segmental and somatic dysfunction of cervical region: Secondary | ICD-10-CM | POA: Diagnosis not present

## 2021-09-13 DIAGNOSIS — M5414 Radiculopathy, thoracic region: Secondary | ICD-10-CM | POA: Diagnosis not present

## 2021-09-13 DIAGNOSIS — M9902 Segmental and somatic dysfunction of thoracic region: Secondary | ICD-10-CM | POA: Diagnosis not present

## 2021-09-13 DIAGNOSIS — M5032 Other cervical disc degeneration, mid-cervical region, unspecified level: Secondary | ICD-10-CM | POA: Diagnosis not present

## 2021-09-14 DIAGNOSIS — M5459 Other low back pain: Secondary | ICD-10-CM | POA: Diagnosis not present

## 2021-09-14 DIAGNOSIS — S32040D Wedge compression fracture of fourth lumbar vertebra, subsequent encounter for fracture with routine healing: Secondary | ICD-10-CM | POA: Diagnosis not present

## 2021-09-18 DIAGNOSIS — H43391 Other vitreous opacities, right eye: Secondary | ICD-10-CM | POA: Diagnosis not present

## 2021-09-18 DIAGNOSIS — H02831 Dermatochalasis of right upper eyelid: Secondary | ICD-10-CM | POA: Diagnosis not present

## 2021-09-18 DIAGNOSIS — M5459 Other low back pain: Secondary | ICD-10-CM | POA: Diagnosis not present

## 2021-09-18 DIAGNOSIS — H43813 Vitreous degeneration, bilateral: Secondary | ICD-10-CM | POA: Diagnosis not present

## 2021-09-18 DIAGNOSIS — H26491 Other secondary cataract, right eye: Secondary | ICD-10-CM | POA: Diagnosis not present

## 2021-09-18 DIAGNOSIS — H26493 Other secondary cataract, bilateral: Secondary | ICD-10-CM | POA: Diagnosis not present

## 2021-09-18 DIAGNOSIS — S32040D Wedge compression fracture of fourth lumbar vertebra, subsequent encounter for fracture with routine healing: Secondary | ICD-10-CM | POA: Diagnosis not present

## 2021-09-19 DIAGNOSIS — M5414 Radiculopathy, thoracic region: Secondary | ICD-10-CM | POA: Diagnosis not present

## 2021-09-19 DIAGNOSIS — M5032 Other cervical disc degeneration, mid-cervical region, unspecified level: Secondary | ICD-10-CM | POA: Diagnosis not present

## 2021-09-19 DIAGNOSIS — M9902 Segmental and somatic dysfunction of thoracic region: Secondary | ICD-10-CM | POA: Diagnosis not present

## 2021-09-19 DIAGNOSIS — M9901 Segmental and somatic dysfunction of cervical region: Secondary | ICD-10-CM | POA: Diagnosis not present

## 2021-09-20 DIAGNOSIS — F4323 Adjustment disorder with mixed anxiety and depressed mood: Secondary | ICD-10-CM | POA: Diagnosis not present

## 2021-09-21 DIAGNOSIS — M5459 Other low back pain: Secondary | ICD-10-CM | POA: Diagnosis not present

## 2021-09-21 DIAGNOSIS — M9901 Segmental and somatic dysfunction of cervical region: Secondary | ICD-10-CM | POA: Diagnosis not present

## 2021-09-21 DIAGNOSIS — S32040D Wedge compression fracture of fourth lumbar vertebra, subsequent encounter for fracture with routine healing: Secondary | ICD-10-CM | POA: Diagnosis not present

## 2021-09-21 DIAGNOSIS — M9902 Segmental and somatic dysfunction of thoracic region: Secondary | ICD-10-CM | POA: Diagnosis not present

## 2021-09-21 DIAGNOSIS — M5414 Radiculopathy, thoracic region: Secondary | ICD-10-CM | POA: Diagnosis not present

## 2021-09-21 DIAGNOSIS — M5032 Other cervical disc degeneration, mid-cervical region, unspecified level: Secondary | ICD-10-CM | POA: Diagnosis not present

## 2021-09-25 DIAGNOSIS — E559 Vitamin D deficiency, unspecified: Secondary | ICD-10-CM | POA: Diagnosis not present

## 2021-09-25 DIAGNOSIS — R5383 Other fatigue: Secondary | ICD-10-CM | POA: Diagnosis not present

## 2021-09-25 DIAGNOSIS — M9901 Segmental and somatic dysfunction of cervical region: Secondary | ICD-10-CM | POA: Diagnosis not present

## 2021-09-25 DIAGNOSIS — K5150 Left sided colitis without complications: Secondary | ICD-10-CM | POA: Diagnosis not present

## 2021-09-25 DIAGNOSIS — E785 Hyperlipidemia, unspecified: Secondary | ICD-10-CM | POA: Diagnosis not present

## 2021-09-25 DIAGNOSIS — E7210 Disorders of sulfur-bearing amino-acid metabolism, unspecified: Secondary | ICD-10-CM | POA: Diagnosis not present

## 2021-09-25 DIAGNOSIS — M9902 Segmental and somatic dysfunction of thoracic region: Secondary | ICD-10-CM | POA: Diagnosis not present

## 2021-09-25 DIAGNOSIS — M5032 Other cervical disc degeneration, mid-cervical region, unspecified level: Secondary | ICD-10-CM | POA: Diagnosis not present

## 2021-09-25 DIAGNOSIS — I7090 Unspecified atherosclerosis: Secondary | ICD-10-CM | POA: Diagnosis not present

## 2021-09-25 DIAGNOSIS — E782 Mixed hyperlipidemia: Secondary | ICD-10-CM | POA: Diagnosis not present

## 2021-09-25 DIAGNOSIS — R7989 Other specified abnormal findings of blood chemistry: Secondary | ICD-10-CM | POA: Diagnosis not present

## 2021-09-25 DIAGNOSIS — M5414 Radiculopathy, thoracic region: Secondary | ICD-10-CM | POA: Diagnosis not present

## 2021-09-27 DIAGNOSIS — M9902 Segmental and somatic dysfunction of thoracic region: Secondary | ICD-10-CM | POA: Diagnosis not present

## 2021-09-27 DIAGNOSIS — M5032 Other cervical disc degeneration, mid-cervical region, unspecified level: Secondary | ICD-10-CM | POA: Diagnosis not present

## 2021-09-27 DIAGNOSIS — M9901 Segmental and somatic dysfunction of cervical region: Secondary | ICD-10-CM | POA: Diagnosis not present

## 2021-09-27 DIAGNOSIS — M5414 Radiculopathy, thoracic region: Secondary | ICD-10-CM | POA: Diagnosis not present

## 2021-10-04 DIAGNOSIS — U071 COVID-19: Secondary | ICD-10-CM | POA: Diagnosis not present

## 2021-10-10 DIAGNOSIS — M5459 Other low back pain: Secondary | ICD-10-CM | POA: Diagnosis not present

## 2021-10-10 DIAGNOSIS — S32040D Wedge compression fracture of fourth lumbar vertebra, subsequent encounter for fracture with routine healing: Secondary | ICD-10-CM | POA: Diagnosis not present

## 2021-10-11 DIAGNOSIS — F4323 Adjustment disorder with mixed anxiety and depressed mood: Secondary | ICD-10-CM | POA: Diagnosis not present

## 2021-10-12 DIAGNOSIS — S32040D Wedge compression fracture of fourth lumbar vertebra, subsequent encounter for fracture with routine healing: Secondary | ICD-10-CM | POA: Diagnosis not present

## 2021-10-12 DIAGNOSIS — M5459 Other low back pain: Secondary | ICD-10-CM | POA: Diagnosis not present

## 2021-10-13 DIAGNOSIS — E785 Hyperlipidemia, unspecified: Secondary | ICD-10-CM | POA: Diagnosis not present

## 2021-10-16 DIAGNOSIS — M5032 Other cervical disc degeneration, mid-cervical region, unspecified level: Secondary | ICD-10-CM | POA: Diagnosis not present

## 2021-10-16 DIAGNOSIS — M9901 Segmental and somatic dysfunction of cervical region: Secondary | ICD-10-CM | POA: Diagnosis not present

## 2021-10-16 DIAGNOSIS — M9902 Segmental and somatic dysfunction of thoracic region: Secondary | ICD-10-CM | POA: Diagnosis not present

## 2021-10-16 DIAGNOSIS — M5414 Radiculopathy, thoracic region: Secondary | ICD-10-CM | POA: Diagnosis not present

## 2021-10-17 DIAGNOSIS — M5459 Other low back pain: Secondary | ICD-10-CM | POA: Diagnosis not present

## 2021-10-17 DIAGNOSIS — S32040D Wedge compression fracture of fourth lumbar vertebra, subsequent encounter for fracture with routine healing: Secondary | ICD-10-CM | POA: Diagnosis not present

## 2021-10-18 DIAGNOSIS — M5414 Radiculopathy, thoracic region: Secondary | ICD-10-CM | POA: Diagnosis not present

## 2021-10-18 DIAGNOSIS — F4323 Adjustment disorder with mixed anxiety and depressed mood: Secondary | ICD-10-CM | POA: Diagnosis not present

## 2021-10-18 DIAGNOSIS — M9901 Segmental and somatic dysfunction of cervical region: Secondary | ICD-10-CM | POA: Diagnosis not present

## 2021-10-18 DIAGNOSIS — M5032 Other cervical disc degeneration, mid-cervical region, unspecified level: Secondary | ICD-10-CM | POA: Diagnosis not present

## 2021-10-18 DIAGNOSIS — M9902 Segmental and somatic dysfunction of thoracic region: Secondary | ICD-10-CM | POA: Diagnosis not present

## 2021-10-19 DIAGNOSIS — S32040D Wedge compression fracture of fourth lumbar vertebra, subsequent encounter for fracture with routine healing: Secondary | ICD-10-CM | POA: Diagnosis not present

## 2021-10-19 DIAGNOSIS — M5459 Other low back pain: Secondary | ICD-10-CM | POA: Diagnosis not present

## 2021-10-23 DIAGNOSIS — M5414 Radiculopathy, thoracic region: Secondary | ICD-10-CM | POA: Diagnosis not present

## 2021-10-23 DIAGNOSIS — M5032 Other cervical disc degeneration, mid-cervical region, unspecified level: Secondary | ICD-10-CM | POA: Diagnosis not present

## 2021-10-23 DIAGNOSIS — M9902 Segmental and somatic dysfunction of thoracic region: Secondary | ICD-10-CM | POA: Diagnosis not present

## 2021-10-23 DIAGNOSIS — M9901 Segmental and somatic dysfunction of cervical region: Secondary | ICD-10-CM | POA: Diagnosis not present

## 2021-11-01 DIAGNOSIS — M9902 Segmental and somatic dysfunction of thoracic region: Secondary | ICD-10-CM | POA: Diagnosis not present

## 2021-11-01 DIAGNOSIS — M5032 Other cervical disc degeneration, mid-cervical region, unspecified level: Secondary | ICD-10-CM | POA: Diagnosis not present

## 2021-11-01 DIAGNOSIS — M9901 Segmental and somatic dysfunction of cervical region: Secondary | ICD-10-CM | POA: Diagnosis not present

## 2021-11-01 DIAGNOSIS — M5414 Radiculopathy, thoracic region: Secondary | ICD-10-CM | POA: Diagnosis not present

## 2021-11-02 DIAGNOSIS — S32040D Wedge compression fracture of fourth lumbar vertebra, subsequent encounter for fracture with routine healing: Secondary | ICD-10-CM | POA: Diagnosis not present

## 2021-11-02 DIAGNOSIS — M5459 Other low back pain: Secondary | ICD-10-CM | POA: Diagnosis not present

## 2021-11-06 DIAGNOSIS — M5459 Other low back pain: Secondary | ICD-10-CM | POA: Diagnosis not present

## 2021-11-06 DIAGNOSIS — S32040D Wedge compression fracture of fourth lumbar vertebra, subsequent encounter for fracture with routine healing: Secondary | ICD-10-CM | POA: Diagnosis not present

## 2021-11-07 DIAGNOSIS — M5414 Radiculopathy, thoracic region: Secondary | ICD-10-CM | POA: Diagnosis not present

## 2021-11-07 DIAGNOSIS — M9901 Segmental and somatic dysfunction of cervical region: Secondary | ICD-10-CM | POA: Diagnosis not present

## 2021-11-07 DIAGNOSIS — M9902 Segmental and somatic dysfunction of thoracic region: Secondary | ICD-10-CM | POA: Diagnosis not present

## 2021-11-07 DIAGNOSIS — M5032 Other cervical disc degeneration, mid-cervical region, unspecified level: Secondary | ICD-10-CM | POA: Diagnosis not present

## 2021-11-08 DIAGNOSIS — H35361 Drusen (degenerative) of macula, right eye: Secondary | ICD-10-CM | POA: Diagnosis not present

## 2021-11-08 DIAGNOSIS — H43813 Vitreous degeneration, bilateral: Secondary | ICD-10-CM | POA: Diagnosis not present

## 2021-11-08 DIAGNOSIS — H43391 Other vitreous opacities, right eye: Secondary | ICD-10-CM | POA: Diagnosis not present

## 2021-11-08 DIAGNOSIS — H35373 Puckering of macula, bilateral: Secondary | ICD-10-CM | POA: Diagnosis not present

## 2021-11-08 DIAGNOSIS — F4323 Adjustment disorder with mixed anxiety and depressed mood: Secondary | ICD-10-CM | POA: Diagnosis not present

## 2021-11-08 DIAGNOSIS — H26492 Other secondary cataract, left eye: Secondary | ICD-10-CM | POA: Diagnosis not present

## 2021-11-08 DIAGNOSIS — H15833 Staphyloma posticum, bilateral: Secondary | ICD-10-CM | POA: Diagnosis not present

## 2021-11-09 DIAGNOSIS — M5414 Radiculopathy, thoracic region: Secondary | ICD-10-CM | POA: Diagnosis not present

## 2021-11-09 DIAGNOSIS — M9902 Segmental and somatic dysfunction of thoracic region: Secondary | ICD-10-CM | POA: Diagnosis not present

## 2021-11-09 DIAGNOSIS — M9901 Segmental and somatic dysfunction of cervical region: Secondary | ICD-10-CM | POA: Diagnosis not present

## 2021-11-09 DIAGNOSIS — M5032 Other cervical disc degeneration, mid-cervical region, unspecified level: Secondary | ICD-10-CM | POA: Diagnosis not present

## 2021-11-13 DIAGNOSIS — M5032 Other cervical disc degeneration, mid-cervical region, unspecified level: Secondary | ICD-10-CM | POA: Diagnosis not present

## 2021-11-13 DIAGNOSIS — M9901 Segmental and somatic dysfunction of cervical region: Secondary | ICD-10-CM | POA: Diagnosis not present

## 2021-11-13 DIAGNOSIS — M5414 Radiculopathy, thoracic region: Secondary | ICD-10-CM | POA: Diagnosis not present

## 2021-11-13 DIAGNOSIS — E785 Hyperlipidemia, unspecified: Secondary | ICD-10-CM | POA: Diagnosis not present

## 2021-11-13 DIAGNOSIS — M9902 Segmental and somatic dysfunction of thoracic region: Secondary | ICD-10-CM | POA: Diagnosis not present

## 2021-11-14 DIAGNOSIS — E785 Hyperlipidemia, unspecified: Secondary | ICD-10-CM | POA: Diagnosis not present

## 2021-11-15 DIAGNOSIS — S32040D Wedge compression fracture of fourth lumbar vertebra, subsequent encounter for fracture with routine healing: Secondary | ICD-10-CM | POA: Diagnosis not present

## 2021-11-15 DIAGNOSIS — F4323 Adjustment disorder with mixed anxiety and depressed mood: Secondary | ICD-10-CM | POA: Diagnosis not present

## 2021-11-15 DIAGNOSIS — M5459 Other low back pain: Secondary | ICD-10-CM | POA: Diagnosis not present

## 2021-11-16 DIAGNOSIS — S32040D Wedge compression fracture of fourth lumbar vertebra, subsequent encounter for fracture with routine healing: Secondary | ICD-10-CM | POA: Diagnosis not present

## 2021-11-16 DIAGNOSIS — M5459 Other low back pain: Secondary | ICD-10-CM | POA: Diagnosis not present

## 2021-11-20 DIAGNOSIS — M9901 Segmental and somatic dysfunction of cervical region: Secondary | ICD-10-CM | POA: Diagnosis not present

## 2021-11-20 DIAGNOSIS — M5414 Radiculopathy, thoracic region: Secondary | ICD-10-CM | POA: Diagnosis not present

## 2021-11-20 DIAGNOSIS — E785 Hyperlipidemia, unspecified: Secondary | ICD-10-CM | POA: Diagnosis not present

## 2021-11-20 DIAGNOSIS — M9902 Segmental and somatic dysfunction of thoracic region: Secondary | ICD-10-CM | POA: Diagnosis not present

## 2021-11-20 DIAGNOSIS — M5032 Other cervical disc degeneration, mid-cervical region, unspecified level: Secondary | ICD-10-CM | POA: Diagnosis not present

## 2021-11-21 DIAGNOSIS — S32040D Wedge compression fracture of fourth lumbar vertebra, subsequent encounter for fracture with routine healing: Secondary | ICD-10-CM | POA: Diagnosis not present

## 2021-11-21 DIAGNOSIS — M5459 Other low back pain: Secondary | ICD-10-CM | POA: Diagnosis not present

## 2021-11-23 DIAGNOSIS — S32040D Wedge compression fracture of fourth lumbar vertebra, subsequent encounter for fracture with routine healing: Secondary | ICD-10-CM | POA: Diagnosis not present

## 2021-11-23 DIAGNOSIS — M5459 Other low back pain: Secondary | ICD-10-CM | POA: Diagnosis not present

## 2021-11-28 DIAGNOSIS — S32040D Wedge compression fracture of fourth lumbar vertebra, subsequent encounter for fracture with routine healing: Secondary | ICD-10-CM | POA: Diagnosis not present

## 2021-11-28 DIAGNOSIS — M5459 Other low back pain: Secondary | ICD-10-CM | POA: Diagnosis not present

## 2021-11-30 DIAGNOSIS — M5459 Other low back pain: Secondary | ICD-10-CM | POA: Diagnosis not present

## 2021-11-30 DIAGNOSIS — S32040D Wedge compression fracture of fourth lumbar vertebra, subsequent encounter for fracture with routine healing: Secondary | ICD-10-CM | POA: Diagnosis not present

## 2021-12-11 DIAGNOSIS — F331 Major depressive disorder, recurrent, moderate: Secondary | ICD-10-CM | POA: Diagnosis not present

## 2021-12-12 DIAGNOSIS — F319 Bipolar disorder, unspecified: Secondary | ICD-10-CM | POA: Diagnosis not present

## 2021-12-12 DIAGNOSIS — M5459 Other low back pain: Secondary | ICD-10-CM | POA: Diagnosis not present

## 2021-12-12 DIAGNOSIS — S32040D Wedge compression fracture of fourth lumbar vertebra, subsequent encounter for fracture with routine healing: Secondary | ICD-10-CM | POA: Diagnosis not present

## 2021-12-13 DIAGNOSIS — F319 Bipolar disorder, unspecified: Secondary | ICD-10-CM | POA: Diagnosis not present

## 2021-12-15 DIAGNOSIS — F331 Major depressive disorder, recurrent, moderate: Secondary | ICD-10-CM | POA: Diagnosis not present

## 2021-12-19 DIAGNOSIS — F319 Bipolar disorder, unspecified: Secondary | ICD-10-CM | POA: Diagnosis not present

## 2021-12-20 DIAGNOSIS — F331 Major depressive disorder, recurrent, moderate: Secondary | ICD-10-CM | POA: Diagnosis not present

## 2021-12-21 DIAGNOSIS — F319 Bipolar disorder, unspecified: Secondary | ICD-10-CM | POA: Diagnosis not present

## 2021-12-25 DIAGNOSIS — F319 Bipolar disorder, unspecified: Secondary | ICD-10-CM | POA: Diagnosis not present

## 2021-12-27 DIAGNOSIS — F319 Bipolar disorder, unspecified: Secondary | ICD-10-CM | POA: Diagnosis not present

## 2021-12-28 DIAGNOSIS — F331 Major depressive disorder, recurrent, moderate: Secondary | ICD-10-CM | POA: Diagnosis not present

## 2021-12-29 DIAGNOSIS — E7210 Disorders of sulfur-bearing amino-acid metabolism, unspecified: Secondary | ICD-10-CM | POA: Diagnosis not present

## 2021-12-29 DIAGNOSIS — E782 Mixed hyperlipidemia: Secondary | ICD-10-CM | POA: Diagnosis not present

## 2021-12-29 DIAGNOSIS — I7090 Unspecified atherosclerosis: Secondary | ICD-10-CM | POA: Diagnosis not present

## 2021-12-29 DIAGNOSIS — E559 Vitamin D deficiency, unspecified: Secondary | ICD-10-CM | POA: Diagnosis not present

## 2021-12-29 DIAGNOSIS — R7989 Other specified abnormal findings of blood chemistry: Secondary | ICD-10-CM | POA: Diagnosis not present

## 2021-12-29 DIAGNOSIS — K5150 Left sided colitis without complications: Secondary | ICD-10-CM | POA: Diagnosis not present

## 2021-12-29 DIAGNOSIS — R5383 Other fatigue: Secondary | ICD-10-CM | POA: Diagnosis not present

## 2022-01-01 DIAGNOSIS — F319 Bipolar disorder, unspecified: Secondary | ICD-10-CM | POA: Diagnosis not present

## 2022-01-03 DIAGNOSIS — M5032 Other cervical disc degeneration, mid-cervical region, unspecified level: Secondary | ICD-10-CM | POA: Diagnosis not present

## 2022-01-03 DIAGNOSIS — E785 Hyperlipidemia, unspecified: Secondary | ICD-10-CM | POA: Diagnosis not present

## 2022-01-03 DIAGNOSIS — M9902 Segmental and somatic dysfunction of thoracic region: Secondary | ICD-10-CM | POA: Diagnosis not present

## 2022-01-03 DIAGNOSIS — M5414 Radiculopathy, thoracic region: Secondary | ICD-10-CM | POA: Diagnosis not present

## 2022-01-03 DIAGNOSIS — M9901 Segmental and somatic dysfunction of cervical region: Secondary | ICD-10-CM | POA: Diagnosis not present

## 2022-01-03 DIAGNOSIS — F319 Bipolar disorder, unspecified: Secondary | ICD-10-CM | POA: Diagnosis not present

## 2022-01-04 DIAGNOSIS — F331 Major depressive disorder, recurrent, moderate: Secondary | ICD-10-CM | POA: Diagnosis not present

## 2022-01-08 DIAGNOSIS — F319 Bipolar disorder, unspecified: Secondary | ICD-10-CM | POA: Diagnosis not present

## 2022-01-09 DIAGNOSIS — F331 Major depressive disorder, recurrent, moderate: Secondary | ICD-10-CM | POA: Diagnosis not present

## 2022-01-12 DIAGNOSIS — F319 Bipolar disorder, unspecified: Secondary | ICD-10-CM | POA: Diagnosis not present

## 2022-01-15 DIAGNOSIS — F331 Major depressive disorder, recurrent, moderate: Secondary | ICD-10-CM | POA: Diagnosis not present

## 2022-01-16 DIAGNOSIS — F319 Bipolar disorder, unspecified: Secondary | ICD-10-CM | POA: Diagnosis not present

## 2022-01-19 DIAGNOSIS — F319 Bipolar disorder, unspecified: Secondary | ICD-10-CM | POA: Diagnosis not present

## 2022-01-22 DIAGNOSIS — F319 Bipolar disorder, unspecified: Secondary | ICD-10-CM | POA: Diagnosis not present

## 2022-01-24 DIAGNOSIS — F331 Major depressive disorder, recurrent, moderate: Secondary | ICD-10-CM | POA: Diagnosis not present

## 2022-01-25 DIAGNOSIS — F319 Bipolar disorder, unspecified: Secondary | ICD-10-CM | POA: Diagnosis not present

## 2022-01-29 DIAGNOSIS — F319 Bipolar disorder, unspecified: Secondary | ICD-10-CM | POA: Diagnosis not present

## 2022-01-31 DIAGNOSIS — F319 Bipolar disorder, unspecified: Secondary | ICD-10-CM | POA: Diagnosis not present

## 2022-02-05 DIAGNOSIS — F329 Major depressive disorder, single episode, unspecified: Secondary | ICD-10-CM | POA: Diagnosis not present

## 2022-02-08 DIAGNOSIS — F319 Bipolar disorder, unspecified: Secondary | ICD-10-CM | POA: Diagnosis not present

## 2022-02-09 DIAGNOSIS — F331 Major depressive disorder, recurrent, moderate: Secondary | ICD-10-CM | POA: Diagnosis not present

## 2022-02-12 DIAGNOSIS — F319 Bipolar disorder, unspecified: Secondary | ICD-10-CM | POA: Diagnosis not present

## 2022-02-13 DIAGNOSIS — F331 Major depressive disorder, recurrent, moderate: Secondary | ICD-10-CM | POA: Diagnosis not present

## 2022-02-14 DIAGNOSIS — F319 Bipolar disorder, unspecified: Secondary | ICD-10-CM | POA: Diagnosis not present

## 2022-02-16 DIAGNOSIS — F331 Major depressive disorder, recurrent, moderate: Secondary | ICD-10-CM | POA: Diagnosis not present

## 2022-02-18 DIAGNOSIS — E785 Hyperlipidemia, unspecified: Secondary | ICD-10-CM | POA: Diagnosis not present

## 2022-02-21 DIAGNOSIS — F319 Bipolar disorder, unspecified: Secondary | ICD-10-CM | POA: Diagnosis not present

## 2022-02-22 DIAGNOSIS — D225 Melanocytic nevi of trunk: Secondary | ICD-10-CM | POA: Diagnosis not present

## 2022-02-22 DIAGNOSIS — D485 Neoplasm of uncertain behavior of skin: Secondary | ICD-10-CM | POA: Diagnosis not present

## 2022-02-22 DIAGNOSIS — L814 Other melanin hyperpigmentation: Secondary | ICD-10-CM | POA: Diagnosis not present

## 2022-02-22 DIAGNOSIS — L821 Other seborrheic keratosis: Secondary | ICD-10-CM | POA: Diagnosis not present

## 2022-02-22 DIAGNOSIS — L738 Other specified follicular disorders: Secondary | ICD-10-CM | POA: Diagnosis not present

## 2022-02-22 DIAGNOSIS — L918 Other hypertrophic disorders of the skin: Secondary | ICD-10-CM | POA: Diagnosis not present

## 2022-02-26 DIAGNOSIS — F319 Bipolar disorder, unspecified: Secondary | ICD-10-CM | POA: Diagnosis not present

## 2022-02-27 DIAGNOSIS — F331 Major depressive disorder, recurrent, moderate: Secondary | ICD-10-CM | POA: Diagnosis not present

## 2022-02-28 DIAGNOSIS — M9903 Segmental and somatic dysfunction of lumbar region: Secondary | ICD-10-CM | POA: Diagnosis not present

## 2022-02-28 DIAGNOSIS — M5417 Radiculopathy, lumbosacral region: Secondary | ICD-10-CM | POA: Diagnosis not present

## 2022-02-28 DIAGNOSIS — F319 Bipolar disorder, unspecified: Secondary | ICD-10-CM | POA: Diagnosis not present

## 2022-02-28 DIAGNOSIS — M9901 Segmental and somatic dysfunction of cervical region: Secondary | ICD-10-CM | POA: Diagnosis not present

## 2022-02-28 DIAGNOSIS — M5032 Other cervical disc degeneration, mid-cervical region, unspecified level: Secondary | ICD-10-CM | POA: Diagnosis not present

## 2022-02-28 DIAGNOSIS — M9902 Segmental and somatic dysfunction of thoracic region: Secondary | ICD-10-CM | POA: Diagnosis not present

## 2022-03-01 DIAGNOSIS — M5032 Other cervical disc degeneration, mid-cervical region, unspecified level: Secondary | ICD-10-CM | POA: Diagnosis not present

## 2022-03-01 DIAGNOSIS — F331 Major depressive disorder, recurrent, moderate: Secondary | ICD-10-CM | POA: Diagnosis not present

## 2022-03-01 DIAGNOSIS — M9902 Segmental and somatic dysfunction of thoracic region: Secondary | ICD-10-CM | POA: Diagnosis not present

## 2022-03-01 DIAGNOSIS — M5417 Radiculopathy, lumbosacral region: Secondary | ICD-10-CM | POA: Diagnosis not present

## 2022-03-01 DIAGNOSIS — M9903 Segmental and somatic dysfunction of lumbar region: Secondary | ICD-10-CM | POA: Diagnosis not present

## 2022-03-01 DIAGNOSIS — M9901 Segmental and somatic dysfunction of cervical region: Secondary | ICD-10-CM | POA: Diagnosis not present

## 2022-03-06 DIAGNOSIS — M9901 Segmental and somatic dysfunction of cervical region: Secondary | ICD-10-CM | POA: Diagnosis not present

## 2022-03-06 DIAGNOSIS — M5032 Other cervical disc degeneration, mid-cervical region, unspecified level: Secondary | ICD-10-CM | POA: Diagnosis not present

## 2022-03-06 DIAGNOSIS — M5417 Radiculopathy, lumbosacral region: Secondary | ICD-10-CM | POA: Diagnosis not present

## 2022-03-06 DIAGNOSIS — M9902 Segmental and somatic dysfunction of thoracic region: Secondary | ICD-10-CM | POA: Diagnosis not present

## 2022-03-06 DIAGNOSIS — F331 Major depressive disorder, recurrent, moderate: Secondary | ICD-10-CM | POA: Diagnosis not present

## 2022-03-06 DIAGNOSIS — M9903 Segmental and somatic dysfunction of lumbar region: Secondary | ICD-10-CM | POA: Diagnosis not present

## 2022-03-08 DIAGNOSIS — M5032 Other cervical disc degeneration, mid-cervical region, unspecified level: Secondary | ICD-10-CM | POA: Diagnosis not present

## 2022-03-08 DIAGNOSIS — M9903 Segmental and somatic dysfunction of lumbar region: Secondary | ICD-10-CM | POA: Diagnosis not present

## 2022-03-08 DIAGNOSIS — M5417 Radiculopathy, lumbosacral region: Secondary | ICD-10-CM | POA: Diagnosis not present

## 2022-03-08 DIAGNOSIS — F319 Bipolar disorder, unspecified: Secondary | ICD-10-CM | POA: Diagnosis not present

## 2022-03-08 DIAGNOSIS — M9902 Segmental and somatic dysfunction of thoracic region: Secondary | ICD-10-CM | POA: Diagnosis not present

## 2022-03-08 DIAGNOSIS — M9901 Segmental and somatic dysfunction of cervical region: Secondary | ICD-10-CM | POA: Diagnosis not present

## 2022-03-12 DIAGNOSIS — F319 Bipolar disorder, unspecified: Secondary | ICD-10-CM | POA: Diagnosis not present

## 2022-03-13 DIAGNOSIS — M9902 Segmental and somatic dysfunction of thoracic region: Secondary | ICD-10-CM | POA: Diagnosis not present

## 2022-03-13 DIAGNOSIS — M5417 Radiculopathy, lumbosacral region: Secondary | ICD-10-CM | POA: Diagnosis not present

## 2022-03-13 DIAGNOSIS — M5032 Other cervical disc degeneration, mid-cervical region, unspecified level: Secondary | ICD-10-CM | POA: Diagnosis not present

## 2022-03-13 DIAGNOSIS — M9901 Segmental and somatic dysfunction of cervical region: Secondary | ICD-10-CM | POA: Diagnosis not present

## 2022-03-13 DIAGNOSIS — M9903 Segmental and somatic dysfunction of lumbar region: Secondary | ICD-10-CM | POA: Diagnosis not present

## 2022-03-14 DIAGNOSIS — F319 Bipolar disorder, unspecified: Secondary | ICD-10-CM | POA: Diagnosis not present

## 2022-03-15 DIAGNOSIS — M9903 Segmental and somatic dysfunction of lumbar region: Secondary | ICD-10-CM | POA: Diagnosis not present

## 2022-03-15 DIAGNOSIS — M9901 Segmental and somatic dysfunction of cervical region: Secondary | ICD-10-CM | POA: Diagnosis not present

## 2022-03-15 DIAGNOSIS — M5032 Other cervical disc degeneration, mid-cervical region, unspecified level: Secondary | ICD-10-CM | POA: Diagnosis not present

## 2022-03-15 DIAGNOSIS — M9902 Segmental and somatic dysfunction of thoracic region: Secondary | ICD-10-CM | POA: Diagnosis not present

## 2022-03-15 DIAGNOSIS — M5417 Radiculopathy, lumbosacral region: Secondary | ICD-10-CM | POA: Diagnosis not present

## 2022-03-19 DIAGNOSIS — M9901 Segmental and somatic dysfunction of cervical region: Secondary | ICD-10-CM | POA: Diagnosis not present

## 2022-03-19 DIAGNOSIS — M5032 Other cervical disc degeneration, mid-cervical region, unspecified level: Secondary | ICD-10-CM | POA: Diagnosis not present

## 2022-03-19 DIAGNOSIS — M5417 Radiculopathy, lumbosacral region: Secondary | ICD-10-CM | POA: Diagnosis not present

## 2022-03-19 DIAGNOSIS — F319 Bipolar disorder, unspecified: Secondary | ICD-10-CM | POA: Diagnosis not present

## 2022-03-19 DIAGNOSIS — M9903 Segmental and somatic dysfunction of lumbar region: Secondary | ICD-10-CM | POA: Diagnosis not present

## 2022-03-19 DIAGNOSIS — M9902 Segmental and somatic dysfunction of thoracic region: Secondary | ICD-10-CM | POA: Diagnosis not present

## 2022-03-21 DIAGNOSIS — M9903 Segmental and somatic dysfunction of lumbar region: Secondary | ICD-10-CM | POA: Diagnosis not present

## 2022-03-21 DIAGNOSIS — M5417 Radiculopathy, lumbosacral region: Secondary | ICD-10-CM | POA: Diagnosis not present

## 2022-03-21 DIAGNOSIS — F319 Bipolar disorder, unspecified: Secondary | ICD-10-CM | POA: Diagnosis not present

## 2022-03-21 DIAGNOSIS — M9901 Segmental and somatic dysfunction of cervical region: Secondary | ICD-10-CM | POA: Diagnosis not present

## 2022-03-21 DIAGNOSIS — M9902 Segmental and somatic dysfunction of thoracic region: Secondary | ICD-10-CM | POA: Diagnosis not present

## 2022-03-21 DIAGNOSIS — M5032 Other cervical disc degeneration, mid-cervical region, unspecified level: Secondary | ICD-10-CM | POA: Diagnosis not present

## 2022-03-26 DIAGNOSIS — F319 Bipolar disorder, unspecified: Secondary | ICD-10-CM | POA: Diagnosis not present

## 2022-03-28 DIAGNOSIS — F319 Bipolar disorder, unspecified: Secondary | ICD-10-CM | POA: Diagnosis not present

## 2022-04-02 DIAGNOSIS — M5417 Radiculopathy, lumbosacral region: Secondary | ICD-10-CM | POA: Diagnosis not present

## 2022-04-02 DIAGNOSIS — M9901 Segmental and somatic dysfunction of cervical region: Secondary | ICD-10-CM | POA: Diagnosis not present

## 2022-04-02 DIAGNOSIS — M9902 Segmental and somatic dysfunction of thoracic region: Secondary | ICD-10-CM | POA: Diagnosis not present

## 2022-04-02 DIAGNOSIS — M5032 Other cervical disc degeneration, mid-cervical region, unspecified level: Secondary | ICD-10-CM | POA: Diagnosis not present

## 2022-04-02 DIAGNOSIS — F319 Bipolar disorder, unspecified: Secondary | ICD-10-CM | POA: Diagnosis not present

## 2022-04-02 DIAGNOSIS — M9903 Segmental and somatic dysfunction of lumbar region: Secondary | ICD-10-CM | POA: Diagnosis not present

## 2022-04-04 DIAGNOSIS — F319 Bipolar disorder, unspecified: Secondary | ICD-10-CM | POA: Diagnosis not present

## 2022-04-05 DIAGNOSIS — M5032 Other cervical disc degeneration, mid-cervical region, unspecified level: Secondary | ICD-10-CM | POA: Diagnosis not present

## 2022-04-05 DIAGNOSIS — M9903 Segmental and somatic dysfunction of lumbar region: Secondary | ICD-10-CM | POA: Diagnosis not present

## 2022-04-05 DIAGNOSIS — M5417 Radiculopathy, lumbosacral region: Secondary | ICD-10-CM | POA: Diagnosis not present

## 2022-04-05 DIAGNOSIS — M9901 Segmental and somatic dysfunction of cervical region: Secondary | ICD-10-CM | POA: Diagnosis not present

## 2022-04-05 DIAGNOSIS — M9902 Segmental and somatic dysfunction of thoracic region: Secondary | ICD-10-CM | POA: Diagnosis not present

## 2022-04-09 DIAGNOSIS — M9901 Segmental and somatic dysfunction of cervical region: Secondary | ICD-10-CM | POA: Diagnosis not present

## 2022-04-09 DIAGNOSIS — M9903 Segmental and somatic dysfunction of lumbar region: Secondary | ICD-10-CM | POA: Diagnosis not present

## 2022-04-09 DIAGNOSIS — F319 Bipolar disorder, unspecified: Secondary | ICD-10-CM | POA: Diagnosis not present

## 2022-04-09 DIAGNOSIS — M5417 Radiculopathy, lumbosacral region: Secondary | ICD-10-CM | POA: Diagnosis not present

## 2022-04-09 DIAGNOSIS — M5032 Other cervical disc degeneration, mid-cervical region, unspecified level: Secondary | ICD-10-CM | POA: Diagnosis not present

## 2022-04-09 DIAGNOSIS — M9902 Segmental and somatic dysfunction of thoracic region: Secondary | ICD-10-CM | POA: Diagnosis not present

## 2022-04-10 DIAGNOSIS — E785 Hyperlipidemia, unspecified: Secondary | ICD-10-CM | POA: Diagnosis not present

## 2022-04-11 DIAGNOSIS — M5032 Other cervical disc degeneration, mid-cervical region, unspecified level: Secondary | ICD-10-CM | POA: Diagnosis not present

## 2022-04-11 DIAGNOSIS — M9902 Segmental and somatic dysfunction of thoracic region: Secondary | ICD-10-CM | POA: Diagnosis not present

## 2022-04-11 DIAGNOSIS — M9903 Segmental and somatic dysfunction of lumbar region: Secondary | ICD-10-CM | POA: Diagnosis not present

## 2022-04-11 DIAGNOSIS — M5417 Radiculopathy, lumbosacral region: Secondary | ICD-10-CM | POA: Diagnosis not present

## 2022-04-11 DIAGNOSIS — F319 Bipolar disorder, unspecified: Secondary | ICD-10-CM | POA: Diagnosis not present

## 2022-04-11 DIAGNOSIS — M9901 Segmental and somatic dysfunction of cervical region: Secondary | ICD-10-CM | POA: Diagnosis not present

## 2022-04-16 DIAGNOSIS — M5032 Other cervical disc degeneration, mid-cervical region, unspecified level: Secondary | ICD-10-CM | POA: Diagnosis not present

## 2022-04-16 DIAGNOSIS — M9902 Segmental and somatic dysfunction of thoracic region: Secondary | ICD-10-CM | POA: Diagnosis not present

## 2022-04-16 DIAGNOSIS — M9901 Segmental and somatic dysfunction of cervical region: Secondary | ICD-10-CM | POA: Diagnosis not present

## 2022-04-16 DIAGNOSIS — M5417 Radiculopathy, lumbosacral region: Secondary | ICD-10-CM | POA: Diagnosis not present

## 2022-04-16 DIAGNOSIS — F319 Bipolar disorder, unspecified: Secondary | ICD-10-CM | POA: Diagnosis not present

## 2022-04-16 DIAGNOSIS — M9903 Segmental and somatic dysfunction of lumbar region: Secondary | ICD-10-CM | POA: Diagnosis not present

## 2022-04-17 ENCOUNTER — Other Ambulatory Visit: Payer: Self-pay | Admitting: Obstetrics & Gynecology

## 2022-04-17 DIAGNOSIS — Z1231 Encounter for screening mammogram for malignant neoplasm of breast: Secondary | ICD-10-CM

## 2022-04-18 DIAGNOSIS — M5032 Other cervical disc degeneration, mid-cervical region, unspecified level: Secondary | ICD-10-CM | POA: Diagnosis not present

## 2022-04-18 DIAGNOSIS — F319 Bipolar disorder, unspecified: Secondary | ICD-10-CM | POA: Diagnosis not present

## 2022-04-18 DIAGNOSIS — M5417 Radiculopathy, lumbosacral region: Secondary | ICD-10-CM | POA: Diagnosis not present

## 2022-04-18 DIAGNOSIS — M9901 Segmental and somatic dysfunction of cervical region: Secondary | ICD-10-CM | POA: Diagnosis not present

## 2022-04-18 DIAGNOSIS — M9902 Segmental and somatic dysfunction of thoracic region: Secondary | ICD-10-CM | POA: Diagnosis not present

## 2022-04-18 DIAGNOSIS — M9903 Segmental and somatic dysfunction of lumbar region: Secondary | ICD-10-CM | POA: Diagnosis not present

## 2022-04-23 DIAGNOSIS — M9902 Segmental and somatic dysfunction of thoracic region: Secondary | ICD-10-CM | POA: Diagnosis not present

## 2022-04-23 DIAGNOSIS — M5032 Other cervical disc degeneration, mid-cervical region, unspecified level: Secondary | ICD-10-CM | POA: Diagnosis not present

## 2022-04-23 DIAGNOSIS — M9903 Segmental and somatic dysfunction of lumbar region: Secondary | ICD-10-CM | POA: Diagnosis not present

## 2022-04-23 DIAGNOSIS — M5417 Radiculopathy, lumbosacral region: Secondary | ICD-10-CM | POA: Diagnosis not present

## 2022-04-23 DIAGNOSIS — F319 Bipolar disorder, unspecified: Secondary | ICD-10-CM | POA: Diagnosis not present

## 2022-04-23 DIAGNOSIS — M9901 Segmental and somatic dysfunction of cervical region: Secondary | ICD-10-CM | POA: Diagnosis not present

## 2022-04-24 DIAGNOSIS — M9902 Segmental and somatic dysfunction of thoracic region: Secondary | ICD-10-CM | POA: Diagnosis not present

## 2022-04-24 DIAGNOSIS — M5032 Other cervical disc degeneration, mid-cervical region, unspecified level: Secondary | ICD-10-CM | POA: Diagnosis not present

## 2022-04-24 DIAGNOSIS — M9903 Segmental and somatic dysfunction of lumbar region: Secondary | ICD-10-CM | POA: Diagnosis not present

## 2022-04-24 DIAGNOSIS — M9901 Segmental and somatic dysfunction of cervical region: Secondary | ICD-10-CM | POA: Diagnosis not present

## 2022-04-24 DIAGNOSIS — M5417 Radiculopathy, lumbosacral region: Secondary | ICD-10-CM | POA: Diagnosis not present

## 2022-04-25 DIAGNOSIS — F319 Bipolar disorder, unspecified: Secondary | ICD-10-CM | POA: Diagnosis not present

## 2022-04-26 DIAGNOSIS — M5032 Other cervical disc degeneration, mid-cervical region, unspecified level: Secondary | ICD-10-CM | POA: Diagnosis not present

## 2022-04-26 DIAGNOSIS — M9902 Segmental and somatic dysfunction of thoracic region: Secondary | ICD-10-CM | POA: Diagnosis not present

## 2022-04-26 DIAGNOSIS — M9903 Segmental and somatic dysfunction of lumbar region: Secondary | ICD-10-CM | POA: Diagnosis not present

## 2022-04-26 DIAGNOSIS — M9901 Segmental and somatic dysfunction of cervical region: Secondary | ICD-10-CM | POA: Diagnosis not present

## 2022-04-26 DIAGNOSIS — M5417 Radiculopathy, lumbosacral region: Secondary | ICD-10-CM | POA: Diagnosis not present

## 2022-04-27 DIAGNOSIS — H26493 Other secondary cataract, bilateral: Secondary | ICD-10-CM | POA: Diagnosis not present

## 2022-04-27 DIAGNOSIS — H35373 Puckering of macula, bilateral: Secondary | ICD-10-CM | POA: Diagnosis not present

## 2022-04-27 DIAGNOSIS — H3581 Retinal edema: Secondary | ICD-10-CM | POA: Diagnosis not present

## 2022-04-27 DIAGNOSIS — H15833 Staphyloma posticum, bilateral: Secondary | ICD-10-CM | POA: Diagnosis not present

## 2022-04-30 DIAGNOSIS — F319 Bipolar disorder, unspecified: Secondary | ICD-10-CM | POA: Diagnosis not present

## 2022-04-30 DIAGNOSIS — M5417 Radiculopathy, lumbosacral region: Secondary | ICD-10-CM | POA: Diagnosis not present

## 2022-04-30 DIAGNOSIS — M9902 Segmental and somatic dysfunction of thoracic region: Secondary | ICD-10-CM | POA: Diagnosis not present

## 2022-04-30 DIAGNOSIS — M5032 Other cervical disc degeneration, mid-cervical region, unspecified level: Secondary | ICD-10-CM | POA: Diagnosis not present

## 2022-04-30 DIAGNOSIS — M9903 Segmental and somatic dysfunction of lumbar region: Secondary | ICD-10-CM | POA: Diagnosis not present

## 2022-04-30 DIAGNOSIS — M9901 Segmental and somatic dysfunction of cervical region: Secondary | ICD-10-CM | POA: Diagnosis not present

## 2022-05-01 DIAGNOSIS — R7989 Other specified abnormal findings of blood chemistry: Secondary | ICD-10-CM | POA: Diagnosis not present

## 2022-05-01 DIAGNOSIS — R5383 Other fatigue: Secondary | ICD-10-CM | POA: Diagnosis not present

## 2022-05-01 DIAGNOSIS — E7210 Disorders of sulfur-bearing amino-acid metabolism, unspecified: Secondary | ICD-10-CM | POA: Diagnosis not present

## 2022-05-01 DIAGNOSIS — I7090 Unspecified atherosclerosis: Secondary | ICD-10-CM | POA: Diagnosis not present

## 2022-05-01 DIAGNOSIS — E782 Mixed hyperlipidemia: Secondary | ICD-10-CM | POA: Diagnosis not present

## 2022-05-01 DIAGNOSIS — E559 Vitamin D deficiency, unspecified: Secondary | ICD-10-CM | POA: Diagnosis not present

## 2022-05-02 DIAGNOSIS — M9902 Segmental and somatic dysfunction of thoracic region: Secondary | ICD-10-CM | POA: Diagnosis not present

## 2022-05-02 DIAGNOSIS — M9901 Segmental and somatic dysfunction of cervical region: Secondary | ICD-10-CM | POA: Diagnosis not present

## 2022-05-02 DIAGNOSIS — M9903 Segmental and somatic dysfunction of lumbar region: Secondary | ICD-10-CM | POA: Diagnosis not present

## 2022-05-02 DIAGNOSIS — M5417 Radiculopathy, lumbosacral region: Secondary | ICD-10-CM | POA: Diagnosis not present

## 2022-05-02 DIAGNOSIS — M5032 Other cervical disc degeneration, mid-cervical region, unspecified level: Secondary | ICD-10-CM | POA: Diagnosis not present

## 2022-05-02 DIAGNOSIS — F319 Bipolar disorder, unspecified: Secondary | ICD-10-CM | POA: Diagnosis not present

## 2022-05-02 HISTORY — PX: VITRECTOMY: SHX106

## 2022-05-03 DIAGNOSIS — H35371 Puckering of macula, right eye: Secondary | ICD-10-CM | POA: Diagnosis not present

## 2022-05-03 DIAGNOSIS — H26491 Other secondary cataract, right eye: Secondary | ICD-10-CM | POA: Diagnosis not present

## 2022-05-03 DIAGNOSIS — H3581 Retinal edema: Secondary | ICD-10-CM | POA: Diagnosis not present

## 2022-05-07 DIAGNOSIS — E785 Hyperlipidemia, unspecified: Secondary | ICD-10-CM | POA: Diagnosis not present

## 2022-05-07 DIAGNOSIS — F329 Major depressive disorder, single episode, unspecified: Secondary | ICD-10-CM | POA: Diagnosis not present

## 2022-05-14 DIAGNOSIS — M9903 Segmental and somatic dysfunction of lumbar region: Secondary | ICD-10-CM | POA: Diagnosis not present

## 2022-05-14 DIAGNOSIS — M9901 Segmental and somatic dysfunction of cervical region: Secondary | ICD-10-CM | POA: Diagnosis not present

## 2022-05-14 DIAGNOSIS — M9902 Segmental and somatic dysfunction of thoracic region: Secondary | ICD-10-CM | POA: Diagnosis not present

## 2022-05-14 DIAGNOSIS — M5417 Radiculopathy, lumbosacral region: Secondary | ICD-10-CM | POA: Diagnosis not present

## 2022-05-14 DIAGNOSIS — M5032 Other cervical disc degeneration, mid-cervical region, unspecified level: Secondary | ICD-10-CM | POA: Diagnosis not present

## 2022-05-15 DIAGNOSIS — H35371 Puckering of macula, right eye: Secondary | ICD-10-CM | POA: Diagnosis not present

## 2022-05-16 DIAGNOSIS — F319 Bipolar disorder, unspecified: Secondary | ICD-10-CM | POA: Diagnosis not present

## 2022-05-17 DIAGNOSIS — M9901 Segmental and somatic dysfunction of cervical region: Secondary | ICD-10-CM | POA: Diagnosis not present

## 2022-05-17 DIAGNOSIS — M5417 Radiculopathy, lumbosacral region: Secondary | ICD-10-CM | POA: Diagnosis not present

## 2022-05-17 DIAGNOSIS — M9902 Segmental and somatic dysfunction of thoracic region: Secondary | ICD-10-CM | POA: Diagnosis not present

## 2022-05-17 DIAGNOSIS — M9903 Segmental and somatic dysfunction of lumbar region: Secondary | ICD-10-CM | POA: Diagnosis not present

## 2022-05-17 DIAGNOSIS — M5032 Other cervical disc degeneration, mid-cervical region, unspecified level: Secondary | ICD-10-CM | POA: Diagnosis not present

## 2022-05-21 DIAGNOSIS — M5417 Radiculopathy, lumbosacral region: Secondary | ICD-10-CM | POA: Diagnosis not present

## 2022-05-21 DIAGNOSIS — F319 Bipolar disorder, unspecified: Secondary | ICD-10-CM | POA: Diagnosis not present

## 2022-05-21 DIAGNOSIS — M9902 Segmental and somatic dysfunction of thoracic region: Secondary | ICD-10-CM | POA: Diagnosis not present

## 2022-05-21 DIAGNOSIS — M9901 Segmental and somatic dysfunction of cervical region: Secondary | ICD-10-CM | POA: Diagnosis not present

## 2022-05-21 DIAGNOSIS — M5032 Other cervical disc degeneration, mid-cervical region, unspecified level: Secondary | ICD-10-CM | POA: Diagnosis not present

## 2022-05-21 DIAGNOSIS — M9903 Segmental and somatic dysfunction of lumbar region: Secondary | ICD-10-CM | POA: Diagnosis not present

## 2022-05-23 DIAGNOSIS — M9903 Segmental and somatic dysfunction of lumbar region: Secondary | ICD-10-CM | POA: Diagnosis not present

## 2022-05-23 DIAGNOSIS — M9902 Segmental and somatic dysfunction of thoracic region: Secondary | ICD-10-CM | POA: Diagnosis not present

## 2022-05-23 DIAGNOSIS — M5417 Radiculopathy, lumbosacral region: Secondary | ICD-10-CM | POA: Diagnosis not present

## 2022-05-23 DIAGNOSIS — M9901 Segmental and somatic dysfunction of cervical region: Secondary | ICD-10-CM | POA: Diagnosis not present

## 2022-05-23 DIAGNOSIS — M5032 Other cervical disc degeneration, mid-cervical region, unspecified level: Secondary | ICD-10-CM | POA: Diagnosis not present

## 2022-05-28 DIAGNOSIS — F319 Bipolar disorder, unspecified: Secondary | ICD-10-CM | POA: Diagnosis not present

## 2022-05-30 DIAGNOSIS — F319 Bipolar disorder, unspecified: Secondary | ICD-10-CM | POA: Diagnosis not present

## 2022-05-31 DIAGNOSIS — L820 Inflamed seborrheic keratosis: Secondary | ICD-10-CM | POA: Diagnosis not present

## 2022-05-31 DIAGNOSIS — L738 Other specified follicular disorders: Secondary | ICD-10-CM | POA: Diagnosis not present

## 2022-05-31 DIAGNOSIS — L720 Epidermal cyst: Secondary | ICD-10-CM | POA: Diagnosis not present

## 2022-06-01 DIAGNOSIS — M8580 Other specified disorders of bone density and structure, unspecified site: Secondary | ICD-10-CM | POA: Diagnosis not present

## 2022-06-01 DIAGNOSIS — M1991 Primary osteoarthritis, unspecified site: Secondary | ICD-10-CM | POA: Diagnosis not present

## 2022-06-01 DIAGNOSIS — K219 Gastro-esophageal reflux disease without esophagitis: Secondary | ICD-10-CM | POA: Diagnosis not present

## 2022-06-01 DIAGNOSIS — F32A Depression, unspecified: Secondary | ICD-10-CM | POA: Diagnosis not present

## 2022-06-01 DIAGNOSIS — R7989 Other specified abnormal findings of blood chemistry: Secondary | ICD-10-CM | POA: Diagnosis not present

## 2022-06-01 DIAGNOSIS — F419 Anxiety disorder, unspecified: Secondary | ICD-10-CM | POA: Diagnosis not present

## 2022-06-01 DIAGNOSIS — E785 Hyperlipidemia, unspecified: Secondary | ICD-10-CM | POA: Diagnosis not present

## 2022-06-01 DIAGNOSIS — E669 Obesity, unspecified: Secondary | ICD-10-CM | POA: Diagnosis not present

## 2022-06-01 DIAGNOSIS — I10 Essential (primary) hypertension: Secondary | ICD-10-CM | POA: Diagnosis not present

## 2022-06-01 DIAGNOSIS — J45909 Unspecified asthma, uncomplicated: Secondary | ICD-10-CM | POA: Diagnosis not present

## 2022-06-01 DIAGNOSIS — I700 Atherosclerosis of aorta: Secondary | ICD-10-CM | POA: Diagnosis not present

## 2022-06-01 DIAGNOSIS — Z8781 Personal history of (healed) traumatic fracture: Secondary | ICD-10-CM | POA: Diagnosis not present

## 2022-06-04 DIAGNOSIS — F319 Bipolar disorder, unspecified: Secondary | ICD-10-CM | POA: Diagnosis not present

## 2022-06-05 DIAGNOSIS — I119 Hypertensive heart disease without heart failure: Secondary | ICD-10-CM | POA: Diagnosis not present

## 2022-06-05 DIAGNOSIS — Z0000 Encounter for general adult medical examination without abnormal findings: Secondary | ICD-10-CM | POA: Diagnosis not present

## 2022-06-05 DIAGNOSIS — E7800 Pure hypercholesterolemia, unspecified: Secondary | ICD-10-CM | POA: Diagnosis not present

## 2022-06-05 DIAGNOSIS — Z1331 Encounter for screening for depression: Secondary | ICD-10-CM | POA: Diagnosis not present

## 2022-06-05 DIAGNOSIS — N183 Chronic kidney disease, stage 3 unspecified: Secondary | ICD-10-CM | POA: Diagnosis not present

## 2022-06-06 DIAGNOSIS — F319 Bipolar disorder, unspecified: Secondary | ICD-10-CM | POA: Diagnosis not present

## 2022-06-11 ENCOUNTER — Other Ambulatory Visit: Payer: Self-pay | Admitting: Internal Medicine

## 2022-06-11 DIAGNOSIS — M810 Age-related osteoporosis without current pathological fracture: Secondary | ICD-10-CM

## 2022-06-11 DIAGNOSIS — E785 Hyperlipidemia, unspecified: Secondary | ICD-10-CM | POA: Diagnosis not present

## 2022-06-14 DIAGNOSIS — Z01419 Encounter for gynecological examination (general) (routine) without abnormal findings: Secondary | ICD-10-CM | POA: Diagnosis not present

## 2022-06-14 DIAGNOSIS — Z90711 Acquired absence of uterus with remaining cervical stump: Secondary | ICD-10-CM | POA: Diagnosis not present

## 2022-06-14 DIAGNOSIS — Z1231 Encounter for screening mammogram for malignant neoplasm of breast: Secondary | ICD-10-CM | POA: Diagnosis not present

## 2022-06-14 DIAGNOSIS — Z01411 Encounter for gynecological examination (general) (routine) with abnormal findings: Secondary | ICD-10-CM | POA: Diagnosis not present

## 2022-06-14 DIAGNOSIS — R6882 Decreased libido: Secondary | ICD-10-CM | POA: Diagnosis not present

## 2022-06-14 DIAGNOSIS — Z124 Encounter for screening for malignant neoplasm of cervix: Secondary | ICD-10-CM | POA: Diagnosis not present

## 2022-06-14 DIAGNOSIS — N952 Postmenopausal atrophic vaginitis: Secondary | ICD-10-CM | POA: Diagnosis not present

## 2022-06-14 DIAGNOSIS — Z6834 Body mass index (BMI) 34.0-34.9, adult: Secondary | ICD-10-CM | POA: Diagnosis not present

## 2022-06-18 DIAGNOSIS — F319 Bipolar disorder, unspecified: Secondary | ICD-10-CM | POA: Diagnosis not present

## 2022-06-21 DIAGNOSIS — F319 Bipolar disorder, unspecified: Secondary | ICD-10-CM | POA: Diagnosis not present

## 2022-06-27 DIAGNOSIS — H35371 Puckering of macula, right eye: Secondary | ICD-10-CM | POA: Diagnosis not present

## 2022-06-27 DIAGNOSIS — Z9889 Other specified postprocedural states: Secondary | ICD-10-CM | POA: Diagnosis not present

## 2022-06-27 DIAGNOSIS — F319 Bipolar disorder, unspecified: Secondary | ICD-10-CM | POA: Diagnosis not present

## 2022-08-15 ENCOUNTER — Ambulatory Visit (INDEPENDENT_AMBULATORY_CARE_PROVIDER_SITE_OTHER): Payer: Medicare Other | Admitting: Internal Medicine

## 2022-08-15 ENCOUNTER — Encounter: Payer: Self-pay | Admitting: Internal Medicine

## 2022-08-15 VITALS — BP 130/98 | HR 84 | Ht 61.5 in | Wt 189.5 lb

## 2022-08-15 DIAGNOSIS — R1310 Dysphagia, unspecified: Secondary | ICD-10-CM | POA: Diagnosis not present

## 2022-08-15 DIAGNOSIS — K219 Gastro-esophageal reflux disease without esophagitis: Secondary | ICD-10-CM

## 2022-08-15 DIAGNOSIS — R109 Unspecified abdominal pain: Secondary | ICD-10-CM | POA: Diagnosis not present

## 2022-08-15 MED ORDER — DEXLANSOPRAZOLE 60 MG PO CPDR: 60.0000 mg | DELAYED_RELEASE_CAPSULE | Freq: Every day | ORAL | 3 refills | Status: DC

## 2022-08-15 NOTE — Patient Instructions (Signed)
_______________________________________________________  If your blood pressure at your visit was 140/90 or greater, please contact your primary care physician to follow up on this.  _______________________________________________________  If you are age 61 or older, your body mass index should be between 23-30. Your Body mass index is 35.23 kg/m. If this is out of the aforementioned range listed, please consider follow up with your Primary Care Provider.  If you are age 92 or younger, your body mass index should be between 19-25. Your Body mass index is 35.23 kg/m. If this is out of the aformentioned range listed, please consider follow up with your Primary Care Provider.   ________________________________________________________  The [facility] providers would like to encourage you to use [facility] to communicate with providers for non-urgent requests or questions.  Due to long hold times on the telephone, sending your provider a message by [facility] may be a faster and more efficient way to get a response.  Please allow 48 business hours for a response.  Please remember that this is for non-urgent requests.  _______________________________________________________  We have sent the following medications to your pharmacy for you to pick up at your convenience:  Dexilant 32m.  [facility]/2022 at 9:30am. Please arrive 30 minutes prior to your appointment for registration. Make certain not to have anything to eat or drink 6 hours prior to your appointment. Should you need to reschedule your appointment, please contact radiology at [phone] This test typically takes about 30 minutes to perform.  You have been scheduled for an endoscopy. Please follow written instructions given to you at your visit today. If you use inhalers (even only as needed), please bring them with you on the day of your  procedure.

## 2022-08-15 NOTE — Progress Notes (Signed)
HISTORY OF PRESENT ILLNESS:  Tamara [name] is a 62 y.o. female with past medical history as listed below.  GI issues include a history of recurrent diverticulitis complicated by large diverticular abscess eventually leading to elective sigmoid colectomy after being treated with percutaneous drainage and antibiotics.  She also has a history of GERD for which she takes Dexilant.  Last colonoscopy and upper endoscopy 2018.  Patient presents today with chief complaint of epigastric discomfort and dysphagia.  She reports to me that she is currently taking Dexilant 30 mg daily.  She will experience breakthrough symptoms for which she will take another dose later in the day.  Over the past year she has had intermittent dysphagia to pills and meats.  She points to the cervical esophagus.  Also she is noted problems with epigastric discomfort nausea which occur weekly over the past 6 to 8 months.  May last for a few days.  Described as pressure and bloating.  She has had decrease in appetite with 5 pound weight loss.  She has been under significant stress related to social issues surrounding her oldest daughter.  GI review of systems is otherwise negative.  CT scan December 2021 right lower quadrant abdominal pain revealed no acute abnormalities was noted to have a moderate hiatal hernia.  Last colonoscopy in 2018 revealed diverticulosis and hemorrhoids follow-up in 10 years recommended.  Her last upper endoscopy in 2018 revealed distal esophageal ring and hiatal hernia.  REVIEW OF SYSTEMS:  All non-GI ROS negative unless otherwise stated in HPI except for anxiety, back pain, depression  Past Medical History:  Diagnosis Date   Allergy    Anemia    Anxiety    Asthma    Depression    Diverticulitis    GERD (gastroesophageal reflux disease)    History of short term memory loss    HTN (hypertension)    Hyperlipemia    PTSD (post-traumatic stress disorder)    Shock therapy as cause of abnormal reaction  of patient or of later complication without mention of misadventure at time of procedure     Past Surgical History:  Procedure Laterality Date   ABDOMINAL HYSTERECTOMY     partial   CATARACT EXTRACTION Bilateral 2022   left july, right august   CHOLECYSTECTOMY     COLECTOMY     COLONOSCOPY     IR RADIOLOGIST EVAL & MGMT  07/24/2018   LAPAROSCOPIC SIGMOID COLECTOMY N/A 04/20/2019   Procedure: LAPAROSCOPIC SIGMOID COLECTOMY;  Surgeon: Alphonsa Overall, MD;  Location: [facility];  Service: General;  Laterality: N/A;   OTHER SURGICAL HISTORY     electric shock therapy   TUBAL LIGATION     UPPER GASTROINTESTINAL ENDOSCOPY     UTERINE FIBROID SURGERY     did a c-section cut   VITRECTOMY Right 05/2022    Social History Tamara [name]  reports that she has never smoked. She has never used smokeless tobacco. She reports that she does not drink alcohol and does not use drugs.  family history includes Arrhythmia in her father; COPD in her father; Cancer in her mother; Colon cancer in her paternal uncle; Coronary artery disease (age of onset: 3) in her maternal grandfather; Diabetes in her daughter and paternal grandfather; Hearing loss in her paternal grandfather and paternal grandmother; Heart disease in her father and maternal grandfather; Heart failure in her father; Hyperlipidemia in her brother, father, maternal grandmother, and mother; Hypertension in her father, maternal grandfather, and mother; Stroke in her  father.  Allergies  Allergen Reactions   Darvon [Propoxyphene] Other (See Comments)    Hallucinations    Gabapentin     Unknown reaction, patient cannot remember    Ivp Dye [Iodinated Contrast Media] Anaphylaxis and Swelling   Levaquin [Levofloxacin] Nausea And Vomiting    Patient states she tolerates Cipro   Morphine And Related Itching   Penicillins Rash    **Tolerated cefazolin 07/15/18**  Has patient had a PCN reaction causing immediate rash, facial/tongue/throat swelling,  SOB or lightheadedness with hypotension: yes Has patient had a PCN reaction causing severe rash involving mucus membranes or skin necrosis: yes Has patient had a PCN reaction that required hospitalization: No Has patient had a PCN reaction occurring within the last 10 years: yes If all of the above answers are "NO", then may proceed with Cephalosporin use.    Propoxyphene Hcl Other (See Comments)    hallucination   Rocephin [Ceftriaxone] Rash    Welts; Pt said she tolerates keflex Tolerated cefazolin 07/15/18   Seroquel [Quetiapine Fumerate]     Rectal bleeding, abdominal cramping   Statins Other (See Comments)    Severe mm pain (esp. Arms) >> DOES NOT WANT TO RETRY!   Sulfonamide Derivatives Rash    Welts    Adhesive [Tape]     This is tape as well as adhesive on bandaids.   Dicyclomine     Vision loss in one eye   Fentanyl Other (See Comments)    Headaches   Other     All Anti-Histamines   Oxycodone Other (See Comments)    amnesia   Oxycontin [Oxycodone Hcl]     Amnesia   Robaxin [Methocarbamol]     GI upset    Benadryl [Diphenhydramine] Itching       PHYSICAL EXAMINATION: Vital signs: BP (!) 130/98 (BP Location: Left Arm, Patient Position: Sitting, Cuff Size: Normal)   Pulse 84   Ht 5' 1.5" (1.562 m)   Wt 189 lb 8 oz (86 kg)   BMI 35.23 kg/m   Constitutional: generally well-appearing, no acute distress Psychiatric: alert and oriented x3, cooperative Eyes: extraocular movements intact, anicteric, conjunctiva pink Mouth: oral pharynx moist, no lesions Neck: supple no lymphadenopathy Cardiovascular: heart regular rate and rhythm, no murmur Lungs: clear to auscultation bilaterally Abdomen: soft, nontender, nondistended, no obvious ascites, no peritoneal signs, normal bowel sounds, no organomegaly Rectal: Omitted Extremities: no clubbing, cyanosis, or lower extremity edema bilaterally Skin: no lesions on visible extremities Neuro: No focal deficits.  Cranial  nerves intact  ASSESSMENT:  1.  GERD.  Experiencing breakthrough symptoms 2.  Intermittent solid food and pill dysphagia.  Rule out peptic stricture.  Ring on prior endoscopy in 2018 3.  Epigastric discomfort as described.  May be related to GERD.  May be related to situational stress. 4.  History of complicated diverticulitis status post sigmoid colectomy 5.  Last colonoscopy 2018 6.  Status post cholecystectomy  PLAN:  1.  Reflux precautions 2.  Increase Dexilant to 60 mg daily 3.  Abdominal ultrasound to evaluate abdominal discomfort and bloating 4.  Schedule upper endoscopy with probable esophageal dilation.  The nature of the procedure, as well as the risks, benefits, and alternatives were carefully and thoroughly reviewed with the patient. Ample time for discussion and questions allowed. The patient understood, was satisfied, and agreed to proceed. 5.  Additional recommendations after the above 6.  Routine repeat screening colonoscopy 2028 A total time of 45 minutes was spent preparing to see the  patient, reviewing data, obtaining comprehensive history, performing medically appropriate physical examination, counseling and educating the patient regarding the above listed issues, ordering advanced therapeutic endoscopic procedure, ordering advanced radiology study, directing medical therapy, and documenting clinical information in the health record.

## 2022-08-24 ENCOUNTER — Ambulatory Visit (HOSPITAL_COMMUNITY)
Admission: RE | Admit: 2022-08-24 | Discharge: 2022-08-24 | Disposition: A | Discharge status: Home / Self Care | Payer: Medicare Other | Source: Ambulatory Visit | Attending: Internal Medicine | Admitting: Internal Medicine

## 2022-08-24 ENCOUNTER — Ambulatory Visit (HOSPITAL_COMMUNITY): Payer: Medicare Other

## 2022-08-24 DIAGNOSIS — R109 Unspecified abdominal pain: Secondary | ICD-10-CM | POA: Diagnosis present

## 2022-08-24 DIAGNOSIS — R1310 Dysphagia, unspecified: Secondary | ICD-10-CM | POA: Insufficient documentation

## 2022-08-24 DIAGNOSIS — K219 Gastro-esophageal reflux disease without esophagitis: Secondary | ICD-10-CM | POA: Insufficient documentation

## 2022-08-28 ENCOUNTER — Other Ambulatory Visit (HOSPITAL_COMMUNITY): Payer: Medicare Other

## 2022-09-04 ENCOUNTER — Telehealth: Payer: Self-pay | Admitting: Internal Medicine

## 2022-09-04 NOTE — Telephone Encounter (Signed)
Spoke with the patient who said her eye doctor told her to check with Dr. Henrene Pastor.  Asked Osvaldo Angst who said from an anesthesia perspective there should be no problem, which I relayed to Patient.

## 2022-09-04 NOTE — Telephone Encounter (Signed)
Inbound call from patient has procedure on 3/14 , patient is having retina surgery on Monday 3/11. Patient is inquiring if she can proceed with procedure or if she needs to reschedule because they are close together. Please advise.

## 2022-09-04 NOTE — Telephone Encounter (Signed)
Please advise 

## 2022-09-04 NOTE — Telephone Encounter (Signed)
She should check with her eye doctor. If it is okay with her eye doctor, it is okay with me. If it is not okay with her eye doctor, then she should reschedule. Thanks for checking

## 2022-09-12 HISTORY — PX: VITRECTOMY: SHX106

## 2022-09-13 ENCOUNTER — Ambulatory Visit (AMBULATORY_SURGERY_CENTER): Payer: Medicare Other | Admitting: Internal Medicine

## 2022-09-13 ENCOUNTER — Encounter: Payer: Self-pay | Admitting: Internal Medicine

## 2022-09-13 VITALS — BP 128/86 | HR 83 | Temp 97.8°F | Resp 15 | Ht 61.5 in | Wt 189.0 lb

## 2022-09-13 DIAGNOSIS — K219 Gastro-esophageal reflux disease without esophagitis: Secondary | ICD-10-CM

## 2022-09-13 DIAGNOSIS — R1310 Dysphagia, unspecified: Secondary | ICD-10-CM

## 2022-09-13 DIAGNOSIS — K222 Esophageal obstruction: Secondary | ICD-10-CM | POA: Diagnosis not present

## 2022-09-13 MED ORDER — SODIUM CHLORIDE 0.9 % IV SOLN: 500.0000 mL | Freq: Once | INTRAVENOUS | Status: DC

## 2022-09-13 NOTE — Progress Notes (Signed)
Pt's states no medical or surgical changes since previsit or office visit. 

## 2022-09-13 NOTE — Progress Notes (Signed)
Pt resting comfortably. VSS. Airway intact. SBAR complete to RN. All questions answered.   

## 2022-09-13 NOTE — Patient Instructions (Addendum)
-   Post dilation diet. - Continue present medications. - Return to the care of your primary provider. GI follow-up as needed   YOU HAD AN ENDOSCOPIC PROCEDURE TODAY AT [facility]:   Refer to the procedure report that was given to you for any specific questions about what was found during the examination.  If the procedure report does not answer your questions, please call your gastroenterologist to clarify.  If you requested that your care partner not be given the details of your procedure findings, then the procedure report has been included in a sealed envelope for you to review at your convenience later.  YOU SHOULD EXPECT: Some feelings of bloating in the abdomen. Passage of more gas than usual.  Walking can help get rid of the air that was put into your GI tract during the procedure and reduce the bloating. If you had a lower endoscopy (such as a colonoscopy or flexible sigmoidoscopy) you may notice spotting of blood in your stool or on the toilet paper. If you underwent a bowel prep for your procedure, you may not have a normal bowel movement for a few days.  Please Note:  You might notice some irritation and congestion in your nose or some drainage.  This is from the oxygen used during your procedure.  There is no need for concern and it should clear up in a day or so.  SYMPTOMS TO REPORT IMMEDIATELY:  Following upper endoscopy (EGD)  Vomiting of blood or coffee ground material  New chest pain or pain under the shoulder blades  Painful or persistently difficult swallowing  New shortness of breath  Fever of 100F or higher  Black, tarry-looking stools  For urgent or emergent issues, a gastroenterologist can be reached at any hour by calling [phone]. Do not use MyChart messaging for urgent concerns.    DIET:  We do recommend a small meal at first, but then you may proceed to your regular diet.  Drink plenty of fluids but you should avoid alcoholic beverages for  24 hours.  ACTIVITY:  You should plan to take it easy for the rest of today and you should NOT DRIVE or use heavy machinery until tomorrow (because of the sedation medicines used during the test).    FOLLOW UP: Our staff will call the number listed on your records the next business day following your procedure.  We will call around 7:15- 8:00 am to check on you and address any questions or concerns that you may have regarding the information given to you following your procedure. If we do not reach you, we will leave a message.     If any biopsies were taken you will be contacted by phone or by letter within the next 1-3 weeks.  Please call us at [phone] if you have not heard about the biopsies in 3 weeks.    SIGNATURES/CONFIDENTIALITY: You and/or your care partner have signed paperwork which will be entered into your electronic medical record.  These signatures attest to the fact that that the information above on your After Visit Summary has been reviewed and is understood.  Full responsibility of the confidentiality of this discharge information lies with you and/or your care-partner.

## 2022-09-13 NOTE — Progress Notes (Signed)
Called to room to assist during endoscopic procedure.  Patient ID and intended procedure confirmed with present staff. Received instructions for my participation in the procedure from the performing physician.  

## 2022-09-13 NOTE — Op Note (Signed)
[place] Patient Name: Tamara [name] Procedure Date: 09/13/2022 10:55 AM MRN: [id number] Endoscopist: Docia Chuck. Henrene Pastor , MD, [id number] Age: 62 Referring MD:  Date of Birth: [birth year] Gender: Female Account #: [id number] Procedure:                Upper GI endoscopy with balloon dilation of the                            esophagus. 18, 19, 20 mm max Indications:              Dysphagia, Epigastric abdominal pain, Esophageal                            reflux Medicines:                Monitored Anesthesia Care Procedure:                Pre-Anesthesia Assessment:                           - Prior to the procedure, a History and Physical                            was performed, and patient medications and                            allergies were reviewed. The patient's tolerance of                            previous anesthesia was also reviewed. The risks                            and benefits of the procedure and the sedation                            options and risks were discussed with the patient.                            All questions were answered, and informed consent                            was obtained. Prior Anticoagulants: The patient has                            taken no anticoagulant or antiplatelet agents. ASA                            Grade Assessment: II - A patient with mild systemic                            disease. After reviewing the risks and benefits,                            the patient was deemed in satisfactory condition to  undergo the procedure.                           After obtaining informed consent, the endoscope was                            passed under direct vision. Throughout the                            procedure, the patient's blood pressure, pulse, and                            oxygen saturations were monitored continuously. The                            Olympus scope [id number] was introduced  through the                            mouth, and advanced to the second part of duodenum.                            The upper GI endoscopy was accomplished without                            difficulty. The patient tolerated the procedure                            well. Scope In: Scope Out: Findings:                 One benign-appearing, intrinsic moderate stenosis                            was found 35 cm from the incisors. This stenosis                            measured 1.5 cm (inner diameter). A TTS dilator was                            passed through the scope. Dilation with an 18-19-20                            mm balloon dilator was performed to 20 mm.                           The exam of the esophagus was otherwise normal.                           The stomach was normal, save hiatal hernia.                           The examined duodenum was normal.                           The cardia and gastric fundus were  normal on                            retroflexion. Complications:            No immediate complications. Estimated Blood Loss:     Estimated blood loss: none. Impression:               - Benign-appearing esophageal stenosis. Dilated.                           - Normal stomach. Hiatal hernia.                           - Normal examined duodenum.                           - No specimens collected. Recommendation:           1. Patient has a contact number available for                            emergencies. The signs and symptoms of potential                            delayed complications were discussed with the                            patient. Return to normal activities tomorrow.                            Written discharge instructions were provided to the                            patient.                           2. Post dilation diet.                           3. Continue present medications.                           4. Return to the care of your  primary provider. GI                            follow-up as needed Docia Chuck. Henrene Pastor, MD 09/13/2022 11:13:02 AM This report has been signed electronically.

## 2022-09-13 NOTE — Progress Notes (Signed)
HISTORY OF PRESENT ILLNESS:   Tamara [name] is a 62 y.o. female with past medical history as listed below.  GI issues include a history of recurrent diverticulitis complicated by large diverticular abscess eventually leading to elective sigmoid colectomy after being treated with percutaneous drainage and antibiotics.  She also has a history of GERD for which she takes Dexilant.  Last colonoscopy and upper endoscopy 2018.   Patient presents today with chief complaint of epigastric discomfort and dysphagia.  She reports to me that she is currently taking Dexilant 30 mg daily.  She will experience breakthrough symptoms for which she will take another dose later in the day.  Over the past year she has had intermittent dysphagia to pills and meats.  She points to the cervical esophagus.  Also she is noted problems with epigastric discomfort nausea which occur weekly over the past 6 to 8 months.  May last for a few days.  Described as pressure and bloating.  She has had decrease in appetite with 5 pound weight loss.  She has been under significant stress related to social issues surrounding her oldest daughter.  GI review of systems is otherwise negative.  CT scan December 2021 right lower quadrant abdominal pain revealed no acute abnormalities was noted to have a moderate hiatal hernia.  Last colonoscopy in 2018 revealed diverticulosis and hemorrhoids follow-up in 10 years recommended.  Her last upper endoscopy in 2018 revealed distal esophageal ring and hiatal hernia.   REVIEW OF SYSTEMS:   All non-GI ROS negative unless otherwise stated in HPI except for anxiety, back pain, depression       Past Medical History:  Diagnosis Date   Allergy     Anemia     Anxiety     Asthma     Depression     Diverticulitis     GERD (gastroesophageal reflux disease)     History of short term memory loss     HTN (hypertension)     Hyperlipemia     PTSD (post-traumatic stress disorder)     Shock therapy as cause  of abnormal reaction of patient or of later complication without mention of misadventure at time of procedure             Past Surgical History:  Procedure Laterality Date   ABDOMINAL HYSTERECTOMY        partial   CATARACT EXTRACTION Bilateral 2022    left july, right august   CHOLECYSTECTOMY       COLECTOMY       COLONOSCOPY       IR RADIOLOGIST EVAL & MGMT   07/24/2018   LAPAROSCOPIC SIGMOID COLECTOMY N/A 04/20/2019    Procedure: LAPAROSCOPIC SIGMOID COLECTOMY;  Surgeon: Alphonsa Overall, MD;  Location: [facility];  Service: General;  Laterality: N/A;   OTHER SURGICAL HISTORY        electric shock therapy   TUBAL LIGATION       UPPER GASTROINTESTINAL ENDOSCOPY       UTERINE FIBROID SURGERY        did a c-section cut   VITRECTOMY Right 05/2022      Social History Sharonann Pollick  reports that she has never smoked. She has never used smokeless tobacco. She reports that she does not drink alcohol and does not use drugs.   family history includes Arrhythmia in her father; COPD in her father; Cancer in her mother; Colon cancer in her paternal uncle; Coronary artery disease (age of onset: 18)  in her maternal grandfather; Diabetes in her daughter and paternal grandfather; Hearing loss in her paternal grandfather and paternal grandmother; Heart disease in her father and maternal grandfather; Heart failure in her father; Hyperlipidemia in her brother, father, maternal grandmother, and mother; Hypertension in her father, maternal grandfather, and mother; Stroke in her father.        Allergies  Allergen Reactions   Darvon [Propoxyphene] Other (See Comments)      Hallucinations     Gabapentin        Unknown reaction, patient cannot remember    Ivp Dye [Iodinated Contrast Media] Anaphylaxis and Swelling   Levaquin [Levofloxacin] Nausea And Vomiting      Patient states she tolerates Cipro   Morphine And Related Itching   Penicillins Rash      **Tolerated cefazolin 07/15/18**   Has patient  had a PCN reaction causing immediate rash, facial/tongue/throat swelling, SOB or lightheadedness with hypotension: yes Has patient had a PCN reaction causing severe rash involving mucus membranes or skin necrosis: yes Has patient had a PCN reaction that required hospitalization: No Has patient had a PCN reaction occurring within the last 10 years: yes If all of the above answers are "NO", then may proceed with Cephalosporin use.     Propoxyphene Hcl Other (See Comments)      hallucination   Rocephin [Ceftriaxone] Rash      Welts; Pt said she tolerates keflex Tolerated cefazolin 07/15/18   Seroquel [Quetiapine Fumerate]        Rectal bleeding, abdominal cramping   Statins Other (See Comments)      Severe mm pain (esp. Arms) >> DOES NOT WANT TO RETRY!   Sulfonamide Derivatives Rash      Welts     Adhesive [Tape]        This is tape as well as adhesive on bandaids.   Dicyclomine        Vision loss in one eye   Fentanyl Other (See Comments)      Headaches   Other        All Anti-Histamines   Oxycodone Other (See Comments)      amnesia   Oxycontin [Oxycodone Hcl]        Amnesia   Robaxin [Methocarbamol]        GI upset     Benadryl [Diphenhydramine] Itching          PHYSICAL EXAMINATION: Vital signs: BP (!) 130/98 (BP Location: Left Arm, Patient Position: Sitting, Cuff Size: Normal)   Pulse 84   Ht 5' 1.5" (1.562 m)   Wt 189 lb 8 oz (86 kg)   BMI 35.23 kg/m   Constitutional: generally well-appearing, no acute distress Psychiatric: alert and oriented x3, cooperative Eyes: extraocular movements intact, anicteric, conjunctiva pink Mouth: oral pharynx moist, no lesions Neck: supple no lymphadenopathy Cardiovascular: heart regular rate and rhythm, no murmur Lungs: clear to auscultation bilaterally Abdomen: soft, nontender, nondistended, no obvious ascites, no peritoneal signs, normal bowel sounds, no organomegaly Rectal: Omitted Extremities: no clubbing, cyanosis, or lower  extremity edema bilaterally Skin: no lesions on visible extremities Neuro: No focal deficits.  Cranial nerves intact   ASSESSMENT:   1.  GERD.  Experiencing breakthrough symptoms 2.  Intermittent solid food and pill dysphagia.  Rule out peptic stricture.  Ring on prior endoscopy in 2018 3.  Epigastric discomfort as described.  May be related to GERD.  May be related to situational stress. 4.  History of complicated diverticulitis status post sigmoid colectomy  5.  Last colonoscopy 2018 6.  Status post cholecystectomy   PLAN:   1.  Reflux precautions 2.  Increase Dexilant to 60 mg daily 3.  Abdominal ultrasound to evaluate abdominal discomfort and bloating 4.  Schedule upper endoscopy with probable esophageal dilation.  The nature of the procedure, as well as the risks, benefits, and alternatives were carefully and thoroughly reviewed with the patient. Ample time for discussion and questions allowed. The patient understood, was satisfied, and agreed to proceed. 5.  Additional recommendations after the above 6.  Routine repeat screening colonoscopy 2028

## 2022-09-13 NOTE — Progress Notes (Signed)
Pt HOB remained elevated with head above her heart for the entire procedure. No coughing noted. + hiccups. Left eye free from pressure. Blood noted in left eye lateral corner same as pre-procedure from pt having a left eye vitrectomy earlier in the week.

## 2022-09-14 ENCOUNTER — Encounter: Payer: Self-pay | Admitting: Internal Medicine

## 2022-09-14 ENCOUNTER — Telehealth: Payer: Self-pay | Admitting: *Deleted

## 2022-09-14 NOTE — Telephone Encounter (Signed)
Post procedure follow up call placed, no answer and unable to leave message as mailbox is full.

## 2022-11-16 ENCOUNTER — Ambulatory Visit: Payer: Medicare Other | Admitting: Orthopedic Surgery

## 2022-12-03 ENCOUNTER — Encounter: Payer: Self-pay | Admitting: Orthopedic Surgery

## 2022-12-03 ENCOUNTER — Ambulatory Visit (INDEPENDENT_AMBULATORY_CARE_PROVIDER_SITE_OTHER): Payer: Medicare Other | Admitting: Orthopedic Surgery

## 2022-12-03 ENCOUNTER — Other Ambulatory Visit (INDEPENDENT_AMBULATORY_CARE_PROVIDER_SITE_OTHER): Payer: Medicare Other

## 2022-12-03 DIAGNOSIS — G8929 Other chronic pain: Secondary | ICD-10-CM

## 2022-12-03 DIAGNOSIS — M25572 Pain in left ankle and joints of left foot: Secondary | ICD-10-CM

## 2022-12-03 DIAGNOSIS — M545 Low back pain, unspecified: Secondary | ICD-10-CM

## 2022-12-03 NOTE — Progress Notes (Signed)
Office Visit Note   Patient: Tamara [name]           Date of Birth: 1960/11/02           MRN: [id number] Visit Date: 12/03/2022 Requested by: Ralene Ok, MD [id number] [place],  [place] [address] PCP: Ralene Ok, MD  Subjective: Chief Complaint  Patient presents with   Left Ankle - Pain   Lower Back - Pain    HPI: Tamara [name] is a 62 y.o. female who presents to the office reporting left ankle pain for 3 months.  Denies any history of injury.  Reports some mechanical symptoms and posterior pain which occurs randomly.  Patient also reports continued low back pain.  Patient had an L4 fracture about 3 years ago.  Continues to have pain in the back no radiation.  She has been seeing a chiropractor along with physical therapist and using massage as well as ibuprofen..                ROS: All systems reviewed are negative as they relate to the chief complaint within the history of present illness.  Patient denies fevers or chills.  Assessment & Plan: Visit Diagnoses:  1. Pain in left ankle and joints of left foot   2. Chronic midline low back pain without sciatica     Plan: Impression is left ankle pain with ultrasound examination demonstrating nothing really definitively structurally abnormal with the tendons.  Imperfect study but we could consider MRI scanning after she returns from MRI scanning on her lumbar spine.  I think it is possible she may have something else going on in her back outside of the healed L4 compression fracture.  That could be treated with epidural steroid injections.  Follow-up after MRI scan lumbar spine for persistent pain following compression fracture.  Follow-Up Instructions: No follow-ups on file.   Orders:  Orders Placed This Encounter  Procedures   XR Ankle Complete Left   XR Lumbar Spine 2-3 Views   MR Lumbar Spine w/o contrast   No orders of the defined types were placed in this encounter.     Procedures: No procedures  performed   Clinical Data: No additional findings.  Objective: Vital Signs: There were no vitals taken for this visit.  Physical Exam:  Constitutional: Patient appears well-developed HEENT:  Head: Normocephalic Eyes:EOM are normal Neck: Normal range of motion Cardiovascular: Normal rate Pulmonary/chest: Effort normal Neurologic: Patient is alert Skin: Skin is warm Psychiatric: Patient has normal mood and affect  Ortho Exam: Ortho exam demonstrates palpable intact nontender anterior to posterior to peroneal and Achilles tendons on the left-hand side.  Pedal pulses palpable.  No masses lymphadenopathy or skin changes noted in that left ankle region.  Ultrasound exam demonstrates no discrete abnormalities of either peroneal tendon.  No subluxation of the tendon with ankle dorsiflexion and eversion.  No nerve root tension signs.  Patient has full active and passive range of motion of both hips.  No paresthesias L1 S1 bilaterally.  Some pain with forward lateral bending.  Specialty Comments:  No specialty comments available.  Imaging: XR Lumbar Spine 2-3 Views  Result Date: 12/03/2022 AP lateral radiographs lumbar spine reviewed.  Old L4 compression fracture is present which is unchanged compared to MRI scan from 2 years ago.  No new acute fracture.  No spondylolisthesis above L4.  XR Ankle Complete Left  Result Date: 12/03/2022 AP lateral mortise radiographs left ankle reviewed.  No acute fracture.  No significant arthritis in the tibiotalar or subtalar joints.  Mortise is intact.    PMFS History: Patient Active Problem List   Diagnosis Date Noted   Atypical chest pain 10/21/2020   Elevated homocysteine 08/05/2020   Diverticulitis large intestine w/o perforation or abscess w/o bleeding 04/20/2019   Abscess    PTSD (post-traumatic stress disorder) 07/12/2018   Diverticulitis of colon 03/15/2018   CKD (chronic kidney disease), stage III (HCC) 03/15/2018   Sepsis (HCC)  03/14/2018   Diverticulosis of colon without hemorrhage 11/09/2016   Multinodular non-toxic goiter 02/16/2015   Migraine 11/05/2013   ASTHMA 08/01/2010   Hyperlipidemia 09/17/2007   OBESITY 09/17/2007   Anxiety 09/17/2007   Depression 09/17/2007   Essential hypertension 09/17/2007   GERD 09/17/2007   Past Medical History:  Diagnosis Date   Allergy    Anemia    Anxiety    Asthma    Depression    Diverticulitis    GERD (gastroesophageal reflux disease)    History of short term memory loss    HTN (hypertension)    Hyperlipemia    PTSD (post-traumatic stress disorder)    Shock therapy as cause of abnormal reaction of patient or of later complication without mention of misadventure at time of procedure     Family History  Problem Relation Age of Onset   Arrhythmia Father    Stroke Father    Hyperlipidemia Father    Heart disease Father    Hypertension Father    COPD Father    Heart failure Father    Coronary artery disease Maternal Grandfather 74       Died 39   Heart disease Maternal Grandfather    Hypertension Maternal Grandfather    Cancer Mother        bone marrow   Hyperlipidemia Mother    Hypertension Mother    Hearing loss Paternal Grandmother    Hearing loss Paternal Grandfather    Diabetes Paternal Grandfather    Diabetes Daughter    Colon cancer Paternal Uncle    Hyperlipidemia Brother    Hyperlipidemia Maternal Grandmother    Esophageal cancer Neg Hx    Rectal cancer Neg Hx    Stomach cancer Neg Hx     Past Surgical History:  Procedure Laterality Date   ABDOMINAL HYSTERECTOMY     partial   CATARACT EXTRACTION Bilateral 2022   left july, right august   CHOLECYSTECTOMY     COLECTOMY     COLONOSCOPY     IR RADIOLOGIST EVAL & MGMT  07/24/2018   LAPAROSCOPIC SIGMOID COLECTOMY N/A 04/20/2019   Procedure: LAPAROSCOPIC SIGMOID COLECTOMY;  Surgeon: Ovidio Kin, MD;  Location: [facility];  Service: General;  Laterality: N/A;   OTHER SURGICAL HISTORY      electric shock therapy   TUBAL LIGATION     UPPER GASTROINTESTINAL ENDOSCOPY     UTERINE FIBROID SURGERY     did a c-section cut   VITRECTOMY Right 05/2022   VITRECTOMY Left 09/12/2022   Social History   Occupational History    Employer: UNEMPLOYED    Comment: Disabled  Tobacco Use   Smoking status: Never   Smokeless tobacco: Never  Vaping Use   Vaping Use: Never used  Substance and Sexual Activity   Alcohol use: No    Alcohol/week: 0.0 standard drinks of alcohol   Drug use: No   Sexual activity: Not on file

## 2022-12-13 ENCOUNTER — Encounter: Payer: Self-pay | Admitting: Orthopedic Surgery

## 2024-07-14 NOTE — ED Provider Notes (Signed)
 "          Chief Complaint  Patient presents with   Anxiety       HPI History of Present Illness The patient presents for evaluation of a cystic mass in the left parotid region.  She reports experiencing anxiety, which she attributes to her current prednisone  treatment. She has been experiencing excessive thirst and urination, leading her to suspect elevated blood sugar levels. She also reports significant weight loss. She is not experiencing any fevers or chills but does report night sweats. She has been managing her anxiety with Xanax , taking a quarter of a tablet approximately an hour prior to this visit.  FAMILY HISTORY - Daughter has type 1 diabetes  History provided by:  Patient     Patient History Medical History[1] Surgical History[2] Family History[3] Social History[4]    Review of Systems Review of Systems  Constitutional:  Positive for diaphoresis.  Genitourinary:  Positive for frequency.  Skin:        Mass       Physical Exam ED Triage Vitals  Temp 07/14/24 1402 99.2 F (37.3 C)  Heart Rate 07/14/24 1402 96  Resp 07/14/24 1402 20  BP 07/14/24 1402 142/78  MAP (mmHg) 07/14/24 1405 95  SpO2 07/14/24 1402 100 %  O2 Device 07/14/24 1402 None (Room air)  O2 Flow Rate (L/min) --   Weight --    Physical Exam ENT: 1.4 x 2.5 x 2.5 cm cystic mass in the left parotid region, separable from the parotid gland. Genitourinary: No lymphadenopathy in the groin area. Physical Exam Vitals and nursing note reviewed.  Constitutional:      General: She is not in acute distress.    Appearance: Normal appearance. She is not ill-appearing or toxic-appearing.  HENT:     Head: Normocephalic and atraumatic.  Eyes:     Conjunctiva/sclera: Conjunctivae normal.  Neck:     Comments: Mass palpable along the angle of the left mandible Cardiovascular:     Rate and Rhythm: Normal rate and regular rhythm.     Pulses: Normal pulses.     Heart sounds: Normal heart  sounds.  Pulmonary:     Effort: Pulmonary effort is normal. No respiratory distress.     Breath sounds: Normal breath sounds.  Abdominal:     General: Abdomen is flat.     Palpations: Abdomen is soft.     Tenderness: There is no abdominal tenderness. There is no guarding or rebound.  Musculoskeletal:        General: No deformity. Normal range of motion.     Cervical back: Normal range of motion and neck supple.  Skin:    General: Skin is warm and dry.  Neurological:     General: No focal deficit present.     Mental Status: She is alert and oriented to person, place, and time.     Cranial Nerves: No cranial nerve deficit or facial asymmetry.     Sensory: No sensory deficit.     Motor: Motor function is intact.  Psychiatric:        Mood and Affect: Mood normal.        Behavior: Behavior normal.        Thought Content: Thought content normal.        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  Normal glucose no glucose urea no clinical indication of diabetes.  Anxiolytics and more information of significantly reduced the patient's concerns about her health care.  Recommend close follow-up with her primary care provider who has been leading the investigation into her neck mass.  There is no signs of any airway or vascular compromise  Problems Addressed: Anxiety about health: complicated acute illness or injury Neck mass: complicated acute illness or injury  Amount and/or Complexity of Data Reviewed External Data Reviewed: radiology.    Details: - Imaging:   - Ultrasound of the soft tissues of the head and neck (07/08/2024):     - 1.4 x 2.5 x 2.5 cm cystic mass in the left parotid region, separable from the parotid gland Labs: ordered. Radiology: ordered. ECG/medicine tests: ordered and independent  interpretation performed.    Details: EKG shows a normal sinus rhythm at 90 bpm with a normal PR interval, QRS duration and QTc  Risk Prescription drug management.    Results      Assessment & Plan   ED Disposition:  Discharge Final diagnoses:  Anxiety about health  Neck mass    ED Prescriptions   None           [1] No past medical history on file. [2] No past surgical history on file. [3] No family history on file. [4]   "

## 2024-08-11 ENCOUNTER — Institutional Professional Consult (permissible substitution) (INDEPENDENT_AMBULATORY_CARE_PROVIDER_SITE_OTHER): Admitting: Otolaryngology
